# Patient Record
Sex: Male | Born: 1945 | Race: White | Hispanic: No | Marital: Married | Smoking: Former smoker
Health system: Southern US, Community
[De-identification: ages and names within clinical notes are randomized; demographics above are authoritative.]

## PROBLEM LIST (undated history)

## (undated) DIAGNOSIS — M1990 Unspecified osteoarthritis, unspecified site: Secondary | ICD-10-CM

## (undated) DIAGNOSIS — C189 Malignant neoplasm of colon, unspecified: Secondary | ICD-10-CM

## (undated) DIAGNOSIS — K5730 Diverticulosis of large intestine without perforation or abscess without bleeding: Secondary | ICD-10-CM

## (undated) DIAGNOSIS — D649 Anemia, unspecified: Secondary | ICD-10-CM

## (undated) DIAGNOSIS — R252 Cramp and spasm: Secondary | ICD-10-CM

## (undated) DIAGNOSIS — Z87442 Personal history of urinary calculi: Secondary | ICD-10-CM

## (undated) DIAGNOSIS — I10 Essential (primary) hypertension: Secondary | ICD-10-CM

## (undated) DIAGNOSIS — N400 Enlarged prostate without lower urinary tract symptoms: Secondary | ICD-10-CM

## (undated) DIAGNOSIS — I700 Atherosclerosis of aorta: Secondary | ICD-10-CM

## (undated) DIAGNOSIS — C801 Malignant (primary) neoplasm, unspecified: Secondary | ICD-10-CM

## (undated) DIAGNOSIS — N189 Chronic kidney disease, unspecified: Secondary | ICD-10-CM

## (undated) HISTORY — PX: APPENDECTOMY: SHX54

## (undated) HISTORY — PX: JOINT REPLACEMENT: SHX530

## (undated) HISTORY — DX: Malignant neoplasm of colon, unspecified: C18.9

## (undated) HISTORY — PX: TOTAL KNEE ARTHROPLASTY: SHX125

## (undated) HISTORY — PX: EYE MUSCLE SURGERY: SHX370

---

## 2004-03-03 ENCOUNTER — Ambulatory Visit: Payer: Self-pay | Admitting: Gastroenterology

## 2006-02-15 ENCOUNTER — Ambulatory Visit: Payer: Self-pay | Admitting: Urology

## 2007-01-06 ENCOUNTER — Ambulatory Visit: Payer: Self-pay | Admitting: Orthopaedic Surgery

## 2009-04-22 ENCOUNTER — Ambulatory Visit: Payer: Self-pay | Admitting: Gastroenterology

## 2010-02-18 ENCOUNTER — Ambulatory Visit: Payer: Self-pay

## 2010-03-24 ENCOUNTER — Ambulatory Visit: Payer: Self-pay | Admitting: Unknown Physician Specialty

## 2010-09-15 ENCOUNTER — Ambulatory Visit: Payer: Self-pay

## 2011-01-19 ENCOUNTER — Ambulatory Visit: Payer: Self-pay | Admitting: Internal Medicine

## 2011-11-25 ENCOUNTER — Ambulatory Visit: Payer: Self-pay | Admitting: General Practice

## 2011-11-25 LAB — URINALYSIS, COMPLETE
Blood: NEGATIVE
Glucose,UR: NEGATIVE mg/dL (ref 0–75)
Ketone: NEGATIVE
Leukocyte Esterase: NEGATIVE
Nitrite: NEGATIVE
Ph: 5 (ref 4.5–8.0)
Protein: NEGATIVE
RBC,UR: 1 /HPF (ref 0–5)
Specific Gravity: 1.021 (ref 1.003–1.030)
Squamous Epithelial: NONE SEEN

## 2011-11-25 LAB — BASIC METABOLIC PANEL
Anion Gap: 7 (ref 7–16)
BUN: 22 mg/dL — ABNORMAL HIGH (ref 7–18)
Calcium, Total: 9.1 mg/dL (ref 8.5–10.1)
Creatinine: 0.8 mg/dL (ref 0.60–1.30)
EGFR (African American): >60
EGFR (Non-African Amer.): >60
Glucose: 90 mg/dL (ref 65–99)
Osmolality: 288 (ref 275–301)
Potassium: 4.4 mmol/L (ref 3.5–5.1)
Sodium: 143 mmol/L (ref 136–145)

## 2011-11-25 LAB — MRSA PCR SCREENING

## 2011-11-25 LAB — PROTIME-INR
INR: 1
Prothrombin Time: 13.4 secs (ref 11.5–14.7)

## 2011-11-25 LAB — CBC
HCT: 42 % (ref 40.0–52.0)
MCH: 31.9 pg (ref 26.0–34.0)
MCHC: 34.1 g/dL (ref 32.0–36.0)
MCV: 94 fL (ref 80–100)
Platelet: 264 10*3/uL (ref 150–440)
RBC: 4.49 10*6/uL (ref 4.40–5.90)
RDW: 13.1 % (ref 11.5–14.5)
WBC: 6.3 10*3/uL (ref 3.8–10.6)

## 2011-11-25 LAB — SEDIMENTATION RATE: Erythrocyte Sed Rate: 1 mm/hr (ref 0–20)

## 2011-11-25 LAB — APTT: Activated PTT: 30.9 secs (ref 23.6–35.9)

## 2011-11-26 LAB — URINE CULTURE

## 2011-12-08 ENCOUNTER — Inpatient Hospital Stay: Payer: Self-pay | Admitting: General Practice

## 2011-12-09 LAB — BASIC METABOLIC PANEL WITH GFR
Anion Gap: 10
BUN: 18 mg/dL
Calcium, Total: 8.2 mg/dL — ABNORMAL LOW
Chloride: 107 mmol/L
Co2: 21 mmol/L
Creatinine: 1.05 mg/dL
EGFR (African American): >60
EGFR (Non-African Amer.): >60
Glucose: 231 mg/dL — ABNORMAL HIGH
Osmolality: 285
Potassium: 3.9 mmol/L
Sodium: 138 mmol/L

## 2011-12-09 LAB — PLATELET COUNT: Platelet: 212 x10 3/mm 3

## 2011-12-09 LAB — HEMOGLOBIN: HGB: 11.7 g/dL — ABNORMAL LOW

## 2011-12-10 LAB — BASIC METABOLIC PANEL
Anion Gap: 9 (ref 7–16)
BUN: 13 mg/dL (ref 7–18)
Calcium, Total: 7.8 mg/dL — ABNORMAL LOW (ref 8.5–10.1)
Chloride: 108 mmol/L — ABNORMAL HIGH (ref 98–107)
Co2: 24 mmol/L (ref 21–32)
Creatinine: 0.78 mg/dL (ref 0.60–1.30)
EGFR (African American): >60
EGFR (Non-African Amer.): >60
Glucose: 95 mg/dL (ref 65–99)
Osmolality: 281 (ref 275–301)
Potassium: 4.1 mmol/L (ref 3.5–5.1)
Sodium: 141 mmol/L (ref 136–145)

## 2011-12-10 LAB — PLATELET COUNT: Platelet: 173 10*3/uL (ref 150–440)

## 2011-12-10 LAB — HEMOGLOBIN: HGB: 10.5 g/dL — ABNORMAL LOW (ref 13.0–18.0)

## 2013-08-10 ENCOUNTER — Ambulatory Visit: Payer: Self-pay | Admitting: Family Medicine

## 2014-04-18 ENCOUNTER — Ambulatory Visit: Payer: Self-pay | Admitting: Gastroenterology

## 2014-05-21 NOTE — Op Note (Signed)
PATIENT NAME:  Leonard Stokes, Leonard Stokes MR#:  [id number] DATE OF BIRTH:  August 13, 1945  DATE OF PROCEDURE:  12/08/2011  PREOPERATIVE DIAGNOSIS: Degenerative arthrosis of the right knee.   POSTOPERATIVE DIAGNOSIS: Degenerative arthrosis of the right knee.   PROCEDURE PERFORMED: Right total knee arthroplasty using computer-assisted navigation.   SURGEON: Laurice Record. Holley Bouche., MD  ASSISTANT: Vance Peper, PA-C (required to maintain retraction throughout the procedure)   ANESTHESIA: Femoral nerve block and spinal.   ESTIMATED BLOOD LOSS: 150 mL.   FLUIDS REPLACED: 1300 mL of crystalloid.   TOURNIQUET TIME: 93 minutes.   DRAINS: Two medium drains to reinfusion system.   SOFT TISSUE RELEASES: Anterior cruciate ligament, posterior cruciate ligament, deep medial collateral ligament, and patellofemoral ligament.   IMPLANTS UTILIZED: DePuy PFC Sigma size 3 posterior stabilized femoral component (cemented), size 4 MBT tibial component (cemented), 35 mm three peg oval dome patella (cemented), and a 10 mm stabilized rotating platform polyethylene insert.   INDICATIONS FOR SURGERY: The patient is a 69 year old male who has been seen for complaints of progressive right knee pain. X-rays demonstrated severe degenerative changes in tricompartmental fashion with varus deformity. Did not see any significant improvement despite conservative nonsurgical intervention. After discussion of the risks and benefits of surgical intervention, the patient expressed his understanding of the risks and benefits and agreed with plans for surgical intervention.   PROCEDURE IN DETAIL: Patient was brought into the Operating Room and, after adequate femoral nerve block and spinal anesthesia was achieved, a tourniquet was placed on patient's upper right thigh. Patient's right knee and leg were cleaned and prepped with alcohol and DuraPrep, draped in the usual sterile fashion. A "timeout" was performed as per usual protocol. The right lower  extremity was exsanguinated using Esmarch, and tourniquet was inflated to 300 mmHg. An anterior longitudinal incision was made followed by a standard mid vastus approach. A large effusion was evacuated. The deep fibers of the medial collateral ligament were elevated in subperiosteal fashion off of the medial flare of the tibia so as to maintain a continuous soft tissue sleeve. The patella was subluxed laterally and the patellofemoral ligament was incised. Inspection of the knee demonstrated severe degenerative changes in tricompartmental fashion. There was full thickness loss of articular cartilage to the medial compartment. Prominent osteophytes were debrided using rongeur. Anterior and posterior cruciate ligaments were excised. Two 4.0 mm Schanz pins were inserted into the femur and into the tibia for attachment of the ray of trackers used for computer-assisted navigation. Hip center was identified using circumduction technique. Distal landmarks were mapped using computer. Distal femur and proximal tibia were mapped using computer. Distal femoral cutting guide was positioned using computer-assisted navigation so as to achieve a 5 degree distal valgus cut. Cut was performed and verified using the computer. The distal femur was sized and it was felt that a size 3 femoral component was appropriate. Size 3 cutting guide was positioned and the anterior cut was performed and verified using the computer. This was followed by completion of the posterior and chamfer cuts. Femoral cutting guide for the central box was then positioned and the central box cut was performed.   Attention was then directed to the proximal tibia. Medial and lateral menisci were excised. The extramedullary tibial cutting guide was positioned using computer-assisted navigation so as to achieve 0 degree varus valgus alignment and 0 degree posterior slope. Cut was performed and verified using the computer. Proximal tibia was sized and it was felt  that a size 4  tibial tray was appropriate. Tibial and femoral trials were inserted followed by insertion of a 10 mm polyethylene insert. Excellent mediolateral soft tissue balancing was appreciated both in extension and in flexion. Finally, the patella was cut and prepared so as to accommodate a 35 mm three peg oval dome patella. Patellar trial was placed and the knee was placed through a range of motion with excellent patellar tracking appreciated.   Osteophytes were debrided from the posterior condyles of the femur. The femoral component was removed. Central post hole for the tibial component was reamed followed by insertion of a keel punch. Tibial tray was then removed. Cut surfaces of bone were irrigated with copious amounts of normal aline with antibiotic solution using pulsatile lavage and then suctioned dry. Polymethyl methacrylate cement was prepared in the usual fashion using a vacuum mixer. Cement was applied to the cut surface of the proximal tibia as well as along the undersurface of a size 4 MBT tibial component. Tibial component was positioned and impacted into place. Excess cement was removed using freer elevators. Cement was then applied to the cut surfaces of the femur as well as along the posterior flanges of a size 3 posterior stabilized femoral component. Femoral component was positioned and impacted into place. Excess cement was removed using freer elevators. A 10 mm polyethylene trial was inserted and the knee was brought into full extension with steady axial compression applied. Finally, cement was applied to the backside of a 35 mm three peg oval dome patella and the patellar component was positioned and patellar clamp applied. Excess cement was removed using freer elevators.   After adequate curing of cement, tourniquet was deflated after total tourniquet time of 93 minutes. Hemostasis was achieved using electrocautery. The knee was irrigated with copious amounts of normal saline with  antibiotic solution using pulsatile lavage and then suctioned dry. The knee was inspected for any residual cement debris. 30 mL of 0.25% Marcaine with epinephrine was injected along the posterior capsule. A 10 mm stabilized rotating platform polyethylene insert was inserted and the knee was placed through a range of motion. Excellent patellar tracking was appreciated and excellent mediolateral soft tissue balancing was noted.   Two medium drains were placed in the wound bed and brought out through a separate stab incision to be attached to reinfusion system. The medial parapatellar portion of the incision was reapproximated using interrupted sutures of #1 Vicryl. Subcutaneous tissue was approximated in layers using first #0 Vicryl followed by 2-0 Vicryl. Skin was closed with skin staples. Sterile dressing was applied.   Patient tolerated procedure well. He was transported to the recovery room in stable condition.   ____________________________ Laurice Record. Holley Bouche., MD jph:cms D: 12/08/2011 20:50:42 ET T: 12/09/2011 11:01:25 ET JOB#: [id number]  cc: Laurice Record. Holley Bouche., MD, <Dictator> Laurice Record Holley Bouche MD ELECTRONICALLY SIGNED 12/15/2011 4:41

## 2014-05-21 NOTE — Discharge Summary (Signed)
PATIENT NAME:  Leonard Stokes, Leonard Stokes MR#:  [id number] DATE OF BIRTH:  [birth year]  DATE OF ADMISSION:  12/08/2011 DATE OF DISCHARGE:  12/11/2011  ADMITTING DIAGNOSIS: Degenerative arthrosis of the right knee.   DISCHARGE DIAGNOSIS: Degenerative arthrosis of the right knee.   HISTORY: The patient is a 69 year old who has been followed at [facility] for progression of right knee pain. The patient states that his symptoms began greater than 12 months ago after he did a significant amount of walking during a trip to [place]. He subsequently underwent a right knee arthroscopy for partial medial and lateral meniscectomy performed by Dr. Albesa Seen. The patient continued to have intermittent discomfort to the right knee. He states that the pain was noted to be aggravated with weight-bearing activities. He states that he had some limitation in his distance walking secondary to increased pain. On occasion the patient had reported some near giving way of the knee as well as swelling of the knee. He also had reported some "catching" of the right knee at night. The patient occasionally has been using oxycodone for pain control. The patient had not seen any significant improvement in his condition despite intraarticular cortisone injections. X-rays taken in [place] showed narrowing of the medial cartilage space with associated varus alignment. He was also noted to have osteophyte as well as subchondral sclerosis. After discussion of the risks and benefits of surgical intervention, the patient expressed his understanding of the risks and benefits and agreed with plans for surgical intervention.   PROCEDURE: Right total knee arthroplasty using computer-assisted navigation.   ANESTHESIA: Femoral nerve block with spinal.   SOFT TISSUE RELEASE: Anterior cruciate ligament, posterior cruciate ligament, deep and medial collateral ligament, as well as patellofemoral ligament.   IMPLANTS UTILIZED: DePuy PFC  Sigma size three posterior stabilized femoral component (cemented), size four MBT tibial component (cemented), 35-mm three pegged oval dome patella (cemented), and a 10-mm stabilized rotating platform polyethylene insert.   HOSPITAL COURSE: The patient tolerated the procedure very well. He had no complications following surgery. He was taken to the PAC-U where he was stabilized and then transferred to the orthopedic floor. The patient began receiving anticoagulation therapy of Lovenox 30 mg subcutaneous every 12 hours per anesthesia and pharmacy protocol. He was fitted with TED stockings bilaterally. These were allowed to be removed one hour per eight-hour shift. The right one was applied on day two following removal of the Hemovac and dressing change. The patient was also fitted with the AV-I compression foot pumps bilaterally set at 80 mmHg. There has been no evidence of any deep venous thromboses of the lower extremities. Calves have been nontender. Negative Homans sign. Heels were elevated off the bed using rolled towels.   The patient is denying any chest pain or shortness of breath. Vital signs have been stable. He has been afebrile. Hemodynamically he was stable and no transfusions were given other than the Autovac transfusion given the first six hours postoperatively. Laboratory studies were for the most part within acceptable limits.   Physical therapy was initiated on day one for gait training and transfers. He has done very well. Upon being discharged he was ambulating greater than 200 feet. He was able go up and down four sets of steps. He was independent with bed to chair transfers. Occupational therapy was also initiated on day one for activities of daily living and assistive devices.   The patient's IV, Foley, and Hemovac were discontinued on day two along with dressing  change. The wound was free of any drainage or signs of infection. The Polar Care was reapplied to the surgical leg  maintaining a temperature of 40 to 50 degrees Fahrenheit.   DISPOSITION: The patient is discharged to home in improved stable condition.   DISCHARGE INSTRUCTIONS:  1. He may weight bear as tolerated. Continue using a walker until cleared by physical therapy to go to a quad cane.  2. He will receive Home Health physical therapy for two weeks after which time he will be referred to outpatient physical therapy.  3. He has a follow-up appointment at [facility] in two weeks.  4. He is to continue with TED stockings. These are to be worn during the day but may be removed at night.  5. Continue Polar Care maintaining a temperature of 40 to 50 degrees Fahrenheit. Recommend that he try to use this as much as he can around-the-clock until in the office.  6. He is placed on a regular diet.  7. He will resume his regular medications that he was on prior to admission.  8. A prescription for Lovenox 40 mg subcutaneously daily for 14 days, then discontinue and begin taking one 81-mg enteric-coated aspirin, was given as well as Roxicodone 5 to 10 mg every 4 to 6 hours p.r.n. for pain and Ultram 50 to 100 mg every 4 to 6 hours p.r.n. for pain.   PAST MEDICAL HISTORY:  1. Acid reflux.  2. Seasonal allergies.  3. Chickenpox.  4. Hospitalized for bleeding after colonoscopy.   ____________________________ Vance Peper, PA jrw:bjt D: 12/14/2011 12:31:49 ET T: 12/15/2011 09:59:16 ET JOB#: [id number]  cc: Vance Peper, PA, <Dictator> Rheannon Cerney PA ELECTRONICALLY SIGNED 12/15/2011 20:49

## 2014-09-24 ENCOUNTER — Telehealth: Payer: Self-pay | Admitting: *Deleted

## 2014-09-24 NOTE — Telephone Encounter (Signed)
  Oncology Nurse Navigator Documentation    Navigator Encounter Type: Telephone;Screening (09/24/14 1500)               Notified patient that annual lung cancer screening low dose CT scan is due. Confirmed that patient is within the age range of 55-77, and asymptomatic, (no signs or symptoms of lung cancer). The patient is a former smoker, with a 60 pack year history. Quit date 15 years ago. Shared decision making visit was done 08/10/13. Patient is agreeable to scheduling of CT scan.

## 2014-09-26 ENCOUNTER — Other Ambulatory Visit: Payer: Self-pay | Admitting: Family Medicine

## 2014-09-26 DIAGNOSIS — Z87891 Personal history of nicotine dependence: Secondary | ICD-10-CM | POA: Insufficient documentation

## 2014-10-02 ENCOUNTER — Ambulatory Visit
Admission: RE | Admit: 2014-10-02 | Discharge: 2014-10-02 | Disposition: A | Discharge status: Home / Self Care | Payer: PPO | Source: Ambulatory Visit | Attending: Family Medicine | Admitting: Family Medicine

## 2014-10-02 DIAGNOSIS — Z87891 Personal history of nicotine dependence: Secondary | ICD-10-CM | POA: Insufficient documentation

## 2014-10-02 DIAGNOSIS — J439 Emphysema, unspecified: Secondary | ICD-10-CM | POA: Insufficient documentation

## 2014-10-02 DIAGNOSIS — I2510 Atherosclerotic heart disease of native coronary artery without angina pectoris: Secondary | ICD-10-CM | POA: Insufficient documentation

## 2014-10-02 DIAGNOSIS — N200 Calculus of kidney: Secondary | ICD-10-CM | POA: Insufficient documentation

## 2014-10-04 ENCOUNTER — Telehealth: Payer: Self-pay | Admitting: *Deleted

## 2014-10-04 NOTE — Telephone Encounter (Signed)
  Oncology Nurse Navigator Documentation    Navigator Encounter Type: Telephone (10/04/14 1000)                      Time Spent with Patient: 15 (10/04/14 1000)   Oncology Nurse Navigator Documentation  Notified patient of LDCT lung cancer screening results of Lung Rads 2  finding with recommendation for 12 month follow up imaging to be further evaluated related to 15 years since smoking cessation. Also notified of incidental finding noted below. Patient verbalizes understanding.  1. Lung-RADS Category 2, benign appearance or behavior. Continue annual screening with low-dose chest CT without contrast in 12 months. Centrilobular emphysema within unchanged subpleural 2 mm right lower lobe nodule. 2. Atherosclerosis, including within the coronary arteries. 3. Left nephrolithiasis.

## 2015-01-07 ENCOUNTER — Other Ambulatory Visit: Payer: Self-pay | Admitting: Orthopedic Surgery

## 2015-01-07 DIAGNOSIS — M2392 Unspecified internal derangement of left knee: Secondary | ICD-10-CM

## 2015-01-29 ENCOUNTER — Ambulatory Visit
Admission: RE | Admit: 2015-01-29 | Discharge: 2015-01-29 | Disposition: A | Discharge status: Home / Self Care | Payer: PPO | Source: Ambulatory Visit | Attending: Orthopedic Surgery | Admitting: Orthopedic Surgery

## 2015-01-29 DIAGNOSIS — X58XXXA Exposure to other specified factors, initial encounter: Secondary | ICD-10-CM | POA: Diagnosis not present

## 2015-01-29 DIAGNOSIS — M2392 Unspecified internal derangement of left knee: Secondary | ICD-10-CM | POA: Insufficient documentation

## 2015-01-29 DIAGNOSIS — S83242A Other tear of medial meniscus, current injury, left knee, initial encounter: Secondary | ICD-10-CM | POA: Insufficient documentation

## 2015-02-07 DIAGNOSIS — S83232D Complex tear of medial meniscus, current injury, left knee, subsequent encounter: Secondary | ICD-10-CM | POA: Diagnosis not present

## 2015-02-07 DIAGNOSIS — M1712 Unilateral primary osteoarthritis, left knee: Secondary | ICD-10-CM | POA: Diagnosis not present

## 2015-03-13 DIAGNOSIS — S83232D Complex tear of medial meniscus, current injury, left knee, subsequent encounter: Secondary | ICD-10-CM | POA: Diagnosis not present

## 2015-03-13 DIAGNOSIS — M1712 Unilateral primary osteoarthritis, left knee: Secondary | ICD-10-CM | POA: Diagnosis not present

## 2015-05-05 ENCOUNTER — Other Ambulatory Visit: Payer: Self-pay | Admitting: Family Medicine

## 2015-05-05 DIAGNOSIS — R221 Localized swelling, mass and lump, neck: Secondary | ICD-10-CM | POA: Diagnosis not present

## 2015-05-08 ENCOUNTER — Ambulatory Visit: Payer: PPO

## 2015-05-12 ENCOUNTER — Ambulatory Visit
Admission: RE | Admit: 2015-05-12 | Discharge: 2015-05-12 | Disposition: A | Discharge status: Home / Self Care | Payer: PPO | Source: Ambulatory Visit | Attending: Family Medicine | Admitting: Family Medicine

## 2015-05-12 DIAGNOSIS — R590 Localized enlarged lymph nodes: Secondary | ICD-10-CM | POA: Insufficient documentation

## 2015-05-12 DIAGNOSIS — R221 Localized swelling, mass and lump, neck: Secondary | ICD-10-CM | POA: Insufficient documentation

## 2015-07-07 DIAGNOSIS — L570 Actinic keratosis: Secondary | ICD-10-CM | POA: Diagnosis not present

## 2015-07-07 DIAGNOSIS — R591 Generalized enlarged lymph nodes: Secondary | ICD-10-CM | POA: Diagnosis not present

## 2015-07-23 DIAGNOSIS — M1712 Unilateral primary osteoarthritis, left knee: Secondary | ICD-10-CM | POA: Diagnosis not present

## 2015-07-23 DIAGNOSIS — S83232D Complex tear of medial meniscus, current injury, left knee, subsequent encounter: Secondary | ICD-10-CM | POA: Diagnosis not present

## 2015-09-26 DIAGNOSIS — L570 Actinic keratosis: Secondary | ICD-10-CM | POA: Diagnosis not present

## 2015-09-26 DIAGNOSIS — D408 Neoplasm of uncertain behavior of other specified male genital organs: Secondary | ICD-10-CM | POA: Diagnosis not present

## 2015-09-26 DIAGNOSIS — D485 Neoplasm of uncertain behavior of skin: Secondary | ICD-10-CM | POA: Diagnosis not present

## 2015-09-26 DIAGNOSIS — L988 Other specified disorders of the skin and subcutaneous tissue: Secondary | ICD-10-CM | POA: Diagnosis not present

## 2015-09-26 DIAGNOSIS — L728 Other follicular cysts of the skin and subcutaneous tissue: Secondary | ICD-10-CM | POA: Diagnosis not present

## 2015-09-26 DIAGNOSIS — X32XXXA Exposure to sunlight, initial encounter: Secondary | ICD-10-CM | POA: Diagnosis not present

## 2015-09-26 DIAGNOSIS — D044 Carcinoma in situ of skin of scalp and neck: Secondary | ICD-10-CM | POA: Diagnosis not present

## 2015-10-01 DIAGNOSIS — D044 Carcinoma in situ of skin of scalp and neck: Secondary | ICD-10-CM | POA: Diagnosis not present

## 2015-12-02 DIAGNOSIS — M1712 Unilateral primary osteoarthritis, left knee: Secondary | ICD-10-CM | POA: Diagnosis not present

## 2015-12-17 DIAGNOSIS — M1712 Unilateral primary osteoarthritis, left knee: Secondary | ICD-10-CM | POA: Diagnosis not present

## 2015-12-17 DIAGNOSIS — M25562 Pain in left knee: Secondary | ICD-10-CM | POA: Diagnosis not present

## 2015-12-30 DIAGNOSIS — Z13220 Encounter for screening for lipoid disorders: Secondary | ICD-10-CM | POA: Diagnosis not present

## 2015-12-30 DIAGNOSIS — Z131 Encounter for screening for diabetes mellitus: Secondary | ICD-10-CM | POA: Diagnosis not present

## 2015-12-30 DIAGNOSIS — Z125 Encounter for screening for malignant neoplasm of prostate: Secondary | ICD-10-CM | POA: Diagnosis not present

## 2016-01-08 DIAGNOSIS — Z0000 Encounter for general adult medical examination without abnormal findings: Secondary | ICD-10-CM | POA: Diagnosis not present

## 2016-01-27 DIAGNOSIS — M1712 Unilateral primary osteoarthritis, left knee: Secondary | ICD-10-CM | POA: Diagnosis not present

## 2016-02-02 DIAGNOSIS — I700 Atherosclerosis of aorta: Secondary | ICD-10-CM

## 2016-02-02 HISTORY — DX: Atherosclerosis of aorta: I70.0

## 2016-02-03 DIAGNOSIS — M1712 Unilateral primary osteoarthritis, left knee: Secondary | ICD-10-CM | POA: Diagnosis not present

## 2016-02-04 DIAGNOSIS — Z85828 Personal history of other malignant neoplasm of skin: Secondary | ICD-10-CM | POA: Diagnosis not present

## 2016-02-04 DIAGNOSIS — D2271 Melanocytic nevi of right lower limb, including hip: Secondary | ICD-10-CM | POA: Diagnosis not present

## 2016-02-04 DIAGNOSIS — D2261 Melanocytic nevi of right upper limb, including shoulder: Secondary | ICD-10-CM | POA: Diagnosis not present

## 2016-02-04 DIAGNOSIS — D225 Melanocytic nevi of trunk: Secondary | ICD-10-CM | POA: Diagnosis not present

## 2016-02-04 DIAGNOSIS — L570 Actinic keratosis: Secondary | ICD-10-CM | POA: Diagnosis not present

## 2016-02-04 DIAGNOSIS — X32XXXA Exposure to sunlight, initial encounter: Secondary | ICD-10-CM | POA: Diagnosis not present

## 2016-02-10 DIAGNOSIS — M1712 Unilateral primary osteoarthritis, left knee: Secondary | ICD-10-CM | POA: Diagnosis not present

## 2016-05-31 ENCOUNTER — Encounter: Payer: Self-pay | Admitting: Urology

## 2016-05-31 ENCOUNTER — Ambulatory Visit (INDEPENDENT_AMBULATORY_CARE_PROVIDER_SITE_OTHER): Payer: PPO | Admitting: Urology

## 2016-05-31 VITALS — BP 143/81 | HR 74 | Ht 72.0 in | Wt 196.0 lb

## 2016-05-31 DIAGNOSIS — R39198 Other difficulties with micturition: Secondary | ICD-10-CM | POA: Diagnosis not present

## 2016-05-31 DIAGNOSIS — N401 Enlarged prostate with lower urinary tract symptoms: Secondary | ICD-10-CM | POA: Diagnosis not present

## 2016-05-31 DIAGNOSIS — N138 Other obstructive and reflux uropathy: Secondary | ICD-10-CM

## 2016-05-31 DIAGNOSIS — N5201 Erectile dysfunction due to arterial insufficiency: Secondary | ICD-10-CM | POA: Diagnosis not present

## 2016-05-31 LAB — URINALYSIS, COMPLETE
Bilirubin, UA: NEGATIVE
GLUCOSE, UA: NEGATIVE
Ketones, UA: NEGATIVE
LEUKOCYTES UA: NEGATIVE
Nitrite, UA: NEGATIVE
PH UA: 5.5 (ref 5.0–7.5)
PROTEIN UA: NEGATIVE
RBC UA: NEGATIVE
SPEC GRAV UA: 1.02 (ref 1.005–1.030)
Urobilinogen, Ur: 0.2 mg/dL (ref 0.2–1.0)

## 2016-05-31 LAB — BLADDER SCAN AMB NON-IMAGING: Scan Result: 109

## 2016-05-31 NOTE — Progress Notes (Addendum)
05/31/2016 9:15 AM   Leonard Stokes 1945-08-09 [id number]  Referring provider: Derinda Late, MD [phone] [facility] [place], [place] [address]  Chief Complaint  Patient presents with  . Erectile Dysfunction    new patient     HPI: Consultation for ED. Pt with trouble getting and maintaining an erection. SHIM 17. He's tried Viagra, Cialis and sildenafil. He also tried daily Cialis. Symptoms worse. He has a good libido. He has two kids.   He has a h/o BPH on tamsulosin. AUASS = 21, QOL mixed. Symptoms of frequency, inc emptying, intermittent flow, weak stream, straining. Noc x 1. Saw Dr. Jacqlyn Larsen and [facility] in past. His Nov 2017 PSA was 3.35. Symptoms stable. His Dad had PCa but died at 9 yo from other causes.   UA clear. PVR 108 mL.    PMH: No past medical history on file.  Surgical History: No past surgical history on file.  Home Medications:  Allergies as of 05/31/2016      Reactions   Codeine Rash      Medication List       Accurate as of 05/31/16  9:15 AM. Always use your most recent med list.          diclofenac sodium 1 % Gel Commonly known as:  VOLTAREN Apply topically 4 (four) times daily.   ferrous sulfate 325 (65 FE) MG tablet Take 325 mg by mouth daily with breakfast.   losartan-hydrochlorothiazide 50-12.5 MG tablet Commonly known as:  HYZAAR   magnesium 30 MG tablet Take 30 mg by mouth 2 (two) times daily.   meloxicam 7.5 MG tablet Commonly known as:  MOBIC Take by mouth.   sildenafil 20 MG tablet Commonly known as:  REVATIO Take 20 mg by mouth 3 (three) times daily.   tamsulosin 0.4 MG Caps capsule Commonly known as:  FLOMAX   TURMERIC CURCUMIN PO Take by mouth.   Vitamin D (Ergocalciferol) 50000 units Caps capsule Commonly known as:  DRISDOL Take 50,000 Units by mouth every 7 (seven) days.   zolpidem 10 MG tablet Commonly known as:  AMBIEN TAKE 1/2 TO 1 TABLET BY MOUTH AT BEDTIMEAS  NEEDED       Allergies:  Allergies  Allergen Reactions  . Codeine Rash    Family History: No family history on file.  Social History:  reports that he quit smoking about 20 years ago. He has never used smokeless tobacco. He reports that he drinks alcohol. He reports that he does not use drugs.  ROS: UROLOGY Frequent Urination?: No Hard to postpone urination?: No Burning/pain with urination?: No Get up at night to urinate?: Yes Leakage of urine?: No Urine stream starts and stops?: Yes Trouble starting stream?: Yes Do you have to strain to urinate?: Yes Blood in urine?: No Urinary tract infection?: No Sexually transmitted disease?: No Injury to kidneys or bladder?: No Painful intercourse?: No Weak stream?: Yes Erection problems?: Yes Penile pain?: No  Gastrointestinal Nausea?: No Vomiting?: No Indigestion/heartburn?: No Diarrhea?: No Constipation?: No  Constitutional Fever: No Night sweats?: No Weight loss?: No Fatigue?: No  Skin Skin rash/lesions?: No Itching?: No  Eyes Blurred vision?: No Double vision?: No  Ears/Nose/Throat Sore throat?: No Sinus problems?: No  Hematologic/Lymphatic Swollen glands?: No Easy bruising?: No  Cardiovascular Leg swelling?: No Chest pain?: No  Respiratory Cough?: No Shortness of breath?: No  Endocrine Excessive thirst?: No  Musculoskeletal Back pain?: No Joint pain?: Yes  Neurological Headaches?:  No Dizziness?: No  Psychologic Depression?: No Anxiety?: No  Physical Exam: BP (!) 143/81   Pulse 74   Ht 6' (1.829 m)   Wt 88.9 kg (196 lb)   BMI 26.58 kg/m   Constitutional:  Alert and oriented, No acute distress. HEENT: [place] AT, moist mucus membranes.  Trachea midline, no masses. Cardiovascular: No clubbing, cyanosis, or edema. Respiratory: Normal respiratory effort, no increased work of breathing. GI: Abdomen is soft, nontender, nondistended, no abdominal masses GU: No CVA tenderness.  Penis -  circumcised, normal Scrotum - normal, a small sebaceous cyst (5 mm) on the left and right posterior. Testicles - descended bilaterally, palpably normal.  DRE- prostate about 50 grams, smooth, no hard area or nodules.  Skin: No rashes, bruises or suspicious lesions. Lymph: No cervical or inguinal adenopathy. Neurologic: Grossly intact, no focal deficits, moving all 4 extremities. Psychiatric: Normal mood and affect.  Laboratory Data: Lab Results  Component Value Date   WBC 6.3 11/25/2011   HGB 10.5 (L) 12/10/2011   HCT 42.0 11/25/2011   MCV 94 11/25/2011   PLT 173 12/10/2011    Lab Results  Component Value Date   CREATININE 0.78 12/10/2011    No results found for: PSA  No results found for: TESTOSTERONE  No results found for: HGBA1C  Urinalysis    Component Value Date/Time   COLORURINE Yellow 11/25/2011 1019   APPEARANCEUR Hazy 11/25/2011 1019   LABSPEC 1.021 11/25/2011 1019   PHURINE 5.0 11/25/2011 1019   GLUCOSEU Negative 11/25/2011 1019   HGBUR Negative 11/25/2011 1019   BILIRUBINUR Negative 11/25/2011 1019   KETONESUR Negative 11/25/2011 1019   PROTEINUR Negative 11/25/2011 1019   NITRITE Negative 11/25/2011 1019   LEUKOCYTESUR Negative 11/25/2011 1019     Assessment & Plan:    1. ED - discussed nature r/b of PDE5i, injections, Muse and VED. He'll try sildenafil 100 mg.   2. BPH with LUTS - discussed nature r/b of alpha blocker, increasing tams to 0.8, adding 5ARI (pt gives platelets and cant take one) and procedures. He'll continue tamsulosin 0.4. Another daily Cialis trial is a good option.  3. PCa screening - PSA up some. Will recheck. DRE normal today.   - Urinalysis, Complete - Bladder Scan (Post Void Residual) in office   No Follow-up on file.  Festus Aloe, [facility] [address], [place], [place] [address] [phone]

## 2016-06-11 DIAGNOSIS — M25562 Pain in left knee: Secondary | ICD-10-CM | POA: Diagnosis not present

## 2016-06-11 DIAGNOSIS — M1712 Unilateral primary osteoarthritis, left knee: Secondary | ICD-10-CM | POA: Diagnosis not present

## 2016-08-01 DIAGNOSIS — N189 Chronic kidney disease, unspecified: Secondary | ICD-10-CM

## 2016-08-01 HISTORY — DX: Chronic kidney disease, unspecified: N18.9

## 2016-08-03 DIAGNOSIS — M1712 Unilateral primary osteoarthritis, left knee: Secondary | ICD-10-CM | POA: Diagnosis not present

## 2016-08-10 ENCOUNTER — Emergency Department
Admission: EM | Admit: 2016-08-10 | Discharge: 2016-08-11 | Disposition: A | Discharge status: Home / Self Care | Payer: PPO | Attending: Emergency Medicine | Admitting: Emergency Medicine

## 2016-08-10 ENCOUNTER — Encounter: Payer: Self-pay | Admitting: Emergency Medicine

## 2016-08-10 DIAGNOSIS — Z87891 Personal history of nicotine dependence: Secondary | ICD-10-CM | POA: Insufficient documentation

## 2016-08-10 DIAGNOSIS — R197 Diarrhea, unspecified: Secondary | ICD-10-CM | POA: Diagnosis not present

## 2016-08-10 DIAGNOSIS — R109 Unspecified abdominal pain: Secondary | ICD-10-CM | POA: Diagnosis not present

## 2016-08-10 DIAGNOSIS — Z79899 Other long term (current) drug therapy: Secondary | ICD-10-CM | POA: Insufficient documentation

## 2016-08-10 DIAGNOSIS — R1031 Right lower quadrant pain: Secondary | ICD-10-CM | POA: Diagnosis present

## 2016-08-10 DIAGNOSIS — N200 Calculus of kidney: Secondary | ICD-10-CM | POA: Diagnosis not present

## 2016-08-10 DIAGNOSIS — M1712 Unilateral primary osteoarthritis, left knee: Secondary | ICD-10-CM | POA: Diagnosis not present

## 2016-08-10 LAB — CBC
HEMATOCRIT: 38.9 % — AB (ref 40.0–52.0)
Hemoglobin: 13.6 g/dL (ref 13.0–18.0)
MCH: 33.1 pg (ref 26.0–34.0)
MCHC: 35.1 g/dL (ref 32.0–36.0)
MCV: 94.6 fL (ref 80.0–100.0)
PLATELETS: 211 10*3/uL (ref 150–440)
RBC: 4.11 MIL/uL — AB (ref 4.40–5.90)
RDW: 13.6 % (ref 11.5–14.5)
WBC: 10.8 10*3/uL — ABNORMAL HIGH (ref 3.8–10.6)

## 2016-08-10 LAB — URINALYSIS, COMPLETE (UACMP) WITH MICROSCOPIC
BACTERIA UA: NONE SEEN
Bilirubin Urine: NEGATIVE
Glucose, UA: NEGATIVE mg/dL
Hgb urine dipstick: NEGATIVE
KETONES UR: 5 mg/dL — AB
Leukocytes, UA: NEGATIVE
Nitrite: NEGATIVE
PH: 6 (ref 5.0–8.0)
PROTEIN: NEGATIVE mg/dL
SQUAMOUS EPITHELIAL / LPF: NONE SEEN
Specific Gravity, Urine: 1.017 (ref 1.005–1.030)

## 2016-08-10 LAB — COMPREHENSIVE METABOLIC PANEL
ALT: 29 U/L (ref 17–63)
AST: 23 U/L (ref 15–41)
Albumin: 4.2 g/dL (ref 3.5–5.0)
Alkaline Phosphatase: 67 U/L (ref 38–126)
Anion gap: 9 (ref 5–15)
BUN: 24 mg/dL — AB (ref 6–20)
CHLORIDE: 101 mmol/L (ref 101–111)
CO2: 28 mmol/L (ref 22–32)
CREATININE: 1.48 mg/dL — AB (ref 0.61–1.24)
Calcium: 9.4 mg/dL (ref 8.9–10.3)
GFR calc non Af Amer: 46 mL/min — ABNORMAL LOW (ref >60)
GFR, EST AFRICAN AMERICAN: 53 mL/min — AB (ref >60)
Glucose, Bld: 131 mg/dL — ABNORMAL HIGH (ref 65–99)
Potassium: 4.7 mmol/L (ref 3.5–5.1)
SODIUM: 138 mmol/L (ref 135–145)
Total Bilirubin: 1 mg/dL (ref 0.3–1.2)
Total Protein: 7.5 g/dL (ref 6.5–8.1)

## 2016-08-10 LAB — LIPASE, BLOOD: LIPASE: 27 U/L (ref 11–51)

## 2016-08-10 MED ORDER — IOPAMIDOL (ISOVUE-300) INJECTION 61%
30.0000 mL | Freq: Once | INTRAVENOUS | Status: AC
Start: 2016-08-11 — End: 2016-08-10
  Administered 2016-08-10: 30 mL via ORAL

## 2016-08-10 MED ORDER — OXYCODONE-ACETAMINOPHEN 5-325 MG PO TABS
1.0000 | ORAL_TABLET | ORAL | Status: AC | PRN
  Administered 2016-08-10 – 2016-08-11 (×2): 1 via ORAL
  Filled 2016-08-10 (×2): qty 1

## 2016-08-10 NOTE — ED Triage Notes (Signed)
Patient to ER for c/o right flank pain with intermittent right lower abd pain. Denies nausea, but reports loss of appetite. Thought it may be constipation, has had two episodes of Magnesium Citrate and feels like he was successful with doses. Patient denies h/o kidney stones.

## 2016-08-10 NOTE — ED Provider Notes (Signed)
[facility] Emergency Department Provider Note  ____________________________________________   I have reviewed the triage vital signs and the nursing notes.   HISTORY  Chief Complaint Abdominal Pain and Flank Pain   History limited by: Not Limited   HPI Leonard Stokes is a 71 y.o. male who presents to the emergency department today because of concerns for abdominal pain. It is located in the right flank. It is been going on for the past few days. It is intermittent. It does radiate towards the front. The first occurred he did have some vomiting however has not had any since. He didn't think he might have been constipated so tried enemas and laxatives at home with good stool output however no resolution of the pain. He denies any fevers. Denies similar symptoms in the past. Denies any travel or unusual ingestion.   History reviewed. No pertinent past medical history.  Patient Active Problem List   Diagnosis Date Noted  . Personal history of tobacco use, presenting hazards to health 09/26/2014    Past Surgical History:  Procedure Laterality Date  . APPENDECTOMY    . TOTAL KNEE ARTHROPLASTY Right     Prior to Admission medications   Medication Sig Start Date End Date Taking? Authorizing Provider  diclofenac sodium (VOLTAREN) 1 % GEL Apply topically 4 (four) times daily.    [provider]  ferrous sulfate 325 (65 FE) MG tablet Take 325 mg by mouth daily with breakfast.    [provider]  losartan-hydrochlorothiazide (HYZAAR) 50-12.5 MG tablet  04/27/16   [provider]  magnesium 30 MG tablet Take 30 mg by mouth 2 (two) times daily.    [provider]  sildenafil (REVATIO) 20 MG tablet Take 20 mg by mouth 3 (three) times daily.    [provider]  tamsulosin (FLOMAX) 0.4 MG CAPS capsule  03/09/16   [provider]  TURMERIC CURCUMIN PO Take by mouth.    [provider]  Vitamin D,  Ergocalciferol, (DRISDOL) 50000 units CAPS capsule Take 50,000 Units by mouth every 7 (seven) days.    [provider]  zolpidem (AMBIEN) 10 MG tablet TAKE 1/2 TO 1 TABLET BY MOUTH AT [facility] NEEDED 03/22/16   [provider]    Allergies Codeine  No family history on file.  Social History Social History  Substance Use Topics  . Smoking status: Former Smoker    Quit date: 05/31/1996  . Smokeless tobacco: Never Used  . Alcohol use Yes    Review of Systems Constitutional: No fever/chills Eyes: No visual changes. ENT: No sore throat. Cardiovascular: Denies chest pain. Respiratory: Denies shortness of breath. Gastrointestinal: Positive for right flank pain. Genitourinary: Negative for dysuria. Musculoskeletal: Negative for back pain. Skin: Negative for rash. Neurological: Negative for headaches, focal weakness or numbness.  ____________________________________________   PHYSICAL EXAM:  VITAL SIGNS: ED Triage Vitals [08/10/16 1958]  Enc Vitals Group     BP (!) 152/83     Pulse Rate 81     Resp 16     Temp 98.5 F (36.9 C)     Temp src      SpO2 100 %     Weight 190 lb (86.2 kg)     Height 6' (1.829 m)     Head Circumference      Peak Flow      Pain Score 8   Constitutional: Alert and oriented. Well appearing and in no distress. Eyes: Conjunctivae are normal.  ENT  Head: Normocephalic and atraumatic.   Nose: No congestion/rhinnorhea.   Mouth/Throat: Mucous membranes are moist.   Neck: No stridor. Hematological/Lymphatic/Immunilogical: No cervical lymphadenopathy. Cardiovascular: Normal rate, regular rhythm.  No murmurs, rubs, or gallops.  Respiratory: Normal respiratory effort without tachypnea nor retractions. Breath sounds are clear and equal bilaterally. No wheezes/rales/rhonchi. Gastrointestinal: Soft and non tender. No rebound. No guarding.  Genitourinary: Deferred Musculoskeletal: Normal range of motion in all extremities.  No lower extremity edema. Neurologic:  Normal speech and language. No gross focal neurologic deficits are appreciated.  Skin:  Skin is warm, dry and intact. No rash noted. Psychiatric: Mood and affect are normal. Speech and behavior are normal. Patient exhibits appropriate insight and judgment.  ____________________________________________    LABS (pertinent positives/negatives)  Labs Reviewed  COMPREHENSIVE METABOLIC PANEL - Abnormal; Notable for the following:       Result Value   Glucose, Bld 131 (*)    BUN 24 (*)    Creatinine, Ser 1.48 (*)    GFR calc non Af Amer 46 (*)    GFR calc Af Amer 53 (*)    All other components within normal limits  CBC - Abnormal; Notable for the following:    WBC 10.8 (*)    RBC 4.11 (*)    HCT 38.9 (*)    All other components within normal limits  URINALYSIS, COMPLETE (UACMP) WITH MICROSCOPIC - Abnormal; Notable for the following:    Color, Urine YELLOW (*)    APPearance CLEAR (*)    Ketones, ur 5 (*)    All other components within normal limits  LIPASE, BLOOD     ____________________________________________   EKG  None  ____________________________________________    RADIOLOGY  CT abd/pel IMPRESSION:  1. A 1 cm right UPJ stone with mild right hydronephrosis.  Correlation with urinalysis recommended to exclude superimposed UTI.  2. A 6 mm nonobstructing left renal upper pole calculus. No  hydronephrosis.  3. Sigmoid diverticulosis.  4. Diarrheal state. Correlation with clinical exam and stool  cultures recommended. No bowel obstruction.  5. Aortic Atherosclerosis (ICD10-I70.0).    ____________________________________________   PROCEDURES  Procedures  ____________________________________________   INITIAL IMPRESSION / ASSESSMENT AND PLAN / ED COURSE  Pertinent labs & imaging results that were available during my care of the patient were reviewed by me and considered in my medical decision making (see chart for  details).  Patient presented to the emergency department today because of concerns for intermittent right flank pain. The patient's CT scan does show a 1 cm kidney stone. The patient's pain is controlled with oral pain medication. Will have patient follow up with urology. Discussed return precautions.  ____________________________________________   FINAL CLINICAL IMPRESSION(S) / ED DIAGNOSES  Final diagnoses:  Kidney stone  Flank pain     Note: This dictation was prepared with Dragon dictation. Any transcriptional errors that result from this process are unintentional     Nance Pear, MD 08/11/16 [phone]

## 2016-08-11 ENCOUNTER — Emergency Department: Payer: PPO

## 2016-08-11 DIAGNOSIS — R197 Diarrhea, unspecified: Secondary | ICD-10-CM | POA: Diagnosis not present

## 2016-08-11 MED ORDER — OXYCODONE-ACETAMINOPHEN 5-325 MG PO TABS: 1.0000 | ORAL_TABLET | ORAL | 0 refills | Status: DC | PRN

## 2016-08-11 MED ORDER — OXYCODONE-ACETAMINOPHEN 5-325 MG PO TABS: 1.0000 | ORAL_TABLET | ORAL | Status: DC | PRN

## 2016-08-11 MED ORDER — IOPAMIDOL (ISOVUE-300) INJECTION 61%
75.0000 mL | Freq: Once | INTRAVENOUS | Status: AC | PRN
  Administered 2016-08-11: 75 mL via INTRAVENOUS

## 2016-08-11 NOTE — Discharge Instructions (Signed)
Please seek medical attention for any high fevers, chest pain, shortness of breath, change in behavior, persistent vomiting, bloody stool or any other new or concerning symptoms.  

## 2016-08-11 NOTE — ED Notes (Signed)
Patient transported to CT 

## 2016-08-17 DIAGNOSIS — M1712 Unilateral primary osteoarthritis, left knee: Secondary | ICD-10-CM | POA: Diagnosis not present

## 2016-08-18 ENCOUNTER — Telehealth: Payer: Self-pay | Admitting: Radiology

## 2016-08-18 ENCOUNTER — Ambulatory Visit
Admission: RE | Admit: 2016-08-18 | Discharge: 2016-08-18 | Disposition: A | Discharge status: Home / Self Care | Payer: PPO | Source: Ambulatory Visit | Attending: Urology | Admitting: Urology

## 2016-08-18 ENCOUNTER — Other Ambulatory Visit: Payer: Self-pay | Admitting: Radiology

## 2016-08-18 ENCOUNTER — Encounter: Payer: Self-pay | Admitting: Urology

## 2016-08-18 ENCOUNTER — Encounter: Payer: Self-pay | Admitting: *Deleted

## 2016-08-18 ENCOUNTER — Ambulatory Visit: Payer: PPO | Admitting: Urology

## 2016-08-18 VITALS — BP 143/70 | HR 94 | Ht 72.0 in | Wt 190.0 lb

## 2016-08-18 DIAGNOSIS — N201 Calculus of ureter: Secondary | ICD-10-CM | POA: Diagnosis not present

## 2016-08-18 DIAGNOSIS — N179 Acute kidney failure, unspecified: Secondary | ICD-10-CM

## 2016-08-18 DIAGNOSIS — N202 Calculus of kidney with calculus of ureter: Secondary | ICD-10-CM | POA: Insufficient documentation

## 2016-08-18 DIAGNOSIS — N1330 Unspecified hydronephrosis: Secondary | ICD-10-CM | POA: Diagnosis not present

## 2016-08-18 DIAGNOSIS — N200 Calculus of kidney: Secondary | ICD-10-CM

## 2016-08-18 LAB — URINALYSIS, COMPLETE
BILIRUBIN UA: NEGATIVE
Glucose, UA: NEGATIVE
Ketones, UA: NEGATIVE
Leukocytes, UA: NEGATIVE
NITRITE UA: NEGATIVE
PH UA: 5.5 (ref 5.0–7.5)
Protein, UA: NEGATIVE
RBC, UA: NEGATIVE
SPEC GRAV UA: 1.02 (ref 1.005–1.030)
UUROB: 0.2 mg/dL (ref 0.2–1.0)

## 2016-08-18 MED ORDER — OXYCODONE-ACETAMINOPHEN 5-325 MG PO TABS: 1.0000 | ORAL_TABLET | ORAL | 0 refills | Status: AC | PRN

## 2016-08-18 NOTE — Progress Notes (Signed)
08/18/2016 8:32 PM   Leonard Stokes [birth year] [id number]  Referring provider: Derinda Late, MD [id number] [facility] [place], [place] [address]  Chief Complaint  Patient presents with  . Nephrolithiasis    HPI: 71 year old male previously followed by practice for BPH and erectile dysfunction who presents today for an ER follow up. He was seen in the emergency room on 08/11/2016 with an obstructing 1 cm right proximal ureteral calculus.   He had a significant leukocytosis and a negative urine in the emergency room. He developed mildly elevated creatinine to 1.48 from his previously normal baseline (0.9).  He denies any overt flank pain today although he has some mild aching in his right flank. This is not severe.  No associated nausea or vomiting. No fevers or chills. No dysuria, gross hematuria, or any other urinary symptoms.  No previous history of kidney stones.  On CT scan, he does have a 6 mm left upper pole stone in addition to the above.  Stone to skin distance 15 cm, [id number].    PMH: Past Medical History:  Diagnosis Date  . Atherosclerosis of aorta ([place]) 2018  . BPH (benign prostatic hyperplasia)   . Chronic kidney disease 08/2016   kidney stone  . Diverticulosis of sigmoid colon     Surgical History: Past Surgical History:  Procedure Laterality Date  . APPENDECTOMY    . JOINT REPLACEMENT Right    TKR  . TOTAL KNEE ARTHROPLASTY Right     Home Medications:  Allergies as of 08/18/2016      Reactions   Codeine Rash      Medication List       Accurate as of 08/18/16  8:32 PM. Always use your most recent med list.          diclofenac sodium 1 % Gel Commonly known as:  VOLTAREN Apply topically 4 (four) times daily.   ferrous sulfate 325 (65 FE) MG tablet Take 325 mg by mouth daily with breakfast.   losartan-hydrochlorothiazide 50-12.5 MG tablet Commonly known as:  HYZAAR   magnesium 30 MG  tablet Take 30 mg by mouth 2 (two) times daily.   oxyCODONE-acetaminophen 5-325 MG tablet Commonly known as:  ROXICET Take 1 tablet by mouth every 4 (four) hours as needed for severe pain.   sildenafil 20 MG tablet Commonly known as:  REVATIO Take 20 mg by mouth 3 (three) times daily.   TURMERIC CURCUMIN PO Take by mouth.   Vitamin D (Ergocalciferol) 50000 units Caps capsule Commonly known as:  DRISDOL Take 50,000 Units by mouth every 7 (seven) days.   zolpidem 10 MG tablet Commonly known as:  AMBIEN TAKE 1/2 TO 1 TABLET BY MOUTH AT BEDTIMEAS NEEDED       Allergies:  Allergies  Allergen Reactions  . Codeine Rash    Family History: History reviewed. No pertinent family history.  Social History:  reports that he quit smoking about 20 years ago. He has never used smokeless tobacco. He reports that he drinks alcohol. He reports that he does not use drugs.  ROS: UROLOGY Frequent Urination?: No Hard to postpone urination?: No Burning/pain with urination?: No Get up at night to urinate?: No Leakage of urine?: No Urine stream starts and stops?: Yes Trouble starting stream?: Yes Do you have to strain to urinate?: No Blood in urine?: No Urinary tract infection?: No Sexually transmitted disease?: No Injury to kidneys or bladder?: No Painful intercourse?: No Weak stream?:  No Erection problems?: Yes Penile pain?: No  Gastrointestinal Nausea?: No Vomiting?: No Indigestion/heartburn?: No Diarrhea?: No Constipation?: No  Constitutional Fever: No Night sweats?: No Weight loss?: No Fatigue?: No  Skin Skin rash/lesions?: No Itching?: No  Eyes Blurred vision?: No Double vision?: No  Ears/Nose/Throat Sore throat?: No Sinus problems?: No  Hematologic/Lymphatic Swollen glands?: No Easy bruising?: No  Cardiovascular Leg swelling?: No Chest pain?: No  Respiratory Cough?: No Shortness of breath?: No  Endocrine Excessive thirst?:  No  Musculoskeletal Back pain?: No Joint pain?: Yes  Neurological Headaches?: No Dizziness?: No  Psychologic Depression?: No Anxiety?: No  Physical Exam: BP (!) 143/70 (BP Location: Left Arm, Patient Position: Sitting, Cuff Size: Normal)   Pulse 94   Ht 6' (1.829 m)   Wt 190 lb (86.2 kg)   BMI 25.77 kg/m   Constitutional:  Alert and oriented, No acute distress. HEENT: [place] AT, moist mucus membranes.  Trachea midline, no masses. Cardiovascular: No clubbing, cyanosis, or edema. Respiratory: Normal respiratory effort, no increased work of breathing. GI: Abdomen is soft, nontender, nondistended, no abdominal masses GU: No CVA tenderness.  Skin: No rashes, bruises or suspicious lesions. Neurologic: Grossly intact, no focal deficits, moving all 4 extremities. Psychiatric: Normal mood and affect.  Laboratory Data: Lab Results  Component Value Date   WBC 10.8 (H) 08/10/2016   HGB 13.6 08/10/2016   HCT 38.9 (L) 08/10/2016   MCV 94.6 08/10/2016   PLT 211 08/10/2016    Lab Results  Component Value Date   CREATININE 1.48 (H) 08/10/2016    Urinalysis Results for orders placed or performed in visit on 08/18/16  Urinalysis, Complete  Result Value Ref Range   Specific Gravity, UA 1.020 1.005 - 1.030   pH, UA 5.5 5.0 - 7.5   Color, UA Yellow Yellow   Appearance Ur Clear Clear   Leukocytes, UA Negative Negative   Protein, UA Negative Negative/Trace   Glucose, UA Negative Negative   Ketones, UA Negative Negative   RBC, UA Negative Negative   Bilirubin, UA Negative Negative   Urobilinogen, Ur 0.2 0.2 - 1.0 mg/dL   Nitrite, UA Negative Negative     Pertinent Imaging: CLINICAL DATA:  71 year old male with right flank pain.  EXAM: CT ABDOMEN AND PELVIS WITH CONTRAST  TECHNIQUE: Multidetector CT imaging of the abdomen and pelvis was performed using the standard protocol following bolus administration of intravenous contrast.  CONTRAST:  32mL ISOVUE-300 IOPAMIDOL  (ISOVUE-300) INJECTION 61%  COMPARISON:  None.  FINDINGS: Lower chest: The visualized lung bases are clear.  No intra-abdominal free air or free fluid.  Hepatobiliary: There is apparent fatty infiltration of the liver. Small scattered hepatic hypodense lesions are not characterized but likely represent cysts or hemangioma. No intrahepatic biliary ductal dilatation. The gallbladder is unremarkable.  Pancreas: Unremarkable. No pancreatic ductal dilatation or surrounding inflammatory changes.  Spleen: Normal in size without focal abnormality.  Adrenals/Urinary Tract: The adrenal glands are unremarkable. There is a 10 mm obstructing right ureteropelvic junction stone with mild right hydronephrosis. There is mild right perinephric stranding. Correlation with urinalysis recommended to exclude superimposed UTI. There is a 6 mm nonobstructing left renal upper pole stone. No hydronephrosis. There is herniation of a portion of the bladder into the right inguinal canal. The bladder is otherwise unremarkable.  Stomach/Bowel: There is sigmoid diverticulosis without active inflammatory changes. Areas of narrowing in the sigmoid colon likely related to chronic inflammation and scarring or related to peristalsis. Liquid stool throughout the colon compatible with diarrheal  state. Correlation with clinical exam and stool cultures recommended. There is no evidence of bowel obstruction or active inflammation. The appendix is not visualized with certainty. No inflammatory changes identified in the right lower quadrant.  Vascular/Lymphatic: There is mild aortoiliac atherosclerotic disease no portal venous gas identified. There is no adenopathy. 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  Reproductive: Mild enlargement of the prostate gland measuring 5 cm in transverse diameter.  Other: None  Musculoskeletal: Bilateral L5 pars defects with grade 1 L5-S1 anterolisthesis. No acute  fracture.  IMPRESSION: 1. A 1 cm right UPJ stone with mild right hydronephrosis. Correlation with urinalysis recommended to exclude superimposed UTI. 2. A 6 mm nonobstructing left renal upper pole calculus. No hydronephrosis. 3. Sigmoid diverticulosis. 4. Diarrheal state. Correlation with clinical exam and stool cultures recommended. No bowel obstruction. 5.  Aortic Atherosclerosis (ICD10-I70.0).   Electronically Signed   By: Anner Crete M.D.   On: 08/11/2016 01:15  CT scan personally reviewed today and with the patient.  This was compared to KUB today which shows some progression to the mid ureter.  Assessment & Plan:    1. Ureteral stone 10 mm right UPJ stone KUB today shows progression of the stone to the mid ureter Given the size and location, spontaneous expulsion with medical expulsive therapy is unlikely but not impossible We discussed various treatment options including ESWL vs. ureteroscopy, laser lithotripsy, and stent. We discussed the risks and benefits of both including bleeding, infection, damage to surrounding structures, efficacy with need for possible further intervention, and need for temporary ureteral stent.  At this point time, he is most interested in shockwave lithotripsy. He understands that this procedure may fail and he may ultimately need ureteroscopy. All questions are answered. He'll be booked for tomorrow.  Additional narcotics in the form of Percocet No. 15 were given today - Urinalysis, Complete - DG Abd 1 View; Future  2. Acute kidney injury ([place]) Likely secondary acute dehydration and obstruction  3. Hydronephrosis, left Secondary to #1  4. Kidney stone on left side Incidental nonobstructing left upper pole 6 mm stone Recommend observation at this time  [name], MD  [facility] [address] [place], [place] [address] [phone]  I spent 25 min with this patient of which greater  than 50% was spent in counseling and coordination of care with the patient.

## 2016-08-18 NOTE — Telephone Encounter (Signed)
Pt scheduled for ESWL with Dr Erlene Quan on 08/19/16. Pt aware. Instructions given. Questions answered. Pt voices understanding.

## 2016-08-19 ENCOUNTER — Ambulatory Visit
Admission: RE | Admit: 2016-08-19 | Discharge: 2016-08-19 | Disposition: A | Discharge status: Home / Self Care | Payer: PPO | Source: Ambulatory Visit | Attending: Urology | Admitting: Urology

## 2016-08-19 ENCOUNTER — Encounter: Payer: Self-pay | Admitting: *Deleted

## 2016-08-19 ENCOUNTER — Encounter
Admission: RE | Disposition: A | Discharge status: Home / Self Care | Payer: Self-pay | Source: Ambulatory Visit | Attending: Urology

## 2016-08-19 DIAGNOSIS — N529 Male erectile dysfunction, unspecified: Secondary | ICD-10-CM | POA: Insufficient documentation

## 2016-08-19 DIAGNOSIS — Z79899 Other long term (current) drug therapy: Secondary | ICD-10-CM | POA: Diagnosis not present

## 2016-08-19 DIAGNOSIS — I700 Atherosclerosis of aorta: Secondary | ICD-10-CM | POA: Insufficient documentation

## 2016-08-19 DIAGNOSIS — N179 Acute kidney failure, unspecified: Secondary | ICD-10-CM | POA: Insufficient documentation

## 2016-08-19 DIAGNOSIS — Z87891 Personal history of nicotine dependence: Secondary | ICD-10-CM | POA: Diagnosis not present

## 2016-08-19 DIAGNOSIS — I10 Essential (primary) hypertension: Secondary | ICD-10-CM | POA: Insufficient documentation

## 2016-08-19 DIAGNOSIS — N201 Calculus of ureter: Secondary | ICD-10-CM

## 2016-08-19 DIAGNOSIS — Z96651 Presence of right artificial knee joint: Secondary | ICD-10-CM | POA: Insufficient documentation

## 2016-08-19 DIAGNOSIS — N132 Hydronephrosis with renal and ureteral calculous obstruction: Secondary | ICD-10-CM | POA: Insufficient documentation

## 2016-08-19 HISTORY — DX: Chronic kidney disease, unspecified: N18.9

## 2016-08-19 HISTORY — PX: EXTRACORPOREAL SHOCK WAVE LITHOTRIPSY: SHX1557

## 2016-08-19 HISTORY — DX: Diverticulosis of large intestine without perforation or abscess without bleeding: K57.30

## 2016-08-19 HISTORY — DX: Atherosclerosis of aorta: I70.0

## 2016-08-19 HISTORY — DX: Benign prostatic hyperplasia without lower urinary tract symptoms: N40.0

## 2016-08-19 SURGERY — LITHOTRIPSY, ESWL
Anesthesia: Moderate Sedation | Laterality: Right

## 2016-08-19 MED ORDER — DIAZEPAM 5 MG PO TABS
ORAL_TABLET | ORAL | Status: AC
  Administered 2016-08-19: 10 mg via ORAL
  Filled 2016-08-19: qty 2

## 2016-08-19 MED ORDER — CIPROFLOXACIN HCL 500 MG PO TABS
ORAL_TABLET | ORAL | Status: AC
  Administered 2016-08-19: 500 mg via ORAL
  Filled 2016-08-19: qty 1

## 2016-08-19 MED ORDER — DIPHENHYDRAMINE HCL 25 MG PO CAPS
25.0000 mg | ORAL_CAPSULE | ORAL | Status: AC
  Administered 2016-08-19: 25 mg via ORAL

## 2016-08-19 MED ORDER — CIPROFLOXACIN HCL 500 MG PO TABS
500.0000 mg | ORAL_TABLET | ORAL | Status: AC
  Administered 2016-08-19: 500 mg via ORAL

## 2016-08-19 MED ORDER — DIPHENHYDRAMINE HCL 25 MG PO CAPS
ORAL_CAPSULE | ORAL | Status: AC
  Administered 2016-08-19: 25 mg via ORAL
  Filled 2016-08-19: qty 1

## 2016-08-19 MED ORDER — ONDANSETRON HCL 4 MG/2ML IJ SOLN
INTRAMUSCULAR | Status: AC
  Administered 2016-08-19: 4 mg via INTRAVENOUS
  Filled 2016-08-19: qty 2

## 2016-08-19 MED ORDER — DIAZEPAM 5 MG PO TABS
10.0000 mg | ORAL_TABLET | ORAL | Status: AC
  Administered 2016-08-19: 10 mg via ORAL

## 2016-08-19 MED ORDER — SODIUM CHLORIDE 0.9 % IV SOLN
INTRAVENOUS | Status: DC
  Administered 2016-08-19: 07:00:00 via INTRAVENOUS

## 2016-08-19 MED ORDER — ONDANSETRON HCL 4 MG/2ML IJ SOLN
4.0000 mg | Freq: Once | INTRAMUSCULAR | Status: AC
  Administered 2016-08-19: 4 mg via INTRAVENOUS

## 2016-08-19 NOTE — H&P (View-Only) (Signed)
08/18/2016 8:32 PM   Leonard Stokes 09/02/1945 [id number]  Referring provider: Derinda Late, MD [phone] [address] [facility] [place], [place] [address]  Chief Complaint  Patient presents with  . Nephrolithiasis    HPI: 71 year old male previously followed by practice for BPH and erectile dysfunction who presents today for an ER follow up. He was seen in the emergency room on 08/11/2016 with an obstructing 1 cm right proximal ureteral calculus.   He had a significant leukocytosis and a negative urine in the emergency room. He developed mildly elevated creatinine to 1.48 from his previously normal baseline (0.9).  He denies any overt flank pain today although he has some mild aching in his right flank. This is not severe.  No associated nausea or vomiting. No fevers or chills. No dysuria, gross hematuria, or any other urinary symptoms.  No previous history of kidney stones.  On CT scan, he does have a 6 mm left upper pole stone in addition to the above.  Stone to skin distance 15 cm, [id number].    PMH: Past Medical History:  Diagnosis Date  . Atherosclerosis of aorta ([place]) 2018  . BPH (benign prostatic hyperplasia)   . Chronic kidney disease 08/2016   kidney stone  . Diverticulosis of sigmoid colon     Surgical History: Past Surgical History:  Procedure Laterality Date  . APPENDECTOMY    . JOINT REPLACEMENT Right    TKR  . TOTAL KNEE ARTHROPLASTY Right     Home Medications:  Allergies as of 08/18/2016      Reactions   Codeine Rash      Medication List       Accurate as of 08/18/16  8:32 PM. Always use your most recent med list.          diclofenac sodium 1 % Gel Commonly known as:  VOLTAREN Apply topically 4 (four) times daily.   ferrous sulfate 325 (65 FE) MG tablet Take 325 mg by mouth daily with breakfast.   losartan-hydrochlorothiazide 50-12.5 MG tablet Commonly known as:  HYZAAR   magnesium 30 MG  tablet Take 30 mg by mouth 2 (two) times daily.   oxyCODONE-acetaminophen 5-325 MG tablet Commonly known as:  ROXICET Take 1 tablet by mouth every 4 (four) hours as needed for severe pain.   sildenafil 20 MG tablet Commonly known as:  REVATIO Take 20 mg by mouth 3 (three) times daily.   TURMERIC CURCUMIN PO Take by mouth.   Vitamin D (Ergocalciferol) 50000 units Caps capsule Commonly known as:  DRISDOL Take 50,000 Units by mouth every 7 (seven) days.   zolpidem 10 MG tablet Commonly known as:  AMBIEN TAKE 1/2 TO 1 TABLET BY MOUTH AT BEDTIMEAS NEEDED       Allergies:  Allergies  Allergen Reactions  . Codeine Rash    Family History: History reviewed. No pertinent family history.  Social History:  reports that he quit smoking about 20 years ago. He has never used smokeless tobacco. He reports that he drinks alcohol. He reports that he does not use drugs.  ROS: UROLOGY Frequent Urination?: No Hard to postpone urination?: No Burning/pain with urination?: No Get up at night to urinate?: No Leakage of urine?: No Urine stream starts and stops?: Yes Trouble starting stream?: Yes Do you have to strain to urinate?: No Blood in urine?: No Urinary tract infection?: No Sexually transmitted disease?: No Injury to kidneys or bladder?: No Painful intercourse?: No Weak stream?:  No Erection problems?: Yes Penile pain?: No  Gastrointestinal Nausea?: No Vomiting?: No Indigestion/heartburn?: No Diarrhea?: No Constipation?: No  Constitutional Fever: No Night sweats?: No Weight loss?: No Fatigue?: No  Skin Skin rash/lesions?: No Itching?: No  Eyes Blurred vision?: No Double vision?: No  Ears/Nose/Throat Sore throat?: No Sinus problems?: No  Hematologic/Lymphatic Swollen glands?: No Easy bruising?: No  Cardiovascular Leg swelling?: No Chest pain?: No  Respiratory Cough?: No Shortness of breath?: No  Endocrine Excessive thirst?:  No  Musculoskeletal Back pain?: No Joint pain?: Yes  Neurological Headaches?: No Dizziness?: No  Psychologic Depression?: No Anxiety?: No  Physical Exam: BP (!) 143/70 (BP Location: Left Arm, Patient Position: Sitting, Cuff Size: Normal)   Pulse 94   Ht 6' (1.829 m)   Wt 190 lb (86.2 kg)   BMI 25.77 kg/m   Constitutional:  Alert and oriented, No acute distress. HEENT: [place] AT, moist mucus membranes.  Trachea midline, no masses. Cardiovascular: No clubbing, cyanosis, or edema. Respiratory: Normal respiratory effort, no increased work of breathing. GI: Abdomen is soft, nontender, nondistended, no abdominal masses GU: No CVA tenderness.  Skin: No rashes, bruises or suspicious lesions. Neurologic: Grossly intact, no focal deficits, moving all 4 extremities. Psychiatric: Normal mood and affect.  Laboratory Data: Lab Results  Component Value Date   WBC 10.8 (H) 08/10/2016   HGB 13.6 08/10/2016   HCT 38.9 (L) 08/10/2016   MCV 94.6 08/10/2016   PLT 211 08/10/2016    Lab Results  Component Value Date   CREATININE 1.48 (H) 08/10/2016    Urinalysis Results for orders placed or performed in visit on 08/18/16  Urinalysis, Complete  Result Value Ref Range   Specific Gravity, UA 1.020 1.005 - 1.030   pH, UA 5.5 5.0 - 7.5   Color, UA Yellow Yellow   Appearance Ur Clear Clear   Leukocytes, UA Negative Negative   Protein, UA Negative Negative/Trace   Glucose, UA Negative Negative   Ketones, UA Negative Negative   RBC, UA Negative Negative   Bilirubin, UA Negative Negative   Urobilinogen, Ur 0.2 0.2 - 1.0 mg/dL   Nitrite, UA Negative Negative     Pertinent Imaging: CLINICAL DATA:  71 year old male with right flank pain.  EXAM: CT ABDOMEN AND PELVIS WITH CONTRAST  TECHNIQUE: Multidetector CT imaging of the abdomen and pelvis was performed using the standard protocol following bolus administration of intravenous contrast.  CONTRAST:  15mL ISOVUE-300 IOPAMIDOL  (ISOVUE-300) INJECTION 61%  COMPARISON:  None.  FINDINGS: Lower chest: The visualized lung bases are clear.  No intra-abdominal free air or free fluid.  Hepatobiliary: There is apparent fatty infiltration of the liver. Small scattered hepatic hypodense lesions are not characterized but likely represent cysts or hemangioma. No intrahepatic biliary ductal dilatation. The gallbladder is unremarkable.  Pancreas: Unremarkable. No pancreatic ductal dilatation or surrounding inflammatory changes.  Spleen: Normal in size without focal abnormality.  Adrenals/Urinary Tract: The adrenal glands are unremarkable. There is a 10 mm obstructing right ureteropelvic junction stone with mild right hydronephrosis. There is mild right perinephric stranding. Correlation with urinalysis recommended to exclude superimposed UTI. There is a 6 mm nonobstructing left renal upper pole stone. No hydronephrosis. There is herniation of a portion of the bladder into the right inguinal canal. The bladder is otherwise unremarkable.  Stomach/Bowel: There is sigmoid diverticulosis without active inflammatory changes. Areas of narrowing in the sigmoid colon likely related to chronic inflammation and scarring or related to peristalsis. Liquid stool throughout the colon compatible with diarrheal  state. Correlation with clinical exam and stool cultures recommended. There is no evidence of bowel obstruction or active inflammation. The appendix is not visualized with certainty. No inflammatory changes identified in the right lower quadrant.  Vascular/Lymphatic: There is mild aortoiliac atherosclerotic disease no portal venous gas identified. There is no adenopathy. 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  Reproductive: Mild enlargement of the prostate gland measuring 5 cm in transverse diameter.  Other: None  Musculoskeletal: Bilateral L5 pars defects with grade 1 L5-S1 anterolisthesis. No acute  fracture.  IMPRESSION: 1. A 1 cm right UPJ stone with mild right hydronephrosis. Correlation with urinalysis recommended to exclude superimposed UTI. 2. A 6 mm nonobstructing left renal upper pole calculus. No hydronephrosis. 3. Sigmoid diverticulosis. 4. Diarrheal state. Correlation with clinical exam and stool cultures recommended. No bowel obstruction. 5.  Aortic Atherosclerosis (ICD10-I70.0).   Electronically Signed   By: Anner Crete M.D.   On: 08/11/2016 01:15  CT scan personally reviewed today and with the patient.  This was compared to KUB today which shows some progression to the mid ureter.  Assessment & Plan:    1. Ureteral stone 10 mm right UPJ stone KUB today shows progression of the stone to the mid ureter Given the size and location, spontaneous expulsion with medical expulsive therapy is unlikely but not impossible We discussed various treatment options including ESWL vs. ureteroscopy, laser lithotripsy, and stent. We discussed the risks and benefits of both including bleeding, infection, damage to surrounding structures, efficacy with need for possible further intervention, and need for temporary ureteral stent.  At this point time, he is most interested in shockwave lithotripsy. He understands that this procedure may fail and he may ultimately need ureteroscopy. All questions are answered. He'll be booked for tomorrow.  Additional narcotics in the form of Percocet No. 15 were given today - Urinalysis, Complete - DG Abd 1 View; Future  2. Acute kidney injury ([place]) Likely secondary acute dehydration and obstruction  3. Hydronephrosis, left Secondary to #1  4. Kidney stone on left side Incidental nonobstructing left upper pole 6 mm stone Recommend observation at this time  [name], MD  [facility] [address] [place], [place] [address] [phone]  I spent 25 min with this patient of which greater  than 50% was spent in counseling and coordination of care with the patient.

## 2016-08-19 NOTE — Discharge Instructions (Signed)
AMBULATORY SURGERY  DISCHARGE INSTRUCTIONS   1) The drugs that you were given will stay in your system until tomorrow so for the next 24 hours you should not:  A) Drive an automobile B) Make any legal decisions C) Drink any alcoholic beverage   2) You may resume regular meals tomorrow.  Today it is better to start with liquids and gradually work up to solid foods.  You may eat anything you prefer, but it is better to start with liquids, then soup and crackers, and gradually work up to solid foods.   3) Please notify your doctor immediately if you have any unusual bleeding, trouble breathing, redness and pain at the surgery site, drainage, fever, or pain not relieved by medication.    4) Additional Instructions:DRIINK   DRINK   DRINK   DRINK   . Strain urine and keep any stone pieces to take to Dr. Erlene Quan.  Tylenol for back discomfort or a heating pad if needed to flank region.     Please contact your physician with any problems or Same Day Surgery at [phone], Monday through Friday 6 am to 4 pm, or [place] at [facility] number at [phone].

## 2016-08-19 NOTE — Interval H&P Note (Signed)
History and Physical Interval Note:  08/19/2016 9:25 AM  Leonard Stokes  has presented today for surgery, with the diagnosis of Kidney Stone  The various methods of treatment have been discussed with the patient and family. After consideration of risks, benefits and other options for treatment, the patient has consented to  Procedure(s): EXTRACORPOREAL SHOCK WAVE LITHOTRIPSY (ESWL) (Right) as a surgical intervention .  The patient's history has been reviewed, patient examined, no change in status, stable for surgery.  I have reviewed the patient's chart and labs.  Questions were answered to the patient's satisfaction.    RRR ctab   [name]

## 2016-08-20 ENCOUNTER — Encounter: Payer: Self-pay | Admitting: Urology

## 2016-08-30 DIAGNOSIS — M1712 Unilateral primary osteoarthritis, left knee: Secondary | ICD-10-CM | POA: Diagnosis not present

## 2016-09-03 ENCOUNTER — Ambulatory Visit
Admission: RE | Admit: 2016-09-03 | Discharge: 2016-09-03 | Disposition: A | Discharge status: Home / Self Care | Payer: PPO | Source: Ambulatory Visit | Attending: Urology | Admitting: Urology

## 2016-09-03 ENCOUNTER — Ambulatory Visit (INDEPENDENT_AMBULATORY_CARE_PROVIDER_SITE_OTHER): Payer: PPO | Admitting: Urology

## 2016-09-03 ENCOUNTER — Encounter: Payer: Self-pay | Admitting: Urology

## 2016-09-03 VITALS — BP 152/87 | HR 79 | Resp 12 | Ht 72.0 in | Wt 187.0 lb

## 2016-09-03 DIAGNOSIS — N200 Calculus of kidney: Secondary | ICD-10-CM | POA: Insufficient documentation

## 2016-09-03 DIAGNOSIS — N201 Calculus of ureter: Secondary | ICD-10-CM

## 2016-09-03 NOTE — Progress Notes (Signed)
09/03/2016 10:16 AM   Leonard Stokes 72-Oct-1947 [id number]  Referring provider: Derinda Late, MD [phone] [address] [facility] [place], [place] [address]  Chief Complaint  Patient presents with  . Follow-up    ESWL with KUB    HPI: 71 year old male who presents today 2 weeks following shockwave lithotripsy for treatment of a 1 cm right proximal ureteral calculus. At the time of the procedure, the stone and migrated to the mid ureter.  Following the procedure, he is done extremely well. He said no flank pain. He did pass numerous small pieces the first day or 2 and stops during his urine thereafter. He brings this pieces with him today.  KUB shows at least 5-6 fragments in similar location dimensioning excellent fragmentation but residual stone burden in the ureter.  He also has a 6 L left upper pole stone which is nonobstructing and asymptomatic.  He denies any flank pain, fevers, chills, urinary symptoms, gross hematuria, dysuria.   PMH: Past Medical History:  Diagnosis Date  . Atherosclerosis of aorta ([place]) 2018  . BPH (benign prostatic hyperplasia)   . Chronic kidney disease 08/2016   kidney stone  . Diverticulosis of sigmoid colon     Surgical History: Past Surgical History:  Procedure Laterality Date  . APPENDECTOMY    . EXTRACORPOREAL SHOCK WAVE LITHOTRIPSY Right 08/19/2016   Procedure: EXTRACORPOREAL SHOCK WAVE LITHOTRIPSY (ESWL);  Surgeon: [name], MD;  Location: [facility] ORS;  Service: Urology;  Laterality: Right;  . JOINT REPLACEMENT Right    TKR  . TOTAL KNEE ARTHROPLASTY Right     Home Medications:  Allergies as of 09/03/2016      Reactions   Codeine Rash      Medication List       Accurate as of 09/03/16 10:16 AM. Always use your most recent med list.          diclofenac sodium 1 % Gel Commonly known as:  VOLTAREN Apply topically 4 (four) times daily.   ferrous sulfate 325 (65 FE) MG tablet Take 325  mg by mouth daily with breakfast.   losartan-hydrochlorothiazide 50-12.5 MG tablet Commonly known as:  HYZAAR   magnesium 30 MG tablet Take 30 mg by mouth 2 (two) times daily.   meloxicam 7.5 MG tablet Commonly known as:  MOBIC Take 7.5 mg by mouth daily.   sildenafil 20 MG tablet Commonly known as:  REVATIO Take 20 mg by mouth 3 (three) times daily.   TURMERIC CURCUMIN PO Take by mouth.   Vitamin D (Ergocalciferol) 50000 units Caps capsule Commonly known as:  DRISDOL Take 50,000 Units by mouth every 7 (seven) days.   zolpidem 10 MG tablet Commonly known as:  AMBIEN TAKE 1/2 TO 1 TABLET BY MOUTH AT BEDTIMEAS NEEDED       Allergies:  Allergies  Allergen Reactions  . Codeine Rash    Family History: No family history on file.  Social History:  reports that he quit smoking about 20 years ago. He has never used smokeless tobacco. He reports that he drinks alcohol. He reports that he does not use drugs.  ROS: UROLOGY Frequent Urination?: No Hard to postpone urination?: No Burning/pain with urination?: No Get up at night to urinate?: Yes Leakage of urine?: No Urine stream starts and stops?: No Trouble starting stream?: No Do you have to strain to urinate?: No Blood in urine?: No Urinary tract infection?: No Sexually transmitted disease?: No Injury to kidneys or  bladder?: No Painful intercourse?: No Weak stream?: No Erection problems?: Yes Penile pain?: No  Gastrointestinal Nausea?: No Vomiting?: No Indigestion/heartburn?: No Diarrhea?: No Constipation?: No  Constitutional Fever: No Night sweats?: No Weight loss?: No Fatigue?: No  Skin Skin rash/lesions?: No Itching?: No  Eyes Blurred vision?: No Double vision?: No  Ears/Nose/Throat Sore throat?: No Sinus problems?: No  Hematologic/Lymphatic Swollen glands?: No Easy bruising?: No  Cardiovascular Leg swelling?: No Chest pain?: No  Respiratory Cough?: No Shortness of breath?:  No  Endocrine Excessive thirst?: No  Musculoskeletal Back pain?: No Joint pain?: Yes  Neurological Headaches?: No Dizziness?: No  Psychologic Depression?: No Anxiety?: No  Physical Exam: BP (!) 152/87   Pulse 79   Resp 12   Ht 6' (1.829 m)   Wt 187 lb (84.8 kg)   BMI 25.36 kg/m   Constitutional:  Alert and oriented, No acute distress. HEENT: [place] AT, moist mucus membranes.  Trachea midline, no masses. Cardiovascular: No clubbing, cyanosis, or edema. Respiratory: Normal respiratory effort, no increased work of breathing. GI: Abdomen is soft, nontender, nondistended, no abdominal masses GU: No CVA tenderness. Skin: No rashes, bruises or suspicious lesions. Neurologic: Grossly intact, no focal deficits, moving all 4 extremities. Psychiatric: Normal mood and affect.  Laboratory Data: Lab Results  Component Value Date   WBC 10.8 (H) 08/10/2016   HGB 13.6 08/10/2016   HCT 38.9 (L) 08/10/2016   MCV 94.6 08/10/2016   PLT 211 08/10/2016    Lab Results  Component Value Date   CREATININE 1.48 (H) 08/10/2016    Urinalysis N/a  Pertinent Imaging: CLINICAL DATA:  Prior lithotripsy on right.  EXAM: ABDOMEN - 1 VIEW  COMPARISON:  08/18/2016  FINDINGS: There are a line of small stones now present in the mid right ureter to the right of the L5 transverse process. Small stone again projects over the upper pole of the left kidney. Nonobstructive bowel gas pattern. No organomegaly.  IMPRESSION: Line of multiple small stones projecting over the mid right ureter to the right of the L5 transverse process.  Left nephrolithiasis.   Electronically Signed   By: Rolm Baptise M.D.   On: 09/03/2016 09:25   KUB personally reviewed and compared to previous  Assessment & Plan:    1. Ureteral stone S/p ESWL 2 weeks ago with excellent fragmentation but residual stone burden consistent with [place]strasse in the right mid ureter Currently asymptomatic Given that  he has passed some pieces, like to give him 2 additional weeks with medical expulsive therapy Continue Flomax, encouraged copious hydration Warning symptoms reviewed If he still has residual stone burden at next visit, we will arrange for repeat shockwave versus ureteroscopy to clear stone burden - DG Abd 1 View; Future  2. Kidney stone on left side Incidental nonobstructing left upper pole 6 mm stone Recommend observation at this time   Return in about 4 weeks (around 10/01/2016).  [name], MD  [facility] [address], [place], [place] [address] [phone]

## 2016-09-17 ENCOUNTER — Ambulatory Visit
Admission: RE | Admit: 2016-09-17 | Discharge: 2016-09-17 | Disposition: A | Discharge status: Home / Self Care | Payer: PPO | Source: Ambulatory Visit | Attending: Urology | Admitting: Urology

## 2016-09-17 ENCOUNTER — Encounter: Payer: Self-pay | Admitting: Urology

## 2016-09-17 ENCOUNTER — Ambulatory Visit (INDEPENDENT_AMBULATORY_CARE_PROVIDER_SITE_OTHER): Payer: PPO | Admitting: Urology

## 2016-09-17 VITALS — BP 134/77 | HR 80 | Ht 72.0 in | Wt 185.0 lb

## 2016-09-17 DIAGNOSIS — N201 Calculus of ureter: Secondary | ICD-10-CM

## 2016-09-17 DIAGNOSIS — N200 Calculus of kidney: Secondary | ICD-10-CM | POA: Diagnosis not present

## 2016-09-17 NOTE — Progress Notes (Signed)
09/17/2016 1:46 PM   Leonard Stokes Aug 21, 1945 [id number]  Referring provider: Derinda Late, MD [phone] [address] [facility] [place], [place] [address]  No chief complaint on file.   HPI: 71 year old male who presents today 4 weeks following shockwave lithotripsy for treatment of a 1 cm right proximal ureteral calculus. At the time of the procedure, the stone and migrated to the mid ureter.  At 2 weeks postop, he had fairly significant stone burden debris in the mid ureter. Today, KUB demonstrates that this debris has since passed. He is passed numerous additional fragments since his last visit.  He denies any significant flank pain, gross hematuria, or any other urinary symptoms. Overall, he is doing very well and very pleased with the process.  He also has a 6 mm  left upper pole stone which is nonobstructing and asymptomatic.  He denies any flank pain, fevers, chills, urinary symptoms, gross hematuria, dysuria.  He does report that he'll be traveling [place] for a [place] and is anxious about having a left-sided stone.  PMH: Past Medical History:  Diagnosis Date  . Atherosclerosis of aorta ([place]) 2018  . BPH (benign prostatic hyperplasia)   . Chronic kidney disease 08/2016   kidney stone  . Diverticulosis of sigmoid colon     Surgical History: Past Surgical History:  Procedure Laterality Date  . APPENDECTOMY    . EXTRACORPOREAL SHOCK WAVE LITHOTRIPSY Right 08/19/2016   Procedure: EXTRACORPOREAL SHOCK WAVE LITHOTRIPSY (ESWL);  Surgeon: [name], MD;  Location: [facility] ORS;  Service: Urology;  Laterality: Right;  . JOINT REPLACEMENT Right    TKR  . TOTAL KNEE ARTHROPLASTY Right     Home Medications:  Allergies as of 09/17/2016      Reactions   Codeine Rash      Medication List       Accurate as of 09/17/16 11:59 PM. Always use your most recent med list.          diclofenac sodium 1 % Gel Commonly known as:   VOLTAREN Apply topically 4 (four) times daily.   ferrous sulfate 325 (65 FE) MG tablet Take 325 mg by mouth daily with breakfast.   losartan-hydrochlorothiazide 50-12.5 MG tablet Commonly known as:  HYZAAR   magnesium 30 MG tablet Take 30 mg by mouth 2 (two) times daily.   meloxicam 7.5 MG tablet Commonly known as:  MOBIC Take 7.5 mg by mouth daily.   sildenafil 20 MG tablet Commonly known as:  REVATIO Take 20 mg by mouth 3 (three) times daily.   TURMERIC CURCUMIN PO Take by mouth.   Vitamin D (Ergocalciferol) 50000 units Caps capsule Commonly known as:  DRISDOL Take 50,000 Units by mouth every 7 (seven) days.   zolpidem 10 MG tablet Commonly known as:  AMBIEN TAKE 1/2 TO 1 TABLET BY MOUTH AT BEDTIMEAS NEEDED       Allergies:  Allergies  Allergen Reactions  . Codeine Rash    Family History: No family history on file.  Social History:  reports that he quit smoking about 20 years ago. He has never used smokeless tobacco. He reports that he drinks alcohol. He reports that he does not use drugs.  ROS: UROLOGY Frequent Urination?: No Hard to postpone urination?: No Burning/pain with urination?: No Get up at night to urinate?: No Leakage of urine?: No Urine stream starts and stops?: No Trouble starting stream?: No Do you have to strain to urinate?: No Blood in urine?: No  Urinary tract infection?: No Sexually transmitted disease?: No Injury to kidneys or bladder?: No Painful intercourse?: No Weak stream?: No Erection problems?: No Penile pain?: No  Gastrointestinal Nausea?: No Vomiting?: No Indigestion/heartburn?: No Diarrhea?: No Constipation?: No  Constitutional Fever: No Night sweats?: No Weight loss?: No Fatigue?: No  Skin Skin rash/lesions?: No Itching?: No  Eyes Blurred vision?: No Double vision?: No  Ears/Nose/Throat Sore throat?: No Sinus problems?: No  Hematologic/Lymphatic Swollen glands?: No Easy bruising?:  No  Cardiovascular Leg swelling?: No Chest pain?: No  Respiratory Cough?: No Shortness of breath?: No  Endocrine Excessive thirst?: No  Musculoskeletal Back pain?: No Joint pain?: Yes  Neurological Headaches?: No Dizziness?: No  Psychologic Depression?: No Anxiety?: No  Physical Exam: BP 134/77   Pulse 80   Ht 6' (1.829 m)   Wt 185 lb (83.9 kg)   BMI 25.09 kg/m   Constitutional:  Alert and oriented, No acute distress. HEENT: [place] AT, moist mucus membranes.  Trachea midline, no masses. Cardiovascular: No clubbing, cyanosis, or edema. Respiratory: Normal respiratory effort, no increased work of breathing. GI: Abdomen is soft, nontender, nondistended, no abdominal masses GU: No CVA tenderness. Skin: No rashes, bruises or suspicious lesions. Neurologic: Grossly intact, no focal deficits, moving all 4 extremities. Psychiatric: Normal mood and affect.  Laboratory Data: Lab Results  Component Value Date   WBC 10.8 (H) 08/10/2016   HGB 13.6 08/10/2016   HCT 38.9 (L) 08/10/2016   MCV 94.6 08/10/2016   PLT 211 08/10/2016    Lab Results  Component Value Date   CREATININE 1.48 (H) 08/10/2016    Urinalysis N/a  Pertinent Imaging: CLINICAL DATA:  Nephrolithiasis.  EXAM: ABDOMEN - 1 VIEW  COMPARISON:  Radiograph of September 03, 2016.  FINDINGS: The bowel gas pattern is normal. Stable left renal calculus is noted. Right ureteral calculi noted on prior exam are no longer visualized. Stable phleboliths are noted in the pelvis.  IMPRESSION: Stable left renal calculus. Right ureteral calculi noted on prior exam no longer present.   Electronically Signed   By: Marijo Conception, M.D.   On: 09/17/2016 13:18    KUB personally reviewed and compared to previous  Assessment & Plan:    1. Ureteral stone S/p ESWL 4 weeks ago with excellent fragmentation and interval passage of stone burden on right  option to send stone fragments reviewed for  analysis-declined We discussed general stone prevention techniques including drinking plenty water with goal of producing 2.5 L urine daily, increased citric acid intake, avoidance of high oxalate containing foods, and decreased salt intake.  Information about dietary recommendations given today.   2. Kidney stone on left side Incidental nonobstructing left upper pole 6 mm stone Option for shockwave lithotripsy on the side was discussed given his concern for upcoming travel, ultimately elected to observe   Return in about 6 months (around 03/20/2017) for KUB.  [name], MD  [facility] [address] [place], [place] [address] [phone]

## 2016-09-20 ENCOUNTER — Other Ambulatory Visit: Payer: Self-pay | Admitting: Urology

## 2016-10-01 DIAGNOSIS — M1712 Unilateral primary osteoarthritis, left knee: Secondary | ICD-10-CM | POA: Diagnosis not present

## 2016-10-01 DIAGNOSIS — S83232D Complex tear of medial meniscus, current injury, left knee, subsequent encounter: Secondary | ICD-10-CM | POA: Diagnosis not present

## 2016-10-19 DIAGNOSIS — Z85828 Personal history of other malignant neoplasm of skin: Secondary | ICD-10-CM | POA: Diagnosis not present

## 2016-10-19 DIAGNOSIS — L570 Actinic keratosis: Secondary | ICD-10-CM | POA: Diagnosis not present

## 2016-10-19 DIAGNOSIS — D1801 Hemangioma of skin and subcutaneous tissue: Secondary | ICD-10-CM | POA: Diagnosis not present

## 2016-10-19 DIAGNOSIS — L728 Other follicular cysts of the skin and subcutaneous tissue: Secondary | ICD-10-CM | POA: Diagnosis not present

## 2016-10-19 DIAGNOSIS — L821 Other seborrheic keratosis: Secondary | ICD-10-CM | POA: Diagnosis not present

## 2016-10-19 DIAGNOSIS — X32XXXA Exposure to sunlight, initial encounter: Secondary | ICD-10-CM | POA: Diagnosis not present

## 2016-11-08 ENCOUNTER — Encounter
Admission: RE | Admit: 2016-11-08 | Discharge: 2016-11-08 | Disposition: A | Discharge status: Home / Self Care | Payer: PPO | Source: Ambulatory Visit | Attending: Orthopedic Surgery | Admitting: Orthopedic Surgery

## 2016-11-08 DIAGNOSIS — N189 Chronic kidney disease, unspecified: Secondary | ICD-10-CM | POA: Diagnosis not present

## 2016-11-08 DIAGNOSIS — Z01812 Encounter for preprocedural laboratory examination: Secondary | ICD-10-CM | POA: Diagnosis not present

## 2016-11-08 DIAGNOSIS — Z01810 Encounter for preprocedural cardiovascular examination: Secondary | ICD-10-CM | POA: Insufficient documentation

## 2016-11-08 DIAGNOSIS — R001 Bradycardia, unspecified: Secondary | ICD-10-CM | POA: Diagnosis not present

## 2016-11-08 DIAGNOSIS — M79604 Pain in right leg: Secondary | ICD-10-CM | POA: Diagnosis not present

## 2016-11-08 DIAGNOSIS — N400 Enlarged prostate without lower urinary tract symptoms: Secondary | ICD-10-CM | POA: Diagnosis not present

## 2016-11-08 DIAGNOSIS — I700 Atherosclerosis of aorta: Secondary | ICD-10-CM | POA: Insufficient documentation

## 2016-11-08 DIAGNOSIS — M1990 Unspecified osteoarthritis, unspecified site: Secondary | ICD-10-CM | POA: Insufficient documentation

## 2016-11-08 DIAGNOSIS — I120 Hypertensive chronic kidney disease with stage 5 chronic kidney disease or end stage renal disease: Secondary | ICD-10-CM | POA: Insufficient documentation

## 2016-11-08 DIAGNOSIS — M79605 Pain in left leg: Secondary | ICD-10-CM | POA: Insufficient documentation

## 2016-11-08 HISTORY — DX: Cramp and spasm: R25.2

## 2016-11-08 HISTORY — DX: Unspecified osteoarthritis, unspecified site: M19.90

## 2016-11-08 LAB — COMPREHENSIVE METABOLIC PANEL
ALK PHOS: 58 U/L (ref 38–126)
ALT: 18 U/L (ref 17–63)
ANION GAP: 8 (ref 5–15)
AST: 18 U/L (ref 15–41)
Albumin: 4.4 g/dL (ref 3.5–5.0)
BILIRUBIN TOTAL: 1.1 mg/dL (ref 0.3–1.2)
BUN: 28 mg/dL — ABNORMAL HIGH (ref 6–20)
CALCIUM: 9.6 mg/dL (ref 8.9–10.3)
CO2: 26 mmol/L (ref 22–32)
Chloride: 105 mmol/L (ref 101–111)
Creatinine, Ser: 0.88 mg/dL (ref 0.61–1.24)
GFR calc Af Amer: >60 mL/min (ref >60)
GFR calc non Af Amer: >60 mL/min (ref >60)
Glucose, Bld: 100 mg/dL — ABNORMAL HIGH (ref 65–99)
Potassium: 4.1 mmol/L (ref 3.5–5.1)
Sodium: 139 mmol/L (ref 135–145)
Total Protein: 7.2 g/dL (ref 6.5–8.1)

## 2016-11-08 LAB — TYPE AND SCREEN
ABO/RH(D): A POS
Antibody Screen: NEGATIVE

## 2016-11-08 LAB — URINALYSIS, ROUTINE W REFLEX MICROSCOPIC
BILIRUBIN URINE: NEGATIVE
Glucose, UA: NEGATIVE mg/dL
Hgb urine dipstick: NEGATIVE
Ketones, ur: NEGATIVE mg/dL
Leukocytes, UA: NEGATIVE
Nitrite: NEGATIVE
PH: 5 (ref 5.0–8.0)
Protein, ur: NEGATIVE mg/dL
Specific Gravity, Urine: 1.023 (ref 1.005–1.030)

## 2016-11-08 LAB — CBC
HCT: 40.3 % (ref 40.0–52.0)
Hemoglobin: 13.9 g/dL (ref 13.0–18.0)
MCH: 33.1 pg (ref 26.0–34.0)
MCHC: 34.6 g/dL (ref 32.0–36.0)
MCV: 95.8 fL (ref 80.0–100.0)
Platelets: 295 10*3/uL (ref 150–440)
RBC: 4.2 MIL/uL — ABNORMAL LOW (ref 4.40–5.90)
RDW: 13.6 % (ref 11.5–14.5)
WBC: 6.3 10*3/uL (ref 3.8–10.6)

## 2016-11-08 LAB — PROTIME-INR
INR: 0.96
Prothrombin Time: 12.7 seconds (ref 11.4–15.2)

## 2016-11-08 LAB — APTT: aPTT: 31 s (ref 24–36)

## 2016-11-08 LAB — SURGICAL PCR SCREEN
MRSA, PCR: NEGATIVE
Staphylococcus aureus: NEGATIVE

## 2016-11-08 LAB — SEDIMENTATION RATE: Sed Rate: 8 mm/hr (ref 0–20)

## 2016-11-08 LAB — C-REACTIVE PROTEIN

## 2016-11-08 NOTE — Patient Instructions (Signed)
Your procedure is scheduled on: 11/15/16 Mon Report to Same Day Surgery 2nd floor medical mall [facility] Entrance-take elevator on left to 2nd floor.  Check in with surgery information desk.) To find out your arrival time please call [phone] between 1PM - 3PM on 11/12/16 Fri  Remember: Instructions that are not followed completely may result in serious medical risk, up to and including death, or upon the discretion of your surgeon and anesthesiologist your surgery may need to be rescheduled.    _x___ 1. Do not eat food after midnight the night before your procedure. You may drink clear liquids up to 2 hours before you are scheduled to arrive at the hospital for your procedure.  Do not drink clear liquids within 2 hours of your scheduled arrival to the hospital.  Clear liquids include  --Water or Apple juice without pulp  --Clear carbohydrate beverage such as ClearFast or Gatorade  --Black Coffee or Clear Tea (No milk, no creamers, do not add anything to                  the coffee or Tea Type 1 and type 2 diabetics should only drink water.  No gum chewing or hard candies.     __x__ 2. No Alcohol for 24 hours before or after surgery.   __x__3. No Smoking for 24 prior to surgery.   ____  4. Bring all medications with you on the day of surgery if instructed.    __x__ 5. Notify your doctor if there is any change in your medical condition     (cold, fever, infections).     Do not wear jewelry, make-up, hairpins, clips or nail polish.  Do not wear lotions, powders, or perfumes. You may wear deodorant.  Do not shave 48 hours prior to surgery. Men may shave face and neck.  Do not bring valuables to the hospital.    [facility] is not responsible for any belongings or valuables.               Contacts, dentures or bridgework may not be worn into surgery.  Leave your suitcase in the car. After surgery it may be brought to your room.  For patients admitted to the hospital, discharge  time is determined by your                       treatment team.   Patients discharged the day of surgery will not be allowed to drive home.  You will need someone to drive you home and stay with you the night of your procedure.    Please read over the following fact sheets that you were given:   [facility] Preparing for Surgery and or MRSA Information   _x___ Take anti-hypertensive listed below, cardiac, seizure, asthma,     anti-reflux and psychiatric medicines. These include:  1. None  2.  3.  4.  5.  6.  ____Fleets enema or Magnesium Citrate as directed.   _x___ Use CHG Soap or sage wipes as directed on instruction sheet   ____ Use inhalers on the day of surgery and bring to hospital day of surgery  ____ Stop Metformin and Janumet 2 days prior to surgery.    ____ Take 1/2 of usual insulin dose the night before surgery and none on the morning     surgery.   _x___ Follow recommendations from Cardiologist, Pulmonologist or PCP regarding  stopping Aspirin, Coumadin, Plavix ,Eliquis, Effient, or Pradaxa, and Pletal.  X____Stop Anti-inflammatories such as Advil, Aleve, Ibuprofen, Motrin, Naproxen, Naprosyn, Goodies powders or aspirin products. OK to take Tylenol and                          Celebrex.   _x___ Stop supplements until after surgery.  But may continue Vitamin D, Vitamin B,       and multivitamin.   Stop Turmeric and fish oil today  ____ Bring C-Pap to the hospital.

## 2016-11-09 DIAGNOSIS — M25562 Pain in left knee: Secondary | ICD-10-CM | POA: Diagnosis not present

## 2016-11-09 LAB — URINE CULTURE: CULTURE: NO GROWTH

## 2016-11-15 ENCOUNTER — Encounter: Payer: Self-pay | Admitting: Orthopedic Surgery

## 2016-11-15 ENCOUNTER — Ambulatory Visit: Payer: PPO | Admitting: Anesthesiology

## 2016-11-15 ENCOUNTER — Inpatient Hospital Stay: Payer: PPO

## 2016-11-15 ENCOUNTER — Encounter
Admission: RE | Disposition: A | Discharge status: Home Health | Payer: Self-pay | Source: Ambulatory Visit | Attending: Orthopedic Surgery

## 2016-11-15 ENCOUNTER — Inpatient Hospital Stay
Admission: RE | Admit: 2016-11-15 | Discharge: 2016-11-17 | DRG: 470 | Disposition: A | Discharge status: Home Health | Payer: PPO | Source: Ambulatory Visit | Attending: Orthopedic Surgery | Admitting: Orthopedic Surgery

## 2016-11-15 DIAGNOSIS — Z885 Allergy status to narcotic agent status: Secondary | ICD-10-CM

## 2016-11-15 DIAGNOSIS — Z791 Long term (current) use of non-steroidal anti-inflammatories (NSAID): Secondary | ICD-10-CM

## 2016-11-15 DIAGNOSIS — Z803 Family history of malignant neoplasm of breast: Secondary | ICD-10-CM | POA: Diagnosis not present

## 2016-11-15 DIAGNOSIS — Z800 Family history of malignant neoplasm of digestive organs: Secondary | ICD-10-CM

## 2016-11-15 DIAGNOSIS — Z87891 Personal history of nicotine dependence: Secondary | ICD-10-CM

## 2016-11-15 DIAGNOSIS — M1712 Unilateral primary osteoarthritis, left knee: Principal | ICD-10-CM | POA: Diagnosis present

## 2016-11-15 DIAGNOSIS — N400 Enlarged prostate without lower urinary tract symptoms: Secondary | ICD-10-CM | POA: Diagnosis not present

## 2016-11-15 DIAGNOSIS — I700 Atherosclerosis of aorta: Secondary | ICD-10-CM | POA: Diagnosis present

## 2016-11-15 DIAGNOSIS — Z792 Long term (current) use of antibiotics: Secondary | ICD-10-CM

## 2016-11-15 DIAGNOSIS — Z96659 Presence of unspecified artificial knee joint: Secondary | ICD-10-CM

## 2016-11-15 DIAGNOSIS — Z471 Aftercare following joint replacement surgery: Secondary | ICD-10-CM | POA: Diagnosis not present

## 2016-11-15 DIAGNOSIS — Z96651 Presence of right artificial knee joint: Secondary | ICD-10-CM | POA: Diagnosis not present

## 2016-11-15 DIAGNOSIS — Z87442 Personal history of urinary calculi: Secondary | ICD-10-CM

## 2016-11-15 DIAGNOSIS — I739 Peripheral vascular disease, unspecified: Secondary | ICD-10-CM | POA: Diagnosis not present

## 2016-11-15 DIAGNOSIS — Z96652 Presence of left artificial knee joint: Secondary | ICD-10-CM | POA: Diagnosis not present

## 2016-11-15 HISTORY — PX: KNEE ARTHROPLASTY: SHX992

## 2016-11-15 LAB — ABO/RH: ABO/RH(D): A POS

## 2016-11-15 SURGERY — ARTHROPLASTY, KNEE, TOTAL, USING IMAGELESS COMPUTER-ASSISTED NAVIGATION
Anesthesia: Spinal | Site: Knee | Laterality: Left | Wound class: Clean

## 2016-11-15 MED ORDER — ZOLPIDEM TARTRATE 5 MG PO TABS: 10.0000 mg | ORAL_TABLET | Freq: Every evening | ORAL | Status: DC | PRN

## 2016-11-15 MED ORDER — ONDANSETRON HCL 4 MG/2ML IJ SOLN: 4.0000 mg | Freq: Four times a day (QID) | INTRAMUSCULAR | Status: DC | PRN

## 2016-11-15 MED ORDER — SODIUM CHLORIDE 0.9 % IV SOLN
INTRAVENOUS | Status: DC
  Administered 2016-11-15: 12:00:00 via INTRAVENOUS

## 2016-11-15 MED ORDER — TRAMADOL HCL 50 MG PO TABS
50.0000 mg | ORAL_TABLET | ORAL | Status: DC | PRN
  Administered 2016-11-16 (×2): 100 mg via ORAL
  Filled 2016-11-15: qty 2
  Filled 2016-11-15: qty 1
  Filled 2016-11-15: qty 2

## 2016-11-15 MED ORDER — SODIUM CHLORIDE 0.9 % IV SOLN
INTRAVENOUS | Status: DC | PRN
  Administered 2016-11-15: 60 mL

## 2016-11-15 MED ORDER — NEOMYCIN-POLYMYXIN B GU 40-200000 IR SOLN
Status: AC
  Filled 2016-11-15: qty 20

## 2016-11-15 MED ORDER — CEFAZOLIN SODIUM 1 G IJ SOLR
2.0000 g | Freq: Four times a day (QID) | INTRAMUSCULAR | Status: AC
  Administered 2016-11-15 – 2016-11-16 (×4): 2 g via INTRAVENOUS
  Filled 2016-11-15 (×4): qty 20

## 2016-11-15 MED ORDER — TRANEXAMIC ACID 1000 MG/10ML IV SOLN
1000.0000 mg | INTRAVENOUS | Status: AC
  Administered 2016-11-15: 1000 mg via INTRAVENOUS
  Filled 2016-11-15: qty 10

## 2016-11-15 MED ORDER — ZOLPIDEM TARTRATE 5 MG PO TABS
5.0000 mg | ORAL_TABLET | Freq: Every evening | ORAL | Status: DC | PRN
  Administered 2016-11-15: 5 mg via ORAL
  Filled 2016-11-15: qty 1

## 2016-11-15 MED ORDER — NEOMYCIN-POLYMYXIN B GU 40-200000 IR SOLN
Status: DC | PRN
  Administered 2016-11-15: 14 mL

## 2016-11-15 MED ORDER — PROPOFOL 500 MG/50ML IV EMUL
INTRAVENOUS | Status: DC | PRN
  Administered 2016-11-15: 70 ug/kg/min via INTRAVENOUS
  Administered 2016-11-15: 65 ug/kg/min via INTRAVENOUS

## 2016-11-15 MED ORDER — PROPOFOL 500 MG/50ML IV EMUL
INTRAVENOUS | Status: AC
  Filled 2016-11-15: qty 50

## 2016-11-15 MED ORDER — BISACODYL 10 MG RE SUPP: 10.0000 mg | Freq: Every day | RECTAL | Status: DC | PRN

## 2016-11-15 MED ORDER — PANTOPRAZOLE SODIUM 40 MG PO TBEC
40.0000 mg | DELAYED_RELEASE_TABLET | Freq: Two times a day (BID) | ORAL | Status: DC
  Administered 2016-11-15 – 2016-11-17 (×4): 40 mg via ORAL
  Filled 2016-11-15 (×4): qty 1

## 2016-11-15 MED ORDER — PROPOFOL 10 MG/ML IV BOLUS
INTRAVENOUS | Status: DC | PRN
  Administered 2016-11-15 (×2): 8.5 mg via INTRAVENOUS

## 2016-11-15 MED ORDER — ACETAMINOPHEN 325 MG PO TABS: 650.0000 mg | ORAL_TABLET | Freq: Four times a day (QID) | ORAL | Status: DC | PRN

## 2016-11-15 MED ORDER — DIPHENHYDRAMINE HCL 12.5 MG/5ML PO ELIX: 12.5000 mg | ORAL_SOLUTION | ORAL | Status: DC | PRN

## 2016-11-15 MED ORDER — FERROUS SULFATE 325 (65 FE) MG PO TABS
325.0000 mg | ORAL_TABLET | Freq: Two times a day (BID) | ORAL | Status: DC
  Administered 2016-11-15 – 2016-11-17 (×4): 325 mg via ORAL
  Filled 2016-11-15 (×4): qty 1

## 2016-11-15 MED ORDER — PHENYLEPHRINE HCL 10 MG/ML IJ SOLN
INTRAMUSCULAR | Status: AC
  Filled 2016-11-15: qty 1

## 2016-11-15 MED ORDER — OXYCODONE HCL 5 MG/5ML PO SOLN: 5.0000 mg | Freq: Once | ORAL | Status: DC | PRN

## 2016-11-15 MED ORDER — SODIUM CHLORIDE 0.9 % IJ SOLN
INTRAMUSCULAR | Status: AC
  Filled 2016-11-15: qty 50

## 2016-11-15 MED ORDER — TETRACAINE HCL 1 % IJ SOLN
INTRAMUSCULAR | Status: DC | PRN
  Administered 2016-11-15: 5 mg via INTRASPINAL

## 2016-11-15 MED ORDER — MIDAZOLAM HCL 5 MG/5ML IJ SOLN
INTRAMUSCULAR | Status: DC | PRN
  Administered 2016-11-15 (×2): 1 mg via INTRAVENOUS

## 2016-11-15 MED ORDER — BUPIVACAINE HCL (PF) 0.25 % IJ SOLN
INTRAMUSCULAR | Status: DC | PRN
  Administered 2016-11-15: 60 mL

## 2016-11-15 MED ORDER — CEFAZOLIN SODIUM-DEXTROSE 2-4 GM/100ML-% IV SOLN
INTRAVENOUS | Status: AC
  Filled 2016-11-15: qty 100

## 2016-11-15 MED ORDER — FENTANYL CITRATE (PF) 100 MCG/2ML IJ SOLN: 25.0000 ug | INTRAMUSCULAR | Status: DC | PRN

## 2016-11-15 MED ORDER — ONDANSETRON HCL 4 MG PO TABS: 4.0000 mg | ORAL_TABLET | Freq: Four times a day (QID) | ORAL | Status: DC | PRN

## 2016-11-15 MED ORDER — SENNOSIDES-DOCUSATE SODIUM 8.6-50 MG PO TABS
1.0000 | ORAL_TABLET | Freq: Two times a day (BID) | ORAL | Status: DC
  Administered 2016-11-15 – 2016-11-17 (×5): 1 via ORAL
  Filled 2016-11-15 (×5): qty 1

## 2016-11-15 MED ORDER — ENOXAPARIN SODIUM 30 MG/0.3ML ~~LOC~~ SOLN
30.0000 mg | Freq: Two times a day (BID) | SUBCUTANEOUS | Status: DC
  Administered 2016-11-16 – 2016-11-17 (×3): 30 mg via SUBCUTANEOUS
  Filled 2016-11-15 (×3): qty 0.3

## 2016-11-15 MED ORDER — COD LIVER OIL 1000 MG PO CAPS: 2.0000 | ORAL_CAPSULE | Freq: Every day | ORAL | Status: DC

## 2016-11-15 MED ORDER — GLYCOPYRROLATE 0.2 MG/ML IJ SOLN
INTRAMUSCULAR | Status: AC
  Filled 2016-11-15: qty 1

## 2016-11-15 MED ORDER — ADULT MULTIVITAMIN W/MINERALS CH
1.0000 | ORAL_TABLET | Freq: Every day | ORAL | Status: DC
  Administered 2016-11-16 – 2016-11-17 (×2): 1 via ORAL
  Filled 2016-11-15 (×2): qty 1

## 2016-11-15 MED ORDER — FAMOTIDINE 20 MG PO TABS
ORAL_TABLET | ORAL | Status: AC
  Administered 2016-11-15: 20 mg via ORAL
  Filled 2016-11-15: qty 1

## 2016-11-15 MED ORDER — PROPOFOL 10 MG/ML IV BOLUS
INTRAVENOUS | Status: AC
  Filled 2016-11-15: qty 20

## 2016-11-15 MED ORDER — MAGNESIUM HYDROXIDE 400 MG/5ML PO SUSP
30.0000 mL | Freq: Every day | ORAL | Status: DC | PRN
  Administered 2016-11-15 – 2016-11-17 (×3): 30 mL via ORAL
  Filled 2016-11-15 (×3): qty 30

## 2016-11-15 MED ORDER — SODIUM CHLORIDE 0.9 % IV SOLN
INTRAVENOUS | Status: DC | PRN
  Administered 2016-11-15: 5 ug/min via INTRAVENOUS

## 2016-11-15 MED ORDER — KETAMINE HCL 50 MG/ML IJ SOLN
INTRAMUSCULAR | Status: AC
  Filled 2016-11-15: qty 10

## 2016-11-15 MED ORDER — FAMOTIDINE 20 MG PO TABS
20.0000 mg | ORAL_TABLET | Freq: Once | ORAL | Status: AC
  Administered 2016-11-15: 20 mg via ORAL

## 2016-11-15 MED ORDER — FLEET ENEMA 7-19 GM/118ML RE ENEM: 1.0000 | ENEMA | Freq: Once | RECTAL | Status: DC | PRN

## 2016-11-15 MED ORDER — TRANEXAMIC ACID 1000 MG/10ML IV SOLN
1000.0000 mg | Freq: Once | INTRAVENOUS | Status: AC
  Administered 2016-11-15: 1000 mg via INTRAVENOUS
  Filled 2016-11-15: qty 10

## 2016-11-15 MED ORDER — PHENOL 1.4 % MT LIQD
1.0000 | OROMUCOSAL | Status: DC | PRN
  Filled 2016-11-15: qty 177

## 2016-11-15 MED ORDER — VITAMIN D3 25 MCG (1000 UNIT) PO TABS
5000.0000 [IU] | ORAL_TABLET | Freq: Every day | ORAL | Status: DC
  Administered 2016-11-16 – 2016-11-17 (×2): 5000 [IU] via ORAL
  Filled 2016-11-15 (×5): qty 5

## 2016-11-15 MED ORDER — VITAMIN C 500 MG PO TABS
1000.0000 mg | ORAL_TABLET | Freq: Every day | ORAL | Status: DC
  Administered 2016-11-16 – 2016-11-17 (×2): 1000 mg via ORAL
  Filled 2016-11-15 (×3): qty 2

## 2016-11-15 MED ORDER — OXYCODONE HCL 5 MG PO TABS
5.0000 mg | ORAL_TABLET | ORAL | Status: DC | PRN
  Administered 2016-11-15 (×2): 5 mg via ORAL
  Administered 2016-11-16 – 2016-11-17 (×4): 10 mg via ORAL
  Filled 2016-11-15 (×2): qty 1
  Filled 2016-11-15 (×4): qty 2

## 2016-11-15 MED ORDER — BUPIVACAINE LIPOSOME 1.3 % IJ SUSP
INTRAMUSCULAR | Status: AC
  Filled 2016-11-15: qty 20

## 2016-11-15 MED ORDER — FENTANYL CITRATE (PF) 100 MCG/2ML IJ SOLN
INTRAMUSCULAR | Status: DC | PRN
Start: 2016-11-15 — End: 2016-11-15
  Administered 2016-11-15: 50 ug via INTRAVENOUS

## 2016-11-15 MED ORDER — MIDAZOLAM HCL 2 MG/2ML IJ SOLN
INTRAMUSCULAR | Status: AC
  Filled 2016-11-15: qty 2

## 2016-11-15 MED ORDER — CELECOXIB 200 MG PO CAPS
200.0000 mg | ORAL_CAPSULE | Freq: Two times a day (BID) | ORAL | Status: DC
  Administered 2016-11-15 – 2016-11-17 (×4): 200 mg via ORAL
  Filled 2016-11-15 (×4): qty 1

## 2016-11-15 MED ORDER — METOCLOPRAMIDE HCL 10 MG PO TABS
10.0000 mg | ORAL_TABLET | Freq: Three times a day (TID) | ORAL | Status: AC
  Administered 2016-11-15 – 2016-11-16 (×7): 10 mg via ORAL
  Filled 2016-11-15 (×8): qty 1

## 2016-11-15 MED ORDER — CEFAZOLIN SODIUM-DEXTROSE 2-4 GM/100ML-% IV SOLN
2.0000 g | Freq: Four times a day (QID) | INTRAVENOUS | Status: DC
  Filled 2016-11-15 (×3): qty 100

## 2016-11-15 MED ORDER — LACTATED RINGERS IV SOLN
INTRAVENOUS | Status: DC
  Administered 2016-11-15 (×2): via INTRAVENOUS

## 2016-11-15 MED ORDER — SODIUM CHLORIDE 0.9 % IV SOLN
INTRAVENOUS | Status: DC | PRN
  Administered 2016-11-15: 7 ug/kg/min via INTRAVENOUS

## 2016-11-15 MED ORDER — CHLORHEXIDINE GLUCONATE 4 % EX LIQD: 60.0000 mL | Freq: Once | CUTANEOUS | Status: DC

## 2016-11-15 MED ORDER — HYDROCHLOROTHIAZIDE 12.5 MG PO CAPS
12.5000 mg | ORAL_CAPSULE | Freq: Every day | ORAL | Status: DC
  Administered 2016-11-15 – 2016-11-17 (×3): 12.5 mg via ORAL
  Filled 2016-11-15 (×3): qty 1

## 2016-11-15 MED ORDER — ACETAMINOPHEN 650 MG RE SUPP: 650.0000 mg | Freq: Four times a day (QID) | RECTAL | Status: DC | PRN

## 2016-11-15 MED ORDER — ALUM & MAG HYDROXIDE-SIMETH 200-200-20 MG/5ML PO SUSP: 30.0000 mL | ORAL | Status: DC | PRN

## 2016-11-15 MED ORDER — MORPHINE SULFATE (PF) 2 MG/ML IV SOLN
2.0000 mg | INTRAVENOUS | Status: DC | PRN
  Administered 2016-11-15: 2 mg via INTRAVENOUS
  Filled 2016-11-15: qty 1

## 2016-11-15 MED ORDER — LOSARTAN POTASSIUM-HCTZ 50-12.5 MG PO TABS
1.0000 | ORAL_TABLET | Freq: Every day | ORAL | Status: DC
Start: 2016-11-15 — End: 2016-11-15

## 2016-11-15 MED ORDER — FENTANYL CITRATE (PF) 100 MCG/2ML IJ SOLN
INTRAMUSCULAR | Status: AC
Start: 2016-11-15 — End: 2016-11-15
  Filled 2016-11-15: qty 2

## 2016-11-15 MED ORDER — TAMSULOSIN HCL 0.4 MG PO CAPS
0.4000 mg | ORAL_CAPSULE | Freq: Every day | ORAL | Status: DC
  Administered 2016-11-16 – 2016-11-17 (×2): 0.4 mg via ORAL
  Filled 2016-11-15 (×2): qty 1

## 2016-11-15 MED ORDER — BUPIVACAINE HCL (PF) 0.5 % IJ SOLN
INTRAMUSCULAR | Status: DC | PRN
  Administered 2016-11-15: 2.5 mL

## 2016-11-15 MED ORDER — CEFAZOLIN SODIUM-DEXTROSE 2-4 GM/100ML-% IV SOLN
2.0000 g | INTRAVENOUS | Status: AC
Start: 2016-11-15 — End: 2016-11-15
  Administered 2016-11-15: 2 g via INTRAVENOUS

## 2016-11-15 MED ORDER — DEXAMETHASONE SODIUM PHOSPHATE 10 MG/ML IJ SOLN
INTRAMUSCULAR | Status: AC
  Filled 2016-11-15: qty 1

## 2016-11-15 MED ORDER — LIDOCAINE HCL (PF) 2 % IJ SOLN
INTRAMUSCULAR | Status: AC
  Filled 2016-11-15: qty 10

## 2016-11-15 MED ORDER — DEXAMETHASONE SODIUM PHOSPHATE 4 MG/ML IJ SOLN
INTRAMUSCULAR | Status: DC | PRN
  Administered 2016-11-15: 5 mg via INTRAVENOUS

## 2016-11-15 MED ORDER — ACETAMINOPHEN 10 MG/ML IV SOLN
INTRAVENOUS | Status: AC
  Filled 2016-11-15: qty 100

## 2016-11-15 MED ORDER — ACETAMINOPHEN 10 MG/ML IV SOLN
1000.0000 mg | Freq: Four times a day (QID) | INTRAVENOUS | Status: AC
  Administered 2016-11-15 – 2016-11-16 (×3): 1000 mg via INTRAVENOUS
  Filled 2016-11-15 (×4): qty 100

## 2016-11-15 MED ORDER — MENTHOL 3 MG MT LOZG
1.0000 | LOZENGE | OROMUCOSAL | Status: DC | PRN
  Filled 2016-11-15: qty 9

## 2016-11-15 MED ORDER — OXYCODONE HCL 5 MG PO TABS: 5.0000 mg | ORAL_TABLET | Freq: Once | ORAL | Status: DC | PRN

## 2016-11-15 MED ORDER — ACETAMINOPHEN 10 MG/ML IV SOLN
INTRAVENOUS | Status: DC | PRN
  Administered 2016-11-15: 1000 mg via INTRAVENOUS

## 2016-11-15 MED ORDER — BUPIVACAINE HCL (PF) 0.25 % IJ SOLN
INTRAMUSCULAR | Status: AC
  Filled 2016-11-15: qty 40

## 2016-11-15 MED ORDER — BUPIVACAINE HCL (PF) 0.25 % IJ SOLN
INTRAMUSCULAR | Status: AC
  Filled 2016-11-15: qty 20

## 2016-11-15 MED ORDER — LOSARTAN POTASSIUM 50 MG PO TABS
50.0000 mg | ORAL_TABLET | Freq: Every day | ORAL | Status: DC
  Administered 2016-11-15 – 2016-11-17 (×3): 50 mg via ORAL
  Filled 2016-11-15 (×3): qty 1

## 2016-11-15 MED ORDER — MAGNESIUM OXIDE 400 (241.3 MG) MG PO TABS
400.0000 mg | ORAL_TABLET | Freq: Every day | ORAL | Status: DC
  Administered 2016-11-16 – 2016-11-17 (×2): 400 mg via ORAL
  Filled 2016-11-15 (×2): qty 1

## 2016-11-15 MED ORDER — KETAMINE HCL 50 MG/ML IJ SOLN
INTRAMUSCULAR | Status: DC | PRN
  Administered 2016-11-15 (×2): .85 mg via INTRAMUSCULAR

## 2016-11-15 SURGICAL SUPPLY — 70 items
BATTERY INSTRU NAVIGATION (MISCELLANEOUS) ×12 IMPLANT
BLADE SAW 1 (BLADE) ×3 IMPLANT
BLADE SAW 1/2 (BLADE) ×3 IMPLANT
BLADE SAW 70X12.5 (BLADE) IMPLANT
BTRY SRG DRVR LF (MISCELLANEOUS) ×4
CANISTER SUCT 1200ML W/VALVE (MISCELLANEOUS) ×3 IMPLANT
CANISTER SUCT 3000ML PPV (MISCELLANEOUS) ×6 IMPLANT
CAP KNEE TOTAL 3 SIGMA ×2 IMPLANT
CATH TRAY METER 16FR LF (MISCELLANEOUS) ×3 IMPLANT
CEMENT HV SMART SET (Cement) ×6 IMPLANT
COOLER POLAR GLACIER W/PUMP (MISCELLANEOUS) ×1 IMPLANT
CUFF TOURN 24 STER (MISCELLANEOUS) IMPLANT
CUFF TOURN 30 STER DUAL PORT (MISCELLANEOUS) ×2 IMPLANT
DRAPE SHEET LG 3/4 BI-LAMINATE (DRAPES) ×3 IMPLANT
DRESSING ALLEVYN LIFE SACRUM (GAUZE/BANDAGES/DRESSINGS) ×2 IMPLANT
DRSG DERMACEA 8X12 NADH (GAUZE/BANDAGES/DRESSINGS) ×3 IMPLANT
DRSG OPSITE POSTOP 4X14 (GAUZE/BANDAGES/DRESSINGS) ×3 IMPLANT
DRSG TEGADERM 4X4.75 (GAUZE/BANDAGES/DRESSINGS) ×3 IMPLANT
DURAPREP 26ML APPLICATOR (WOUND CARE) ×6 IMPLANT
ELECT CAUTERY BLADE 6.4 (BLADE) ×3 IMPLANT
ELECT REM PT RETURN 9FT ADLT (ELECTROSURGICAL) ×3
ELECTRODE REM PT RTRN 9FT ADLT (ELECTROSURGICAL) ×1 IMPLANT
EVACUATOR 1/8 PVC DRAIN (DRAIN) ×3 IMPLANT
EX-PIN ORTHOLOCK NAV 4X150 (PIN) ×6 IMPLANT
GLOVE BIO SURGEON STRL SZ7 (GLOVE) ×4 IMPLANT
GLOVE BIOGEL M STRL SZ7.5 (GLOVE) ×6 IMPLANT
GLOVE BIOGEL PI IND STRL 7.0 (GLOVE) IMPLANT
GLOVE BIOGEL PI IND STRL 9 (GLOVE) ×1 IMPLANT
GLOVE BIOGEL PI INDICATOR 7.0 (GLOVE) ×4
GLOVE BIOGEL PI INDICATOR 9 (GLOVE) ×2
GLOVE INDICATOR 8.0 STRL GRN (GLOVE) ×3 IMPLANT
GLOVE SURG SYN 9.0  PF PI (GLOVE) ×4
GLOVE SURG SYN 9.0 PF PI (GLOVE) ×1 IMPLANT
GOWN STRL REUS W/ TWL LRG LVL3 (GOWN DISPOSABLE) ×2 IMPLANT
GOWN STRL REUS W/TWL 2XL LVL3 (GOWN DISPOSABLE) ×3 IMPLANT
GOWN STRL REUS W/TWL LRG LVL3 (GOWN DISPOSABLE) ×9
HOLDER FOLEY CATH W/STRAP (MISCELLANEOUS) ×3 IMPLANT
HOOD PEEL AWAY FLYTE STAYCOOL (MISCELLANEOUS) ×6 IMPLANT
KIT RM TURNOVER STRD PROC AR (KITS) ×3 IMPLANT
KNIFE SCULPS 14X20 (INSTRUMENTS) ×3 IMPLANT
LABEL OR SOLS (LABEL) ×3 IMPLANT
NDL SAFETY 18GX1.5 (NEEDLE) ×3 IMPLANT
NDL SPNL 20GX3.5 QUINCKE YW (NEEDLE) ×2 IMPLANT
NEEDLE SPNL 20GX3.5 QUINCKE YW (NEEDLE) ×6 IMPLANT
NS IRRIG 500ML POUR BTL (IV SOLUTION) ×3 IMPLANT
PACK TOTAL KNEE (MISCELLANEOUS) ×3 IMPLANT
PAD WRAPON POLAR KNEE (MISCELLANEOUS) ×1 IMPLANT
PIN DRILL QUICK PACK ×3 IMPLANT
PIN FIXATION 1/8DIA X 3INL (PIN) ×3 IMPLANT
PULSAVAC PLUS IRRIG FAN TIP (DISPOSABLE) ×3
SOL .9 NS 3000ML IRR  AL (IV SOLUTION) ×2
SOL .9 NS 3000ML IRR AL (IV SOLUTION) ×1
SOL .9 NS 3000ML IRR UROMATIC (IV SOLUTION) ×1 IMPLANT
SOL PREP PVP 2OZ (MISCELLANEOUS) ×3
SOLUTION PREP PVP 2OZ (MISCELLANEOUS) ×1 IMPLANT
SPONGE DRAIN TRACH 4X4 STRL 2S (GAUZE/BANDAGES/DRESSINGS) ×3 IMPLANT
SPONGE LAP 18X18 5 PK (GAUZE/BANDAGES/DRESSINGS) ×2 IMPLANT
STAPLER SKIN PROX 35W (STAPLE) ×3 IMPLANT
STRAP TIBIA SHORT (MISCELLANEOUS) ×3 IMPLANT
SUCTION FRAZIER HANDLE 10FR (MISCELLANEOUS) ×2
SUCTION TUBE FRAZIER 10FR DISP (MISCELLANEOUS) ×1 IMPLANT
SUT VIC AB 0 CT1 36 (SUTURE) ×3 IMPLANT
SUT VIC AB 1 CT1 36 (SUTURE) ×6 IMPLANT
SUT VIC AB 2-0 CT2 27 (SUTURE) ×3 IMPLANT
SYR 20CC LL (SYRINGE) ×3 IMPLANT
SYR 30ML LL (SYRINGE) ×6 IMPLANT
TIP FAN IRRIG PULSAVAC PLUS (DISPOSABLE) ×1 IMPLANT
TOWEL OR 17X26 4PK STRL BLUE (TOWEL DISPOSABLE) ×3 IMPLANT
TOWER CARTRIDGE SMART MIX (DISPOSABLE) ×3 IMPLANT
WRAPON POLAR PAD KNEE (MISCELLANEOUS) ×3

## 2016-11-15 NOTE — NC FL2 (Signed)
[place] LEVEL OF CARE SCREENING TOOL     IDENTIFICATION  Patient Name: Leonard Stokes Birthdate: 06-15-1945 Sex: male Admission Date (Current Location): 11/15/2016  [place] and [place] Number:  Engineering geologist and Address:  [facility], [address] [place], [place] [address]      Provider Number: [id number]  Attending Physician Name and Address:  Dereck Leep, MD  Relative Name and Phone Number:       Current Level of Care: Hospital Recommended Level of Care: [place] Prior Approval Number:    Date Approved/Denied:   PASRR Number:  [id number])  Discharge Plan: SNF    Current Diagnoses: Patient Active Problem List   Diagnosis Date Noted  . 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 Personal history of tobacco use, presenting hazards to health 09/26/2014    Orientation RESPIRATION BLADDER Height & Weight     Self, Time, Situation, Place  Normal Continent Weight: 185 lb (83.9 kg) Height:  6' (182.9 cm)  BEHAVIORAL SYMPTOMS/MOOD NEUROLOGICAL BOWEL NUTRITION STATUS      Continent Diet (DietL Heart Healthy )  AMBULATORY STATUS COMMUNICATION OF NEEDS Skin   Extensive Assist Verbally Surgical wounds (Incision: Left Knee )                       Personal Care Assistance Level of Assistance  Bathing, Feeding, Dressing Bathing Assistance: Limited assistance Feeding assistance: Independent Dressing Assistance: Limited assistance     Functional Limitations Info  Sight, Hearing, Speech Sight Info: Adequate Hearing Info: Adequate Speech Info: Adequate    SPECIAL CARE FACTORS FREQUENCY  PT (By licensed PT), OT (By licensed OT)     PT Frequency:  (5) OT Frequency:  (5)            Contractures      Additional Factors Info  Code Status, Allergies Code Status Info:  (Full Code. ) Allergies Info:  (Codeine. )           Current Medications (11/15/2016):  This is the current hospital  active medication list Current Facility-Administered Medications  Medication Dose Route Frequency Provider Last Rate Last Dose  . 0.9 %  sodium chloride infusion   Intravenous Continuous Hooten, Laurice Record, MD 100 mL/hr at 11/15/16 1200    . acetaminophen (OFIRMEV) IV 1,000 mg  1,000 mg Intravenous Q6H Hooten, Laurice Record, MD   Stopped at 11/15/16 1310  . [START ON 11/16/2016] acetaminophen (TYLENOL) tablet 650 mg  650 mg Oral Q6H PRN Hooten, Laurice Record, MD       Or  . Derrill Memo ON 11/16/2016] acetaminophen (TYLENOL) suppository 650 mg  650 mg Rectal Q6H PRN Hooten, Laurice Record, MD      . alum & mag hydroxide-simeth (MAALOX/MYLANTA) 200-200-20 MG/5ML suspension 30 mL  30 mL Oral Q4H PRN Hooten, Laurice Record, MD      . bisacodyl (DULCOLAX) suppository 10 mg  10 mg Rectal Daily PRN Hooten, Laurice Record, MD      . ceFAZolin (ANCEF) 2 g in dextrose 5 % 100 mL IVPB  2 g Intravenous Q6H Dereck Leep, MD 240 mL/hr at 11/15/16 1509 2 g at 11/15/16 1509  . celecoxib (CELEBREX) capsule 200 mg  200 mg Oral Q12H Hooten, Laurice Record, MD      . cholecalciferol (VITAMIN D) tablet 5,000 Units  5,000 Units Oral Daily Hooten, Laurice Record, MD      . Cod Liver Oil CAPS 2,000 mg  2 capsule Oral Daily Hooten, Laurice Record, MD      . diphenhydrAMINE (BENADRYL) 12.5 MG/5ML elixir 12.5-25 mg  12.5-25 mg Oral Q4H PRN Hooten, Laurice Record, MD      . Derrill Memo ON 11/16/2016] enoxaparin (LOVENOX) injection 30 mg  30 mg Subcutaneous Q12H Hooten, Laurice Record, MD      . ferrous sulfate tablet 325 mg  325 mg Oral BID WC Hooten, Laurice Record, MD      . losartan (COZAAR) tablet 50 mg  50 mg Oral Daily Hooten, Laurice Record, MD   50 mg at 11/15/16 1510   And  . hydrochlorothiazide (MICROZIDE) capsule 12.5 mg  12.5 mg Oral Daily Dereck Leep, MD   12.5 mg at 11/15/16 1510  . magnesium hydroxide (MILK OF MAGNESIA) suspension 30 mL  30 mL Oral Daily PRN Hooten, Laurice Record, MD      . magnesium oxide (MAG-OX) tablet 400 mg  400 mg Oral Daily Hooten, Laurice Record, MD      . menthol-cetylpyridinium  (CEPACOL) lozenge 3 mg  1 lozenge Oral PRN Hooten, Laurice Record, MD       Or  . phenol (CHLORASEPTIC) mouth spray 1 spray  1 spray Mouth/Throat PRN Hooten, Laurice Record, MD      . metoCLOPramide (REGLAN) tablet 10 mg  10 mg Oral TID AC & HS Hooten, Laurice Record, MD   10 mg at 11/15/16 1354  . morphine 2 MG/ML injection 2 mg  2 mg Intravenous Q2H PRN Hooten, Laurice Record, MD      . multivitamin with minerals tablet 1 tablet  1 tablet Oral Daily Hooten, Laurice Record, MD      . ondansetron (ZOFRAN) tablet 4 mg  4 mg Oral Q6H PRN Hooten, Laurice Record, MD       Or  . ondansetron (ZOFRAN) injection 4 mg  4 mg Intravenous Q6H PRN Hooten, Laurice Record, MD      . oxyCODONE (Oxy IR/ROXICODONE) immediate release tablet 5-10 mg  5-10 mg Oral Q4H PRN Hooten, Laurice Record, MD   5 mg at 11/15/16 1355  . pantoprazole (PROTONIX) EC tablet 40 mg  40 mg Oral BID Hooten, Laurice Record, MD      . senna-docusate (Senokot-S) tablet 1 tablet  1 tablet Oral BID Hooten, Laurice Record, MD      . sodium phosphate (FLEET) 7-19 GM/118ML enema 1 enema  1 enema Rectal Once PRN Hooten, Laurice Record, MD      . tamsulosin (FLOMAX) capsule 0.4 mg  0.4 mg Oral Daily Hooten, Laurice Record, MD      . traMADol (ULTRAM) tablet 50-100 mg  50-100 mg Oral Q4H PRN Hooten, Laurice Record, MD      . vitamin C (ASCORBIC ACID) tablet 1,000 mg  1,000 mg Oral Daily Hooten, Laurice Record, MD      . zolpidem (AMBIEN) tablet 5 mg  5 mg Oral QHS PRN Hooten, Laurice Record, MD         Discharge Medications: Please see discharge summary for a list of discharge medications.  Relevant Imaging Results:  Relevant Lab Results:   Additional Information  (SSN: [id number])  Kuron Docken, Veronia Beets, LCSW

## 2016-11-15 NOTE — Care Management (Signed)
RNCM met briefly with patient to explain my role in his care with home health, equipment needs, and Lovenox. PT was working with patient during my visit. I have provided patient with a list of home health agencies for his review. He wants the same agency but cannot remember their name. He uses [facility] [phone] for medications.  I have called Lovenox 47m injection daily for 14 days with no refills to this pharmacy per Dr. JEdwyna Perfectrequest.  RNCM will continue to follow.

## 2016-11-15 NOTE — Anesthesia Procedure Notes (Signed)
Spinal  Patient location during procedure: OR Start time: 11/15/2016 7:22 AM End time: 11/15/2016 7:26 AM Staffing Performed: resident/CRNA  Preanesthetic Checklist Completed: patient identified, site marked, surgical consent, pre-op evaluation, timeout performed, IV checked, risks and benefits discussed and monitors and equipment checked Spinal Block Patient position: sitting Prep: Betadine Patient monitoring: heart rate, continuous pulse ox, blood pressure and cardiac monitor Approach: midline Location: L4-5 Injection technique: single-shot Needle Needle type: Introducer and Pencan  Needle gauge: 24 G Needle length: 9 cm Additional Notes Negative paresthesia. Negative blood return. Positive free-flowing CSF. Expiration date of kit checked and confirmed. Patient tolerated procedure well, without complications.

## 2016-11-15 NOTE — H&P (Signed)
The patient has been re-examined, and the chart reviewed, and there have been no interval changes to the documented history and physical.    The risks, benefits, and alternatives have been discussed at length. The patient expressed understanding of the risks benefits and agreed with plans for surgical intervention.  Leonard Stokes P. Leonard Stokes, Jr. M.D.    

## 2016-11-15 NOTE — Evaluation (Signed)
Physical Therapy Evaluation Patient Details Name: Leonard Stokes MRN: [id number] DOB: [birth year] Today's Date: 11/15/2016   History of Present Illness  admitted for acute hospitalization status post L TKR (11/15/16), WBAT.  Clinical Impression  Upon evaluation, patient alert and oriented; follows all commands and demonstrates excellent effort with all therapeutic tasks.  L LE strength at least 3-/5 with ROM 2-80 degrees (Act assist); generally guarded due to post-op soreness.  Demonstrates good L quad activation and control, completing SLR without difficulty, minimal/no lag noted.  Able to complete bed mobility with mod indep; sit/stand, basic transfers and gait (75') with RW, cga.  Good tolerance for WBing L LE; no evidence of buckling with mobility/WBing efforts.  Anticipate consistent progress towards all goals. Would benefit from skilled PT to address above deficits and promote optimal return to PLOF; recommend transition to home with outpatient PT follow up as appropriate.    Follow Up Recommendations Outpatient PT    Equipment Recommendations  Rolling walker with 5" wheels    Recommendations for Other Services       Precautions / Restrictions Precautions Precautions: Fall Restrictions Weight Bearing Restrictions: Yes LLE Weight Bearing: Weight bearing as tolerated      Mobility  Bed Mobility Overal bed mobility: Modified Independent                Transfers Overall transfer level: Needs assistance Equipment used: Rolling walker (2 wheeled) Transfers: Sit to/from Stand Sit to Stand: Min guard         General transfer comment: cuing for hand placement; maintains L LE anterior to BOS  Ambulation/Gait Ambulation/Gait assistance: Min guard Ambulation Distance (Feet): 75 Feet Assistive device: Rolling walker (2 wheeled)       General Gait Details: step to progressing to step through gait pattern; good L LE knee control, but decreased stance time (with  decreased R LE step height/length as result).  Slow, guarded gait speed  Stairs            Wheelchair Mobility    Modified Rankin (Stroke Patients Only)       Balance Overall balance assessment: Needs assistance Sitting-balance support: No upper extremity supported;Feet supported Sitting balance-Leahy Scale: Good     Standing balance support: Bilateral upper extremity supported Standing balance-Leahy Scale: Fair                               Pertinent Vitals/Pain Pain Assessment: 0-10 Pain Score: 5  Pain Descriptors / Indicators: Aching;Operative site guarding Pain Intervention(s): Limited activity within patient's tolerance;Monitored during session;Premedicated before session;Repositioned    Home Living Family/patient expects to be discharged to:: Private residence Living Arrangements: Spouse/significant other Available Help at Discharge: Family Type of Home: House Home Access: Stairs to enter Entrance Stairs-Rails: Right Entrance Stairs-Number of Steps: 2 Home Layout: Two level;Able to live on main level with bedroom/bathroom Home Equipment: Walker - standard      Prior Function Level of Independence: Independent with assistive device(s)         Comments: Indep without assist device for ADLs, household and community mobility; intermittent use of SPC when knee bothering him.  Denies fall history.  Stays active, enjoys volunteering at [employer].     Hand Dominance   Dominant Hand: Right    Extremity/Trunk Assessment   Upper Extremity Assessment Upper Extremity Assessment: Overall WFL for tasks assessed    Lower Extremity Assessment Lower Extremity Assessment:  (L knee grossly  3-/5, limited by post-op pain, otherwise bilat LEs grossly 4+/5 throughout.  Sensation fully intact)       Communication   Communication: No difficulties  Cognition Arousal/Alertness: Awake/alert Behavior During Therapy: WFL for tasks  assessed/performed Overall Cognitive Status: Within Functional Limits for tasks assessed                                        General Comments      Exercises Total Joint Exercises Goniometric ROM: L knee: 2-80 degrees, act assist  Other Exercises Other Exercises: Supine L LE therex, 1x10, AROM: ankle pumps, quad sets, SAQs, hip abduct/adduct and SLR.  Excellent L quad activation and control; good L LE SLR With little/no lag noted.   Assessment/Plan    PT Assessment Patient needs continued PT services  PT Problem List Decreased strength;Decreased range of motion;Decreased activity tolerance;Decreased balance;Decreased mobility;Decreased safety awareness;Decreased knowledge of use of DME       PT Treatment Interventions DME instruction;Gait training;Stair training;Functional mobility training;Therapeutic activities;Therapeutic exercise;Balance training;Patient/family education    PT Goals (Current goals can be found in the Care Plan section)  Acute Rehab PT Goals Patient Stated Goal: to keep going and stay active PT Goal Formulation: With patient/family Time For Goal Achievement: 11/29/16 Potential to Achieve Goals: Good    Frequency BID   Barriers to discharge        Co-evaluation               AM-PAC PT "6 Clicks" Daily Activity  Outcome Measure Difficulty turning over in bed (including adjusting bedclothes, sheets and blankets)?: A Little Difficulty moving from lying on back to sitting on the side of the bed? : A Little Difficulty sitting down on and standing up from a chair with arms (e.g., wheelchair, bedside commode, etc,.)?: Unable Help needed moving to and from a bed to chair (including a wheelchair)?: A Little Help needed walking in hospital room?: A Little Help needed climbing 3-5 steps with a railing? : A Lot 6 Click Score: 15    End of Session   Activity Tolerance: Patient tolerated treatment well Patient left: in chair;with call  bell/phone within reach;with chair alarm set;with family/visitor present Nurse Communication: Mobility status PT Visit Diagnosis: Muscle weakness (generalized) (M62.81);Difficulty in walking, not elsewhere classified (R26.2);Pain Pain - Right/Left: Left Pain - part of body: Knee    Time: 3295-1884 PT Time Calculation (min) (ACUTE ONLY): 33 min   Charges:   PT Evaluation $PT Eval Low Complexity: 1 Low PT Treatments $Therapeutic Exercise: 8-22 mins   PT G Codes:   PT G-Codes **NOT FOR INPATIENT CLASS** Functional Assessment Tool Used: AM-PAC 6 Clicks Basic Mobility Functional Limitation: Mobility: Walking and moving around Mobility: Walking and Moving Around Current Status (Z6606): At least 20 percent but less than 40 percent impaired, limited or restricted Mobility: Walking and Moving Around Goal Status [id number]): At least 1 percent but less than 20 percent impaired, limited or restricted      Alegandra Sommers H. Owens Shark, PT, DPT, NCS 11/15/16, 10:12 PM [phone]

## 2016-11-15 NOTE — Op Note (Signed)
OPERATIVE NOTE  DATE OF SURGERY:  11/15/2016  PATIENT NAME:  Leonard Stokes   DOB: October 25, 1945  MRN: [id number]  PRE-OPERATIVE DIAGNOSIS: Degenerative arthrosis of the left knee, primary  POST-OPERATIVE DIAGNOSIS:  Same  PROCEDURE:  Left total knee arthroplasty using computer-assisted navigation  SURGEON:  Jena Gauss. M.D.  Stokes:  Van Clines, PA (present and scrubbed throughout the case, critical for assistance with exposure, retraction, instrumentation, and closure)  ANESTHESIA: spinal  ESTIMATED BLOOD LOSS: 50 mL  FLUIDS REPLACED: 1000 mL of crystalloid  TOURNIQUET TIME: 86 minutes  DRAINS: 2 medium Hemovac drains  SOFT TISSUE RELEASES: Anterior cruciate ligament, posterior cruciate ligament, deep medial collateral ligament, patellofemoral ligament  IMPLANTS UTILIZED: DePuy PFC Sigma size 4 posterior stabilized femoral component (cemented), size 4 MBT tibial component (cemented), 38 mm 3 peg oval dome patella (cemented), and a 12.5 mm stabilized rotating platform polyethylene insert.  INDICATIONS FOR SURGERY: Leonard Stokes is a 71 y.o. year old male with a long history of progressive knee pain. X-rays demonstrated severe degenerative changes in tricompartmental fashion. The patient had not seen any significant improvement despite conservative nonsurgical intervention. After discussion of the risks and benefits of surgical intervention, the patient expressed understanding of the risks benefits and agree with plans for total knee arthroplasty.   The risks, benefits, and alternatives were discussed at length including but not limited to the risks of infection, bleeding, nerve injury, stiffness, blood clots, the need for revision surgery, cardiopulmonary complications, among others, and they were willing to proceed.  PROCEDURE IN DETAIL: The patient was brought into the operating room and, after adequate spinal anesthesia was achieved, a tourniquet was placed on the  patient's upper thigh. The patient's knee and leg were cleaned and prepped with alcohol and DuraPrep and draped in the usual sterile fashion. A "timeout" was performed as per usual protocol. The lower extremity was exsanguinated using an Esmarch, and the tourniquet was inflated to 300 mmHg. An anterior longitudinal incision was made followed by a standard mid vastus approach. The deep fibers of the medial collateral ligament were elevated in a subperiosteal fashion off of the medial flare of the tibia so as to maintain a continuous soft tissue sleeve. The patella was subluxed laterally and the patellofemoral ligament was incised. Inspection of the knee demonstrated severe degenerative changes with full-thickness loss of articular cartilage. Osteophytes were debrided using a rongeur. Anterior and posterior cruciate ligaments were excised. Two 4.0 mm Schanz pins were inserted in the femur and into the tibia for attachment of the array of trackers used for computer-assisted navigation. Hip center was identified using a circumduction technique. Distal landmarks were mapped using the computer. The distal femur and proximal tibia were mapped using the computer. The distal femoral cutting guide was positioned using computer-assisted navigation so as to achieve a 5 distal valgus cut. The femur was sized and it was felt that a size 4 femoral component was appropriate. A size 4 femoral cutting guide was positioned and the anterior cut was performed and verified using the computer. This was followed by completion of the posterior and chamfer cuts. Femoral cutting guide for the central box was then positioned in the center box cut was performed.  Attention was then directed to the proximal tibia. Medial and lateral menisci were excised. The extramedullary tibial cutting guide was positioned using computer-assisted navigation so as to achieve a 0 varus-valgus alignment and 0 posterior slope. The cut was performed and  verified using the computer. The  proximal tibia was sized and it was felt that a size 4 tibial tray was appropriate. Tibial and femoral trials were inserted followed by insertion of a 12.5 mm polyethylene insert. This allowed for excellent mediolateral soft tissue balancing both in flexion and in full extension. Finally, the patella was cut and prepared so as to accommodate a 38 mm 3 peg oval dome patella. A patella trial was placed and the knee was placed through a range of motion with excellent patellar tracking appreciated. The femoral trial was removed after debridement of posterior osteophytes. The central post-hole for the tibial component was reamed followed by insertion of a keel punch. Tibial trials were then removed. Cut surfaces of bone were irrigated with copious amounts of normal saline with antibiotic solution using pulsatile lavage and then suctioned dry. Polymethylmethacrylate cement was prepared in the usual fashion using a vacuum mixer. Cement was applied to the cut surface of the proximal tibia as well as along the undersurface of a size 4 MBT tibial component. Tibial component was positioned and impacted into place. Excess cement was removed using Leonard Stokes. Cement was then applied to the cut surfaces of the femur as well as along the posterior flanges of the size 4 femoral component. The femoral component was positioned and impacted into place. Excess cement was removed using Leonard Stokes. A 12.5 mm polyethylene trial was inserted and the knee was brought into full extension with steady axial compression applied. Finally, cement was applied to the backside of a 38 mm 3 peg oval dome patella and the patellar component was positioned and patellar clamp applied. Excess cement was removed using Leonard Stokes. After adequate curing of the cement, the tourniquet was deflated after a total tourniquet time of 86 minutes. Hemostasis was achieved using electrocautery. The knee was irrigated  with copious amounts of normal saline with antibiotic solution using pulsatile lavage and then suctioned dry. 20 mL of 1.3% Exparel and 60 mL of 0.25% Marcaine in 40 mL of normal saline was injected along the posterior capsule, medial and lateral gutters, and along the arthrotomy site. A 12.5 mm stabilized rotating platform polyethylene insert was inserted and the knee was placed through a range of motion with excellent mediolateral soft tissue balancing appreciated and excellent patellar tracking noted. 2 medium drains were placed in the wound bed and brought out through separate stab incisions. The medial parapatellar portion of the incision was reapproximated using interrupted sutures of #1 Vicryl. Subcutaneous tissue was approximated in layers using first #0 Vicryl followed #2-0 Vicryl. The skin was approximated with skin staples. A sterile dressing was applied.  The patient tolerated the procedure well and was transported to the recovery room in stable condition.    Leonard Baskette P. Angie Fava., M.D.

## 2016-11-15 NOTE — Transfer of Care (Signed)
Immediate Anesthesia Transfer of Care Note  Patient: Leonard Stokes  Procedure(s) Performed: COMPUTER ASSISTED TOTAL KNEE ARTHROPLASTY (Left Knee)  Patient Location: PACU  Anesthesia Type:Spinal  Level of Consciousness: awake, oriented and patient cooperative  Airway & Oxygen Therapy: Patient Spontanous Breathing and Patient connected to nasal cannula oxygen  Post-op Assessment: Report given to RN and Post -op Vital signs reviewed and stable  Post vital signs: Reviewed and stable  Last Vitals:  Vitals:   11/15/16 0609  BP: (!) 160/87  Pulse: 72  Resp: 18  Temp: 37.1 C  SpO2: 99%    Last Pain:  Vitals:   11/15/16 0609  TempSrc: Tympanic         Complications: No apparent anesthesia complications

## 2016-11-15 NOTE — Anesthesia Post-op Follow-up Note (Signed)
Anesthesia QCDR form completed.        

## 2016-11-15 NOTE — Anesthesia Preprocedure Evaluation (Signed)
Anesthesia Evaluation  Patient identified by MRN, date of birth, ID band Patient awake    Reviewed: Allergy & Precautions, H&P , NPO status , Patient's Chart, lab work & pertinent test results  History of Anesthesia Complications (+) PONV and history of anesthetic complications  Airway Mallampati: III  TM Distance: <3 FB Neck ROM: limited    Dental  (+) Poor Dentition, Chipped, Caps   Pulmonary neg shortness of breath, former smoker,           Cardiovascular Exercise Tolerance: Good (-) angina+ Peripheral Vascular Disease  (-) Past MI      Neuro/Psych negative neurological ROS  negative psych ROS   GI/Hepatic negative GI ROS, Neg liver ROS, neg GERD  ,  Endo/Other  negative endocrine ROS  Renal/GU Renal disease     Musculoskeletal  (+) Arthritis ,   Abdominal   Peds  Hematology negative hematology ROS (+)   Anesthesia Other Findings Past Medical History: No date: Arthritis 2018: Atherosclerosis of aorta (HCC) No date: BPH (benign prostatic hyperplasia) 08/2016: Chronic kidney disease     Comment:  kidney stone No date: Diverticulosis of sigmoid colon No date: Leg cramps  Past Surgical History: No date: APPENDECTOMY 08/19/2016: EXTRACORPOREAL SHOCK WAVE LITHOTRIPSY; Right     Comment:  Procedure: EXTRACORPOREAL SHOCK WAVE LITHOTRIPSY (ESWL);              Surgeon: [name], MD;  Location: [facility] ORS;                Service: Urology;  Laterality: Right; No date: EYE MUSCLE SURGERY No date: TOTAL KNEE ARTHROPLASTY; Right  BMI    Body Mass Index:  25.09 kg/m      Reproductive/Obstetrics negative OB ROS                             Anesthesia Physical Anesthesia Plan  ASA: III  Anesthesia Plan: Spinal   Post-op Pain Management:    Induction:   PONV Risk Score and Plan: Propofol infusion  Airway Management Planned: Natural Airway and Nasal Cannula  Additional  Equipment:   Intra-op Plan:   Post-operative Plan:   Informed Consent: I have reviewed the patients History and Physical, chart, labs and discussed the procedure including the risks, benefits and alternatives for the proposed anesthesia with the patient or authorized representative who has indicated his/her understanding and acceptance.   Dental Advisory Given  Plan Discussed with: Anesthesiologist, CRNA and Surgeon  Anesthesia Plan Comments: (Patient reports no bleeding problems and no anticoagulant use.  Plan for spinal with backup GA  Patient consented for risks of anesthesia including but not limited to:  - adverse reactions to medications - risk of bleeding, infection, nerve damage and headache - risk of failed spinal - damage to teeth, lips or other oral mucosa - sore throat or hoarseness - Damage to heart, brain, lungs or loss of life  Patient voiced understanding.)        Anesthesia Quick Evaluation

## 2016-11-16 LAB — BASIC METABOLIC PANEL
Anion gap: 5 (ref 5–15)
BUN: 14 mg/dL (ref 6–20)
CHLORIDE: 107 mmol/L (ref 101–111)
CO2: 25 mmol/L (ref 22–32)
CREATININE: 0.84 mg/dL (ref 0.61–1.24)
Calcium: 8.5 mg/dL — ABNORMAL LOW (ref 8.9–10.3)
GFR calc non Af Amer: >60 mL/min (ref >60)
Glucose, Bld: 118 mg/dL — ABNORMAL HIGH (ref 65–99)
Potassium: 3.5 mmol/L (ref 3.5–5.1)
Sodium: 137 mmol/L (ref 135–145)

## 2016-11-16 LAB — CBC
HCT: 34.3 % — ABNORMAL LOW (ref 40.0–52.0)
HEMOGLOBIN: 11.9 g/dL — AB (ref 13.0–18.0)
MCH: 33.1 pg (ref 26.0–34.0)
MCHC: 34.6 g/dL (ref 32.0–36.0)
MCV: 95.7 fL (ref 80.0–100.0)
Platelets: 260 10*3/uL (ref 150–440)
RBC: 3.59 MIL/uL — ABNORMAL LOW (ref 4.40–5.90)
RDW: 13.6 % (ref 11.5–14.5)
WBC: 8.9 10*3/uL (ref 3.8–10.6)

## 2016-11-16 MED ORDER — TRAMADOL HCL 50 MG PO TABS: 50.0000 mg | ORAL_TABLET | ORAL | 0 refills | Status: DC | PRN

## 2016-11-16 MED ORDER — OXYCODONE HCL 5 MG PO TABS: 5.0000 mg | ORAL_TABLET | ORAL | 0 refills | Status: DC | PRN

## 2016-11-16 MED ORDER — ENOXAPARIN SODIUM 30 MG/0.3ML ~~LOC~~ SOLN: 40.0000 mg | SUBCUTANEOUS | 0 refills | Status: DC

## 2016-11-16 NOTE — Care Management (Signed)
Lovenox cost $85.

## 2016-11-16 NOTE — Progress Notes (Signed)
Physical Therapy Treatment Patient Details Name: Leonard Stokes MRN: [id number] DOB: Oct 28, 1945 Today's Date: 11/16/2016    History of Present Illness Admitted for acute hospitalization status post L TKR (11/15/16), WBAT.    PT Comments    Patient with increased soreness to surgical extremity this date, but continues with good efforts and participation with all therex/theract.  Completing transfers with less physical assist (though with limited active use of L LE) and increasing gait distance as tolerated.  Limited heel strike, toe off and L TKE throughout gait cycle; will continue to address in subsequent sessions. Plan to complete gait around nursing station this PM.    Follow Up Recommendations  Outpatient PT     Equipment Recommendations  Rolling walker with 5" wheels    Recommendations for Other Services       Precautions / Restrictions Precautions Precautions: Fall Restrictions Weight Bearing Restrictions: Yes LLE Weight Bearing: Weight bearing as tolerated    Mobility  Bed Mobility Overal bed mobility: Modified Independent             General bed mobility comments: seated in recliner beginning/end of treatment session  Transfers Overall transfer level: Needs assistance Equipment used: Rolling walker (2 wheeled) Transfers: Sit to/from Stand Sit to Stand: Min guard         General transfer comment: cuing for hand placement; maintains L LE anterior to BOS (limited active use with movement transition)  Ambulation/Gait Ambulation/Gait assistance: Min guard Ambulation Distance (Feet): 150 Feet Assistive device: Rolling walker (2 wheeled)       General Gait Details: step to gait with decreased step height/length, decreased stance time, decreased TKE L LE; encouraged for continuous stepping pattern (progressing to step through as tolerated) with increase in cadence as able   Stairs            Wheelchair Mobility    Modified Rankin (Stroke  Patients Only)       Balance Overall balance assessment: Needs assistance Sitting-balance support: No upper extremity supported;Feet supported Sitting balance-Leahy Scale: Good     Standing balance support: Bilateral upper extremity supported Standing balance-Leahy Scale: Fair                              Cognition Arousal/Alertness: Awake/alert Behavior During Therapy: WFL for tasks assessed/performed Overall Cognitive Status: Within Functional Limits for tasks assessed                                        Exercises Total Joint Exercises Goniometric ROM: L knee: 5-95 degrees, act assist (encouraged for L knee straight, no ER when elevated in recliner) Other Exercises Other Exercises: Seated LE therex, 1x12, AROM: ankle pumps, LAQs, knee flexion activities, marching    General Comments        Pertinent Vitals/Pain Pain Assessment: 0-10 Pain Score: 5  Pain Location: L knee Pain Descriptors / Indicators: Aching;Operative site guarding Pain Intervention(s): Limited activity within patient's tolerance;Monitored during session;Repositioned    Home Living Family/patient expects to be discharged to:: Private residence Living Arrangements: Spouse/significant other Available Help at Discharge: Family;Available 24 hours/day Type of Home: House Home Access: Stairs to enter Entrance Stairs-Rails: Right Home Layout: Two level;Able to live on main level with bedroom/bathroom Home Equipment: Walker - standard;Cane - single point;Adaptive equipment      Prior Function Level of Independence: Independent with  assistive device(s)      Comments: Indep without assist device for ADLs, household and community mobility; intermittent use of SPC when knee bothering him.  Denies fall history.  Stays active, enjoys volunteering at [employer].   PT Goals (current goals can now be found in the care plan section) Acute Rehab PT Goals Patient Stated Goal:  to keep going and stay active PT Goal Formulation: With patient/family Time For Goal Achievement: 11/29/16 Potential to Achieve Goals: Good Progress towards PT goals: Progressing toward goals    Frequency    BID      PT Plan Current plan remains appropriate    Co-evaluation              AM-PAC PT "6 Clicks" Daily Activity  Outcome Measure  Difficulty turning over in bed (including adjusting bedclothes, sheets and blankets)?: A Little Difficulty moving from lying on back to sitting on the side of the bed? : A Little Difficulty sitting down on and standing up from a chair with arms (e.g., wheelchair, bedside commode, etc,.)?: Unable Help needed moving to and from a bed to chair (including a wheelchair)?: A Little Help needed walking in hospital room?: A Little Help needed climbing 3-5 steps with a railing? : A Lot 6 Click Score: 15    End of Session Equipment Utilized During Treatment: Gait belt Activity Tolerance: Patient tolerated treatment well Patient left: in chair;with call bell/phone within reach;with family/visitor present;with chair alarm set Nurse Communication: Mobility status PT Visit Diagnosis: Muscle weakness (generalized) (M62.81);Difficulty in walking, not elsewhere classified (R26.2);Pain Pain - Right/Left: Left Pain - part of body: Knee     Time: 1122-1202 PT Time Calculation (min) (ACUTE ONLY): 40 min  Charges:  $Gait Training: 8-22 mins $Therapeutic Exercise: 23-37 mins                    G Codes:       Lareen Mullings H. Owens Shark, PT, DPT, NCS 11/16/16, 2:07 PM [phone]

## 2016-11-16 NOTE — Evaluation (Signed)
Occupational Therapy Evaluation Patient Details Name: Leonard Stokes MRN: [id number] DOB: Jan 16, 1946 Today's Date: 11/16/2016    History of Present Illness Admitted for acute hospitalization status post L TKR (11/15/16), WBAT.   Clinical Impression   Pt is 71 year old male s/p L TKR and previous R TKR.  Pt was independent in all ADLs prior to surgery and is eager to return to PLOF, occasionally using a SPC when knee pain was bad.  Pt currently requires minimal assist for LB dressing while in seated position due to pain and limited AROM of L knee. Pt/spouse educated in compression stocking mgt strategies, polar care mgt, and LB ADL tasks with AE and falls prevention with home/routine modifications. Pt/spouse verbalized understanding of all education/training provided via verbal instruction and visual demonstration. Spouse able to provide needed level of assist at home and to participate in PT as arranged by the surgeon. All OT education/training completed. Pt/spouse feel confident in return home with spouse able to provide needed level of assist. Will sign off. If additional needs arise, please re-consult.      Follow Up Recommendations  No OT follow up    Equipment Recommendations  3 in 1 bedside commode;Other (comment) (HH shower head)    Recommendations for Other Services       Precautions / Restrictions        Mobility Bed Mobility Overal bed mobility: Modified Independent                Transfers Overall transfer level: Needs assistance Equipment used: Rolling walker (2 wheeled) Transfers: Sit to/from Stand Sit to Stand: Min guard         General transfer comment: cuing for hand placement; maintains L LE anterior to BOS    Balance Overall balance assessment: Needs assistance Sitting-balance support: No upper extremity supported;Feet supported Sitting balance-Leahy Scale: Good     Standing balance support: Bilateral upper extremity supported Standing  balance-Leahy Scale: Fair                             ADL either performed or assessed with clinical judgement   ADL Overall ADL's : Needs assistance/impaired Eating/Feeding: Sitting;Set up   Grooming: Sitting;Set up   Upper Body Bathing: Sitting;Set up   Lower Body Bathing: Sitting/lateral leans;Sit to/from stand;Set up;Min guard   Upper Body Dressing : Sitting;Set up   Lower Body Dressing: Sitting/lateral leans;Sit to/from stand;Minimal assistance Lower Body Dressing Details (indicate cue type and reason): educated pt/spouse in use of reacher for LB dressing with verbal instruction and visual demo, pt/spouse verbalized understanding. Toilet Transfer: RW;Comfort height toilet;Ambulation;Min guard [employer] forces technical officer Details (indicate cue type and reason): educated on use of 3:1 at home for over the commode to maximize comfort, safety with toilet transfers         Functional mobility during ADLs: Rolling walker;Min guard General ADL Comments: generally min assist for LB ADL, pt/spouse educated in compression stocking mgt strategies, polar care mgt, and LB ADL tasks with AE and falls prevention with home/routine modifications. Pt/spouse verbalized understanding. Spouse able to provide needed level of assist at home.     Vision Baseline Vision/History: Wears glasses Wears Glasses: At all times Patient Visual Report: No change from baseline Vision Assessment?: No apparent visual deficits     Perception     Praxis      Pertinent Vitals/Pain Pain Assessment: 0-10 Pain Score: 6  Pain Location: L knee Pain Descriptors / Indicators:  Aching;Operative site guarding Pain Intervention(s): Limited activity within patient's tolerance;Monitored during session;Ice applied;Repositioned     Hand Dominance Right   Extremity/Trunk Assessment Upper Extremity Assessment Upper Extremity Assessment: Overall WFL for tasks assessed   Lower Extremity Assessment Lower Extremity  Assessment: Defer to PT evaluation (BLE grossly 4+/5, expected L knee ROM/strength impairments)   Cervical / Trunk Assessment Cervical / Trunk Assessment: Normal   Communication Communication Communication: No difficulties   Cognition Arousal/Alertness: Awake/alert Behavior During Therapy: WFL for tasks assessed/performed Overall Cognitive Status: Within Functional Limits for tasks assessed                                     General Comments       Exercises     Shoulder Instructions      Home Living Family/patient expects to be discharged to:: Private residence Living Arrangements: Spouse/significant other Available Help at Discharge: Family;Available 24 hours/day Type of Home: House Home Access: Stairs to enter [employer] of Steps: 2 Entrance Stairs-Rails: Right Home Layout: Two level;Able to live on main level with bedroom/bathroom     Bathroom Shower/Tub: Walk-in shower   Bathroom Toilet: Handicapped height     Home Equipment: Walker - standard;Cane - single point;Adaptive equipment Adaptive Equipment: Reacher        Prior Functioning/Environment Level of Independence: Independent with assistive device(s)        Comments: Indep without assist device for ADLs, household and community mobility; intermittent use of SPC when knee bothering him.  Denies fall history.  Stays active, enjoys volunteering at [employer].        OT Problem List:        OT Treatment/Interventions:      OT Goals(Current goals can be found in the care plan section) Acute Rehab OT Goals Patient Stated Goal: to keep going and stay active OT Goal Formulation: All assessment and education complete, DC therapy  OT Frequency:     Barriers to D/C:            Co-evaluation              AM-PAC PT "6 Clicks" Daily Activity     Outcome Measure Help from another person eating meals?: None Help from another person taking care of personal  grooming?: None Help from another person toileting, which includes using toliet, bedpan, or urinal?: A Little Help from another person bathing (including washing, rinsing, drying)?: A Little Help from another person to put on and taking off regular upper body clothing?: None Help from another person to put on and taking off regular lower body clothing?: A Little 6 Click Score: 21   End of Session Equipment Utilized During Treatment: Gait belt;Rolling walker  Activity Tolerance: Patient tolerated treatment well Patient left: in chair;with call bell/phone within reach;with chair alarm set;with family/visitor present;with SCD's reapplied;Other (comment) (polar care in place)  OT Visit Diagnosis: Other abnormalities of gait and mobility (R26.89)                Time: 7062-3762 OT Time Calculation (min): 38 min Charges:  OT General Charges $OT Visit: 1 Visit OT Evaluation $OT Eval Low Complexity: 1 Low OT Treatments $Self Care/Home Management : 23-37 mins G-Codes: OT G-codes **NOT FOR INPATIENT CLASS** Functional Assessment Tool Used: AM-PAC 6 Clicks Daily Activity;Clinical judgement Functional Limitation: Self care Self Care Current Status (G3151): At least 1 percent but less than 20  percent impaired, limited or restricted Self Care Goal Status (W1093): At least 1 percent but less than 20 percent impaired, limited or restricted Self Care Discharge Status [phone]): At least 1 percent but less than 20 percent impaired, limited or restricted   Jeni Salles, MPH, MS, OTR/L ascom [phone] 11/16/16, 12:04 PM

## 2016-11-16 NOTE — Progress Notes (Signed)
Physical Therapy Treatment Patient Details Name: Leonard Stokes MRN: [id number] DOB: Mar 25, 1945 Today's Date: 11/16/2016    History of Present Illness Admitted for acute hospitalization status post L TKR (11/15/16), WBAT.    PT Comments    Able to complete gait around nursing station with SW, cga/close sup; min cuing for walker position and management.  Good integration of cuing/education from therapist; will continue to emphasize L knee flex/ext, and active use of L LE with all functional activities. Will need to issue handout with HEP and complete stairs prior to discharge.    Follow Up Recommendations  Outpatient PT     Equipment Recommendations  Rolling walker with 5" wheels    Recommendations for Other Services       Precautions / Restrictions Precautions Precautions: Fall Restrictions Weight Bearing Restrictions: Yes LLE Weight Bearing: Weight bearing as tolerated    Mobility  Bed Mobility               General bed mobility comments: seated in recliner beginning/end of treatment session  Transfers Overall transfer level: Needs assistance Equipment used: Rolling walker (2 wheeled) Transfers: Sit to/from Stand Sit to Stand: Supervision         General transfer comment: instructed in progression towards more symmetrical LE foot placement with movement transitions  Ambulation/Gait Ambulation/Gait assistance: Supervision Ambulation Distance (Feet): 220 Feet Assistive device: Rolling walker (2 wheeled)       General Gait Details: patient requesting to trial SW brought from home; min cuing for walker management/positioning, encouraged for flat placement vs rocking.  Slow, guarded gait performance with persistent forward trunk flexion, L knee flexion throughout gait cycle.   Stairs            Wheelchair Mobility    Modified Rankin (Stroke Patients Only)       Balance Overall balance assessment: Needs assistance Sitting-balance support: No  upper extremity supported;Feet supported Sitting balance-Leahy Scale: Good     Standing balance support: Bilateral upper extremity supported Standing balance-Leahy Scale: Fair                              Cognition Arousal/Alertness: Awake/alert Behavior During Therapy: WFL for tasks assessed/performed Overall Cognitive Status: Within Functional Limits for tasks assessed                                        Exercises Total Joint Exercises Goniometric ROM: L knee: 5-95 degrees, act assist (encouraged for L knee straight, no ER when elevated in recliner) Other Exercises Other Exercises: Sit/stand x5 with RW, cga/close sup-emphasis on progressive L knee flexion with repetitions and increased active use of L LE Other Exercises: Toilet transfer, ambulatory wiht SW, cga/close sup; sit/stand from [facility] with SW, cga/close sup; standing balance at sink for hand hygiene, close sup.  miin cuing for walker position/management to avoid stepping away from with activity.    General Comments        Pertinent Vitals/Pain Pain Assessment: No/denies pain Pain Score: 5  Pain Location: L knee Pain Descriptors / Indicators: Aching;Operative site guarding Pain Intervention(s): Limited activity within patient's tolerance;Monitored during session;Repositioned    Home Living Family/patient expects to be discharged to:: Private residence                    Prior Function  PT Goals (current goals can now be found in the care plan section) Acute Rehab PT Goals Patient Stated Goal: to keep going and stay active PT Goal Formulation: With patient/family Time For Goal Achievement: 11/29/16 Potential to Achieve Goals: Good Progress towards PT goals: Progressing toward goals    Frequency    BID      PT Plan Current plan remains appropriate    Co-evaluation              AM-PAC PT "6 Clicks" Daily Activity  Outcome Measure  Difficulty  turning over in bed (including adjusting bedclothes, sheets and blankets)?: None Difficulty moving from lying on back to sitting on the side of the bed? : None Difficulty sitting down on and standing up from a chair with arms (e.g., wheelchair, bedside commode, etc,.)?: A Little Help needed moving to and from a bed to chair (including a wheelchair)?: A Little Help needed walking in hospital room?: A Little Help needed climbing 3-5 steps with a railing? : A Lot 6 Click Score: 19    End of Session Equipment Utilized During Treatment: Gait belt Activity Tolerance: Patient tolerated treatment well Patient left: in chair;with call bell/phone within reach;with chair alarm set;with family/visitor present Nurse Communication: Mobility status PT Visit Diagnosis: Muscle weakness (generalized) (M62.81);Difficulty in walking, not elsewhere classified (R26.2);Pain Pain - Right/Left: Left Pain - part of body: Knee     Time: 1530-1601 PT Time Calculation (min) (ACUTE ONLY): 31 min  Charges:  $Gait Training: 8-22 mins $Therapeutic Exercise: 23-37 mins $Therapeutic Activity: 8-22 mins                    G Codes:       Jobanny Mavis H. Owens Shark, PT, DPT, NCS 11/16/16, 4:47 PM [phone]

## 2016-11-16 NOTE — Anesthesia Postprocedure Evaluation (Signed)
Anesthesia Post Note  Patient: Leonard Stokes  Procedure(s) Performed: COMPUTER ASSISTED TOTAL KNEE ARTHROPLASTY (Left Knee)  Patient location during evaluation: Other Anesthesia Type: Spinal Level of consciousness: oriented and awake and alert Pain management: pain level controlled Vital Signs Assessment: post-procedure vital signs reviewed and stable Respiratory status: spontaneous breathing, respiratory function stable and patient connected to nasal cannula oxygen Cardiovascular status: blood pressure returned to baseline and stable Postop Assessment: no headache, no backache and no apparent nausea or vomiting Anesthetic complications: no     Last Vitals:  Vitals:   11/15/16 1206 11/15/16 2011  BP: 137/84 (!) 150/94  Pulse: 63 85  Resp: 18 18  Temp: 36.4 C 37.3 C  SpO2: 99% 100%    Last Pain:  Vitals:   11/16/16 0550  TempSrc:   PainSc: 6                  Alison Stalling

## 2016-11-16 NOTE — Discharge Summary (Signed)
Physician Discharge Summary  Patient ID: Leonard Stokes MRN: [id number] DOB/AGE: [birth year] 72 y.o.  Admit date: 11/15/2016 Discharge date: 11/17/2016  Admission Diagnoses:  primary osteoarthritis left knee   Discharge Diagnoses: Patient Active Problem List   Diagnosis Date Noted  . 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 Personal history of tobacco use, presenting hazards to health 09/26/2014    Past Medical History:  Diagnosis Date  . Arthritis   . Atherosclerosis of aorta ([place]) 2018  . BPH (benign prostatic hyperplasia)   . Chronic kidney disease 08/2016   kidney stone  . Diverticulosis of sigmoid colon   . Leg cramps      Transfusion: none   Consultants (if any):   Discharged Condition: Improved  Hospital Course: KAHLE MCQUEEN is an 71 y.o. male who was admitted 11/15/2016 with a diagnosis of degenerative arthrosis left knee and went to the operating room on 11/15/2016 and underwent the above named procedures.    Surgeries:Procedure(s): COMPUTER ASSISTED TOTAL KNEE ARTHROPLASTY on 11/15/2016  PRE-OPERATIVE DIAGNOSIS: Degenerative arthrosis of the left knee, primary  POST-OPERATIVE DIAGNOSIS:  Same  PROCEDURE:  Left total knee arthroplasty using computer-assisted navigation  SURGEON:  Marciano Sequin. M.D.  ASSISTANT:  Vance Peper, PA (present and scrubbed throughout the case, critical for assistance with exposure, retraction, instrumentation, and closure)  ANESTHESIA: spinal  ESTIMATED BLOOD LOSS: 50 mL  FLUIDS REPLACED: 1000 mL of crystalloid  TOURNIQUET TIME: 86 minutes  DRAINS: 2 medium Hemovac drains  SOFT TISSUE RELEASES: Anterior cruciate ligament, posterior cruciate ligament, deep medial collateral ligament, patellofemoral ligament  IMPLANTS UTILIZED: DePuy PFC Sigma size 4 posterior stabilized femoral component (cemented), size 4 MBT tibial component (cemented), 38 mm 3 peg oval dome patella (cemented), and a 12.5 mm stabilized  rotating platform polyethylene insert.  INDICATIONS FOR SURGERY: JERAME HEDDING is a 71 y.o. year old male with a long history of progressive knee pain. X-rays demonstrated severe degenerative changes in tricompartmental fashion. The patient had not seen any significant improvement despite conservative nonsurgical intervention. After discussion of the risks and benefits of surgical intervention, the patient expressed understanding of the risks benefits and agree with plans for total knee arthroplasty.   The risks, benefits, and alternatives were discussed at length including but not limited to the risks of infection, bleeding, nerve injury, stiffness, blood clots, the need for revision surgery, cardiopulmonary complications, among others, and they were willing to proceed.  Patient tolerated the surgery well. No complications .Patient was taken to PACU where she was stabilized and then transferred to the orthopedic floor.  Patient started on Lovenox 30 q 12 hrs. Foot pumps applied bilaterally at 80 mm hgb. Heels elevated off bed with rolled towels. No evidence of DVT. Calves non tender. Negative Homan. Physical therapy started on day #1 for gait training and transfer with OT starting on  day #1 for ADL and assisted devices. Patient has done well with therapy. Ambulated greater than 200 feet upon being discharged. Able to ascend and descend 4 steps safely and independently   Patient's IV And Foley were discontinued on day #1 with Hemovac being discontinued on day #2. Dressing was changed on day 2 prior to patient being discharged   He was given perioperative antibiotics:  Anti-infectives    Start     Dose/Rate Route Frequency Ordered Stop   11/15/16 1300  ceFAZolin (ANCEF) IVPB 2g/100 mL premix  Status:  Discontinued     2 g 200 mL/hr over 30 Minutes Intravenous  Every 6 hours 11/15/16 1156 11/15/16 1235   11/15/16 1300  ceFAZolin (ANCEF) 2 g in dextrose 5 % 100 mL IVPB     2 g 240 mL/hr over 30  Minutes Intravenous Every 6 hours 11/15/16 1235 11/16/16 0619   11/15/16 0605  ceFAZolin (ANCEF) 2-4 GM/100ML-% IVPB    Comments:  Lyman Bishop   : cabinet override      11/15/16 0605 11/15/16 0736   11/15/16 0258  ceFAZolin (ANCEF) IVPB 2g/100 mL premix     2 g 200 mL/hr over 30 Minutes Intravenous On call to O.R. 11/15/16 5397 11/15/16 0748    .  He was fitted with AV 1 compression foot pump devices, instructed on heel pumps, early ambulation, and fitted with TED stockings bilaterally for DVT prophylaxis.  He benefited maximally from the hospital stay and there were no complications.    Recent vital signs:  Vitals:   11/15/16 1206 11/15/16 2011  BP: 137/84 (!) 150/94  Pulse: 63 85  Resp: 18 18  Temp: 97.6 F (36.4 C) 99.1 F (37.3 C)  SpO2: 99% 100%    Recent laboratory studies:  Lab Results  Component Value Date   HGB 11.9 (L) 11/16/2016   HGB 13.9 11/08/2016   HGB 13.6 08/10/2016   Lab Results  Component Value Date   WBC 8.9 11/16/2016   PLT 260 11/16/2016   Lab Results  Component Value Date   INR 0.96 11/08/2016   Lab Results  Component Value Date   NA 137 11/16/2016   K 3.5 11/16/2016   CL 107 11/16/2016   CO2 25 11/16/2016   BUN 14 11/16/2016   CREATININE 0.84 11/16/2016   GLUCOSE 118 (H) 11/16/2016    Discharge Medications:   Allergies as of 11/16/2016      Reactions   Codeine Rash      Medication List    TAKE these medications   acetaminophen 500 MG tablet Commonly known as:  TYLENOL Take 1,500 mg by mouth 3 (three) times daily.   amoxicillin 500 MG capsule Commonly known as:  AMOXIL Take 2,000 mg by mouth See admin instructions. Take 4 capsules (2000 mg) by mouth 1 hour prior to dental procedures   B COMPLEX-B12 PO Take 1 tablet by mouth daily.   CAYENNE-GARLIC PO Take 1 capsule by mouth daily.   OSTEO BI-FLEX TRIPLE STRENGTH PO Take 2 tablets by mouth daily.   COD LIVER OIL PO Take 2 capsules by mouth daily.   enoxaparin  30 MG/0.3ML injection Commonly known as:  LOVENOX Inject 0.4 mLs (40 mg total) into the skin daily.   losartan-hydrochlorothiazide 50-12.5 MG tablet Commonly known as:  HYZAAR Take 1 tablet by mouth daily.   MAGNESIUM PO Take 140 mg by mouth daily.   meloxicam 15 MG tablet Commonly known as:  MOBIC Take 15 mg by mouth daily.   OVER THE COUNTER MEDICATION Take 2 tablets by mouth daily. OTC Cramp Defense   oxyCODONE 5 MG immediate release tablet Commonly known as:  Oxy IR/ROXICODONE Take 1-2 tablets (5-10 mg total) by mouth every 4 (four) hours as needed for severe pain or breakthrough pain.   SENIOR MULTIVITAMIN PLUS PO Take 1 tablet by mouth daily.   sildenafil 20 MG tablet Commonly known as:  REVATIO Take 20 mg by mouth daily as needed (for ED).   tamsulosin 0.4 MG Caps capsule Commonly known as:  FLOMAX Take 0.4 mg by mouth daily.   traMADol 50 MG tablet Commonly known as:  ULTRAM Take 1-2 tablets (50-100 mg total) by mouth every 4 (four) hours as needed for moderate pain.   TURMERIC CURCUMIN PO Take 2,000 mg by mouth daily.   vitamin C 1000 MG tablet Take 1,000 mg by mouth daily.   Vitamin D3 5000 units Caps Take 5,000 Units by mouth daily.   zolpidem 10 MG tablet Commonly known as:  AMBIEN Take 5 mg by mouth at bedtime as needed for sleep            Durable Medical Equipment        Start     Ordered   11/15/16 1156  DME Walker rolling  Once    Question:  Patient needs a walker to treat with the following condition  Answer:  Total knee replacement status   11/15/16 1156   11/15/16 1156  DME Bedside commode  Once    Question:  Patient needs a bedside commode to treat with the following condition  Answer:  Total knee replacement status   11/15/16 1156      Diagnostic Studies: Dg Knee Left Port  Result Date: 11/15/2016 CLINICAL DATA:  Postoperative total left knee joint prosthesis placement. EXAM: PORTABLE LEFT KNEE - 1-2 VIEW COMPARISON:  No  recent studies in [facility] FINDINGS: The patient has undergone left total knee joint prosthesis placement. Radiographic positioning of the prosthetic components is good. The interface with the native bone appears normal. Surgical drain lines and skin staples are present. IMPRESSION: There is no postprocedure complication following left total knee joint prosthesis placement. Electronically Signed   By: Laymon  Martinique M.D.   On: 11/15/2016 11:05    Disposition: 01-Home or Self Care  Discharge Instructions    Diet - low sodium heart healthy    Complete by:  As directed    Increase activity slowly    Complete by:  As directed       Follow-up Information    Watt Climes, PA On 11/30/2016.   Specialty:  Physician Assistant Why:  at 1:45pm Contact information: [place] [address] [phone]        Dereck Leep, MD On 12/28/2016.   Specialty:  Orthopedic Surgery Why:  at 10:00am Contact information: [place] [address] [phone]            Signed: Watt Climes 11/16/2016, 7:37 AM

## 2016-11-16 NOTE — Progress Notes (Signed)
Clinical Social Worker (CSW) received SNF consult. PT is recommending outpatient PT. RN case manager aware of above. Please reconsult if future social work needs arise. CSW signing off.   Phinley Schall, LCSW [phone]  

## 2016-11-16 NOTE — Discharge Instructions (Signed)
Instructions after Total Knee Replacement   James P. Holley Bouche., M.D.     Dept. of [facility]  [address], [place]  [address]  Phone: [phone]   Fax: [phone]    DIET:  Drink plenty of non-alcoholic fluids.  Resume your normal diet. Include foods high in fiber.  ACTIVITY:   You may use crutches or a walker with weight-bearing as tolerated, unless instructed otherwise.  You may be weaned off of the walker or crutches by your Physical Therapist.   Do NOT place pillows under the knee. Anything placed under the knee could limit your ability to straighten the knee.    Continue doing gentle exercises. Exercising will reduce the pain and swelling, increase motion, and prevent muscle weakness.    Please continue to use the TED compression stockings for 6 weeks. You may remove the stockings at night, but should reapply them in the morning.  Do not drive or operate any equipment until instructed.  WOUND CARE:   Continue to use the PolarCare or ice packs periodically to reduce pain and swelling.  You may bathe or shower after the staples are removed at the first office visit following surgery.  MEDICATIONS:  You may resume your regular medications.  Please take the pain medication as prescribed on the medication.  Do not take pain medication on an empty stomach.  You have been given a prescription for a blood thinner (Lovenox or Coumadin). Please take the medication as instructed. (NOTE: After completing a 2 week course of Lovenox, take one Enteric-coated aspirin once a day. This along with elevation will help reduce the possibility of phlebitis in your operated leg.)  Do not drive or drink alcoholic beverages when taking pain medications.  CALL THE OFFICE FOR:  Temperature above 101 degrees  Excessive bleeding or drainage on the dressing.  Excessive swelling, coldness, or paleness of the toes.  Persistent  nausea and vomiting.  FOLLOW-UP:   You should have an appointment to return to the office in 10-14 days after surgery.  Arrangements have been made for continuation of Physical Therapy (either home therapy or outpatient therapy).     TOTAL KNEE REPLACEMENT POSTOPERATIVE DIRECTIONS  Knee Rehabilitation, Guidelines Following Surgery  Results after knee surgery are often greatly improved when you follow the exercise, range of motion and muscle strengthening exercises prescribed by your doctor. Safety measures are also important to protect the knee from further injury. Any time any of these exercises cause you to have increased pain or swelling in your knee joint, decrease the amount until you are comfortable again and slowly increase them. If you have problems or questions, call your caregiver or physical therapist for advice.   HOME CARE INSTRUCTIONS  Remove items at home which could result in a fall. This includes throw rugs or furniture in walking pathways.   Continue to use polar care unit on the knee for pain and swelling from surgery. You may notice swelling that will progress down to the foot and ankle.  This is normal after surgery.  Elevate the leg when you are not up walking on it.    Continue to use the breathing machine which will help keep your temperature down.  It is common for your temperature to cycle up and down following surgery, especially at night when you are not up moving around and exerting yourself.  The breathing machine keeps your lungs expanded and your temperature down.  Do not  place pillow under knee, focus on keeping the knee STRAIGHT while resting  DIET You may resume your previous home diet once your are discharged from the hospital.  DRESSING / WOUND CARE / SHOWERING You may start showering once staples have been removed. Change the surgical dressing as needed.  STAPLE REMOVAL: Staples are to be removed at two weeks post op and apply benzoin and 1/2 inch  steri strips  ACTIVITY Walk with your walker as instructed. Use walker as long as suggested by your caregivers. Avoid periods of inactivity such as sitting longer than an hour when not asleep. This helps prevent blood clots.  You may resume a sexual relationship in one month or when given the OK by your doctor.  You may return to work once you are cleared by your doctor.  Do not drive a car for 6 weeks or until released by you surgeon.  Do not drive while taking narcotics.  WEIGHT BEARING Weight bearing as tolerated with assist device (walker, cane, etc) as directed, use it as long as suggested by your surgeon or therapist, typically at least 4-6 weeks.  POSTOPERATIVE CONSTIPATION PROTOCOL Constipation - defined medically as fewer than three stools per week and severe constipation as less than one stool per week.  One of the most common issues patients have following surgery is constipation.  Even if you have a regular bowel pattern at home, your normal regimen is likely to be disrupted due to multiple reasons following surgery.  Combination of anesthesia, postoperative narcotics, change in appetite and fluid intake all can affect your bowels.  In order to avoid complications following surgery, here are some recommendations in order to help you during your recovery period.  Colace (docusate) - Pick up an over-the-counter form of Colace or another stool softener and take twice a day as long as you are requiring postoperative pain medications.  Take with a full glass of water daily.  If you experience loose stools or diarrhea, hold the colace until you stool forms back up.  If your symptoms do not get better within 1 week or if they get worse, check with your doctor.  Dulcolax (bisacodyl) - Pick up over-the-counter and take as directed by the product packaging as needed to assist with the movement of your bowels.  Take with a full glass of water.  Use this product as needed if not relieved by  Colace only.   MiraLax (polyethylene glycol) - Pick up over-the-counter to have on hand.  MiraLax is a solution that will increase the amount of water in your bowels to assist with bowel movements.  Take as directed and can mix with a glass of water, juice, soda, coffee, or tea.  Take if you go more than two days without a movement. Do not use MiraLax more than once per day. Call your doctor if you are still constipated or irregular after using this medication for 7 days in a row.  If you continue to have problems with postoperative constipation, please contact the office for further assistance and recommendations.  If you experience "the worst abdominal pain ever" or develop nausea or vomiting, please contact the office immediatly for further recommendations for treatment.  ITCHING  If you experience itching with your medications, try taking only a single pain pill, or even half a pain pill at a time.  You can also use Benadryl over the counter for itching or also to help with sleep.   TED HOSE STOCKINGS Wear the elastic stockings  on both legs for  6 weeks following surgery during the day but you may remove then at night for sleeping.  MEDICATIONS See your medication summary on the After Visit Summary that the nursing staff will review with you prior to discharge.  You may have some home medications which will be placed on hold until you complete the course of blood thinner medication.  It is important for you to complete the blood thinner medication as prescribed by your surgeon.  Continue your approved medications as instructed at time of discharge.  PRECAUTIONS If you experience chest pain or shortness of breath - call 911 immediately for transfer to the hospital emergency department.  If you develop a fever greater that 101 F, purulent drainage from wound, increased redness or drainage from wound, foul odor from the wound/dressing, or calf pain - CONTACT YOUR SURGEON.                                                    FOLLOW-UP APPOINTMENTS Make sure you keep all of your appointments after your operation with your surgeon and caregivers. You should call the office at the above phone number and make an appointment for approximately two weeks after the date of your surgery or on the date instructed by your surgeon outlined in the "After Visit Summary".   RANGE OF MOTION AND STRENGTHENING EXERCISES  Rehabilitation of the knee is important following a knee injury or an operation. After just a few days of immobilization, the muscles of the thigh which control the knee become weakened and shrink (atrophy). Knee exercises are designed to build up the tone and strength of the thigh muscles and to improve knee motion. Often times heat used for twenty to thirty minutes before working out will loosen up your tissues and help with improving the range of motion but do not use heat for the first two weeks following surgery. These exercises can be done on a training (exercise) mat, on the floor, on a table or on a bed. Use what ever works the best and is most comfortable for you Knee exercises include:  Leg Lifts - While your knee is still immobilized in a splint or cast, you can do straight leg raises. Lift the leg to 60 degrees, hold for 3 sec, and slowly lower the leg. Repeat 10-20 times 2-3 times daily. Perform this exercise against resistance later as your knee gets better.  Quad and Hamstring Sets - Tighten up the muscle on the front of the thigh (Quad) and hold for 5-10 sec. Repeat this 10-20 times hourly. Hamstring sets are done by pushing the foot backward against an object and holding for 5-10 sec. Repeat as with quad sets.   Leg Slides: Lying on your back, slowly slide your foot toward your buttocks, bending your knee up off the floor (only go as far as is comfortable). Then slowly slide your foot back down until your leg is flat on the floor again.  Angel Wings: Lying on your back spread your  legs to the side as far apart as you can without causing discomfort.  A rehabilitation program following serious knee injuries can speed recovery and prevent re-injury in the future due to weakened muscles. Contact your doctor or a physical therapist for more information on knee rehabilitation.   IF YOU ARE TRANSFERRED TO  A SKILLED REHAB FACILITY If the patient is transferred to a skilled rehab facility following release from the hospital, a list of the current medications will be sent to the facility for the patient to continue.  When discharged from the skilled rehab facility, please have the facility set up the patient's [place] prior to being released. Also, the skilled facility will be responsible for providing the patient with their medications at time of release from the facility to include their pain medication, the muscle relaxants, and their blood thinner medication. If the patient is still at the rehab facility at time of the two week follow up appointment, the skilled rehab facility will also need to assist the patient in arranging follow up appointment in our office and any transportation needs.  MAKE SURE YOU:  Understand these instructions.  Get help right away if you are not doing well or get worse.

## 2016-11-16 NOTE — Progress Notes (Signed)
Patient is A+O with no signs of distress.  Pain is controlled with current medications.  Up to chair this evening for 1 hour.

## 2016-11-16 NOTE — Progress Notes (Signed)
   Subjective: 1 Day Post-Op Procedure(s) (LRB): COMPUTER ASSISTED TOTAL KNEE ARTHROPLASTY (Left) Patient reports pain as moderate.   Patient is well, and has had no acute complaints or problems Pt did very well with therapy yesterday Plan is to go Home after hospital stay. no nausea and no vomiting Patient denies any chest pains or shortness of breath. Objective: Vital signs in last 24 hours: Temp:  [97.5 F (36.4 C)-99.1 F (37.3 C)] 99.1 F (37.3 C) (10/15 2011) Pulse Rate:  [63-85] 85 (10/15 2011) Resp:  [13-18] 18 (10/15 2011) BP: (101-150)/(77-94) 150/94 (10/15 2011) SpO2:  [96 %-100 %] 100 % (10/15 2011) Heels are non tender and elevated off the bed using rolled towels and bone foam Intake/Output from previous day: 10/15 0701 - 10/16 0700 In: 9735 [P.O.:1040; I.V.:2600; IV Piggyback:540] Out: 3299 [Urine:2975; Drains:310; Blood:50] Intake/Output this shift: No intake/output data recorded.   Recent Labs  11/16/16 0422  HGB 11.9*    Recent Labs  11/16/16 0422  WBC 8.9  RBC 3.59*  HCT 34.3*  PLT 260    Recent Labs  11/16/16 0422  NA 137  K 3.5  CL 107  CO2 25  BUN 14  CREATININE 0.84  GLUCOSE 118*  CALCIUM 8.5*   No results for input(s): LABPT, INR in the last 72 hours.  EXAM General - Patient is Alert, Appropriate and Oriented Extremity - Neurologically intact Neurovascular intact Sensation intact distally Intact pulses distally Dorsiflexion/Plantar flexion intact Compartment soft Dressing - dressing C/D/I Motor Function - intact, moving foot and toes well on exam.    Past Medical History:  Diagnosis Date  . Arthritis   . Atherosclerosis of aorta ([place]) 2018  . BPH (benign prostatic hyperplasia)   . Chronic kidney disease 08/2016   kidney stone  . Diverticulosis of sigmoid colon   . Leg cramps     Assessment/Plan: 1 Day Post-Op Procedure(s) (LRB): COMPUTER ASSISTED TOTAL KNEE ARTHROPLASTY (Left) Active Problems:   S/P total knee  arthroplasty  Estimated body mass index is 25.09 kg/m as calculated from the following:   Height as of this encounter: 6' (1.829 m).   Weight as of this encounter: 83.9 kg (185 lb). Advance diet Up with therapy D/C IV fluids Plan for discharge tomorrow Discharge home with home health  Labs: reviewed DVT Prophylaxis - Lovenox, Foot Pumps and TED hose Weight-Bearing as tolerated to leg leg D/C O2 and Pulse OX and try on Room Air Begin working on a bowel movement Labs in am   [employer]. [place] 11/16/2016, 7:30 AM

## 2016-11-16 NOTE — Care Management Note (Signed)
Case Management Note  Patient Details  Name: Leonard Stokes MRN: [id number] Date of Birth: 03/27/1945  Subjective/Objective:                   RNCM met with patient to follow up on home health list provided to patient. He plans to return to home with home health. He would like to use [facility].  He has been informed of Lovenox cost of $85 and agrees. He is now requesting a front-wheeled walker as he has a standard walker available at home.  Action/Plan: Rolling walker requested from [facility]. Home health referral to Tim with [facility].   Expected Discharge Date:                  Expected Discharge Plan:     In-House Referral:     Discharge planning Services  CM Consult  Post Acute Care Choice:  Home Health, Durable Medical Equipment Choice offered to:  Patient, Spouse  DME Arranged:  Walker rolling DME Agency:  [facility]  HH Arranged:  PT HH Agency:  [facility] (now [facility])  Status of Service:  In process, will continue to follow  If discussed at Long Length of Stay Meetings, dates discussed:    Additional Comments:  Angela Johnson, RN 11/16/2016, 1:25 PM  

## 2016-11-17 LAB — BASIC METABOLIC PANEL
Anion gap: 6 (ref 5–15)
BUN: 14 mg/dL (ref 6–20)
CHLORIDE: 98 mmol/L — AB (ref 101–111)
CO2: 27 mmol/L (ref 22–32)
Calcium: 8.6 mg/dL — ABNORMAL LOW (ref 8.9–10.3)
Creatinine, Ser: 0.79 mg/dL (ref 0.61–1.24)
GFR calc Af Amer: >60 mL/min (ref >60)
GFR calc non Af Amer: >60 mL/min (ref >60)
GLUCOSE: 129 mg/dL — AB (ref 65–99)
POTASSIUM: 3.7 mmol/L (ref 3.5–5.1)
SODIUM: 131 mmol/L — AB (ref 135–145)

## 2016-11-17 LAB — CBC
HCT: 33.7 % — ABNORMAL LOW (ref 40.0–52.0)
Hemoglobin: 11.9 g/dL — ABNORMAL LOW (ref 13.0–18.0)
MCH: 33.9 pg (ref 26.0–34.0)
MCHC: 35.2 g/dL (ref 32.0–36.0)
MCV: 96.4 fL (ref 80.0–100.0)
PLATELETS: 255 10*3/uL (ref 150–440)
RBC: 3.5 MIL/uL — AB (ref 4.40–5.90)
RDW: 13.7 % (ref 11.5–14.5)
WBC: 9.1 10*3/uL (ref 3.8–10.6)

## 2016-11-17 MED ORDER — LACTULOSE 10 GM/15ML PO SOLN
10.0000 g | Freq: Two times a day (BID) | ORAL | Status: DC | PRN
  Administered 2016-11-17: 10 g via ORAL
  Filled 2016-11-17: qty 30

## 2016-11-17 NOTE — Care Management (Signed)
Patient's wife and patient eager to discharge. They will pick bedside commode up at [facility] store. Case closed.

## 2016-11-17 NOTE — Progress Notes (Signed)
   Subjective: 2 Days Post-Op Procedure(s) (LRB): COMPUTER ASSISTED TOTAL KNEE ARTHROPLASTY (Left) Patient reports pain as mild.   Patient is well, and has had no acute complaints or problems Pt did great with therapy yesterday Dressed and sitting at side of bed this am  Plan is to go Home after hospital stay. no nausea and no vomiting Patient denies any chest pains or shortness of breath. Objective: Vital signs in last 24 hours: Temp:  [98 F (36.7 C)-99.6 F (37.6 C)] 98.5 F (36.9 C) (10/16 1935) Pulse Rate:  [78-94] 85 (10/16 1935) Resp:  [16-20] 18 (10/16 1935) BP: (114-153)/(71-94) 125/71 (10/16 1935) SpO2:  [94 %-100 %] 98 % (10/16 1935) well approximated incision Heels are non tender and elevated off the bed using rolled towels Intake/Output from previous day: 10/16 0701 - 10/17 0700 In: 960 [P.O.:960] Out: 180 [Drains:180] Intake/Output this shift: Total I/O In: 480 [P.O.:480] Out: 80 [Drains:80]   Recent Labs  11/16/16 0422 11/17/16 0426  HGB 11.9* 11.9*    Recent Labs  11/16/16 0422 11/17/16 0426  WBC 8.9 9.1  RBC 3.59* 3.50*  HCT 34.3* 33.7*  PLT 260 255    Recent Labs  11/16/16 0422 11/17/16 0426  NA 137 131*  K 3.5 3.7  CL 107 98*  CO2 25 27  BUN 14 14  CREATININE 0.84 0.79  GLUCOSE 118* 129*  CALCIUM 8.5* 8.6*   No results for input(s): LABPT, INR in the last 72 hours.  EXAM General - Patient is Alert, Appropriate and Oriented Extremity - Neurologically intact Neurovascular intact Sensation intact distally Intact pulses distally Dorsiflexion/Plantar flexion intact No cellulitis present Compartment soft Dressing - dressing C/D/I Motor Function - intact, moving foot and toes well on exam.    Past Medical History:  Diagnosis Date  . Arthritis   . Atherosclerosis of aorta ([place]) 2018  . BPH (benign prostatic hyperplasia)   . Chronic kidney disease 08/2016   kidney stone  . Diverticulosis of sigmoid colon   . Leg cramps      Assessment/Plan: 2 Days Post-Op Procedure(s) (LRB): COMPUTER ASSISTED TOTAL KNEE ARTHROPLASTY (Left) Active Problems:   S/P total knee arthroplasty  Estimated body mass index is 25.09 kg/m as calculated from the following:   Height as of this encounter: 6' (1.829 m).   Weight as of this encounter: 83.9 kg (185 lb). Up with therapy Discharge home with home health  Labs: reviewed DVT Prophylaxis - Lovenox, Foot Pumps and TED hose Weight-Bearing as tolerated to left leg hemovac discontinued Please wash operative leg and apply TED stocking to both legs Please change dressing prior to d/c and give the pt 2 extra honeycomb dressing to take home Pt needs to have a bowel movement and do steps prior to d/c  Zalea Pete R. Quesada [place] 11/17/2016, 6:58 AM

## 2016-11-17 NOTE — Care Management (Signed)
RNCM spoke with patient this AM and he states that he wants to use his standard walker but would like to to have a bedside commode. I have requested this from [place] with [facility]. I have notified Tim with [facility] of patient discharge to home too. No other RNCM needs.

## 2016-11-17 NOTE — Progress Notes (Signed)
Physical Therapy Treatment Patient Details Name: NOAH LEMBKE MRN: [id number] DOB: 03-13-1945 Today's Date: 11/17/2016    History of Present Illness Admitted for acute hospitalization status post L TKR (11/15/16), WBAT.    PT Comments    Pt able to stand and ambulate around unit with walker and supervision.  Stair training completed with one rail right.  Pt demonstrated and voiced confidence in mobility skills.  Pt given and returned demo of HEP packet.  Pt continues with step to gait but does well with reciprocal gait when cued.  Pt has a standard walker at home and plans to use it upon discharge.  Encouraged pt to consider a rolling walker to facilitate step through gait pattern which is limited with standard walker.  Pt stated he will consider and purchase one upon returning home.   Follow Up Recommendations  Outpatient PT     Equipment Recommendations  Rolling walker with 5" wheels    Recommendations for Other Services       Precautions / Restrictions Precautions Precautions: Fall Restrictions Weight Bearing Restrictions: Yes LLE Weight Bearing: Weight bearing as tolerated    Mobility  Bed Mobility               General bed mobility comments: seated in recliner beginning/end of treatment session  Transfers Overall transfer level: Needs assistance Equipment used: Rolling walker (2 wheeled) Transfers: Sit to/from Stand Sit to Stand: Supervision         General transfer comment: instructed in progression towards more symmetrical LE foot placement with movement transitions  Ambulation/Gait Ambulation/Gait assistance: Supervision Ambulation Distance (Feet): 220 Feet Assistive device: Rolling walker (2 wheeled) Gait Pattern/deviations: Step-to pattern;Step-through pattern         Stairs Stairs: Yes   Stair Management: One rail Right;Step to pattern      Wheelchair Mobility    Modified Rankin (Stroke Patients Only)       Balance Overall  balance assessment: Needs assistance Sitting-balance support: No upper extremity supported;Feet supported Sitting balance-Leahy Scale: Good     Standing balance support: Bilateral upper extremity supported Standing balance-Leahy Scale: Fair                              Cognition Arousal/Alertness: Awake/alert Behavior During Therapy: WFL for tasks assessed/performed Overall Cognitive Status: Within Functional Limits for tasks assessed                                        Exercises Total Joint Exercises Ankle Circles/Pumps: Both;AROM;10 reps Quad Sets: AROM;Both;10 reps Gluteal Sets: AROM;Both;10 reps Towel Squeeze: AROM;Both;10 reps Short Arc Quad: AROM;Left;10 reps;Supine Heel Slides: Supine;Left;10 reps;AAROM Hip ABduction/ADduction: AAROM;Supine;Left;10 reps Straight Leg Raises: AROM;Supine;Left;10 reps Long Arc Quad: AROM;Seated;Left;10 reps Knee Flexion: AAROM;Left;Seated;5 reps Goniometric ROM: 5-99    General Comments        Pertinent Vitals/Pain Pain Assessment: 0-10 Pain Score: 3  Pain Location: L knee Pain Descriptors / Indicators: Aching;Operative site guarding Pain Intervention(s): Limited activity within patient's tolerance;Premedicated before session    Home Living                      Prior Function            PT Goals (current goals can now be found in the care plan section) Progress towards PT goals: Progressing toward goals  Frequency    BID      PT Plan Current plan remains appropriate    Co-evaluation              AM-PAC PT "6 Clicks" Daily Activity  Outcome Measure  Difficulty turning over in bed (including adjusting bedclothes, sheets and blankets)?: None Difficulty moving from lying on back to sitting on the side of the bed? : None Difficulty sitting down on and standing up from a chair with arms (e.g., wheelchair, bedside commode, etc,.)?: A Little Help needed moving to and from  a bed to chair (including a wheelchair)?: A Little Help needed walking in hospital room?: A Little Help needed climbing 3-5 steps with a railing? : A Lot 6 Click Score: 19    End of Session Equipment Utilized During Treatment: Gait belt Activity Tolerance: Patient tolerated treatment well Patient left: in chair;with call bell/phone within reach;with chair alarm set;with family/visitor present   Pain - Right/Left: Left Pain - part of body: Knee     Time: 1829-9371 PT Time Calculation (min) (ACUTE ONLY): 39 min  Charges:  $Gait Training: 23-37 mins $Therapeutic Exercise: 8-22 mins                    G Codes:       Chesley Noon, PTA 11/17/16, 10:14 AM

## 2016-11-18 DIAGNOSIS — Z9181 History of falling: Secondary | ICD-10-CM | POA: Diagnosis not present

## 2016-11-18 DIAGNOSIS — Z87891 Personal history of nicotine dependence: Secondary | ICD-10-CM | POA: Diagnosis not present

## 2016-11-18 DIAGNOSIS — N189 Chronic kidney disease, unspecified: Secondary | ICD-10-CM | POA: Diagnosis not present

## 2016-11-18 DIAGNOSIS — I700 Atherosclerosis of aorta: Secondary | ICD-10-CM | POA: Diagnosis not present

## 2016-11-18 DIAGNOSIS — Z96652 Presence of left artificial knee joint: Secondary | ICD-10-CM | POA: Diagnosis not present

## 2016-11-18 DIAGNOSIS — Z471 Aftercare following joint replacement surgery: Secondary | ICD-10-CM | POA: Diagnosis not present

## 2016-11-18 DIAGNOSIS — N400 Enlarged prostate without lower urinary tract symptoms: Secondary | ICD-10-CM | POA: Diagnosis not present

## 2016-11-18 DIAGNOSIS — Z79891 Long term (current) use of opiate analgesic: Secondary | ICD-10-CM | POA: Diagnosis not present

## 2016-11-18 DIAGNOSIS — Z792 Long term (current) use of antibiotics: Secondary | ICD-10-CM | POA: Diagnosis not present

## 2016-11-22 DIAGNOSIS — Z9181 History of falling: Secondary | ICD-10-CM | POA: Diagnosis not present

## 2016-11-22 DIAGNOSIS — Z471 Aftercare following joint replacement surgery: Secondary | ICD-10-CM | POA: Diagnosis not present

## 2016-11-22 DIAGNOSIS — Z87891 Personal history of nicotine dependence: Secondary | ICD-10-CM | POA: Diagnosis not present

## 2016-11-22 DIAGNOSIS — Z792 Long term (current) use of antibiotics: Secondary | ICD-10-CM | POA: Diagnosis not present

## 2016-11-22 DIAGNOSIS — Z79891 Long term (current) use of opiate analgesic: Secondary | ICD-10-CM | POA: Diagnosis not present

## 2016-11-22 DIAGNOSIS — N400 Enlarged prostate without lower urinary tract symptoms: Secondary | ICD-10-CM | POA: Diagnosis not present

## 2016-11-22 DIAGNOSIS — Z96652 Presence of left artificial knee joint: Secondary | ICD-10-CM | POA: Diagnosis not present

## 2016-11-22 DIAGNOSIS — I700 Atherosclerosis of aorta: Secondary | ICD-10-CM | POA: Diagnosis not present

## 2016-11-22 DIAGNOSIS — N189 Chronic kidney disease, unspecified: Secondary | ICD-10-CM | POA: Diagnosis not present

## 2016-11-29 DIAGNOSIS — I700 Atherosclerosis of aorta: Secondary | ICD-10-CM | POA: Diagnosis not present

## 2016-11-29 DIAGNOSIS — Z792 Long term (current) use of antibiotics: Secondary | ICD-10-CM | POA: Diagnosis not present

## 2016-11-29 DIAGNOSIS — N189 Chronic kidney disease, unspecified: Secondary | ICD-10-CM | POA: Diagnosis not present

## 2016-11-29 DIAGNOSIS — Z96652 Presence of left artificial knee joint: Secondary | ICD-10-CM | POA: Diagnosis not present

## 2016-11-29 DIAGNOSIS — Z87891 Personal history of nicotine dependence: Secondary | ICD-10-CM | POA: Diagnosis not present

## 2016-11-29 DIAGNOSIS — Z9181 History of falling: Secondary | ICD-10-CM | POA: Diagnosis not present

## 2016-11-29 DIAGNOSIS — Z79891 Long term (current) use of opiate analgesic: Secondary | ICD-10-CM | POA: Diagnosis not present

## 2016-11-29 DIAGNOSIS — N400 Enlarged prostate without lower urinary tract symptoms: Secondary | ICD-10-CM | POA: Diagnosis not present

## 2016-11-29 DIAGNOSIS — Z471 Aftercare following joint replacement surgery: Secondary | ICD-10-CM | POA: Diagnosis not present

## 2016-11-30 DIAGNOSIS — N401 Enlarged prostate with lower urinary tract symptoms: Secondary | ICD-10-CM | POA: Diagnosis not present

## 2016-11-30 DIAGNOSIS — Z96652 Presence of left artificial knee joint: Secondary | ICD-10-CM | POA: Diagnosis not present

## 2016-11-30 DIAGNOSIS — M25562 Pain in left knee: Secondary | ICD-10-CM | POA: Diagnosis not present

## 2016-11-30 DIAGNOSIS — M6281 Muscle weakness (generalized): Secondary | ICD-10-CM | POA: Diagnosis not present

## 2016-11-30 DIAGNOSIS — F5101 Primary insomnia: Secondary | ICD-10-CM | POA: Diagnosis not present

## 2016-11-30 DIAGNOSIS — R351 Nocturia: Secondary | ICD-10-CM | POA: Diagnosis not present

## 2016-11-30 DIAGNOSIS — I10 Essential (primary) hypertension: Secondary | ICD-10-CM | POA: Diagnosis not present

## 2016-11-30 DIAGNOSIS — M25662 Stiffness of left knee, not elsewhere classified: Secondary | ICD-10-CM | POA: Diagnosis not present

## 2016-11-30 DIAGNOSIS — Z125 Encounter for screening for malignant neoplasm of prostate: Secondary | ICD-10-CM | POA: Diagnosis not present

## 2016-11-30 DIAGNOSIS — Z79899 Other long term (current) drug therapy: Secondary | ICD-10-CM | POA: Diagnosis not present

## 2016-12-03 DIAGNOSIS — Z96652 Presence of left artificial knee joint: Secondary | ICD-10-CM | POA: Diagnosis not present

## 2016-12-07 DIAGNOSIS — M25562 Pain in left knee: Secondary | ICD-10-CM | POA: Diagnosis not present

## 2016-12-07 DIAGNOSIS — Z96652 Presence of left artificial knee joint: Secondary | ICD-10-CM | POA: Diagnosis not present

## 2016-12-09 DIAGNOSIS — M25562 Pain in left knee: Secondary | ICD-10-CM | POA: Diagnosis not present

## 2016-12-09 DIAGNOSIS — Z96652 Presence of left artificial knee joint: Secondary | ICD-10-CM | POA: Diagnosis not present

## 2016-12-13 DIAGNOSIS — Z96652 Presence of left artificial knee joint: Secondary | ICD-10-CM | POA: Diagnosis not present

## 2016-12-13 DIAGNOSIS — M25562 Pain in left knee: Secondary | ICD-10-CM | POA: Diagnosis not present

## 2016-12-15 DIAGNOSIS — H2513 Age-related nuclear cataract, bilateral: Secondary | ICD-10-CM | POA: Diagnosis not present

## 2016-12-15 DIAGNOSIS — Z96652 Presence of left artificial knee joint: Secondary | ICD-10-CM | POA: Diagnosis not present

## 2016-12-17 DIAGNOSIS — Z96652 Presence of left artificial knee joint: Secondary | ICD-10-CM | POA: Diagnosis not present

## 2016-12-21 DIAGNOSIS — Z96652 Presence of left artificial knee joint: Secondary | ICD-10-CM | POA: Diagnosis not present

## 2016-12-21 DIAGNOSIS — M25562 Pain in left knee: Secondary | ICD-10-CM | POA: Diagnosis not present

## 2016-12-27 DIAGNOSIS — Z96652 Presence of left artificial knee joint: Secondary | ICD-10-CM | POA: Diagnosis not present

## 2016-12-28 DIAGNOSIS — Z96652 Presence of left artificial knee joint: Secondary | ICD-10-CM | POA: Diagnosis not present

## 2016-12-28 DIAGNOSIS — Z471 Aftercare following joint replacement surgery: Secondary | ICD-10-CM | POA: Diagnosis not present

## 2016-12-29 DIAGNOSIS — Z96652 Presence of left artificial knee joint: Secondary | ICD-10-CM | POA: Diagnosis not present

## 2016-12-31 DIAGNOSIS — Z96652 Presence of left artificial knee joint: Secondary | ICD-10-CM | POA: Diagnosis not present

## 2017-02-01 DIAGNOSIS — C801 Malignant (primary) neoplasm, unspecified: Secondary | ICD-10-CM

## 2017-02-01 HISTORY — DX: Malignant (primary) neoplasm, unspecified: C80.1

## 2017-03-10 DIAGNOSIS — I10 Essential (primary) hypertension: Secondary | ICD-10-CM | POA: Diagnosis not present

## 2017-03-10 DIAGNOSIS — Z79899 Other long term (current) drug therapy: Secondary | ICD-10-CM | POA: Diagnosis not present

## 2017-03-10 DIAGNOSIS — Z125 Encounter for screening for malignant neoplasm of prostate: Secondary | ICD-10-CM | POA: Diagnosis not present

## 2017-03-23 ENCOUNTER — Ambulatory Visit (INDEPENDENT_AMBULATORY_CARE_PROVIDER_SITE_OTHER): Payer: PPO | Admitting: Urology

## 2017-03-23 ENCOUNTER — Other Ambulatory Visit: Payer: Self-pay | Admitting: Radiology

## 2017-03-23 ENCOUNTER — Encounter: Payer: Self-pay | Admitting: Urology

## 2017-03-23 ENCOUNTER — Ambulatory Visit
Admission: RE | Admit: 2017-03-23 | Discharge: 2017-03-23 | Disposition: A | Discharge status: Home / Self Care | Payer: PPO | Source: Ambulatory Visit | Attending: Urology | Admitting: Urology

## 2017-03-23 ENCOUNTER — Encounter: Payer: Self-pay | Admitting: *Deleted

## 2017-03-23 ENCOUNTER — Other Ambulatory Visit: Payer: Self-pay | Admitting: Urology

## 2017-03-23 VITALS — BP 130/88 | HR 74 | Ht 72.0 in | Wt 180.0 lb

## 2017-03-23 DIAGNOSIS — N200 Calculus of kidney: Secondary | ICD-10-CM

## 2017-03-23 DIAGNOSIS — K4090 Unilateral inguinal hernia, without obstruction or gangrene, not specified as recurrent: Secondary | ICD-10-CM | POA: Diagnosis not present

## 2017-03-23 MED ORDER — CIPROFLOXACIN HCL 500 MG PO TABS
500.0000 mg | ORAL_TABLET | ORAL | Status: AC
  Administered 2017-03-24: 500 mg via ORAL

## 2017-03-23 NOTE — H&P (View-Only) (Signed)
03/23/2017 6:52 PM   Leonard Stokes Apr 28, 1945 [id number]  Referring provider: Derinda Late, MD [phone] [facility] [place], [place] [address]  Chief Complaint  Patient presents with  . Nephrolithiasis    36month w/KUB    HPI: 72 year old male with a personal history of nephrolithiasis who presents today for six-month follow-up with KUB  KUB today shows slight enlargement of a previously left upper pole stone.  This remains asymptomatic.  No flank pain or gross hematuria.  He has been trying to drink plenty water.  He previously underwent successful shockwave lithotripsy for treatment of a 1 cm right proximal ureteral calculus ~ 6 months ago.    He denies any flank pain, fevers, chills, urinary symptoms, gross hematuria, dysuria.  He does also complain today of a symptomatic right inguinal hernia.  It is worse with movement and lifting.  Over the past several months, has become larger and may be interested in surgical correction for this.  Denies any abdominal pain, nausea or vomiting.   PMH: Past Medical History:  Diagnosis Date  . Arthritis   . Atherosclerosis of aorta ([place]) 2018  . BPH (benign prostatic hyperplasia)   . Chronic kidney disease 08/2016   kidney stone  . Diverticulosis of sigmoid colon   . Leg cramps     Surgical History: Past Surgical History:  Procedure Laterality Date  . APPENDECTOMY    . EXTRACORPOREAL SHOCK WAVE LITHOTRIPSY Right 08/19/2016   Procedure: EXTRACORPOREAL SHOCK WAVE LITHOTRIPSY (ESWL);  Surgeon: [name], MD;  Location: [facility] ORS;  Service: Urology;  Laterality: Right;  . EYE MUSCLE SURGERY    . KNEE ARTHROPLASTY Left 11/15/2016   Procedure: COMPUTER ASSISTED TOTAL KNEE ARTHROPLASTY;  Surgeon: Dereck Leep, MD;  Location: [facility] ORS;  Service: Orthopedics;  Laterality: Left;  . TOTAL KNEE ARTHROPLASTY Right     Home Medications:  Allergies as of 03/23/2017      Reactions   Codeine Rash      Medication List        Accurate as of 03/23/17  6:52 PM. Always use your most recent med list.          acetaminophen 500 MG tablet Commonly known as:  TYLENOL Take 1,500 mg by mouth 3 (three) times daily.   B COMPLEX-B12 PO Take 1 tablet by mouth daily.   CAYENNE-GARLIC PO Take 1 capsule by mouth daily.   OSTEO BI-FLEX TRIPLE STRENGTH PO Take 2 tablets by mouth daily.   COD LIVER OIL PO Take 2 capsules by mouth daily.   losartan-hydrochlorothiazide 50-12.5 MG tablet Commonly known as:  HYZAAR Take 1 tablet by mouth daily.   MAGNESIUM PO Take 140 mg by mouth daily.   meloxicam 15 MG tablet Commonly known as:  MOBIC Take 15 mg by mouth daily.   OVER THE COUNTER MEDICATION Take 2 tablets by mouth daily. OTC Cramp Defense   SENIOR MULTIVITAMIN PLUS PO Take 1 tablet by mouth daily.   sildenafil 20 MG tablet Commonly known as:  REVATIO Take 20 mg by mouth daily as needed (for ED).   tamsulosin 0.4 MG Caps capsule Commonly known as:  FLOMAX Take 0.4 mg by mouth daily.   TURMERIC CURCUMIN PO Take 2,000 mg by mouth daily.   vitamin C 1000 MG tablet Take 1,000 mg by mouth daily.   Vitamin D3 5000 units Caps Take 5,000 Units by mouth daily.   zolpidem 10 MG tablet Commonly known as:  AMBIEN Take 5 mg by mouth at bedtime as needed for sleep       Allergies:  Allergies  Allergen Reactions  . Codeine Rash    Family History: No family history on file.  Social History:  reports that he quit smoking about 20 years ago. he has never used smokeless tobacco. He reports that he drinks alcohol. He reports that he does not use drugs.  ROS: UROLOGY Frequent Urination?: No Hard to postpone urination?: No Burning/pain with urination?: No Get up at night to urinate?: No Leakage of urine?: No Urine stream starts and stops?: No Trouble starting stream?: No Do you have to strain to urinate?: No Blood in urine?: No Urinary tract infection?:  No Sexually transmitted disease?: No Injury to kidneys or bladder?: No Painful intercourse?: No Weak stream?: No Erection problems?: No Penile pain?: No  Gastrointestinal Nausea?: No Vomiting?: No Indigestion/heartburn?: No Diarrhea?: No Constipation?: No  Constitutional Fever: No Night sweats?: No Weight loss?: No Fatigue?: No  Skin Skin rash/lesions?: No Itching?: No  Eyes Blurred vision?: No Double vision?: No  Ears/Nose/Throat Sore throat?: No Sinus problems?: No  Hematologic/Lymphatic Swollen glands?: No Easy bruising?: No  Cardiovascular Leg swelling?: No Chest pain?: No  Respiratory Cough?: No Shortness of breath?: No  Endocrine Excessive thirst?: No  Musculoskeletal Back pain?: No Joint pain?: No  Neurological Headaches?: No Dizziness?: No  Psychologic Depression?: No Anxiety?: No  Physical Exam: BP 130/88   Pulse 74   Ht 6' (1.829 m)   Wt 180 lb (81.6 kg)   BMI 24.41 kg/m   Constitutional:  Alert and oriented, No acute distress. HEENT: [place] AT, moist mucus membranes.  Trachea midline, no masses. Cardiovascular: No clubbing, cyanosis, or edema. Respiratory: Normal respiratory effort, no increased work of breathing. GI: Abdomen is soft, nontender.  In absence of Valsalva, a moderate sized right inguinal hernias palpable when examined in the supine position.  It is easily reducible. GU: No CVA tenderness. Skin: No rashes, bruises or suspicious lesions. Neurologic: Grossly intact, no focal deficits, moving all 4 extremities. Psychiatric: Normal mood and affect.  Laboratory Data: Lab Results  Component Value Date   WBC 9.1 11/17/2016   HGB 11.9 (L) 11/17/2016   HCT 33.7 (L) 11/17/2016   MCV 96.4 11/17/2016   PLT 255 11/17/2016    Lab Results  Component Value Date   CREATININE 0.79 11/17/2016    Urinalysis N/a  Pertinent Imaging: CLINICAL DATA:  Nephrolithiasis.  EXAM: ABDOMEN - 1 VIEW  COMPARISON:  Radiograph of  September 03, 2016.  FINDINGS: The bowel gas pattern is normal. Stable left renal calculus is noted. Right ureteral calculi noted on prior exam are no longer visualized. Stable phleboliths are noted in the pelvis.  IMPRESSION: Stable left renal calculus. Right ureteral calculi noted on prior exam no longer present.   Electronically Signed   By: Marijo Conception, M.D.   On: 09/17/2016 13:18   KUB personally reviewed and compared to previous X ray.  The stone appears to be ever so slightly larger, now with a commet type tail rather than spherical as previous.  This was also reviewed with the patient personally.  Assessment & Plan:    1. Kidney stone on left side 6+ mm LUP asymptomatic renal calculus, appears to be possibly enlarging ever so slightly Options including continued observation, ureteroscopy, shockwave lithotripsy reviewed again in detail. He is previously done well with shockwave lithotripsy and is interested in treating the stone prophylactically.  Risk and benefits  of shockwave lithotripsy were reviewed again today in detail including risk of bleeding, infection, hematoma, damage to surrounding structures, failure of the procedure, Steinstrasse, need for further intervention, splenic injury all were discussed.  He understands the risk.  He would like to proceed with this tomorrow.  Consent and paperwork signed.  2. Right inguinal hernia Enlarging symptomatic right inguinal hernia - Ambulatory referral to [place], MD  [place] [address], [place], [place] [address] [phone]

## 2017-03-23 NOTE — Progress Notes (Signed)
03/23/2017 6:52 PM   Leonard Stokes 12-17-1945 [id number]  Referring provider: Derinda Late, MD [phone] [address] [place], [place] [address]  Chief Complaint  Patient presents with  . Nephrolithiasis    81month w/KUB    HPI: 72 year old male with a personal history of nephrolithiasis who presents today for six-month follow-up with KUB  KUB today shows slight enlargement of a previously left upper pole stone.  This remains asymptomatic.  No flank pain or gross hematuria.  He has been trying to drink plenty water.  He previously underwent successful shockwave lithotripsy for treatment of a 1 cm right proximal ureteral calculus ~ 6 months ago.    He denies any flank pain, fevers, chills, urinary symptoms, gross hematuria, dysuria.  He does also complain today of a symptomatic right inguinal hernia.  It is worse with movement and lifting.  Over the past several months, has become larger and may be interested in surgical correction for this.  Denies any abdominal pain, nausea or vomiting.   PMH: Past Medical History:  Diagnosis Date  . Arthritis   . Atherosclerosis of aorta ([place]) 2018  . BPH (benign prostatic hyperplasia)   . Chronic kidney disease 08/2016   kidney stone  . Diverticulosis of sigmoid colon   . Leg cramps     Surgical History: Past Surgical History:  Procedure Laterality Date  . APPENDECTOMY    . EXTRACORPOREAL SHOCK WAVE LITHOTRIPSY Right 08/19/2016   Procedure: EXTRACORPOREAL SHOCK WAVE LITHOTRIPSY (ESWL);  Surgeon: [name], MD;  Location: [facility]S;  Service: Urology;  Laterality: Right;  . EYE MUSCLE SURGERY    . KNEE ARTHROPLASTY Left 11/15/2016   Procedure: COMPUTER ASSISTED TOTAL KNEE ARTHROPLASTY;  Surgeon: Dereck Leep, MD;  Location: [facility] ORS;  Service: Orthopedics;  Laterality: Left;  . TOTAL KNEE ARTHROPLASTY Right     Home Medications:  Allergies as of 03/23/2017      Reactions   Codeine Rash      Medication List        Accurate as of 03/23/17  6:52 PM. Always use your most recent med list.          acetaminophen 500 MG tablet Commonly known as:  TYLENOL Take 1,500 mg by mouth 3 (three) times daily.   B COMPLEX-B12 PO Take 1 tablet by mouth daily.   CAYENNE-GARLIC PO Take 1 capsule by mouth daily.   OSTEO BI-FLEX TRIPLE STRENGTH PO Take 2 tablets by mouth daily.   COD LIVER OIL PO Take 2 capsules by mouth daily.   losartan-hydrochlorothiazide 50-12.5 MG tablet Commonly known as:  HYZAAR Take 1 tablet by mouth daily.   MAGNESIUM PO Take 140 mg by mouth daily.   meloxicam 15 MG tablet Commonly known as:  MOBIC Take 15 mg by mouth daily.   OVER THE COUNTER MEDICATION Take 2 tablets by mouth daily. OTC Cramp Defense   SENIOR MULTIVITAMIN PLUS PO Take 1 tablet by mouth daily.   sildenafil 20 MG tablet Commonly known as:  REVATIO Take 20 mg by mouth daily as needed (for ED).   tamsulosin 0.4 MG Caps capsule Commonly known as:  FLOMAX Take 0.4 mg by mouth daily.   TURMERIC CURCUMIN PO Take 2,000 mg by mouth daily.   vitamin C 1000 MG tablet Take 1,000 mg by mouth daily.   Vitamin D3 5000 units Caps Take 5,000 Units by mouth daily.   zolpidem 10 MG tablet Commonly known as:  AMBIEN Take 5 mg by mouth at bedtime as needed for sleep       Allergies:  Allergies  Allergen Reactions  . Codeine Rash    Family History: No family history on file.  Social History:  reports that he quit smoking about 20 years ago. he has never used smokeless tobacco. He reports that he drinks alcohol. He reports that he does not use drugs.  ROS: UROLOGY Frequent Urination?: No Hard to postpone urination?: No Burning/pain with urination?: No Get up at night to urinate?: No Leakage of urine?: No Urine stream starts and stops?: No Trouble starting stream?: No Do you have to strain to urinate?: No Blood in urine?: No Urinary tract infection?:  No Sexually transmitted disease?: No Injury to kidneys or bladder?: No Painful intercourse?: No Weak stream?: No Erection problems?: No Penile pain?: No  Gastrointestinal Nausea?: No Vomiting?: No Indigestion/heartburn?: No Diarrhea?: No Constipation?: No  Constitutional Fever: No Night sweats?: No Weight loss?: No Fatigue?: No  Skin Skin rash/lesions?: No Itching?: No  Eyes Blurred vision?: No Double vision?: No  Ears/Nose/Throat Sore throat?: No Sinus problems?: No  Hematologic/Lymphatic Swollen glands?: No Easy bruising?: No  Cardiovascular Leg swelling?: No Chest pain?: No  Respiratory Cough?: No Shortness of breath?: No  Endocrine Excessive thirst?: No  Musculoskeletal Back pain?: No Joint pain?: No  Neurological Headaches?: No Dizziness?: No  Psychologic Depression?: No Anxiety?: No  Physical Exam: BP 130/88   Pulse 74   Ht 6' (1.829 m)   Wt 180 lb (81.6 kg)   BMI 24.41 kg/m   Constitutional:  Alert and oriented, No acute distress. HEENT: [place] AT, moist mucus membranes.  Trachea midline, no masses. Cardiovascular: No clubbing, cyanosis, or edema. Respiratory: Normal respiratory effort, no increased work of breathing. GI: Abdomen is soft, nontender.  In absence of Valsalva, a moderate sized right inguinal hernias palpable when examined in the supine position.  It is easily reducible. GU: No CVA tenderness. Skin: No rashes, bruises or suspicious lesions. Neurologic: Grossly intact, no focal deficits, moving all 4 extremities. Psychiatric: Normal mood and affect.  Laboratory Data: Lab Results  Component Value Date   WBC 9.1 11/17/2016   HGB 11.9 (L) 11/17/2016   HCT 33.7 (L) 11/17/2016   MCV 96.4 11/17/2016   PLT 255 11/17/2016    Lab Results  Component Value Date   CREATININE 0.79 11/17/2016    Urinalysis N/a  Pertinent Imaging: CLINICAL DATA:  Nephrolithiasis.  EXAM: ABDOMEN - 1 VIEW  COMPARISON:  Radiograph of  September 03, 2016.  FINDINGS: The bowel gas pattern is normal. Stable left renal calculus is noted. Right ureteral calculi noted on prior exam are no longer visualized. Stable phleboliths are noted in the pelvis.  IMPRESSION: Stable left renal calculus. Right ureteral calculi noted on prior exam no longer present.   Electronically Signed   By: Marijo Conception, M.D.   On: 09/17/2016 13:18   KUB personally reviewed and compared to previous X ray.  The stone appears to be ever so slightly larger, now with a commet type tail rather than spherical as previous.  This was also reviewed with the patient personally.  Assessment & Plan:    1. Kidney stone on left side 6+ mm LUP asymptomatic renal calculus, appears to be possibly enlarging ever so slightly Options including continued observation, ureteroscopy, shockwave lithotripsy reviewed again in detail. He is previously done well with shockwave lithotripsy and is interested in treating the stone prophylactically.  Risk and benefits  of shockwave lithotripsy were reviewed again today in detail including risk of bleeding, infection, hematoma, damage to surrounding structures, failure of the procedure, Steinstrasse, need for further intervention, splenic injury all were discussed.  He understands the risk.  He would like to proceed with this tomorrow.  Consent and paperwork signed.  2. Right inguinal hernia Enlarging symptomatic right inguinal hernia - Ambulatory referral to [place], MD  [place] [address] [place], [place] [address] [phone]

## 2017-03-24 ENCOUNTER — Other Ambulatory Visit: Payer: Self-pay

## 2017-03-24 ENCOUNTER — Ambulatory Visit: Payer: PPO

## 2017-03-24 ENCOUNTER — Ambulatory Visit
Admission: RE | Admit: 2017-03-24 | Discharge: 2017-03-24 | Disposition: A | Discharge status: Home / Self Care | Payer: PPO | Source: Ambulatory Visit | Attending: Urology | Admitting: Urology

## 2017-03-24 ENCOUNTER — Encounter
Admission: RE | Disposition: A | Discharge status: Home / Self Care | Payer: Self-pay | Source: Ambulatory Visit | Attending: Urology

## 2017-03-24 DIAGNOSIS — Z79899 Other long term (current) drug therapy: Secondary | ICD-10-CM | POA: Insufficient documentation

## 2017-03-24 DIAGNOSIS — K4090 Unilateral inguinal hernia, without obstruction or gangrene, not specified as recurrent: Secondary | ICD-10-CM | POA: Diagnosis not present

## 2017-03-24 DIAGNOSIS — Z8719 Personal history of other diseases of the digestive system: Secondary | ICD-10-CM | POA: Diagnosis not present

## 2017-03-24 DIAGNOSIS — I700 Atherosclerosis of aorta: Secondary | ICD-10-CM | POA: Insufficient documentation

## 2017-03-24 DIAGNOSIS — Z87891 Personal history of nicotine dependence: Secondary | ICD-10-CM | POA: Insufficient documentation

## 2017-03-24 DIAGNOSIS — Z885 Allergy status to narcotic agent status: Secondary | ICD-10-CM | POA: Diagnosis not present

## 2017-03-24 DIAGNOSIS — Z87442 Personal history of urinary calculi: Secondary | ICD-10-CM | POA: Diagnosis not present

## 2017-03-24 DIAGNOSIS — Z0000 Encounter for general adult medical examination without abnormal findings: Secondary | ICD-10-CM | POA: Diagnosis not present

## 2017-03-24 DIAGNOSIS — M1990 Unspecified osteoarthritis, unspecified site: Secondary | ICD-10-CM | POA: Insufficient documentation

## 2017-03-24 DIAGNOSIS — Z96651 Presence of right artificial knee joint: Secondary | ICD-10-CM | POA: Diagnosis not present

## 2017-03-24 DIAGNOSIS — Z791 Long term (current) use of non-steroidal anti-inflammatories (NSAID): Secondary | ICD-10-CM | POA: Insufficient documentation

## 2017-03-24 DIAGNOSIS — N400 Enlarged prostate without lower urinary tract symptoms: Secondary | ICD-10-CM | POA: Insufficient documentation

## 2017-03-24 DIAGNOSIS — N200 Calculus of kidney: Secondary | ICD-10-CM | POA: Diagnosis not present

## 2017-03-24 HISTORY — PX: EXTRACORPOREAL SHOCK WAVE LITHOTRIPSY: SHX1557

## 2017-03-24 SURGERY — LITHOTRIPSY, ESWL
Anesthesia: Moderate Sedation | Laterality: Left

## 2017-03-24 MED ORDER — CIPROFLOXACIN HCL 500 MG PO TABS
ORAL_TABLET | ORAL | Status: AC
  Administered 2017-03-24: 500 mg via ORAL
  Filled 2017-03-24: qty 1

## 2017-03-24 MED ORDER — DIAZEPAM 5 MG PO TABS
ORAL_TABLET | ORAL | Status: AC
  Administered 2017-03-24: 10 mg via ORAL
  Filled 2017-03-24: qty 2

## 2017-03-24 MED ORDER — ONDANSETRON HCL 4 MG/2ML IJ SOLN
INTRAMUSCULAR | Status: AC
  Administered 2017-03-24: 4 mg via INTRAVENOUS
  Filled 2017-03-24: qty 2

## 2017-03-24 MED ORDER — SODIUM CHLORIDE 0.9 % IV SOLN
INTRAVENOUS | Status: DC
  Administered 2017-03-24: 08:00:00 via INTRAVENOUS

## 2017-03-24 MED ORDER — ONDANSETRON HCL 4 MG/2ML IJ SOLN
4.0000 mg | Freq: Once | INTRAMUSCULAR | Status: AC | PRN
  Administered 2017-03-24: 4 mg via INTRAVENOUS

## 2017-03-24 MED ORDER — HYDROCODONE-ACETAMINOPHEN 5-325 MG PO TABS: 1.0000 | ORAL_TABLET | Freq: Four times a day (QID) | ORAL | 0 refills | Status: DC | PRN

## 2017-03-24 MED ORDER — DIPHENHYDRAMINE HCL 25 MG PO CAPS
25.0000 mg | ORAL_CAPSULE | ORAL | Status: AC
  Administered 2017-03-24: 25 mg via ORAL

## 2017-03-24 MED ORDER — TAMSULOSIN HCL 0.4 MG PO CAPS: 0.4000 mg | ORAL_CAPSULE | Freq: Every day | ORAL | 0 refills | Status: DC

## 2017-03-24 MED ORDER — DIPHENHYDRAMINE HCL 25 MG PO CAPS
ORAL_CAPSULE | ORAL | Status: AC
  Administered 2017-03-24: 25 mg via ORAL
  Filled 2017-03-24: qty 1

## 2017-03-24 MED ORDER — DIAZEPAM 5 MG PO TABS
10.0000 mg | ORAL_TABLET | ORAL | Status: AC
  Administered 2017-03-24: 10 mg via ORAL

## 2017-03-24 NOTE — Interval H&P Note (Signed)
History and Physical Interval Note:  03/24/2017 10:08 AM  Leonard Stokes  has presented today for surgery, with the diagnosis of Kidney stone  The various methods of treatment have been discussed with the patient and family. After consideration of risks, benefits and other options for treatment, the patient has consented to  Procedure(s): EXTRACORPOREAL SHOCK WAVE LITHOTRIPSY (ESWL) (Left) as a surgical intervention .  The patient's history has been reviewed, patient examined, no change in status, stable for surgery.  I have reviewed the patient's chart and labs.  Questions were answered to the patient's satisfaction.     [name]

## 2017-03-24 NOTE — Discharge Instructions (Signed)
See [facility] discharge instructions in chart.  AMBULATORY SURGERY  DISCHARGE INSTRUCTIONS   1) The drugs that you were given will stay in your system until tomorrow so for the next 24 hours you should not:  A) Drive an automobile B) Make any legal decisions C) Drink any alcoholic beverage   2) You may resume regular meals tomorrow.  Today it is better to start with liquids and gradually work up to solid foods.  You may eat anything you prefer, but it is better to start with liquids, then soup and crackers, and gradually work up to solid foods.   3) Please notify your doctor immediately if you have any unusual bleeding, trouble breathing, redness and pain at the surgery site, drainage, fever, or pain not relieved by medication.    4) Additional Instructions:        Please contact your physician with any problems or Same Day Surgery at [phone], Monday through Friday 6 am to 4 pm, or [place] [facility] number at [phone].  

## 2017-03-25 ENCOUNTER — Encounter: Payer: Self-pay | Admitting: Urology

## 2017-04-11 ENCOUNTER — Ambulatory Visit
Admission: RE | Admit: 2017-04-11 | Discharge: 2017-04-11 | Disposition: A | Discharge status: Home / Self Care | Payer: PPO | Source: Ambulatory Visit | Attending: Urology | Admitting: Urology

## 2017-04-11 DIAGNOSIS — N200 Calculus of kidney: Secondary | ICD-10-CM | POA: Diagnosis not present

## 2017-04-12 ENCOUNTER — Encounter: Payer: Self-pay | Admitting: Urology

## 2017-04-12 ENCOUNTER — Ambulatory Visit: Payer: PPO | Admitting: Urology

## 2017-04-13 ENCOUNTER — Ambulatory Visit (INDEPENDENT_AMBULATORY_CARE_PROVIDER_SITE_OTHER): Payer: PPO | Admitting: Urology

## 2017-04-13 ENCOUNTER — Encounter: Payer: Self-pay | Admitting: *Deleted

## 2017-04-13 ENCOUNTER — Encounter: Payer: Self-pay | Admitting: Urology

## 2017-04-13 VITALS — BP 127/85 | HR 86 | Ht 72.0 in | Wt 175.0 lb

## 2017-04-13 DIAGNOSIS — N402 Nodular prostate without lower urinary tract symptoms: Secondary | ICD-10-CM

## 2017-04-13 DIAGNOSIS — N200 Calculus of kidney: Secondary | ICD-10-CM

## 2017-04-13 DIAGNOSIS — R972 Elevated prostate specific antigen [PSA]: Secondary | ICD-10-CM | POA: Diagnosis not present

## 2017-04-13 NOTE — Progress Notes (Signed)
04/13/2017 8:49 AM   Leonard Stokes November 16, 1945 [id number]  Referring provider: Derinda Late, MD [phone] [address] [facility] [place], [place] [address]  Chief Complaint  Patient presents with  . Nephrolithiasis    2wk w/KUB    HPI: 72 year old male who returns to the office today following left ESWL for a 6 mm nonobstructing stone.  He reports today that the procedure was uncomplicated.  He had no flank pain following the procedure.  He has no associated urinary symptoms.  He is overall feeling well.  He did see some small fragments pass but was unable to collect these.  He also mention today that his PCP is worried about his rising PSA.  PSA trend as below.  Most recent PSA up to 4.84 as of 03/10/2017.  He denies a family history of prostate cancer.  He reports that his dad died in his 68s of non-GU related issues.  He does think his father may have had a elevated PSA without a cancer diagnosis.  He denies any significant baseline urinary symptoms.  He has an excellent urinary stream and feels like he is able to empty his bladder.  No urgency or frequency.  He gets up once at night to void but this is not bothersome to him.  PSA trend: 4.84 03/10/2017 3.35 12/22/2015 2.23 12/30/2014 2.42 01/10/2014  PMH: Past Medical History:  Diagnosis Date  . Arthritis   . Atherosclerosis of aorta ([place]) 2018  . BPH (benign prostatic hyperplasia)   . Chronic kidney disease 08/2016   kidney stone  . Diverticulosis of sigmoid colon   . Leg cramps     Surgical History: Past Surgical History:  Procedure Laterality Date  . APPENDECTOMY    . EXTRACORPOREAL SHOCK WAVE LITHOTRIPSY Right 08/19/2016   Procedure: EXTRACORPOREAL SHOCK WAVE LITHOTRIPSY (ESWL);  Surgeon: [name], MD;  Location: [facility] ORS;  Service: Urology;  Laterality: Right;  . EXTRACORPOREAL SHOCK WAVE LITHOTRIPSY Left 03/24/2017   Procedure: EXTRACORPOREAL SHOCK WAVE LITHOTRIPSY  (ESWL);  Surgeon: [name], MD;  Location: [facility] ORS;  Service: Urology;  Laterality: Left;  . EYE MUSCLE SURGERY    . KNEE ARTHROPLASTY Left 11/15/2016   Procedure: COMPUTER ASSISTED TOTAL KNEE ARTHROPLASTY;  Surgeon: Dereck Leep, MD;  Location: [facility] ORS;  Service: Orthopedics;  Laterality: Left;  . TOTAL KNEE ARTHROPLASTY Right     Home Medications:  Allergies as of 04/13/2017      Reactions   Codeine Rash      Medication List        Accurate as of 04/13/17  8:49 AM. Always use your most recent med list.          acetaminophen 500 MG tablet Commonly known as:  TYLENOL Take 1,500 mg by mouth 3 (three) times daily.   B COMPLEX-B12 PO Take 1 tablet by mouth daily.   CAYENNE-GARLIC PO Take 1 capsule by mouth daily.   OSTEO BI-FLEX TRIPLE STRENGTH PO Take 2 tablets by mouth daily.   COD LIVER OIL PO Take 2 capsules by mouth daily.   losartan-hydrochlorothiazide 50-12.5 MG tablet Commonly known as:  HYZAAR Take 1 tablet by mouth daily.   MAGNESIUM PO Take 140 mg by mouth daily.   meloxicam 15 MG tablet Commonly known as:  MOBIC Take 15 mg by mouth daily.   OVER THE COUNTER MEDICATION Take 2 tablets by mouth daily. OTC Cramp Defense   SENIOR MULTIVITAMIN PLUS PO Take 1 tablet by  mouth daily.   sildenafil 20 MG tablet Commonly known as:  REVATIO Take 20 mg by mouth daily as needed (for ED).   tamsulosin 0.4 MG Caps capsule Commonly known as:  FLOMAX Take 1 capsule (0.4 mg total) by mouth daily.   TURMERIC CURCUMIN PO Take 2,000 mg by mouth daily.   vitamin C 1000 MG tablet Take 1,000 mg by mouth daily.   Vitamin D3 5000 units Caps Take 5,000 Units by mouth daily.   zolpidem 10 MG tablet Commonly known as:  AMBIEN Take 5 mg by mouth at bedtime as needed for sleep       Allergies:  Allergies  Allergen Reactions  . Codeine Rash    Family History: No family history on file.  Social History:  reports that he quit smoking about 20  years ago. he has never used smokeless tobacco. He reports that he drinks alcohol. He reports that he does not use drugs.  ROS: UROLOGY Frequent Urination?: No Hard to postpone urination?: No Burning/pain with urination?: No Get up at night to urinate?: No Leakage of urine?: No Urine stream starts and stops?: No Trouble starting stream?: No Do you have to strain to urinate?: No Blood in urine?: No Urinary tract infection?: No Sexually transmitted disease?: No Injury to kidneys or bladder?: No Painful intercourse?: No Weak stream?: No Erection problems?: No Penile pain?: No  Gastrointestinal Nausea?: No Vomiting?: No Indigestion/heartburn?: No Diarrhea?: No Constipation?: No  Constitutional Fever: No Night sweats?: No Weight loss?: No Fatigue?: No  Skin Skin rash/lesions?: No Itching?: No  Eyes Blurred vision?: No Double vision?: No  Ears/Nose/Throat Sore throat?: No Sinus problems?: No  Hematologic/Lymphatic Swollen glands?: No Easy bruising?: No  Cardiovascular Leg swelling?: No Chest pain?: No  Respiratory Cough?: No Shortness of breath?: No  Endocrine Excessive thirst?: No  Musculoskeletal Back pain?: No Joint pain?: No  Neurological Headaches?: No Dizziness?: No  Psychologic Depression?: No Anxiety?: No  Physical Exam: BP 127/85   Pulse 86   Ht 6' (1.829 m)   Wt 175 lb (79.4 kg)   BMI 23.73 kg/m   Constitutional:  Alert and oriented, No acute distress. HEENT: [place] AT, moist mucus membranes.  Trachea midline, no masses. Cardiovascular: No clubbing, cyanosis, or edema. Respiratory: Normal respiratory effort, no increased work of breathing. GI: Abdomen is soft, nontender, nondistended, no abdominal masses GU: No CVA tenderness Rectal: Normal sphincter tone, + external hemorrhoids, 50 g prostate with small 0.5 cm nodule at right base.  Subtle induration of the right lateral lobe.   Skin: No rashes, bruises or suspicious  lesions. Neurologic: Grossly intact, no focal deficits, moving all 4 extremities. Psychiatric: Normal mood and affect.  Laboratory Data: Lab Results  Component Value Date   WBC 9.1 11/17/2016   HGB 11.9 (L) 11/17/2016   HCT 33.7 (L) 11/17/2016   MCV 96.4 11/17/2016   PLT 255 11/17/2016    Lab Results  Component Value Date   CREATININE 0.79 11/17/2016    Urinalysis N/a  Pertinent Imaging: CLINICAL DATA:  Status post lithotripsy.  EXAM: ABDOMEN - 1 VIEW  COMPARISON:  Radiographs of March 24, 2016.  FINDINGS: The bowel gas pattern is normal. Phleboliths are noted in the pelvis. Left renal calculus noted on prior exam is not well visualized currently.  IMPRESSION: Left renal calculus seen on prior exam is not well visualized currently. No evidence of bowel obstruction or ileus.   Electronically Signed   By: Marijo Conception, M.D.   On:  04/12/2017 08:37  KUB reviewed personally.  It appears that there is no evidence of residual stone.   Assessment & Plan:    1. Kidney stone on left side No evidence of residual stone matter on KUB.   Status post successful ESWL Reviewed stone diet We will follow with serial imaging  2. Rising PSA level We discussed his overall PSA trend today which is somewhat concerning as well as abnormal rectal exam.  Given his consolation of findings, I strongly recommended prostate biopsy.  We discussed prostate biopsy in detail including the procedure itself, the risks of blood in the urine, stool, and ejaculate, serious infection, and discomfort. He is willing to proceed with this as discussed.  3. Prostate nodule As above   Return in about 3 months (around 07/14/2017).  [name], MD  [facility] [address], [place], [place] [address] [phone]  I spent 25 min with this patient of which greater than 50% was spent in counseling and coordination of care with the patient.

## 2017-04-13 NOTE — Patient Instructions (Signed)
Transrectal Ultrasound-Guided Biopsy A transrectal ultrasound-guided biopsy is a procedure to take samples of tissue from your prostate. Ultrasound images are used to guide the procedure. It is usually done to check the prostate gland for cancer. What happens before the procedure?  Do not eat or drink after midnight on the night before your procedure.  Take medicines as your doctor tells you.  Your doctor may have you stop taking some medicines 5-7 days before the procedure.  You will be given an enema before your procedure. During an enema, a liquid is put into your butt (rectum) to clear out waste.  You may have lab tests the day of your procedure.  Make plans to have someone drive you home. What happens during the procedure?  You will be given medicine to help you relax before the procedure. An IV tube will be put into one of your veins. It will be used to give fluids and medicine.  You will be given medicine to reduce the risk of infection (antibiotic).  You will be placed on your side.  A probe with gel will be put in your butt. This is used to take pictures of your prostate and the area around it.  A medicine to numb the area is put into your prostate.  A biopsy needle is then inserted and guided to your prostate.  Samples of prostate tissue are taken. The needle is removed.  The samples are sent to a lab to be checked. Results are usually back in 2-3 days. What happens after the procedure?  You will be taken to a room where you will be watched until you are doing okay.  You may have some pain in the area around your butt. You will be given medicines for this.  You may be able to go home the same day. Sometimes, an overnight stay in the hospital is needed. This information is not intended to replace advice given to you by your health care provider. Make sure you discuss any questions you have with your health care provider. Document Released: 01/06/2009 Document Revised:  06/26/2015 Document Reviewed: 09/06/2012 Elsevier Interactive Patient Education  2018 Elsevier Inc.  

## 2017-04-14 ENCOUNTER — Encounter: Payer: Self-pay | Admitting: General Surgery

## 2017-04-14 ENCOUNTER — Ambulatory Visit (INDEPENDENT_AMBULATORY_CARE_PROVIDER_SITE_OTHER): Payer: PPO | Admitting: General Surgery

## 2017-04-14 VITALS — BP 120/70 | HR 68 | Resp 12 | Ht 72.0 in | Wt 179.0 lb

## 2017-04-14 DIAGNOSIS — K4090 Unilateral inguinal hernia, without obstruction or gangrene, not specified as recurrent: Secondary | ICD-10-CM

## 2017-04-14 NOTE — Progress Notes (Signed)
Patient ID: Leonard Stokes, male   DOB: 1946/01/07, 72 y.o.   MRN: [id number]  Chief Complaint  Patient presents with  . Other    HPI Leonard Stokes is a 72 y.o. male.  Here today for a evaluation of a right inguinal hernia. Patient noticed this area about two years ago. He states he was lifting a heavy box and noticed this burning area. The area has change in size and he still gets burning pain .  HPI  Past Medical History:  Diagnosis Date  . Arthritis   . Atherosclerosis of aorta ([place]) 2018  . BPH (benign prostatic hyperplasia)   . Chronic kidney disease 08/2016   kidney stone  . Diverticulosis of sigmoid colon   . Leg cramps     Past Surgical History:  Procedure Laterality Date  . APPENDECTOMY    . EXTRACORPOREAL SHOCK WAVE LITHOTRIPSY Right 08/19/2016   Procedure: EXTRACORPOREAL SHOCK WAVE LITHOTRIPSY (ESWL);  Surgeon: [name], MD;  Location: [facility] ORS;  Service: Urology;  Laterality: Right;  . EXTRACORPOREAL SHOCK WAVE LITHOTRIPSY Left 03/24/2017   Procedure: EXTRACORPOREAL SHOCK WAVE LITHOTRIPSY (ESWL);  Surgeon: [name], MD;  Location: [facility] ORS;  Service: Urology;  Laterality: Left;  . EYE MUSCLE SURGERY    . KNEE ARTHROPLASTY Left 11/15/2016   Procedure: COMPUTER ASSISTED TOTAL KNEE ARTHROPLASTY;  Surgeon: Dereck Leep, MD;  Location: [facility] ORS;  Service: Orthopedics;  Laterality: Left;  . TOTAL KNEE ARTHROPLASTY Right     No family history on file.  Social History Social History   Tobacco Use  . Smoking status: Former Smoker    Last attempt to quit: 05/31/1996    Years since quitting: 20.8  . Smokeless tobacco: Never Used  Substance Use Topics  . Alcohol use: Yes    Comment: 4-5 week  . Drug use: No    Allergies  Allergen Reactions  . Codeine Rash    Current Outpatient Medications  Medication Sig Dispense Refill  . acetaminophen (TYLENOL) 500 MG tablet Take 1,500 mg by mouth 3 (three) times daily.    . Ascorbic Acid (VITAMIN C) 1000 MG  tablet Take 1,000 mg by mouth daily.    . B Complex Vitamins (B COMPLEX-B12 PO) Take 1 tablet by mouth daily.    . Cholecalciferol (VITAMIN D3) 5000 units CAPS Take 5,000 Units by mouth daily.    . COD LIVER OIL PO Take 2 capsules by mouth daily.    Marland Kitchen losartan-hydrochlorothiazide (HYZAAR) 50-12.5 MG tablet Take 1 tablet by mouth daily.    Marland Kitchen MAGNESIUM PO Take 140 mg by mouth daily.    . meloxicam (MOBIC) 15 MG tablet Take 15 mg by mouth daily.     . Misc Natural Products (CAYENNE-GARLIC PO) Take 1 capsule by mouth daily.    . Misc Natural Products (OSTEO BI-FLEX TRIPLE STRENGTH PO) Take 2 tablets by mouth daily.    . Multiple Vitamins-Minerals (SENIOR MULTIVITAMIN PLUS PO) Take 1 tablet by mouth daily.    Marland Kitchen OVER THE COUNTER MEDICATION Take 2 tablets by mouth daily. OTC Cramp Defense    . sildenafil (REVATIO) 20 MG tablet Take 20 mg by mouth daily as needed (for ED).    Marland Kitchen tamsulosin (FLOMAX) 0.4 MG CAPS capsule Take 1 capsule (0.4 mg total) by mouth daily. 30 capsule 0  . TURMERIC CURCUMIN PO Take 2,000 mg by mouth daily.     Marland Kitchen zolpidem (AMBIEN) 10 MG tablet Take 5 mg by mouth at bedtime as needed for  sleep     No current facility-administered medications for this visit.     Review of Systems Review of Systems  Constitutional: Negative.   Respiratory: Negative.   Cardiovascular: Negative.     Blood pressure 120/70, pulse 68, resp. rate 12, height 6' (1.829 m), weight 179 lb (81.2 kg).  Physical Exam Physical Exam  Constitutional: He is oriented to person, place, and time. He appears well-developed and well-nourished.  Cardiovascular: Normal rate, regular rhythm and normal heart sounds.  Pulmonary/Chest: Effort normal and breath sounds normal.  Abdominal: Soft. Normal appearance and bowel sounds are normal. A hernia is present. Hernia confirmed positive in the right inguinal area.  Genitourinary:     Neurological: He is alert and oriented to person, place, and time.  Skin: Skin is  warm and dry.    Data Reviewed Laboratory studies dated November 17, 2016 showed a hemoglobin of 11.9 with an MCV of 96, white blood cell count of 9100 with normal differential.  Hemoglobin is down 2 points from November 08, 2016.  This value was prior to his total knee replacement.   Basic metabolic panel of the same day showed a mild hyponatremia with a sodium of 131.  Normal renal function.  Assessment    Symptomatic right inguinal hernia.  Easily reducible.    Plan  The patient was encouraged to contact the urology office to determine if preprocedure antibiotics would be appropriate in light of his bilateral total knee replacement.  I have suggested deferring elective repair of his hernia which will entail mesh implantation until after his prostate biopsy due to the possibility of bacteremia during the procedure.   Hernia precautions and incarceration were discussed with the patient. If they develop symptoms of an incarcerated hernia, they were encouraged to seek prompt medical attention.  I have recommended repair of the hernia using mesh on an outpatient basis in the near future. The risk of infection was reviewed. The role of prosthetic mesh to minimize the risk of recurrence was reviewed.    HPI, Physical Exam, Assessment and Plan have been scribed under the direction and in the presence of Hervey Ard, MD.  Gaspar Cola, CMA  I have completed the exam and reviewed the above documentation for accuracy and completeness.  I agree with the above.  Haematologist has been used and any errors in dictation or transcription are unintentional.  Hervey Ard, M.D., F.A.C.S.  Leonard Stokes 04/15/2017, 7:29 PM

## 2017-04-14 NOTE — Patient Instructions (Signed)

## 2017-04-15 DIAGNOSIS — K4090 Unilateral inguinal hernia, without obstruction or gangrene, not specified as recurrent: Secondary | ICD-10-CM | POA: Insufficient documentation

## 2017-05-24 ENCOUNTER — Other Ambulatory Visit: Payer: Self-pay | Admitting: Urology

## 2017-05-24 ENCOUNTER — Ambulatory Visit: Payer: PPO | Admitting: Urology

## 2017-05-24 ENCOUNTER — Encounter: Payer: Self-pay | Admitting: Urology

## 2017-05-24 VITALS — BP 132/80 | HR 72 | Ht 72.0 in | Wt 180.0 lb

## 2017-05-24 DIAGNOSIS — R972 Elevated prostate specific antigen [PSA]: Secondary | ICD-10-CM

## 2017-05-24 DIAGNOSIS — C61 Malignant neoplasm of prostate: Secondary | ICD-10-CM | POA: Diagnosis not present

## 2017-05-24 DIAGNOSIS — N402 Nodular prostate without lower urinary tract symptoms: Secondary | ICD-10-CM

## 2017-05-24 MED ORDER — LEVOFLOXACIN 500 MG PO TABS
500.0000 mg | ORAL_TABLET | Freq: Once | ORAL | Status: AC
  Administered 2017-05-24: 500 mg via ORAL

## 2017-05-24 MED ORDER — GENTAMICIN SULFATE 40 MG/ML IJ SOLN
80.0000 mg | Freq: Once | INTRAMUSCULAR | Status: AC
  Administered 2017-05-24: 80 mg via INTRAMUSCULAR

## 2017-05-24 NOTE — Progress Notes (Signed)
   05/24/17  CC:  Chief Complaint  Patient presents with  . Prostate Biopsy    HPI: 72 year old male with a rising PSA up to 4.84 as well as a prostate nodule who presents today for prostate biopsy.  He had a palpable nodule at the right base.  Blood pressure 132/80, pulse 72, height 6' (1.829 m), weight 180 lb (81.6 kg). NED. A&Ox3.   No respiratory distress   Abd soft, NT, ND Normal sphincter tone  Prostate Biopsy Procedure   Informed consent was obtained after discussing risks/benefits of the procedure.  A time out was performed to ensure correct patient identity.  Pre-Procedure: - Gentamicin given prophylactically - Levaquin 500 mg administered PO -Transrectal Ultrasound performed revealing a 81.85 gm prostate -No significant hypoechoic or median lobe noted  - An additional biopsy of the right mid gland was obtained as there appeared to be a nodule on ultrasound which was sampled and added to the specimen  Procedure: - Prostate block performed using 10 cc 1% lidocaine and biopsies taken from sextant areas, a total of 12 under ultrasound guidance.  Post-Procedure: - Patient tolerated the procedure well - He was counseled to seek immediate medical attention if experiences any severe pain, significant bleeding, or fevers - Return in two week to discuss biopsy results  [name], MD

## 2017-05-26 ENCOUNTER — Other Ambulatory Visit: Payer: PPO

## 2017-05-28 LAB — PATHOLOGY REPORT

## 2017-05-30 ENCOUNTER — Other Ambulatory Visit: Payer: Self-pay | Admitting: Urology

## 2017-05-30 ENCOUNTER — Telehealth: Payer: Self-pay | Admitting: Urology

## 2017-05-30 DIAGNOSIS — C61 Malignant neoplasm of prostate: Secondary | ICD-10-CM

## 2017-05-30 NOTE — Telephone Encounter (Signed)
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  Patient will need staging with CT scan and bone scan.  Discussed ideally he would have the studies prior to his follow-up on May 9.  He is agreeable this plan.  All questions answered for the time being.  [name], MD

## 2017-05-30 NOTE — Telephone Encounter (Signed)
Prior Auth done and scheduling should be calling him today .  Sharyn Lull

## 2017-06-02 ENCOUNTER — Ambulatory Visit: Payer: PPO

## 2017-06-03 ENCOUNTER — Encounter
Admission: RE | Admit: 2017-06-03 | Discharge: 2017-06-03 | Disposition: A | Discharge status: Home / Self Care | Payer: PPO | Source: Ambulatory Visit | Attending: Urology | Admitting: Urology

## 2017-06-03 ENCOUNTER — Ambulatory Visit
Admission: RE | Admit: 2017-06-03 | Discharge: 2017-06-03 | Disposition: A | Discharge status: Home / Self Care | Payer: PPO | Source: Ambulatory Visit | Attending: Urology | Admitting: Urology

## 2017-06-03 DIAGNOSIS — C61 Malignant neoplasm of prostate: Secondary | ICD-10-CM | POA: Diagnosis not present

## 2017-06-03 DIAGNOSIS — Q268 Other congenital malformations of great veins: Secondary | ICD-10-CM | POA: Insufficient documentation

## 2017-06-03 DIAGNOSIS — R590 Localized enlarged lymph nodes: Secondary | ICD-10-CM | POA: Diagnosis not present

## 2017-06-03 DIAGNOSIS — K4090 Unilateral inguinal hernia, without obstruction or gangrene, not specified as recurrent: Secondary | ICD-10-CM | POA: Insufficient documentation

## 2017-06-03 DIAGNOSIS — I700 Atherosclerosis of aorta: Secondary | ICD-10-CM | POA: Diagnosis not present

## 2017-06-03 HISTORY — DX: Malignant (primary) neoplasm, unspecified: C80.1

## 2017-06-03 LAB — POCT I-STAT CREATININE: CREATININE: 1 mg/dL (ref 0.61–1.24)

## 2017-06-03 MED ORDER — IOPAMIDOL (ISOVUE-370) INJECTION 76%
75.0000 mL | Freq: Once | INTRAVENOUS | Status: AC | PRN
  Administered 2017-06-03: 75 mL via INTRAVENOUS

## 2017-06-03 MED ORDER — TECHNETIUM TC 99M MEDRONATE IV KIT
20.0000 | PACK | Freq: Once | INTRAVENOUS | Status: AC | PRN
  Administered 2017-06-03: 21.37 via INTRAVENOUS

## 2017-06-09 ENCOUNTER — Encounter: Payer: Self-pay | Admitting: Urology

## 2017-06-09 ENCOUNTER — Ambulatory Visit: Payer: PPO | Admitting: Urology

## 2017-06-09 ENCOUNTER — Telehealth: Payer: Self-pay | Admitting: Urology

## 2017-06-09 VITALS — BP 154/83 | HR 72 | Ht 72.0 in | Wt 175.0 lb

## 2017-06-09 DIAGNOSIS — C61 Malignant neoplasm of prostate: Secondary | ICD-10-CM | POA: Diagnosis not present

## 2017-06-09 DIAGNOSIS — K4090 Unilateral inguinal hernia, without obstruction or gangrene, not specified as recurrent: Secondary | ICD-10-CM | POA: Diagnosis not present

## 2017-06-09 MED ORDER — DEGARELIX ACETATE 120 MG ~~LOC~~ SOLR
240.0000 mg | Freq: Once | SUBCUTANEOUS | Status: AC
  Administered 2017-06-09: 240 mg via SUBCUTANEOUS

## 2017-06-09 NOTE — Progress Notes (Signed)
Firmagon Sub Q Injection  Due to Prostate Cancer patient is present today for a Firmagon Injection.   Medication: Mills Koller (Degarelix)  Dose: 240mg  Location: right & left upper abdomen Lot: [id number] Exp: 09/2017  Patient tolerated well, no complications were noted  Performed by: Fonnie Jarvis, CMA and Darrick Grinder, CMA  Follow up: 1 month

## 2017-06-09 NOTE — Telephone Encounter (Signed)
Injection was started today.

## 2017-06-09 NOTE — Progress Notes (Signed)
06/09/2017 7:26 PM   Leonard Stokes [birth year] [id number]  Referring provider: Derinda Late, MD [phone] [facility] [place], [place] [address]  Chief Complaint  Patient presents with  . 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  He was seen for evaluation of rising PSA up to 4.84.  He was also found to have a palpable nodule at the right base, T2b  He underwent prostate biopsy on 05/24/2017.  TRUS volume 82 g.  Unfortunately, prostate biopsy was consistent with high risk prostate cancer, Gleason 4+5 and 4+4 involving all cores on the right up to 96% of the tissue.  No left-sided involvement.  He was called with the pathology results and underwent further staging with CT abdomen pelvis which revealed significant external lymphadenopathy bilaterally measuring up to 3 cm on the left and 1.8 cm in largest diameter on the right.  This is not appreciated on the scan in 08/2016.  Bone scan was negative for any evidence of osseous mets.  He does have a palpable and significant right inguinal hernia.  He denies any pain from the hernia but does describe it as a nuisance.  He would be interested in surgical intervention for this hernia.  IPSS as below.  He has no significant urinary symptoms.  He has minimal erectile dysfunction Shim below.    IPSS    Row Name 06/09/17 1400         International Prostate Symptom Score   How often have you had the sensation of not emptying your bladder?  Less than 1 in 5     How often have you had to urinate less than every two hours?  Less than 1 in 5 times     How often have you found you stopped and started again several times when you urinated?  Less than half the time     How often have you found it difficult to postpone urination?  Not at All     How often have you had a weak urinary  stream?  Less than half the time     How often have you had to strain to start urination?  Not at All     How many times did you typically get up at night to urinate?  1 Time     Total IPSS Score  7       Quality of Life due to urinary symptoms   If you were to spend the rest of your life with your urinary condition just the way it is now how would you feel about that?  Delighted        Score:  1-7 Mild 8-19 Moderate 20-35 Severe  SHIM    Row Name 06/09/17 1442         SHIM: Over the last 6 months:   How do you rate your confidence that you could get and keep an erection?  Moderate     When you had erections with sexual stimulation, how often were your erections hard enough for penetration (entering your partner)?  Most Times (much more than half the time)     During sexual intercourse, how often were you able to maintain your erection after you had penetrated (entered) your partner?  Most Times (much more than half the time)     During sexual intercourse, how  difficult was it to maintain your erection to completion of intercourse?  Slightly Difficult     When you attempted sexual intercourse, how often was it satisfactory for you?  Almost Always or Always       SHIM Total Score   SHIM  20          PMH: Past Medical History:  Diagnosis Date  . Arthritis   . Atherosclerosis of aorta ([place]) 2018  . BPH (benign prostatic hyperplasia)   . Cancer ([place])   . Chronic kidney disease 08/2016   kidney stone  . Diverticulosis of sigmoid colon   . Leg cramps     Surgical History: Past Surgical History:  Procedure Laterality Date  . APPENDECTOMY    . EXTRACORPOREAL SHOCK WAVE LITHOTRIPSY Right 08/19/2016   Procedure: EXTRACORPOREAL SHOCK WAVE LITHOTRIPSY (ESWL);  Surgeon: [name], MD;  Location: [facility];  Service: Urology;  Laterality: Right;  . EXTRACORPOREAL SHOCK WAVE LITHOTRIPSY Left 03/24/2017   Procedure: EXTRACORPOREAL SHOCK WAVE LITHOTRIPSY (ESWL);  Surgeon:  [name], MD;  Location: [facility] OR[facility];  Service: Urology;  Laterality: Left;  . EYE MUSCLE SURGERY    . KNEE ARTHROPLASTY Left 11/15/2016   Procedure: COMPUTER ASSISTED TOTAL KNEE ARTHROPLASTY;  Surgeon: Dereck Leep, MD;  Location: [facility] ORS;  Service: Orthopedics;  Laterality: Left;  . TOTAL KNEE ARTHROPLASTY Right     Home Medications:  Allergies as of 06/09/2017      Reactions   Codeine Rash      Medication List        Accurate as of 06/09/17  7:26 PM. Always use your most recent med list.          acetaminophen 500 MG tablet Commonly known as:  TYLENOL Take 1,500 mg by mouth 3 (three) times daily.   B COMPLEX-B12 PO Take 1 tablet by mouth daily.   CAYENNE-GARLIC PO Take 1 capsule by mouth daily.   OSTEO BI-FLEX TRIPLE STRENGTH PO Take 2 tablets by mouth daily.   COD LIVER OIL PO Take 2 capsules by mouth daily.   losartan-hydrochlorothiazide 50-12.5 MG tablet Commonly known as:  HYZAAR Take 1 tablet by mouth daily.   MAGNESIUM PO Take 140 mg by mouth daily.   meloxicam 15 MG tablet Commonly known as:  MOBIC Take 15 mg by mouth daily.   OVER THE COUNTER MEDICATION Take 2 tablets by mouth daily. OTC Cramp Defense   SENIOR MULTIVITAMIN PLUS PO Take 1 tablet by mouth daily.   sildenafil 20 MG tablet Commonly known as:  REVATIO Take 20 mg by mouth daily as needed (for ED).   tamsulosin 0.4 MG Caps capsule Commonly known as:  FLOMAX Take 1 capsule (0.4 mg total) by mouth daily.   TURMERIC CURCUMIN PO Take 2,000 mg by mouth daily.   vitamin C 1000 MG tablet Take 1,000 mg by mouth daily.   Vitamin D3 5000 units Caps Take 5,000 Units by mouth daily.   zolpidem 10 MG tablet Commonly known as:  AMBIEN Take 5 mg by mouth at bedtime as needed for sleep       Allergies:  Allergies  Allergen Reactions  . Codeine Rash    Family History: No family history on file.  Social History:  reports that he quit smoking about 21 years ago. He has never  used smokeless tobacco. He reports that he drinks alcohol. He reports that he does not use drugs.  ROS: UROLOGY Frequent Urination?: No Hard to postpone urination?: No Burning/pain with  urination?: No Get up at night to urinate?: No Leakage of urine?: No Urine stream starts and stops?: No Trouble starting stream?: No Do you have to strain to urinate?: No Blood in urine?: No Urinary tract infection?: No Sexually transmitted disease?: No Injury to kidneys or bladder?: No Painful intercourse?: No Weak stream?: No Erection problems?: No Penile pain?: No  Gastrointestinal Nausea?: No Vomiting?: No Indigestion/heartburn?: No Diarrhea?: No Constipation?: No  Constitutional Fever: No Night sweats?: No Weight loss?: No Fatigue?: No  Skin Skin rash/lesions?: No Itching?: No  Eyes Blurred vision?: No Double vision?: No  Ears/Nose/Throat Sore throat?: No Sinus problems?: No  Hematologic/Lymphatic Swollen glands?: No Easy bruising?: No  Cardiovascular Leg swelling?: No Chest pain?: No  Respiratory Cough?: No Shortness of breath?: No  Endocrine Excessive thirst?: No  Musculoskeletal Back pain?: No Joint pain?: No  Neurological Headaches?: No Dizziness?: No  Psychologic Depression?: No Anxiety?: No  Physical Exam: BP (!) 154/83   Pulse 72   Ht 6' (1.829 m)   Wt 175 lb (79.4 kg)   BMI 23.73 kg/m   Constitutional:  Alert and oriented, No acute distress.  Accompanied by wife today. HEENT: [place] AT, moist mucus membranes.  Trachea midline, no masses. Cardiovascular: No clubbing, cyanosis, or edema. Respiratory: Normal respiratory effort, no increased work of breathing. Skin: No rashes, bruises or suspicious lesions. Neurologic: Grossly intact, no focal deficits, moving all 4 extremities. Psychiatric: Normal mood and affect.  Laboratory Data: Lab Results  Component Value Date   WBC 9.1 11/17/2016   HGB 11.9 (L) 11/17/2016   HCT 33.7 (L) 11/17/2016    MCV 96.4 11/17/2016   PLT 255 11/17/2016    Lab Results  Component Value Date   CREATININE 1.00 06/03/2017    PSA as above  Urinalysis .n/a  Pertinent Imaging: CLINICAL DATA:  New diagnosis of prostate malignancy. Patient reports generalized aches and pains.  EXAM: NUCLEAR MEDICINE WHOLE BODY BONE SCAN  TECHNIQUE: Whole body anterior and posterior images were obtained approximately 3 hours after intravenous injection of radiopharmaceutical.  RADIOPHARMACEUTICALS:  21.37 mCi Technetium-58m MDP IV  COMPARISON:  None.  FINDINGS: There is adequate uptake of the radiopharmaceutical by the skeleton. There is adequate soft tissue clearance and renal activity.  Uptake over the calvarium and ribs is normal. Minimal uptake in the mid cervical spine on the left and in the mid lumbar spine on the right posteriorly is most compatible with degenerative change. Uptake within the upper extremities is consistent with degenerative change in the wrist and phalanges. Mildly increased uptake about both knees is compatible with degenerative change. There is a prosthetic left knee joint. There is increased uptake within the feet consistent with degenerative change.  IMPRESSION: No findings suspicious for metastatic disease to the skeleton. Areas of increased uptake as described most compatible with osteoarthritis.   Electronically Signed   By: Maria  Martinique M.D.   On: 06/06/2017 07:28  CLINICAL DATA:  Initial diagnosis of prostate cancer. Back pain. Kidney stones with lithotripsy 1 year ago. Known hernia. Appendectomy. Gleason grade greater than 8.  EXAM: CT ABDOMEN AND PELVIS WITH CONTRAST  TECHNIQUE: Multidetector CT imaging of the abdomen and pelvis was performed using the standard protocol following bolus administration of intravenous contrast.  CONTRAST:  24mL ISOVUE-370 IOPAMIDOL (ISOVUE-370) INJECTION 76%  COMPARISON:  Plain films 04/11/2017.  Most  recent CT 08/11/2016.  FINDINGS: Lower chest: Minimal motion degradation at the lung bases. Normal heart size without pericardial or pleural effusion.  Hepatobiliary: Tiny low-density  liver lesions are similar and likely cysts. No suspicious liver lesion. Normal gallbladder, without biliary ductal dilatation.  Pancreas: Normal, without mass or ductal dilatation.  Spleen: Normal in size, without focal abnormality.  Adrenals/Urinary Tract: Normal adrenal glands. Left renal too small to characterize lesions. Normal right kidney, without hydronephrosis pride or ureter. Anterior right-sided urinary bladder enters a right inguinal hernia, including on image 75/2, similar.  Stomach/Bowel: Proximal gastric underdistention. Extensive colonic diverticulosis. Normal terminal ileum. A proximal duodenal diverticulum is identified laterally on image 23/2. Otherwise normal small bowel.  Vascular/Lymphatic: Aortic and branch vessel atherosclerosis. Duplicated IVC. No retroperitoneal or retrocrural adenopathy. A right external iliac node is new, including at 1.8 x 1.4 cm on image 63/2. A new enlarged left external iliac node measures 1.3 x 3.0 cm on image 65/2.  Reproductive: Mild prostatomegaly.  Other: No significant free fluid.  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 No worrisome osseous lesion.  IMPRESSION: 1. Development of bilateral pelvic sidewall adenopathy, consistent with nodal metastasis. 2.  Aortic Atherosclerosis (ICD10-I70.0). 3. Right-sided urinary bladder entering a right inguinal hernia, similar. 4. Duplicated IVC.   Electronically Signed   By: Abigail Miyamoto M.D.   On: 06/03/2017 15:39   CT scan of the  abdomen pelvis as well as bone scan reviewed personally today.  Agree with above assessment.  Assessment & Plan:    1. Prostate cancer [facility]) Newly diagnosed high-risk prostate cancer will disease with  significant local regional metastatic lymphadenopathy.  NCCN guidelines as well as AUA guidelines were reviewed personally today with the patient, wife, and his son-in-law.  We discussed various options today including radical prostatectomy with extended pelvic lymph node dissection and ADT versus whole pelvic radiation with ADT versus ADT alone.  We discussed the risk and benefits of each.  Given his age, I feel that he will be best served with radiation plus ADT as primary treatment option in hopes of complete remission of his disease  We do lengthy discussion today about risk and benefits of androgen deprivation therapy including risk of weight gain obesity, also muscle mass, loss of bone density, hot flashes, cardiac side effects from extended exposure to ADT.  He was given information and was given time to make this decision over [place].  He ultimately elected to initiate Dr. Cristie Hem today and will transition to Lupron next month.  He was offered a second surgical opinion but declined.  He is most interested in radiation therapy at this point in time which is most reasonable.  Case was also discussed with Dr. Baruch Gouty today who believes this is likely the best option as well.  Plan for radiation oncology consult.  Patient was given educational material today including a book "100 questions" regarding prostate cancer and pointed to additional resources.  - degarelix (FIRMAGON) injection 240 mg - Ambulatory referral to Radiation Oncology  2. Right inguinal hernia Moderately symptomatic right inguinal hernia including bladder Unclear if from radiation oncology standpoint, will be referred to have this repaired prior to or following radiation, will defer to Dr. Luvenia Heller, [place] [address], [place], [place] [address] [phone]  I spent 40 min with this patient of which greater than 50% was spent in counseling and coordination  of care with the patient.

## 2017-06-09 NOTE — Telephone Encounter (Signed)
Pt was seen this morning by Dr. Erlene Quan and they discussed starting injections..Pt called and would like to start injections today.  Please give him a call.

## 2017-06-09 NOTE — Patient Instructions (Signed)
Discussed importance of bone health on ADT, recommend 1000-1200 mg daily calcium suppliment and [phone] IU vit D daily.  Also encouraged weight being exercises and cardiovascular health.   Leuprolide injection What is this medicine? LEUPROLIDE (loo PROE lide) is a man-made hormone. It is used to treat the symptoms of prostate cancer. This medicine may also be used to treat children with early onset of puberty. It may be used for other hormonal conditions. This medicine may be used for other purposes; ask your health care provider or pharmacist if you have questions. COMMON BRAND NAME(S): Lupron What should I tell my health care provider before I take this medicine? They need to know if you have any of these conditions: -diabetes -heart disease or previous heart attack -high blood pressure -high cholesterol -pain or difficulty passing urine -spinal cord metastasis -stroke -tobacco smoker -an unusual or allergic reaction to leuprolide, benzyl alcohol, other medicines, foods, dyes, or preservatives -pregnant or trying to get pregnant -breast-feeding How should I use this medicine? This medicine is for injection under the skin or into a muscle. You will be taught how to prepare and give this medicine. Use exactly as directed. Take your medicine at regular intervals. Do not take your medicine more often than directed. It is important that you put your used needles and syringes in a special sharps container. Do not put them in a trash can. If you do not have a sharps container, call your pharmacist or healthcare provider to get one. A special MedGuide will be given to you by the pharmacist with each prescription and refill. Be sure to read this information carefully each time. Talk to your pediatrician regarding the use of this medicine in children. While this medicine may be prescribed for children as young as 8 years for selected conditions, precautions do apply. Overdosage: If you think you  have taken too much of this medicine contact a poison control center or emergency room at once. NOTE: This medicine is only for you. Do not share this medicine with others. What if I miss a dose? If you miss a dose, take it as soon as you can. If it is almost time for your next dose, take only that dose. Do not take double or extra doses. What may interact with this medicine? Do not take this medicine with any of the following medications: -chasteberry This medicine may also interact with the following medications: -herbal or dietary supplements, like black cohosh or DHEA -male hormones, like estrogens or progestins and birth control pills, patches, rings, or injections -male hormones, like testosterone This list may not describe all possible interactions. Give your health care provider a list of all the medicines, herbs, non-prescription drugs, or dietary supplements you use. Also tell them if you smoke, drink alcohol, or use illegal drugs. Some items may interact with your medicine. What should I watch for while using this medicine? Visit your doctor or health care professional for regular checks on your progress. During the first week, your symptoms may get worse, but then will improve as you continue your treatment. You may get hot flashes, increased bone pain, increased difficulty passing urine, or an aggravation of nerve symptoms. Discuss these effects with your doctor or health care professional, some of them may improve with continued use of this medicine. Male patients may experience a menstrual cycle or spotting during the first 2 months of therapy with this medicine. If this continues, contact your doctor or health care professional. What side effects may  I notice from receiving this medicine? Side effects that you should report to your doctor or health care professional as soon as possible: -allergic reactions like skin rash, itching or hives, swelling of the face, lips, or  tongue -breathing problems -chest pain -depression or memory disorders -pain in your legs or groin -pain at site where injected -severe headache -swelling of the feet and legs -visual changes -vomiting Side effects that usually do not require medical attention (report to your doctor or health care professional if they continue or are bothersome): -breast swelling or tenderness -decrease in sex drive or performance -diarrhea -hot flashes -loss of appetite -muscle, joint, or bone pains -nausea -redness or irritation at site where injected -skin problems or acne This list may not describe all possible side effects. Call your doctor for medical advice about side effects. You may report side effects to FDA at 1-800-FDA-1088. Where should I keep my medicine? Keep out of the reach of children. Store below 25 degrees C (77 degrees F). Do not freeze. Protect from light. Do not use if it is not clear or if there are particles present. Throw away any unused medicine after the expiration date. NOTE: This sheet is a summary. It may not cover all possible information. If you have questions about this medicine, talk to your doctor, pharmacist, or health care provider.  2018 Elsevier/Gold Standard (2015-09-08 10:54:35) being exercises and cardiovascular health.

## 2017-06-10 ENCOUNTER — Telehealth: Payer: Self-pay

## 2017-06-10 NOTE — Telephone Encounter (Signed)
Spoke with patient and explained that he did receive a Firmagon loading dose yesterday and would be switched to Lupron later on in treatment for ADT. Patient verbalized underdstanding

## 2017-06-13 ENCOUNTER — Telehealth: Payer: Self-pay | Admitting: Radiology

## 2017-06-13 NOTE — Telephone Encounter (Signed)
-----   Message from [name], MD sent at 06/12/2017  8:27 PM EDT ----- Regarding: lupron Patient will be due for 6 mo lupron in 4 weeks.  Please schedule f/u with me   [name], MD

## 2017-06-13 NOTE — Telephone Encounter (Signed)
Appt made.  Pt aware.

## 2017-06-16 ENCOUNTER — Encounter: Payer: Self-pay | Admitting: Radiation Oncology

## 2017-06-16 ENCOUNTER — Other Ambulatory Visit: Payer: Self-pay | Admitting: *Deleted

## 2017-06-16 ENCOUNTER — Other Ambulatory Visit: Payer: Self-pay

## 2017-06-16 ENCOUNTER — Encounter: Payer: Self-pay | Admitting: Urology

## 2017-06-16 ENCOUNTER — Ambulatory Visit
Admission: RE | Admit: 2017-06-16 | Discharge: 2017-06-16 | Disposition: A | Discharge status: Home / Self Care | Payer: PPO | Source: Ambulatory Visit | Attending: Radiation Oncology | Admitting: Radiation Oncology

## 2017-06-16 VITALS — BP 140/81 | HR 67 | Temp 95.9°F | Resp 18 | Ht 72.0 in | Wt 181.5 lb

## 2017-06-16 DIAGNOSIS — C61 Malignant neoplasm of prostate: Secondary | ICD-10-CM

## 2017-06-16 NOTE — Consult Note (Signed)
NEW PATIENT EVALUATION  Name: Leonard Stokes  MRN: [id number]  Date:   06/16/2017     DOB: 03-25-1945   This 72 y.o. 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 Prostate Cancer    Pt is here for initial consultation of prostate cancer.      DIAGNOSIS: The encounter diagnosis was Malignant neoplasm of prostate ([place]).   PREVIOUS INVESTIGATIONS:  CT scan and bone scan reviewed axumin PET scan ordered Clinical notes reviewed Pathology report reviewed  HPI: patient is a 72 year old male excellent general condition followed for history of recurrent nephrolithiasis. Recently had a PSA up to 4.8 and on rectal examination had a palpable nodule at the right base. He underwent transrectal ultrasound-guided biopsy April 23 showing 6 out of 12 biopsies positive for adenocarcinomamostly Gleason 8 (4+4) although there was Gleason 9 (4+5). He had a prostate volume of 80 g.patient had a CT scan showing significant external lymphadenopathy bilaterally measuring up to 3 cm on the left and 1.8 cm on the right. This was not appreciated on prior scan back in July 2018. Bone scan was negative for any evidence of metastatic disease to bone. Patient does have a palpable and significant right inguinal hernia. Patient has been started on ADT therapy. I been asked to evaluate him for possible radiation therapy. Patient does have minor lower urinary tract symptoms such as frequency and urgency nocturia 1-2.  PLANNED TREATMENT REGIMEN: I MRT radiation therapy to prostate and pelvic nodes after PET CT scan for complete staging and treatment planning purposes  PAST MEDICAL HISTORY:  has a past medical history of Arthritis, Atherosclerosis of aorta ([place]) (2018), BPH (benign prostatic hyperplasia),  Cancer ([place]), Chronic kidney disease (08/2016), Diverticulosis of sigmoid colon, and Leg cramps.    PAST SURGICAL HISTORY:  Past Surgical History:  Procedure Laterality Date  . APPENDECTOMY    . EXTRACORPOREAL SHOCK WAVE LITHOTRIPSY Right 08/19/2016   Procedure: EXTRACORPOREAL SHOCK WAVE LITHOTRIPSY (ESWL);  Surgeon: [name], MD;  Location: [facility] ORS;  Service: Urology;  Laterality: Right;  . EXTRACORPOREAL SHOCK WAVE LITHOTRIPSY Left 03/24/2017   Procedure: EXTRACORPOREAL SHOCK WAVE LITHOTRIPSY (ESWL);  Surgeon: [name], MD;  Location: [facility] ORS;  Service: Urology;  Laterality: Left;  . EYE MUSCLE SURGERY    . KNEE ARTHROPLASTY Left 11/15/2016   Procedure: COMPUTER ASSISTED TOTAL KNEE ARTHROPLASTY;  Surgeon: Dereck Leep, MD;  Location: [facility] ORS;  Service: Orthopedics;  Laterality: Left;  . TOTAL KNEE ARTHROPLASTY Right     FAMILY HISTORY: family history is not on file.  SOCIAL HISTORY:  reports that he quit smoking about 21 years ago. He has never used smokeless tobacco. He reports that he drinks alcohol. He reports that he does not use drugs.  ALLERGIES: Codeine  MEDICATIONS:  Current Outpatient Medications  Medication Sig Dispense Refill  . acetaminophen (TYLENOL) 500 MG tablet Take 1,000 mg by mouth every 6 (six) hours as needed.     . Ascorbic Acid (VITAMIN C) 1000 MG tablet Take 1,000 mg by mouth daily.    . B Complex Vitamins (B COMPLEX-B12 PO) Take 1 tablet by mouth daily.    . Cholecalciferol (VITAMIN D3) 5000 units CAPS Take 5,000 Units by mouth daily.    . COD LIVER OIL PO Take 2 capsules by mouth daily.    Marland Kitchen  losartan-hydrochlorothiazide (HYZAAR) 50-12.5 MG tablet Take 1 tablet by mouth daily.    Marland Kitchen MAGNESIUM PO Take 140 mg by mouth daily.    . meloxicam (MOBIC) 15 MG tablet Take 15 mg by mouth daily.     . Misc Natural Products (OSTEO BI-FLEX TRIPLE STRENGTH PO) Take 2 tablets by mouth daily.    . Multiple Vitamins-Minerals (SENIOR MULTIVITAMIN PLUS PO)  Take 1 tablet by mouth daily.    . sildenafil (REVATIO) 20 MG tablet Take 20 mg by mouth daily as needed (for ED).    Marland Kitchen tamsulosin (FLOMAX) 0.4 MG CAPS capsule Take 1 capsule (0.4 mg total) by mouth daily. 30 capsule 0  . TURMERIC CURCUMIN PO Take 2,000 mg by mouth daily.     Marland Kitchen zolpidem (AMBIEN) 10 MG tablet Take 5 mg by mouth at bedtime as needed for sleep    . Misc Natural Products (CAYENNE-GARLIC PO) Take 1 capsule by mouth daily.    Marland Kitchen OVER THE COUNTER MEDICATION Take 2 tablets by mouth daily. OTC Cramp Defense     No current facility-administered medications for this encounter.     ECOG PERFORMANCE STATUS:  0 - Asymptomatic  REVIEW OF SYSTEMS:  Patient denies any weight loss, fatigue, weakness, fever, chills or night sweats. Patient denies any loss of vision, blurred vision. Patient denies any ringing  of the ears or hearing loss. No irregular heartbeat. Patient denies heart murmur or history of fainting. Patient denies any chest pain or pain radiating to her upper extremities. Patient denies any shortness of breath, difficulty breathing at night, cough or hemoptysis. Patient denies any swelling in the lower legs. Patient denies any nausea vomiting, vomiting of blood, or coffee ground material in the vomitus. Patient denies any stomach pain. Patient states has had normal bowel movements no significant constipation or diarrhea. Patient denies any dysuria, hematuria or significant nocturia. Patient denies any problems walking, swelling in the joints or loss of balance. Patient denies any skin changes, loss of hair or loss of weight. Patient denies any excessive worrying or anxiety or significant depression. Patient denies any problems with insomnia. Patient denies excessive thirst, polyuria, polydipsia. Patient denies any swollen glands, patient denies easy bruising or easy bleeding. Patient denies any recent infections, allergies or URI. Patient "s visual fields have not changed significantly in  recent time.    PHYSICAL EXAM: BP 140/81   Pulse 67   Temp (!) 95.9 F (35.5 C)   Resp 18   Ht 6' (1.829 m)   Wt 181 lb 8.8 oz (82.3 kg)   BMI 24.62 kg/m  On rectal exam rectal sphincter tone is good he does have a palpable nodule in the right base. Sulcus is preserved bilaterally no other nodularity is identified no other rectal abnormalities noted. Well-developed well-nourished patient in NAD. HEENT reveals PERLA, EOMI, discs not visualized.  Oral cavity is clear. No oral mucosal lesions are identified. Neck is clear without evidence of cervical or supraclavicular adenopathy. Lungs are clear to A&P. Cardiac examination is essentially unremarkable with regular rate and rhythm without murmur rub or thrill. Abdomen is benign with no organomegaly or masses noted. Motor sensory and DTR levels are equal and symmetric in the upper and lower extremities. Cranial nerves II through XII are grossly intact. Proprioception is intact. No peripheral adenopathy or edema is identified. No motor or sensory levels are noted. Crude visual fields are within normal range.  LABORATORY DATA: pathology reports reviewed    RADIOLOGY RESULTS:CT scan bone scan reviewed  PET CT scan ordered   IMPRESSION: locally advanced adenocarcinoma the prostate Gleason 9 with pelvic adenopathy in 72 year old male  PLAN: at this time of ordered andaxumin PET/CT scan for complete staging and to help with my treatment planning. I still think patient would benefit from radiation therapy using IM RT dose painting technique. With take the nodes which are enlarged up to 6000 cGy treating the rest the pelvic nodes to5400 cGy and treat the prostate to 8000 cGyusing I MRT dose painting technique. Risks and benefits of treatment including increased lower urinary tract symptoms diarrhea fatigue alteration of blood counts skin reaction and possible increased elect erectile dysfunction all were discussed with the patient and his family. All  questions were answered. I have scheduled a PET CT scan and will schedule simulation shortly thereafter. Patient and his family, per had my treatment plan well.  I would like to take this opportunity to thank you for allowing me to participate in the care of your patient.Noreene Filbert, MD

## 2017-06-23 ENCOUNTER — Encounter
Admission: RE | Admit: 2017-06-23 | Discharge: 2017-06-23 | Disposition: A | Discharge status: Home / Self Care | Payer: PPO | Source: Ambulatory Visit | Attending: Radiation Oncology | Admitting: Radiation Oncology

## 2017-06-23 DIAGNOSIS — C61 Malignant neoplasm of prostate: Secondary | ICD-10-CM | POA: Insufficient documentation

## 2017-06-23 MED ORDER — AXUMIN (FLUCICLOVINE F 18) INJECTION
10.0000 | Freq: Once | INTRAVENOUS | Status: AC
  Administered 2017-06-23: 10.5 via INTRAVENOUS

## 2017-06-28 ENCOUNTER — Ambulatory Visit
Admission: RE | Admit: 2017-06-28 | Discharge: 2017-06-28 | Disposition: A | Discharge status: Home / Self Care | Payer: PPO | Source: Ambulatory Visit | Attending: Radiation Oncology | Admitting: Radiation Oncology

## 2017-06-28 DIAGNOSIS — C61 Malignant neoplasm of prostate: Secondary | ICD-10-CM | POA: Diagnosis not present

## 2017-07-01 ENCOUNTER — Other Ambulatory Visit: Payer: Self-pay | Admitting: *Deleted

## 2017-07-01 DIAGNOSIS — C61 Malignant neoplasm of prostate: Secondary | ICD-10-CM

## 2017-07-04 DIAGNOSIS — Z87442 Personal history of urinary calculi: Secondary | ICD-10-CM | POA: Diagnosis not present

## 2017-07-04 DIAGNOSIS — Z510 Encounter for antineoplastic radiation therapy: Secondary | ICD-10-CM | POA: Diagnosis not present

## 2017-07-04 DIAGNOSIS — I700 Atherosclerosis of aorta: Secondary | ICD-10-CM | POA: Diagnosis not present

## 2017-07-04 DIAGNOSIS — C61 Malignant neoplasm of prostate: Secondary | ICD-10-CM | POA: Diagnosis not present

## 2017-07-07 ENCOUNTER — Ambulatory Visit
Admission: RE | Admit: 2017-07-07 | Discharge: 2017-07-07 | Disposition: A | Discharge status: Home / Self Care | Payer: PPO | Source: Ambulatory Visit | Attending: Radiation Oncology | Admitting: Radiation Oncology

## 2017-07-11 ENCOUNTER — Ambulatory Visit
Admission: RE | Admit: 2017-07-11 | Discharge: 2017-07-11 | Disposition: A | Discharge status: Home / Self Care | Payer: PPO | Source: Ambulatory Visit | Attending: Radiation Oncology | Admitting: Radiation Oncology

## 2017-07-11 DIAGNOSIS — C61 Malignant neoplasm of prostate: Secondary | ICD-10-CM | POA: Diagnosis not present

## 2017-07-11 DIAGNOSIS — Z510 Encounter for antineoplastic radiation therapy: Secondary | ICD-10-CM | POA: Diagnosis not present

## 2017-07-12 ENCOUNTER — Ambulatory Visit
Admission: RE | Admit: 2017-07-12 | Discharge: 2017-07-12 | Disposition: A | Discharge status: Home / Self Care | Payer: PPO | Source: Ambulatory Visit | Attending: Radiation Oncology | Admitting: Radiation Oncology

## 2017-07-12 DIAGNOSIS — Z510 Encounter for antineoplastic radiation therapy: Secondary | ICD-10-CM | POA: Diagnosis not present

## 2017-07-12 DIAGNOSIS — C61 Malignant neoplasm of prostate: Secondary | ICD-10-CM | POA: Diagnosis not present

## 2017-07-13 ENCOUNTER — Ambulatory Visit
Admission: RE | Admit: 2017-07-13 | Discharge: 2017-07-13 | Disposition: A | Discharge status: Home / Self Care | Payer: PPO | Source: Ambulatory Visit | Attending: Radiation Oncology | Admitting: Radiation Oncology

## 2017-07-13 DIAGNOSIS — C61 Malignant neoplasm of prostate: Secondary | ICD-10-CM | POA: Diagnosis not present

## 2017-07-13 DIAGNOSIS — Z510 Encounter for antineoplastic radiation therapy: Secondary | ICD-10-CM | POA: Diagnosis not present

## 2017-07-14 ENCOUNTER — Ambulatory Visit
Admission: RE | Admit: 2017-07-14 | Discharge: 2017-07-14 | Disposition: A | Discharge status: Home / Self Care | Payer: PPO | Source: Ambulatory Visit | Attending: Radiation Oncology | Admitting: Radiation Oncology

## 2017-07-14 ENCOUNTER — Encounter: Payer: Self-pay | Admitting: Urology

## 2017-07-14 ENCOUNTER — Ambulatory Visit (INDEPENDENT_AMBULATORY_CARE_PROVIDER_SITE_OTHER): Payer: PPO | Admitting: Urology

## 2017-07-14 VITALS — BP 138/71 | HR 84 | Ht 72.0 in | Wt 175.0 lb

## 2017-07-14 DIAGNOSIS — K4090 Unilateral inguinal hernia, without obstruction or gangrene, not specified as recurrent: Secondary | ICD-10-CM

## 2017-07-14 DIAGNOSIS — Z510 Encounter for antineoplastic radiation therapy: Secondary | ICD-10-CM | POA: Diagnosis not present

## 2017-07-14 DIAGNOSIS — C61 Malignant neoplasm of prostate: Secondary | ICD-10-CM

## 2017-07-14 NOTE — Progress Notes (Signed)
07/14/2017 9:49 AM   Leonard Stokes 1945/07/02 [id number]  Referring provider: Derinda Late, MD [phone] [address] [facility] [place], [place] [address]  Chief Complaint  Patient presents with  . Prostate Cancer    4wk    HPI: 72 year old male with history of recurrent nephrolithiasis and high risk prostate cancer now on Lupron and starting EBRT.  He was seen for evaluation of rising PSA up to 4.84.  He was also found to have a palpable nodule at the right base, T2b  He underwent prostate biopsy on 05/24/2017.  TRUS volume 82g. Prostate biopsy was consistent with high risk prostate cancer, Gleason 4+5 and 4+4 involving all cores on the right up to 96% of the tissue.  No left-sided involvement.  CT abdomen pelvis which revealed significant external lymphadenopathy bilaterally measuring up to 3 cm on the left and 1.8 cm in largest diameter on the right.  This is not appreciated on the scan in 08/2016.  Bone scan was negative for any evidence of osseous mets.  He was started on Degeralix with plans to start EBRT.  Prior to initiating EBRT, he did have a fluciclovine 4 PET scan on 06/23/2017 which showed no evidence of nodal or distant metastasis.    He had several radiation treatments so far and is doing well with these.  He denies any significant urinary symptoms.  Minimal bowel symptoms.  He denies any issues with ADT.  No hot flashes.  Energy level is fine.  He has started calcium and vitamin D supplementation.  He does have a palpable and significant right inguinal hernia.    He wonders if he can have this repaired later on this fall.   PMH: Past Medical History:  Diagnosis Date  . Arthritis   . Atherosclerosis of aorta ([place]) 2018  . BPH (benign prostatic hyperplasia)   . Cancer ([place])   . Chronic kidney disease 08/2016   kidney stone  . Diverticulosis of sigmoid colon   . Leg cramps     Surgical History: Past Surgical History:    Procedure Laterality Date  . APPENDECTOMY    . EXTRACORPOREAL SHOCK WAVE LITHOTRIPSY Right 08/19/2016   Procedure: EXTRACORPOREAL SHOCK WAVE LITHOTRIPSY (ESWL);  Surgeon: [name], MD;  Location: [facility] ORS;  Service: Urology;  Laterality: Right;  . EXTRACORPOREAL SHOCK WAVE LITHOTRIPSY Left 03/24/2017   Procedure: EXTRACORPOREAL SHOCK WAVE LITHOTRIPSY (ESWL);  Surgeon: [name], MD;  Location: [facility] ORS;  Service: Urology;  Laterality: Left;  . EYE MUSCLE SURGERY    . KNEE ARTHROPLASTY Left 11/15/2016   Procedure: COMPUTER ASSISTED TOTAL KNEE ARTHROPLASTY;  Surgeon: Dereck Leep, MD;  Location: [facility] ORS;  Service: Orthopedics;  Laterality: Left;  . TOTAL KNEE ARTHROPLASTY Right     Home Medications:  Allergies as of 07/14/2017      Reactions   Codeine Rash      Medication List        Accurate as of 07/14/17 11:59 PM. Always use your most recent med list.          acetaminophen 500 MG tablet Commonly known as:  TYLENOL Take 1,000 mg by mouth every 6 (six) hours as needed.   B COMPLEX-B12 PO Take 1 tablet by mouth daily.   CAYENNE-GARLIC PO Take 1 capsule by mouth daily.   OSTEO BI-FLEX TRIPLE STRENGTH PO Take 2 tablets by mouth daily.   COD LIVER OIL PO Take 2 capsules by mouth daily.  losartan-hydrochlorothiazide 50-12.5 MG tablet Commonly known as:  HYZAAR Take 1 tablet by mouth daily.   MAGNESIUM PO Take 140 mg by mouth daily.   meloxicam 15 MG tablet Commonly known as:  MOBIC Take 15 mg by mouth daily.   OVER THE COUNTER MEDICATION Take 2 tablets by mouth daily. OTC Cramp Defense   SENIOR MULTIVITAMIN PLUS PO Take 1 tablet by mouth daily.   sildenafil 20 MG tablet Commonly known as:  REVATIO Take 20 mg by mouth daily as needed (for ED).   tamsulosin 0.4 MG Caps capsule Commonly known as:  FLOMAX Take 1 capsule (0.4 mg total) by mouth daily.   TURMERIC CURCUMIN PO Take 2,000 mg by mouth daily.   vitamin C 1000 MG tablet Take 1,000  mg by mouth daily.   Vitamin D3 5000 units Caps Take 5,000 Units by mouth daily.   zolpidem 10 MG tablet Commonly known as:  AMBIEN Take 5 mg by mouth at bedtime as needed for sleep       Allergies:  Allergies  Allergen Reactions  . Codeine Rash    Family History: No family history on file.  Social History:  reports that he quit smoking about 21 years ago. He has never used smokeless tobacco. He reports that he drinks alcohol. He reports that he does not use drugs.  ROS: UROLOGY Frequent Urination?: Yes Hard to postpone urination?: No Burning/pain with urination?: No Get up at night to urinate?: Yes Leakage of urine?: No Urine stream starts and stops?: No Trouble starting stream?: No Do you have to strain to urinate?: No Blood in urine?: No Urinary tract infection?: No Sexually transmitted disease?: No Injury to kidneys or bladder?: No Painful intercourse?: No Weak stream?: No Erection problems?: Yes Penile pain?: No  Gastrointestinal Nausea?: No Vomiting?: No Indigestion/heartburn?: No Diarrhea?: No Constipation?: No  Constitutional Fever: No Night sweats?: No Weight loss?: No Fatigue?: No  Skin Skin rash/lesions?: No Itching?: No  Eyes Blurred vision?: No Double vision?: No  Ears/Nose/Throat Sore throat?: No Sinus problems?: No  Hematologic/Lymphatic Swollen glands?: No Easy bruising?: No  Cardiovascular Leg swelling?: No Chest pain?: No  Respiratory Cough?: No Shortness of breath?: No  Endocrine Excessive thirst?: No  Musculoskeletal Back pain?: No Joint pain?: No  Neurological Headaches?: No Dizziness?: No  Psychologic Depression?: No Anxiety?: No  Physical Exam: BP 138/71   Pulse 84   Ht 6' (1.829 m)   Wt 175 lb (79.4 kg)   BMI 23.73 kg/m   Constitutional:  Alert and oriented, No acute distress.  Accompanied by wife today. HEENT: [place] AT, moist mucus membranes.  Trachea midline, no masses. Cardiovascular: No  clubbing, cyanosis, or edema. Respiratory: Normal respiratory effort, no increased work of breathing. Neurologic: Grossly intact, no focal deficits, moving all 4 extremities. Psychiatric: Normal mood and affect.  Laboratory Data: Lab Results  Component Value Date   WBC 9.1 11/17/2016   HGB 11.9 (L) 11/17/2016   HCT 33.7 (L) 11/17/2016   MCV 96.4 11/17/2016   PLT 255 11/17/2016    Lab Results  Component Value Date   CREATININE 1.00 06/03/2017   Urinalysis N/a  Pertinent Imaging: Axumin PET compared to Ct abd/ pelvis with contrast on   The lymphadenopathy on the previous CT scan was not appreciated on PET scan.  He has been started on ADT for about a month prior to the study.  Assessment & Plan:    1. Prostate cancer ([place]) High risk prostate cancer, currently undergoing EBRT  Lupron due today, will initiate 6 month depo today Continue calcium vitamin D supplementation Plan for ADT x2-3 years for high risk Ca  2. Right inguinal hernia Symptomatic, okay for repair after radiation complete   Return in about 6 months (around 01/13/2018) for Lupron, PSA.  [name], MD  [facility] [address], [place], [place] [address] [phone]

## 2017-07-15 ENCOUNTER — Ambulatory Visit
Admission: RE | Admit: 2017-07-15 | Discharge: 2017-07-15 | Disposition: A | Discharge status: Home / Self Care | Payer: PPO | Source: Ambulatory Visit | Attending: Radiation Oncology | Admitting: Radiation Oncology

## 2017-07-15 DIAGNOSIS — C61 Malignant neoplasm of prostate: Secondary | ICD-10-CM | POA: Diagnosis not present

## 2017-07-15 DIAGNOSIS — Z510 Encounter for antineoplastic radiation therapy: Secondary | ICD-10-CM | POA: Diagnosis not present

## 2017-07-18 ENCOUNTER — Ambulatory Visit
Admission: RE | Admit: 2017-07-18 | Discharge: 2017-07-18 | Disposition: A | Discharge status: Home / Self Care | Payer: PPO | Source: Ambulatory Visit | Attending: Radiation Oncology | Admitting: Radiation Oncology

## 2017-07-18 ENCOUNTER — Ambulatory Visit (INDEPENDENT_AMBULATORY_CARE_PROVIDER_SITE_OTHER): Payer: PPO

## 2017-07-18 DIAGNOSIS — Z510 Encounter for antineoplastic radiation therapy: Secondary | ICD-10-CM | POA: Diagnosis not present

## 2017-07-18 DIAGNOSIS — C61 Malignant neoplasm of prostate: Secondary | ICD-10-CM

## 2017-07-18 MED ORDER — LEUPROLIDE ACETATE (6 MONTH) 45 MG IM KIT
45.0000 mg | PACK | Freq: Once | INTRAMUSCULAR | Status: AC
  Administered 2017-07-18: 45 mg via INTRAMUSCULAR

## 2017-07-18 NOTE — Progress Notes (Signed)
Lupron IM Injection   Due to Prostate Cancer patient is present today for a Lupron Injection.  Medication: Lupron 6 month Dose: 45 mg  Location: left upper outer buttocks Lot: [id number] Exp: 10/27/2019  Patient tolerated well, no complications were noted  Performed by: Fonnie Jarvis, CMA  Follow up: 6 month

## 2017-07-19 ENCOUNTER — Ambulatory Visit
Admission: RE | Admit: 2017-07-19 | Discharge: 2017-07-19 | Disposition: A | Discharge status: Home / Self Care | Payer: PPO | Source: Ambulatory Visit | Attending: Radiation Oncology | Admitting: Radiation Oncology

## 2017-07-19 DIAGNOSIS — Z510 Encounter for antineoplastic radiation therapy: Secondary | ICD-10-CM | POA: Diagnosis not present

## 2017-07-19 DIAGNOSIS — C61 Malignant neoplasm of prostate: Secondary | ICD-10-CM | POA: Diagnosis not present

## 2017-07-20 ENCOUNTER — Ambulatory Visit
Admission: RE | Admit: 2017-07-20 | Discharge: 2017-07-20 | Disposition: A | Discharge status: Home / Self Care | Payer: PPO | Source: Ambulatory Visit | Attending: Radiation Oncology | Admitting: Radiation Oncology

## 2017-07-20 DIAGNOSIS — Z510 Encounter for antineoplastic radiation therapy: Secondary | ICD-10-CM | POA: Diagnosis not present

## 2017-07-20 DIAGNOSIS — C61 Malignant neoplasm of prostate: Secondary | ICD-10-CM | POA: Diagnosis not present

## 2017-07-21 ENCOUNTER — Ambulatory Visit
Admission: RE | Admit: 2017-07-21 | Discharge: 2017-07-21 | Disposition: A | Discharge status: Home / Self Care | Payer: PPO | Source: Ambulatory Visit | Attending: Radiation Oncology | Admitting: Radiation Oncology

## 2017-07-21 DIAGNOSIS — C61 Malignant neoplasm of prostate: Secondary | ICD-10-CM | POA: Diagnosis not present

## 2017-07-21 DIAGNOSIS — Z510 Encounter for antineoplastic radiation therapy: Secondary | ICD-10-CM | POA: Diagnosis not present

## 2017-07-22 ENCOUNTER — Ambulatory Visit
Admission: RE | Admit: 2017-07-22 | Discharge: 2017-07-22 | Disposition: A | Discharge status: Home / Self Care | Payer: PPO | Source: Ambulatory Visit | Attending: Radiation Oncology | Admitting: Radiation Oncology

## 2017-07-22 DIAGNOSIS — C61 Malignant neoplasm of prostate: Secondary | ICD-10-CM | POA: Diagnosis not present

## 2017-07-22 DIAGNOSIS — Z510 Encounter for antineoplastic radiation therapy: Secondary | ICD-10-CM | POA: Diagnosis not present

## 2017-07-25 ENCOUNTER — Other Ambulatory Visit: Payer: Self-pay

## 2017-07-25 ENCOUNTER — Ambulatory Visit
Admission: RE | Admit: 2017-07-25 | Discharge: 2017-07-25 | Disposition: A | Discharge status: Home / Self Care | Payer: PPO | Source: Ambulatory Visit | Attending: Radiation Oncology | Admitting: Radiation Oncology

## 2017-07-25 ENCOUNTER — Inpatient Hospital Stay: Payer: PPO | Attending: Radiation Oncology

## 2017-07-25 DIAGNOSIS — C61 Malignant neoplasm of prostate: Secondary | ICD-10-CM

## 2017-07-25 DIAGNOSIS — Z510 Encounter for antineoplastic radiation therapy: Secondary | ICD-10-CM | POA: Diagnosis not present

## 2017-07-25 LAB — CBC
HCT: 36.1 % — ABNORMAL LOW (ref 40.0–52.0)
Hemoglobin: 12.7 g/dL — ABNORMAL LOW (ref 13.0–18.0)
MCH: 33 pg (ref 26.0–34.0)
MCHC: 35.1 g/dL (ref 32.0–36.0)
MCV: 94.2 fL (ref 80.0–100.0)
PLATELETS: 174 10*3/uL (ref 150–440)
RBC: 3.84 MIL/uL — ABNORMAL LOW (ref 4.40–5.90)
RDW: 13.3 % (ref 11.5–14.5)
WBC: 3.6 10*3/uL — AB (ref 3.8–10.6)

## 2017-07-26 ENCOUNTER — Ambulatory Visit
Admission: RE | Admit: 2017-07-26 | Discharge: 2017-07-26 | Disposition: A | Discharge status: Home / Self Care | Payer: PPO | Source: Ambulatory Visit | Attending: Radiation Oncology | Admitting: Radiation Oncology

## 2017-07-26 DIAGNOSIS — C61 Malignant neoplasm of prostate: Secondary | ICD-10-CM | POA: Diagnosis not present

## 2017-07-26 DIAGNOSIS — Z510 Encounter for antineoplastic radiation therapy: Secondary | ICD-10-CM | POA: Diagnosis not present

## 2017-07-27 ENCOUNTER — Ambulatory Visit
Admission: RE | Admit: 2017-07-27 | Discharge: 2017-07-27 | Disposition: A | Discharge status: Home / Self Care | Payer: PPO | Source: Ambulatory Visit | Attending: Radiation Oncology | Admitting: Radiation Oncology

## 2017-07-27 DIAGNOSIS — Z510 Encounter for antineoplastic radiation therapy: Secondary | ICD-10-CM | POA: Diagnosis not present

## 2017-07-27 DIAGNOSIS — C61 Malignant neoplasm of prostate: Secondary | ICD-10-CM | POA: Diagnosis not present

## 2017-07-28 ENCOUNTER — Ambulatory Visit
Admission: RE | Admit: 2017-07-28 | Discharge: 2017-07-28 | Disposition: A | Discharge status: Home / Self Care | Payer: PPO | Source: Ambulatory Visit | Attending: Radiation Oncology | Admitting: Radiation Oncology

## 2017-07-28 DIAGNOSIS — C61 Malignant neoplasm of prostate: Secondary | ICD-10-CM | POA: Diagnosis not present

## 2017-07-28 DIAGNOSIS — Z510 Encounter for antineoplastic radiation therapy: Secondary | ICD-10-CM | POA: Diagnosis not present

## 2017-07-29 ENCOUNTER — Ambulatory Visit
Admission: RE | Admit: 2017-07-29 | Discharge: 2017-07-29 | Disposition: A | Discharge status: Home / Self Care | Payer: PPO | Source: Ambulatory Visit | Attending: Radiation Oncology | Admitting: Radiation Oncology

## 2017-07-29 DIAGNOSIS — C61 Malignant neoplasm of prostate: Secondary | ICD-10-CM | POA: Diagnosis not present

## 2017-07-29 DIAGNOSIS — Z510 Encounter for antineoplastic radiation therapy: Secondary | ICD-10-CM | POA: Diagnosis not present

## 2017-08-01 ENCOUNTER — Ambulatory Visit
Admission: RE | Admit: 2017-08-01 | Discharge: 2017-08-01 | Disposition: A | Discharge status: Home / Self Care | Payer: PPO | Source: Ambulatory Visit | Attending: Radiation Oncology | Admitting: Radiation Oncology

## 2017-08-01 DIAGNOSIS — C61 Malignant neoplasm of prostate: Secondary | ICD-10-CM | POA: Diagnosis not present

## 2017-08-01 DIAGNOSIS — Z510 Encounter for antineoplastic radiation therapy: Secondary | ICD-10-CM | POA: Insufficient documentation

## 2017-08-02 ENCOUNTER — Ambulatory Visit
Admission: RE | Admit: 2017-08-02 | Discharge: 2017-08-02 | Disposition: A | Discharge status: Home / Self Care | Payer: PPO | Source: Ambulatory Visit | Attending: Radiation Oncology | Admitting: Radiation Oncology

## 2017-08-02 DIAGNOSIS — C61 Malignant neoplasm of prostate: Secondary | ICD-10-CM | POA: Diagnosis not present

## 2017-08-02 DIAGNOSIS — Z510 Encounter for antineoplastic radiation therapy: Secondary | ICD-10-CM | POA: Diagnosis not present

## 2017-08-03 ENCOUNTER — Ambulatory Visit
Admission: RE | Admit: 2017-08-03 | Discharge: 2017-08-03 | Disposition: A | Discharge status: Home / Self Care | Payer: PPO | Source: Ambulatory Visit | Attending: Radiation Oncology | Admitting: Radiation Oncology

## 2017-08-03 DIAGNOSIS — C61 Malignant neoplasm of prostate: Secondary | ICD-10-CM | POA: Diagnosis not present

## 2017-08-03 DIAGNOSIS — Z510 Encounter for antineoplastic radiation therapy: Secondary | ICD-10-CM | POA: Diagnosis not present

## 2017-08-05 ENCOUNTER — Ambulatory Visit: Payer: PPO

## 2017-08-08 ENCOUNTER — Ambulatory Visit
Admission: RE | Admit: 2017-08-08 | Discharge: 2017-08-08 | Disposition: A | Discharge status: Home / Self Care | Payer: PPO | Source: Ambulatory Visit | Attending: Radiation Oncology | Admitting: Radiation Oncology

## 2017-08-08 ENCOUNTER — Inpatient Hospital Stay: Payer: PPO | Attending: Radiation Oncology

## 2017-08-08 DIAGNOSIS — C61 Malignant neoplasm of prostate: Secondary | ICD-10-CM | POA: Diagnosis not present

## 2017-08-08 DIAGNOSIS — Z510 Encounter for antineoplastic radiation therapy: Secondary | ICD-10-CM | POA: Diagnosis not present

## 2017-08-08 LAB — CBC
HEMATOCRIT: 34.8 % — AB (ref 40.0–52.0)
Hemoglobin: 12.3 g/dL — ABNORMAL LOW (ref 13.0–18.0)
MCH: 33.1 pg (ref 26.0–34.0)
MCHC: 35.4 g/dL (ref 32.0–36.0)
MCV: 93.5 fL (ref 80.0–100.0)
PLATELETS: 187 10*3/uL (ref 150–440)
RBC: 3.72 MIL/uL — AB (ref 4.40–5.90)
RDW: 13.4 % (ref 11.5–14.5)
WBC: 5.6 10*3/uL (ref 3.8–10.6)

## 2017-08-09 ENCOUNTER — Ambulatory Visit
Admission: RE | Admit: 2017-08-09 | Discharge: 2017-08-09 | Disposition: A | Discharge status: Home / Self Care | Payer: PPO | Source: Ambulatory Visit | Attending: Radiation Oncology | Admitting: Radiation Oncology

## 2017-08-09 DIAGNOSIS — Z510 Encounter for antineoplastic radiation therapy: Secondary | ICD-10-CM | POA: Diagnosis not present

## 2017-08-09 DIAGNOSIS — C61 Malignant neoplasm of prostate: Secondary | ICD-10-CM | POA: Diagnosis not present

## 2017-08-10 ENCOUNTER — Ambulatory Visit
Admission: RE | Admit: 2017-08-10 | Discharge: 2017-08-10 | Disposition: A | Discharge status: Home / Self Care | Payer: PPO | Source: Ambulatory Visit | Attending: Radiation Oncology | Admitting: Radiation Oncology

## 2017-08-10 DIAGNOSIS — C61 Malignant neoplasm of prostate: Secondary | ICD-10-CM | POA: Diagnosis not present

## 2017-08-10 DIAGNOSIS — Z510 Encounter for antineoplastic radiation therapy: Secondary | ICD-10-CM | POA: Diagnosis not present

## 2017-08-11 ENCOUNTER — Ambulatory Visit
Admission: RE | Admit: 2017-08-11 | Discharge: 2017-08-11 | Disposition: A | Discharge status: Home / Self Care | Payer: PPO | Source: Ambulatory Visit | Attending: Radiation Oncology | Admitting: Radiation Oncology

## 2017-08-11 DIAGNOSIS — C61 Malignant neoplasm of prostate: Secondary | ICD-10-CM | POA: Diagnosis not present

## 2017-08-11 DIAGNOSIS — Z510 Encounter for antineoplastic radiation therapy: Secondary | ICD-10-CM | POA: Diagnosis not present

## 2017-08-12 ENCOUNTER — Ambulatory Visit
Admission: RE | Admit: 2017-08-12 | Discharge: 2017-08-12 | Disposition: A | Discharge status: Home / Self Care | Payer: PPO | Source: Ambulatory Visit | Attending: Radiation Oncology | Admitting: Radiation Oncology

## 2017-08-12 DIAGNOSIS — C61 Malignant neoplasm of prostate: Secondary | ICD-10-CM | POA: Diagnosis not present

## 2017-08-12 DIAGNOSIS — Z510 Encounter for antineoplastic radiation therapy: Secondary | ICD-10-CM | POA: Diagnosis not present

## 2017-08-15 ENCOUNTER — Ambulatory Visit
Admission: RE | Admit: 2017-08-15 | Discharge: 2017-08-15 | Disposition: A | Discharge status: Home / Self Care | Payer: PPO | Source: Ambulatory Visit | Attending: Radiation Oncology | Admitting: Radiation Oncology

## 2017-08-15 DIAGNOSIS — Z510 Encounter for antineoplastic radiation therapy: Secondary | ICD-10-CM | POA: Diagnosis not present

## 2017-08-15 DIAGNOSIS — C61 Malignant neoplasm of prostate: Secondary | ICD-10-CM | POA: Diagnosis not present

## 2017-08-16 ENCOUNTER — Other Ambulatory Visit: Payer: Self-pay | Admitting: *Deleted

## 2017-08-16 ENCOUNTER — Ambulatory Visit
Admission: RE | Admit: 2017-08-16 | Discharge: 2017-08-16 | Disposition: A | Discharge status: Home / Self Care | Payer: PPO | Source: Ambulatory Visit | Attending: Radiation Oncology | Admitting: Radiation Oncology

## 2017-08-16 DIAGNOSIS — Z510 Encounter for antineoplastic radiation therapy: Secondary | ICD-10-CM | POA: Diagnosis not present

## 2017-08-16 DIAGNOSIS — C61 Malignant neoplasm of prostate: Secondary | ICD-10-CM | POA: Diagnosis not present

## 2017-08-17 ENCOUNTER — Ambulatory Visit
Admission: RE | Admit: 2017-08-17 | Discharge: 2017-08-17 | Disposition: A | Discharge status: Home / Self Care | Payer: PPO | Source: Ambulatory Visit | Attending: Radiation Oncology | Admitting: Radiation Oncology

## 2017-08-17 DIAGNOSIS — C61 Malignant neoplasm of prostate: Secondary | ICD-10-CM | POA: Diagnosis not present

## 2017-08-17 DIAGNOSIS — Z510 Encounter for antineoplastic radiation therapy: Secondary | ICD-10-CM | POA: Diagnosis not present

## 2017-08-18 ENCOUNTER — Ambulatory Visit
Admission: RE | Admit: 2017-08-18 | Discharge: 2017-08-18 | Disposition: A | Discharge status: Home / Self Care | Payer: PPO | Source: Ambulatory Visit | Attending: Radiation Oncology | Admitting: Radiation Oncology

## 2017-08-18 DIAGNOSIS — Z510 Encounter for antineoplastic radiation therapy: Secondary | ICD-10-CM | POA: Diagnosis not present

## 2017-08-18 DIAGNOSIS — C61 Malignant neoplasm of prostate: Secondary | ICD-10-CM | POA: Diagnosis not present

## 2017-08-19 ENCOUNTER — Ambulatory Visit: Payer: PPO

## 2017-08-22 ENCOUNTER — Ambulatory Visit
Admission: RE | Admit: 2017-08-22 | Discharge: 2017-08-22 | Disposition: A | Discharge status: Home / Self Care | Payer: PPO | Source: Ambulatory Visit | Attending: Radiation Oncology | Admitting: Radiation Oncology

## 2017-08-22 ENCOUNTER — Inpatient Hospital Stay: Payer: PPO

## 2017-08-22 DIAGNOSIS — C61 Malignant neoplasm of prostate: Secondary | ICD-10-CM | POA: Diagnosis not present

## 2017-08-22 DIAGNOSIS — Z510 Encounter for antineoplastic radiation therapy: Secondary | ICD-10-CM | POA: Diagnosis not present

## 2017-08-22 LAB — CBC
HEMATOCRIT: 33.6 % — AB (ref 40.0–52.0)
Hemoglobin: 11.8 g/dL — ABNORMAL LOW (ref 13.0–18.0)
MCH: 33 pg (ref 26.0–34.0)
MCHC: 35 g/dL (ref 32.0–36.0)
MCV: 94.3 fL (ref 80.0–100.0)
PLATELETS: 239 10*3/uL (ref 150–440)
RBC: 3.56 MIL/uL — ABNORMAL LOW (ref 4.40–5.90)
RDW: 14.2 % (ref 11.5–14.5)
WBC: 4.2 10*3/uL (ref 3.8–10.6)

## 2017-08-23 ENCOUNTER — Ambulatory Visit
Admission: RE | Admit: 2017-08-23 | Discharge: 2017-08-23 | Disposition: A | Discharge status: Home / Self Care | Payer: PPO | Source: Ambulatory Visit | Attending: Radiation Oncology | Admitting: Radiation Oncology

## 2017-08-23 DIAGNOSIS — C61 Malignant neoplasm of prostate: Secondary | ICD-10-CM | POA: Diagnosis not present

## 2017-08-23 DIAGNOSIS — Z510 Encounter for antineoplastic radiation therapy: Secondary | ICD-10-CM | POA: Diagnosis not present

## 2017-08-24 ENCOUNTER — Ambulatory Visit
Admission: RE | Admit: 2017-08-24 | Discharge: 2017-08-24 | Disposition: A | Discharge status: Home / Self Care | Payer: PPO | Source: Ambulatory Visit | Attending: Radiation Oncology | Admitting: Radiation Oncology

## 2017-08-24 DIAGNOSIS — Z510 Encounter for antineoplastic radiation therapy: Secondary | ICD-10-CM | POA: Diagnosis not present

## 2017-08-24 DIAGNOSIS — C61 Malignant neoplasm of prostate: Secondary | ICD-10-CM | POA: Diagnosis not present

## 2017-08-25 ENCOUNTER — Ambulatory Visit
Admission: RE | Admit: 2017-08-25 | Discharge: 2017-08-25 | Disposition: A | Discharge status: Home / Self Care | Payer: PPO | Source: Ambulatory Visit | Attending: Radiation Oncology | Admitting: Radiation Oncology

## 2017-08-25 DIAGNOSIS — Z510 Encounter for antineoplastic radiation therapy: Secondary | ICD-10-CM | POA: Diagnosis not present

## 2017-08-25 DIAGNOSIS — C61 Malignant neoplasm of prostate: Secondary | ICD-10-CM | POA: Diagnosis not present

## 2017-08-26 ENCOUNTER — Ambulatory Visit
Admission: RE | Admit: 2017-08-26 | Discharge: 2017-08-26 | Disposition: A | Discharge status: Home / Self Care | Payer: PPO | Source: Ambulatory Visit | Attending: Radiation Oncology | Admitting: Radiation Oncology

## 2017-08-26 DIAGNOSIS — Z510 Encounter for antineoplastic radiation therapy: Secondary | ICD-10-CM | POA: Diagnosis not present

## 2017-08-26 DIAGNOSIS — C61 Malignant neoplasm of prostate: Secondary | ICD-10-CM | POA: Diagnosis not present

## 2017-08-27 ENCOUNTER — Ambulatory Visit: Payer: PPO

## 2017-08-29 ENCOUNTER — Ambulatory Visit
Admission: RE | Admit: 2017-08-29 | Discharge: 2017-08-29 | Disposition: A | Discharge status: Home / Self Care | Payer: PPO | Source: Ambulatory Visit | Attending: Radiation Oncology | Admitting: Radiation Oncology

## 2017-08-29 DIAGNOSIS — C61 Malignant neoplasm of prostate: Secondary | ICD-10-CM | POA: Diagnosis not present

## 2017-08-29 DIAGNOSIS — Z510 Encounter for antineoplastic radiation therapy: Secondary | ICD-10-CM | POA: Diagnosis not present

## 2017-08-30 ENCOUNTER — Ambulatory Visit
Admission: RE | Admit: 2017-08-30 | Discharge: 2017-08-30 | Disposition: A | Discharge status: Home / Self Care | Payer: PPO | Source: Ambulatory Visit | Attending: Radiation Oncology | Admitting: Radiation Oncology

## 2017-08-30 DIAGNOSIS — C61 Malignant neoplasm of prostate: Secondary | ICD-10-CM | POA: Diagnosis not present

## 2017-08-30 DIAGNOSIS — Z510 Encounter for antineoplastic radiation therapy: Secondary | ICD-10-CM | POA: Diagnosis not present

## 2017-08-31 ENCOUNTER — Ambulatory Visit
Admission: RE | Admit: 2017-08-31 | Discharge: 2017-08-31 | Disposition: A | Discharge status: Home / Self Care | Payer: PPO | Source: Ambulatory Visit | Attending: Radiation Oncology | Admitting: Radiation Oncology

## 2017-08-31 DIAGNOSIS — Z510 Encounter for antineoplastic radiation therapy: Secondary | ICD-10-CM | POA: Diagnosis not present

## 2017-08-31 DIAGNOSIS — C61 Malignant neoplasm of prostate: Secondary | ICD-10-CM | POA: Diagnosis not present

## 2017-09-01 ENCOUNTER — Ambulatory Visit
Admission: RE | Admit: 2017-09-01 | Discharge: 2017-09-01 | Disposition: A | Discharge status: Home / Self Care | Payer: PPO | Source: Ambulatory Visit | Attending: Radiation Oncology | Admitting: Radiation Oncology

## 2017-09-01 DIAGNOSIS — Z510 Encounter for antineoplastic radiation therapy: Secondary | ICD-10-CM | POA: Diagnosis not present

## 2017-09-01 DIAGNOSIS — C61 Malignant neoplasm of prostate: Secondary | ICD-10-CM | POA: Insufficient documentation

## 2017-09-02 ENCOUNTER — Ambulatory Visit
Admission: RE | Admit: 2017-09-02 | Discharge: 2017-09-02 | Disposition: A | Discharge status: Home / Self Care | Payer: PPO | Source: Ambulatory Visit | Attending: Radiation Oncology | Admitting: Radiation Oncology

## 2017-09-02 DIAGNOSIS — C61 Malignant neoplasm of prostate: Secondary | ICD-10-CM | POA: Diagnosis not present

## 2017-09-02 DIAGNOSIS — Z510 Encounter for antineoplastic radiation therapy: Secondary | ICD-10-CM | POA: Diagnosis not present

## 2017-09-05 ENCOUNTER — Ambulatory Visit
Admission: RE | Admit: 2017-09-05 | Discharge: 2017-09-05 | Disposition: A | Discharge status: Home / Self Care | Payer: PPO | Source: Ambulatory Visit | Attending: Radiation Oncology | Admitting: Radiation Oncology

## 2017-09-05 ENCOUNTER — Ambulatory Visit: Payer: PPO

## 2017-09-05 DIAGNOSIS — C61 Malignant neoplasm of prostate: Secondary | ICD-10-CM | POA: Diagnosis not present

## 2017-09-05 DIAGNOSIS — Z510 Encounter for antineoplastic radiation therapy: Secondary | ICD-10-CM | POA: Diagnosis not present

## 2017-09-06 ENCOUNTER — Ambulatory Visit: Payer: PPO

## 2017-09-06 ENCOUNTER — Ambulatory Visit
Admission: RE | Admit: 2017-09-06 | Discharge: 2017-09-06 | Disposition: A | Discharge status: Home / Self Care | Payer: PPO | Source: Ambulatory Visit | Attending: Radiation Oncology | Admitting: Radiation Oncology

## 2017-09-06 DIAGNOSIS — C61 Malignant neoplasm of prostate: Secondary | ICD-10-CM | POA: Diagnosis not present

## 2017-09-06 DIAGNOSIS — Z510 Encounter for antineoplastic radiation therapy: Secondary | ICD-10-CM | POA: Diagnosis not present

## 2017-09-07 ENCOUNTER — Ambulatory Visit
Admission: RE | Admit: 2017-09-07 | Discharge: 2017-09-07 | Disposition: A | Discharge status: Home / Self Care | Payer: PPO | Source: Ambulatory Visit | Attending: Radiation Oncology | Admitting: Radiation Oncology

## 2017-09-07 DIAGNOSIS — C61 Malignant neoplasm of prostate: Secondary | ICD-10-CM | POA: Diagnosis not present

## 2017-09-07 DIAGNOSIS — Z510 Encounter for antineoplastic radiation therapy: Secondary | ICD-10-CM | POA: Diagnosis not present

## 2017-09-13 DIAGNOSIS — I10 Essential (primary) hypertension: Secondary | ICD-10-CM | POA: Diagnosis not present

## 2017-09-13 DIAGNOSIS — Z79899 Other long term (current) drug therapy: Secondary | ICD-10-CM | POA: Diagnosis not present

## 2017-09-13 DIAGNOSIS — R739 Hyperglycemia, unspecified: Secondary | ICD-10-CM | POA: Diagnosis not present

## 2017-09-20 DIAGNOSIS — D649 Anemia, unspecified: Secondary | ICD-10-CM | POA: Diagnosis not present

## 2017-09-20 DIAGNOSIS — R351 Nocturia: Secondary | ICD-10-CM | POA: Diagnosis not present

## 2017-09-20 DIAGNOSIS — R197 Diarrhea, unspecified: Secondary | ICD-10-CM | POA: Diagnosis not present

## 2017-09-20 DIAGNOSIS — R739 Hyperglycemia, unspecified: Secondary | ICD-10-CM | POA: Diagnosis not present

## 2017-09-20 DIAGNOSIS — N401 Enlarged prostate with lower urinary tract symptoms: Secondary | ICD-10-CM | POA: Diagnosis not present

## 2017-09-20 DIAGNOSIS — F5101 Primary insomnia: Secondary | ICD-10-CM | POA: Diagnosis not present

## 2017-09-20 DIAGNOSIS — C61 Malignant neoplasm of prostate: Secondary | ICD-10-CM | POA: Diagnosis not present

## 2017-09-20 DIAGNOSIS — I10 Essential (primary) hypertension: Secondary | ICD-10-CM | POA: Diagnosis not present

## 2017-09-20 DIAGNOSIS — Z79899 Other long term (current) drug therapy: Secondary | ICD-10-CM | POA: Diagnosis not present

## 2017-10-13 ENCOUNTER — Other Ambulatory Visit: Payer: Self-pay | Admitting: *Deleted

## 2017-10-13 ENCOUNTER — Encounter: Payer: Self-pay | Admitting: Radiation Oncology

## 2017-10-13 ENCOUNTER — Ambulatory Visit
Admission: RE | Admit: 2017-10-13 | Discharge: 2017-10-13 | Disposition: A | Discharge status: Home / Self Care | Payer: PPO | Source: Ambulatory Visit | Attending: Radiation Oncology | Admitting: Radiation Oncology

## 2017-10-13 ENCOUNTER — Other Ambulatory Visit: Payer: Self-pay

## 2017-10-13 DIAGNOSIS — N529 Male erectile dysfunction, unspecified: Secondary | ICD-10-CM | POA: Insufficient documentation

## 2017-10-13 DIAGNOSIS — Z923 Personal history of irradiation: Secondary | ICD-10-CM | POA: Insufficient documentation

## 2017-10-13 DIAGNOSIS — C61 Malignant neoplasm of prostate: Secondary | ICD-10-CM | POA: Insufficient documentation

## 2017-10-13 NOTE — Progress Notes (Signed)
Radiation Oncology Follow up Note  Name: Leonard Stokes   Date:   10/13/2017 MRN:  [id number] DOB: 1945-12-04    This 72 y.o. male presents to the clinic today for one-month follow-up status post I MRT radiation therapy for stage IV (4+5) adenocarcinoma the prostate presenting the PSA of 4.8 and pelvic lymphadenopathy.  REFERRING PROVIDER: Derinda Late, MD  HPI: patient is now 1 month out of I MRT radiation therapy for Gleason 9 adenocarcinoma the prostate with pelvic lymphadenopathy. Seen today in routine follow-up he is doing well he states his urinary flow has improved. He is haery little intermittent  His main concern is hot flashes from his Lupron therapy.Marland Kitchene does have erectile dysfunction although this predates his radiation treatments.  COMPLICATIONS OF TREATMENT: none  FOLLOW UP COMPLIANCE: keeps appointments   PHYSICAL EXAM:  BP (!) (P) 141/78 (BP Location: Left Arm, Patient Position: Sitting)   Pulse (P) 78   Temp (!) (P) 96.5 F (35.8 C) (Tympanic)   Wt (P) 182 lb 8.7 oz (82.8 kg)   BMI (P) 24.76 kg/m  Well-developed well-nourished patient in NAD. HEENT reveals PERLA, EOMI, discs not visualized.  Oral cavity is clear. No oral mucosal lesions are identified. Neck is clear without evidence of cervical or supraclavicular adenopathy. Lungs are clear to A&P. Cardiac examination is essentially unremarkable with regular rate and rhythm without murmur rub or thrill. Abdomen is benign with no organomegaly or masses noted. Motor sensory and DTR levels are equal and symmetric in the upper and lower extremities. Cranial nerves II through XII are grossly intact. Proprioception is intact. No peripheral adenopathy or edema is identified. No motor or sensory levels are noted. Crude visual fields are within normal range.  RADIOLOGY RESULTS: no current films for review  PLAN: present time patient is doing well very little side effect profile from his prior radiation therapy. Would like to keep  him suppressed on Lupron referring him back to Dr. Dene Gentry office for continuation of his Lupron therapy. I've otherwise asked to see them out in 3-4 months and will obtain a PSA at that time. Patient knows to call anytime for any concerns.  I would like to take this opportunity to thank you for allowing me to participate in the care of your patient.Noreene Filbert, MD

## 2017-10-18 DIAGNOSIS — X32XXXA Exposure to sunlight, initial encounter: Secondary | ICD-10-CM | POA: Diagnosis not present

## 2017-10-18 DIAGNOSIS — D2262 Melanocytic nevi of left upper limb, including shoulder: Secondary | ICD-10-CM | POA: Diagnosis not present

## 2017-10-18 DIAGNOSIS — D2271 Melanocytic nevi of right lower limb, including hip: Secondary | ICD-10-CM | POA: Diagnosis not present

## 2017-10-18 DIAGNOSIS — L570 Actinic keratosis: Secondary | ICD-10-CM | POA: Diagnosis not present

## 2017-10-18 DIAGNOSIS — S80861A Insect bite (nonvenomous), right lower leg, initial encounter: Secondary | ICD-10-CM | POA: Diagnosis not present

## 2017-10-18 DIAGNOSIS — Z08 Encounter for follow-up examination after completed treatment for malignant neoplasm: Secondary | ICD-10-CM | POA: Diagnosis not present

## 2017-10-18 DIAGNOSIS — S80862A Insect bite (nonvenomous), left lower leg, initial encounter: Secondary | ICD-10-CM | POA: Diagnosis not present

## 2017-10-18 DIAGNOSIS — Z85828 Personal history of other malignant neoplasm of skin: Secondary | ICD-10-CM | POA: Diagnosis not present

## 2017-10-18 DIAGNOSIS — L821 Other seborrheic keratosis: Secondary | ICD-10-CM | POA: Diagnosis not present

## 2017-11-07 DIAGNOSIS — K4090 Unilateral inguinal hernia, without obstruction or gangrene, not specified as recurrent: Secondary | ICD-10-CM | POA: Diagnosis not present

## 2017-11-07 DIAGNOSIS — M542 Cervicalgia: Secondary | ICD-10-CM | POA: Diagnosis not present

## 2017-11-15 DIAGNOSIS — Z96652 Presence of left artificial knee joint: Secondary | ICD-10-CM | POA: Diagnosis not present

## 2017-11-15 DIAGNOSIS — M25562 Pain in left knee: Secondary | ICD-10-CM | POA: Diagnosis not present

## 2017-11-15 DIAGNOSIS — C61 Malignant neoplasm of prostate: Secondary | ICD-10-CM | POA: Diagnosis not present

## 2017-11-29 DIAGNOSIS — M8588 Other specified disorders of bone density and structure, other site: Secondary | ICD-10-CM | POA: Diagnosis not present

## 2017-12-01 ENCOUNTER — Other Ambulatory Visit: Payer: Self-pay

## 2017-12-01 ENCOUNTER — Ambulatory Visit (INDEPENDENT_AMBULATORY_CARE_PROVIDER_SITE_OTHER): Payer: PPO | Admitting: General Surgery

## 2017-12-01 ENCOUNTER — Encounter: Payer: Self-pay | Admitting: General Surgery

## 2017-12-01 VITALS — BP 115/73 | HR 76 | Temp 97.2°F | Resp 11 | Ht 71.0 in | Wt 183.6 lb

## 2017-12-01 DIAGNOSIS — K4090 Unilateral inguinal hernia, without obstruction or gangrene, not specified as recurrent: Secondary | ICD-10-CM

## 2017-12-01 DIAGNOSIS — C775 Secondary and unspecified malignant neoplasm of intrapelvic lymph nodes: Secondary | ICD-10-CM | POA: Diagnosis not present

## 2017-12-01 DIAGNOSIS — C61 Malignant neoplasm of prostate: Secondary | ICD-10-CM | POA: Diagnosis not present

## 2017-12-01 NOTE — Patient Instructions (Signed)
Inguinal Hernia, Adult An inguinal hernia is when fat or the intestines push through the area where the leg meets the lower belly (groin) and make a rounded lump (bulge). This condition happens over time. There are three types of inguinal hernias. These types include:  Hernias that can be pushed back into the belly (are reducible).  Hernias that cannot be pushed back into the belly (are incarcerated).  Hernias that cannot be pushed back into the belly and lose their blood supply (get strangulated). This type needs emergency surgery.  Follow these instructions at home: Lifestyle  Drink enough fluid to keep your urine (pee) clear or pale yellow.  Eat plenty of fruits, vegetables, and whole grains. These have a lot of fiber. Talk with your doctor if you have questions.  Avoid lifting heavy objects.  Avoid standing for long periods of time.  Do not use tobacco products. These include cigarettes, chewing tobacco, or e-cigarettes. If you need help quitting, ask your doctor.  Try to stay at a healthy weight. General instructions  Do not try to force the hernia back in.  Watch your hernia for any changes in color or size. Let your doctor know if there are any changes.  Take over-the-counter and prescription medicines only as told by your doctor.  Keep all follow-up visits as told by your doctor. This is important. Contact a doctor if:  You have a fever.  You have new symptoms.  Your symptoms get worse. Get help right away if:  The area where the legs meets the lower belly has: ? Pain that gets worse suddenly. ? A bulge that gets bigger suddenly and does not go down. ? A bulge that turns red or purple. ? A bulge that is painful to the touch.  You are a man and your scrotum: ? Suddenly feels painful. ? Suddenly changes in size.  You feel sick to your stomach (nauseous) and this feeling does not go away.  You throw up (vomit) and this keeps happening.  You feel your heart  beating a lot more quickly than normal.  You cannot poop (have a bowel movement) or pass gas. This information is not intended to replace advice given to you by your health care provider. Make sure you discuss any questions you have with your health care provider. Document Released: 02/18/2006 Document Revised: 06/26/2015 Document Reviewed: 11/28/2013 Elsevier Interactive Patient Education  2018 Elsevier Inc.  

## 2017-12-01 NOTE — Progress Notes (Signed)
Patient ID: Leonard Stokes, male   DOB: [birth year], 72 y.o.   MRN: [id number]  Chief Complaint  Patient presents with  . Other    discuss surgery    HPI Leonard Stokes is a 72 y.o. male.  Here to discuss right inguinal hernia surgery. The patient was seen here March, 2019 regarding his hernia, and was elected to put repair on hold as he was scheduled for a prostate biopsy.  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  He is finding the hot flashes somewhat troublesome.  He has completed his prostate cancer treatments and is doing well. He states it has not changed in size since March and still has some burning. He states he voids up to 3-4 times during the night.  HPI  Past Medical History:  Diagnosis Date  . Arthritis   . Atherosclerosis of aorta ([place]) 2018  . BPH (benign prostatic hyperplasia)   . Cancer [facility]) 2019   prostate  . Chronic kidney disease 08/2016   kidney stone  . Diverticulosis of sigmoid colon   . Leg cramps     Past Surgical History:  Procedure Laterality Date  . APPENDECTOMY    . EXTRACORPOREAL SHOCK WAVE LITHOTRIPSY Right 08/19/2016   Procedure: EXTRACORPOREAL SHOCK WAVE LITHOTRIPSY (ESWL);  Surgeon: [name], MD;  Location: [facility] ORS;  Service: Urology;  Laterality: Right;  . EXTRACORPOREAL SHOCK WAVE LITHOTRIPSY Left 03/24/2017   Procedure: EXTRACORPOREAL SHOCK WAVE LITHOTRIPSY (ESWL);  Surgeon: [name], MD;  Location: [facility] ORS;  Service: Urology;  Laterality: Left;  . EYE MUSCLE SURGERY    . KNEE ARTHROPLASTY Left 11/15/2016   Procedure: COMPUTER ASSISTED TOTAL KNEE ARTHROPLASTY;  Surgeon: Dereck Leep, MD;  Location: [facility] ORS;  Service: Orthopedics;  Laterality: Left;  . TOTAL KNEE ARTHROPLASTY Right     No family history on file.  Social History Social History   Tobacco Use  . Smoking status: Former Smoker    Last attempt to quit: 05/31/1996    Years since  quitting: 21.5  . Smokeless tobacco: Never Used  Substance Use Topics  . Alcohol use: Yes    Comment: 4-5 week  . Drug use: No    Allergies  Allergen Reactions  . Codeine Rash    Current Outpatient Medications  Medication Sig Dispense Refill  . acetaminophen (TYLENOL) 500 MG tablet Take 1,000 mg by mouth every 6 (six) hours as needed.     . Ascorbic Acid (VITAMIN C) 1000 MG tablet Take 1,000 mg by mouth daily.    . B Complex Vitamins (B COMPLEX-B12 PO) Take 1 tablet by mouth daily.    . Cholecalciferol (VITAMIN D3) 5000 units CAPS Take 5,000 Units by mouth daily.    . COD LIVER OIL PO Take 2 capsules by mouth daily.    Marland Kitchen losartan-hydrochlorothiazide (HYZAAR) 50-12.5 MG tablet Take 1 tablet by mouth daily.    . meloxicam (MOBIC) 15 MG tablet Take 15 mg by mouth daily.     . Misc Natural Products (OSTEO BI-FLEX TRIPLE STRENGTH PO) Take 2 tablets by mouth daily.    . Multiple Vitamins-Minerals (SENIOR MULTIVITAMIN PLUS PO) Take 1 tablet by mouth daily.    . sildenafil (REVATIO) 20 MG tablet Take 20 mg by mouth daily as needed (for ED).    Marland Kitchen tamsulosin (FLOMAX) 0.4 MG CAPS capsule Take 1 capsule (0.4 mg total) by mouth daily. 30 capsule 0  . TURMERIC  CURCUMIN PO Take 2,000 mg by mouth daily.     Marland Kitchen zolpidem (AMBIEN) 10 MG tablet Take 5 mg by mouth at bedtime as needed for sleep     No current facility-administered medications for this visit.     Review of Systems Review of Systems  Constitutional: Negative.   Respiratory: Negative.   Cardiovascular: Negative.   Gastrointestinal: Negative for constipation and diarrhea.    Blood pressure 115/73, pulse 76, temperature (!) 97.2 F (36.2 C), temperature source Skin, resp. rate 11, height 5\' 11"  (1.803 m), weight 183 lb 9.6 oz (83.3 kg), SpO2 94 %.  Physical Exam Physical Exam  Constitutional: He is oriented to person, place, and time. He appears well-developed and well-nourished.  HENT:  Mouth/Throat: No oropharyngeal exudate.   Eyes: Conjunctivae are normal. No scleral icterus.  Neck: Neck supple.  Cardiovascular: Normal rate, regular rhythm and normal heart sounds.  Pulmonary/Chest: Effort normal and breath sounds normal.  Abdominal: Soft. Bowel sounds are normal. A hernia is present. Hernia confirmed positive in the right inguinal area. Hernia confirmed negative in the left inguinal area.  Genitourinary: Testes normal.     Neurological: He is alert and oriented to person, place, and time.  Skin: Skin is warm and dry.  Psychiatric: His behavior is normal.    Data Reviewed Urology and radiation oncology notes reviewed.  Assessment    Right inguinal hernia, symptomatic.    Plan    Hernia precautions and incarceration were discussed with the patient. If they develop symptoms of an incarcerated hernia, they were encouraged to seek prompt medical attention.  I have recommended repair of the hernia using mesh on an outpatient basis in the near future. The risk of infection was reviewed. The role of prosthetic mesh to minimize the risk of recurrence was reviewed.      HPI, Physical Exam, Assessment and Plan have been scribed under the direction and in the presence of Robert Bellow, MD. Karie Fetch, RN  I have completed the exam and reviewed the above documentation for accuracy and completeness.  I agree with the above.  Haematologist has been used and any errors in dictation or transcription are unintentional.  Hervey Ard, M.D., F.A.C.S.   Forest Gleason Jahking Lesser 12/01/2017, 10:01 PM  Patient's surgery has been scheduled for 12-23-17 at [facility] with Dr. Bary Castilla.  The patient is aware to call the office should they have further questions.   Dominga Ferry, CMA

## 2017-12-15 ENCOUNTER — Encounter
Admission: RE | Admit: 2017-12-15 | Discharge: 2017-12-15 | Disposition: A | Discharge status: Home / Self Care | Payer: PPO | Source: Ambulatory Visit | Attending: General Surgery | Admitting: General Surgery

## 2017-12-15 ENCOUNTER — Other Ambulatory Visit: Payer: Self-pay

## 2017-12-15 DIAGNOSIS — Z01818 Encounter for other preprocedural examination: Secondary | ICD-10-CM | POA: Insufficient documentation

## 2017-12-15 DIAGNOSIS — I10 Essential (primary) hypertension: Secondary | ICD-10-CM | POA: Diagnosis not present

## 2017-12-15 HISTORY — DX: Anemia, unspecified: D64.9

## 2017-12-15 HISTORY — DX: Essential (primary) hypertension: I10

## 2017-12-15 HISTORY — DX: Personal history of urinary calculi: Z87.442

## 2017-12-15 LAB — HEMOGLOBIN: Hemoglobin: 12.6 g/dL — ABNORMAL LOW (ref 13.0–17.0)

## 2017-12-15 LAB — POTASSIUM: POTASSIUM: 4.3 mmol/L (ref 3.5–5.1)

## 2017-12-15 NOTE — Patient Instructions (Addendum)
Your procedure is scheduled on: 12-23-17 FRIDAY Report to Same Day Surgery 2nd floor medical mall [facility] Entrance-take elevator on left to 2nd floor.  Check in with surgery information desk.) To find out your arrival time please call [phone] between 1PM - 3PM on 12-22-17 THURSDAY  Remember: Instructions that are not followed completely may result in serious medical risk, up to and including death, or upon the discretion of your surgeon and anesthesiologist your surgery may need to be rescheduled.    _x___ 1. Do not eat food after midnight the night before your procedure. NO GUM OR CANDY AFTER MIDNIGHT.  You may drink clear liquids up to 2 hours before you are scheduled to arrive at the hospital for your procedure.  Do not drink clear liquids within 2 hours of your scheduled arrival to the hospital.  Clear liquids include  --Water or Apple juice without pulp  --Clear carbohydrate beverage such as ClearFast or Gatorade  --Black Coffee or Clear Tea (No milk, no creamers, do not add anything to the coffee or Tea   ____Ensure clear carbohydrate drink on the way to the hospital for bariatric patients  ____Ensure clear carbohydrate drink 3 hours before surgery for Dr Dwyane Luo patients if physician instructed.      __x__ 2. No Alcohol for 24 hours before or after surgery.   __x__3. No Smoking or e-cigarettes for 24 prior to surgery.  Do not use any chewable tobacco products for at least 6 hour prior to surgery   ____  4. Bring all medications with you on the day of surgery if instructed.    __x__ 5. Notify your doctor if there is any change in your medical condition     (cold, fever, infections).    x___6. On the morning of surgery brush your teeth with toothpaste and water.  You may rinse your mouth with mouth wash if you wish.  Do not swallow any toothpaste or mouthwash.   Do not wear jewelry, make-up, hairpins, clips or nail polish.  Do not wear lotions, powders, or  perfumes. You may wear deodorant.  Do not shave 48 hours prior to surgery. Men may shave face and neck.  Do not bring valuables to the hospital.    [facility] is not responsible for any belongings or valuables.               Contacts, dentures or bridgework may not be worn into surgery.  Leave your suitcase in the car. After surgery it may be brought to your room.  For patients admitted to the hospital, discharge time is determined by your treatment team.  _  Patients discharged the day of surgery will not be allowed to drive home.  You will need someone to drive you home and stay with you the night of your procedure.    Please read over the following fact sheets that you were given:   [facility] Preparing for Surgery  _x___ TAKE THE FOLLOWING MEDICATION THE MORNING OF SURGERY WITH A SMALL SIP OF WATER. These include:  1. FLOMAX (TAMSULOSIN)  2.  3.  4.  5.  6.  ____Fleets enema or Magnesium Citrate as directed.   _x___ Use CHG Soap or sage wipes as directed on instruction sheet   ____ Use inhalers on the day of surgery and bring to hospital day of surgery  ____ Stop Metformin and Janumet 2 days prior to surgery.    ____ Take 1/2 of usual insulin dose the night  before surgery and none on the morning surgery.   ____ Follow recommendations from Cardiologist, Pulmonologist or PCP regarding stopping Aspirin, Coumadin, Plavix ,Eliquis, Effient, or Pradaxa, and Pletal.  ____Stop Anti-inflammatories such as Advil, Aleve, Ibuprofen, Motrin, Naproxen, Naprosyn, Goodies powders or aspirin products. OK to take Tylenol   _x___ Stop supplements until after surgery-STOP FISH OIL, OSTEO-BIFLEX, TURMERIC, AND VITAMIN C NOW   ____ Bring C-Pap to the hospital.

## 2017-12-19 DIAGNOSIS — H2513 Age-related nuclear cataract, bilateral: Secondary | ICD-10-CM | POA: Diagnosis not present

## 2017-12-23 ENCOUNTER — Encounter
Admission: RE | Disposition: A | Discharge status: Home / Self Care | Payer: Self-pay | Source: Ambulatory Visit | Attending: General Surgery

## 2017-12-23 ENCOUNTER — Ambulatory Visit: Payer: PPO | Admitting: Anesthesiology

## 2017-12-23 ENCOUNTER — Ambulatory Visit
Admission: RE | Admit: 2017-12-23 | Discharge: 2017-12-23 | Disposition: A | Discharge status: Home / Self Care | Payer: PPO | Source: Ambulatory Visit | Attending: General Surgery | Admitting: General Surgery

## 2017-12-23 ENCOUNTER — Encounter: Payer: Self-pay | Admitting: *Deleted

## 2017-12-23 ENCOUNTER — Other Ambulatory Visit: Payer: Self-pay

## 2017-12-23 DIAGNOSIS — Z87891 Personal history of nicotine dependence: Secondary | ICD-10-CM | POA: Diagnosis not present

## 2017-12-23 DIAGNOSIS — C61 Malignant neoplasm of prostate: Secondary | ICD-10-CM | POA: Insufficient documentation

## 2017-12-23 DIAGNOSIS — Z79899 Other long term (current) drug therapy: Secondary | ICD-10-CM | POA: Diagnosis not present

## 2017-12-23 DIAGNOSIS — I739 Peripheral vascular disease, unspecified: Secondary | ICD-10-CM | POA: Diagnosis not present

## 2017-12-23 DIAGNOSIS — K4090 Unilateral inguinal hernia, without obstruction or gangrene, not specified as recurrent: Secondary | ICD-10-CM | POA: Diagnosis not present

## 2017-12-23 DIAGNOSIS — I129 Hypertensive chronic kidney disease with stage 1 through stage 4 chronic kidney disease, or unspecified chronic kidney disease: Secondary | ICD-10-CM | POA: Diagnosis not present

## 2017-12-23 DIAGNOSIS — N189 Chronic kidney disease, unspecified: Secondary | ICD-10-CM | POA: Diagnosis not present

## 2017-12-23 DIAGNOSIS — N400 Enlarged prostate without lower urinary tract symptoms: Secondary | ICD-10-CM | POA: Insufficient documentation

## 2017-12-23 HISTORY — PX: INGUINAL HERNIA REPAIR: SHX194

## 2017-12-23 SURGERY — REPAIR, HERNIA, INGUINAL, ADULT
Anesthesia: General | Laterality: Right

## 2017-12-23 MED ORDER — BUPIVACAINE-EPINEPHRINE (PF) 0.5% -1:200000 IJ SOLN
INTRAMUSCULAR | Status: DC | PRN
  Administered 2017-12-23: 30 mL

## 2017-12-23 MED ORDER — HYDROMORPHONE HCL 1 MG/ML IJ SOLN
INTRAMUSCULAR | Status: DC | PRN
  Administered 2017-12-23: 0.5 mg via INTRAVENOUS

## 2017-12-23 MED ORDER — GABAPENTIN 300 MG PO CAPS
ORAL_CAPSULE | ORAL | Status: AC
  Filled 2017-12-23: qty 1

## 2017-12-23 MED ORDER — PROPOFOL 10 MG/ML IV BOLUS
INTRAVENOUS | Status: DC | PRN
  Administered 2017-12-23: 120 mg via INTRAVENOUS

## 2017-12-23 MED ORDER — LIDOCAINE HCL (CARDIAC) PF 100 MG/5ML IV SOSY
PREFILLED_SYRINGE | INTRAVENOUS | Status: DC | PRN
  Administered 2017-12-23: 80 mg via INTRAVENOUS

## 2017-12-23 MED ORDER — FAMOTIDINE 20 MG PO TABS
20.0000 mg | ORAL_TABLET | Freq: Once | ORAL | Status: AC
  Administered 2017-12-23: 20 mg via ORAL

## 2017-12-23 MED ORDER — KETOROLAC TROMETHAMINE 30 MG/ML IJ SOLN
INTRAMUSCULAR | Status: DC | PRN
  Administered 2017-12-23: 30 mg via INTRAVENOUS

## 2017-12-23 MED ORDER — HYDROMORPHONE HCL 1 MG/ML IJ SOLN
INTRAMUSCULAR | Status: AC
  Filled 2017-12-23: qty 1

## 2017-12-23 MED ORDER — HYDROCODONE-ACETAMINOPHEN 5-325 MG PO TABS: 1.0000 | ORAL_TABLET | ORAL | 0 refills | Status: DC | PRN

## 2017-12-23 MED ORDER — FENTANYL CITRATE (PF) 100 MCG/2ML IJ SOLN: 25.0000 ug | INTRAMUSCULAR | Status: DC | PRN

## 2017-12-23 MED ORDER — ONDANSETRON HCL 4 MG/2ML IJ SOLN: 4.0000 mg | Freq: Once | INTRAMUSCULAR | Status: DC | PRN

## 2017-12-23 MED ORDER — CELECOXIB 200 MG PO CAPS
200.0000 mg | ORAL_CAPSULE | ORAL | Status: AC
  Administered 2017-12-23: 200 mg via ORAL

## 2017-12-23 MED ORDER — ACETAMINOPHEN 10 MG/ML IV SOLN
INTRAVENOUS | Status: DC | PRN
  Administered 2017-12-23: 1000 mg via INTRAVENOUS

## 2017-12-23 MED ORDER — LACTATED RINGERS IV SOLN
INTRAVENOUS | Status: DC
  Administered 2017-12-23: 09:00:00 via INTRAVENOUS

## 2017-12-23 MED ORDER — HYDROCODONE-ACETAMINOPHEN 5-325 MG PO TABS: 1.0000 | ORAL_TABLET | ORAL | Status: DC | PRN

## 2017-12-23 MED ORDER — FENTANYL CITRATE (PF) 100 MCG/2ML IJ SOLN
INTRAMUSCULAR | Status: AC
  Filled 2017-12-23: qty 2

## 2017-12-23 MED ORDER — ONDANSETRON HCL 4 MG/2ML IJ SOLN
INTRAMUSCULAR | Status: DC | PRN
  Administered 2017-12-23: 4 mg via INTRAVENOUS

## 2017-12-23 MED ORDER — DEXAMETHASONE SODIUM PHOSPHATE 10 MG/ML IJ SOLN
INTRAMUSCULAR | Status: DC | PRN
  Administered 2017-12-23: 10 mg via INTRAVENOUS

## 2017-12-23 MED ORDER — MIDAZOLAM HCL 2 MG/2ML IJ SOLN
INTRAMUSCULAR | Status: DC | PRN
  Administered 2017-12-23: 2 mg via INTRAVENOUS

## 2017-12-23 MED ORDER — FAMOTIDINE 20 MG PO TABS
ORAL_TABLET | ORAL | Status: AC
  Filled 2017-12-23: qty 1

## 2017-12-23 MED ORDER — MIDAZOLAM HCL 2 MG/2ML IJ SOLN
INTRAMUSCULAR | Status: AC
  Filled 2017-12-23: qty 2

## 2017-12-23 MED ORDER — LACTATED RINGERS IV SOLN
INTRAVENOUS | Status: DC | PRN
  Administered 2017-12-23: 09:00:00 via INTRAVENOUS

## 2017-12-23 MED ORDER — CEFAZOLIN SODIUM-DEXTROSE 2-4 GM/100ML-% IV SOLN
2.0000 g | INTRAVENOUS | Status: AC
  Administered 2017-12-23: 2 g via INTRAVENOUS

## 2017-12-23 MED ORDER — FENTANYL CITRATE (PF) 100 MCG/2ML IJ SOLN: INTRAMUSCULAR | Status: DC | PRN

## 2017-12-23 MED ORDER — CEFAZOLIN SODIUM-DEXTROSE 2-4 GM/100ML-% IV SOLN
INTRAVENOUS | Status: AC
  Filled 2017-12-23: qty 100

## 2017-12-23 MED ORDER — CELECOXIB 200 MG PO CAPS
ORAL_CAPSULE | ORAL | Status: AC
  Filled 2017-12-23: qty 1

## 2017-12-23 MED ORDER — GABAPENTIN 300 MG PO CAPS
300.0000 mg | ORAL_CAPSULE | ORAL | Status: AC
  Administered 2017-12-23: 300 mg via ORAL

## 2017-12-23 SURGICAL SUPPLY — 34 items
BLADE SURG 15 STRL SS SAFETY (BLADE) ×4 IMPLANT
CANISTER SUCT 1200ML W/VALVE (MISCELLANEOUS) ×2 IMPLANT
CHLORAPREP W/TINT 26ML (MISCELLANEOUS) ×2 IMPLANT
COVER WAND RF STERILE (DRAPES) ×1 IMPLANT
DECANTER SPIKE VIAL GLASS SM (MISCELLANEOUS) ×2 IMPLANT
DRAIN PENROSE 1/4X12 LTX (DRAIN) ×2 IMPLANT
DRAPE LAPAROTOMY 100X77 ABD (DRAPES) ×2 IMPLANT
DRSG TEGADERM 4X4.75 (GAUZE/BANDAGES/DRESSINGS) ×2 IMPLANT
DRSG TELFA 4X3 1S NADH ST (GAUZE/BANDAGES/DRESSINGS) ×2 IMPLANT
ELECT REM PT RETURN 9FT ADLT (ELECTROSURGICAL) ×2
ELECTRODE REM PT RTRN 9FT ADLT (ELECTROSURGICAL) ×1 IMPLANT
GLOVE BIO SURGEON STRL SZ7.5 (GLOVE) ×3 IMPLANT
GLOVE INDICATOR 8.0 STRL GRN (GLOVE) ×3 IMPLANT
GOWN STRL REUS W/ TWL LRG LVL3 (GOWN DISPOSABLE) ×2 IMPLANT
GOWN STRL REUS W/TWL LRG LVL3 (GOWN DISPOSABLE) ×4
KIT TURNOVER KIT A (KITS) ×2 IMPLANT
LABEL OR SOLS (LABEL) ×2 IMPLANT
MESH HERNIA 6X12 ULTRAPRO MED (Mesh General) ×1 IMPLANT
MESH HERNIA ULTRAPRO MED (Mesh General) ×1 IMPLANT
NEEDLE HYPO 22GX1.5 SAFETY (NEEDLE) ×4 IMPLANT
PACK BASIN MINOR ARMC (MISCELLANEOUS) ×2 IMPLANT
STRIP CLOSURE SKIN 1/2X4 (GAUZE/BANDAGES/DRESSINGS) ×2 IMPLANT
SUT PDS AB 0 CT1 27 (SUTURE) ×2 IMPLANT
SUT SURGILON 0 BLK (SUTURE) ×3 IMPLANT
SUT VIC AB 2-0 SH 27 (SUTURE) ×2
SUT VIC AB 2-0 SH 27XBRD (SUTURE) ×1 IMPLANT
SUT VIC AB 3-0 54X BRD REEL (SUTURE) ×1 IMPLANT
SUT VIC AB 3-0 BRD 54 (SUTURE) ×2
SUT VIC AB 3-0 SH 27 (SUTURE) ×2
SUT VIC AB 3-0 SH 27X BRD (SUTURE) ×1 IMPLANT
SUT VIC AB 4-0 FS2 27 (SUTURE) ×2 IMPLANT
SWABSTK COMLB BENZOIN TINCTURE (MISCELLANEOUS) ×2 IMPLANT
SYR 10ML LL (SYRINGE) ×2 IMPLANT
SYR 3ML LL SCALE MARK (SYRINGE) ×2 IMPLANT

## 2017-12-23 NOTE — Anesthesia Post-op Follow-up Note (Signed)
Anesthesia QCDR form completed.        

## 2017-12-23 NOTE — Anesthesia Procedure Notes (Signed)
Procedure Name: LMA Insertion Date/Time: 12/23/2017 9:35 AM Performed by: Justus Memory, CRNA Pre-anesthesia Checklist: Patient identified, Patient being monitored, Timeout performed, Emergency Drugs available and Suction available Patient Re-evaluated:Patient Re-evaluated prior to induction Oxygen Delivery Method: Circle system utilized Preoxygenation: Pre-oxygenation with 100% oxygen Induction Type: IV induction Ventilation: Mask ventilation without difficulty LMA: LMA inserted LMA Size: 4.5 Tube type: Oral Number of attempts: 1 Placement Confirmation: positive ETCO2 and breath sounds checked- equal and bilateral Tube secured with: Tape Dental Injury: Teeth and Oropharynx as per pre-operative assessment

## 2017-12-23 NOTE — H&P (Signed)
No change in clinical history or exam. For right inguinal hernia repair.  

## 2017-12-23 NOTE — Op Note (Signed)
Preoperative diagnosis: Symptomatic right inguinal hernia.  Postoperative diagnosis: Same, direct.  Operative procedure: Right inguinal hernia repair with medium Ultra Pro mesh.  Operating Surgeon: Hervey Ard, MD.  Anesthesia: General by LMA, Marcaine 0.5% with 1 to 200,000 units of epinephrine, 30 cc; Toradol: 30 mg.  Estimated blood loss: Less than 5 cc.  Clinical note: This 72 year old male was recently noted a right inguinal hernia.  He is under going treatment for advanced prostate cancer.  He is felt to be a candidate for elective repair.  Hair was removed and the surgical site with clippers prior to presentation of the operating theater.  He received Kefzol intravenously prior to the procedure.  Operative note: With the patient under adequate general anesthesia the area was cleansed with ChloraPrep and draped.  We will block anesthesia was established.  A 5 cm skin line incision along the anticipated course the inguinal canal was carried out the skin subtendinous tissue with hemostasis achieved by electrocautery.  The external Bleich was opened in the direction of its fibers.  The ilioinguinal and iliohypogastric nerves were identified and protected.  The cord was dissected at the level the internal inguinal ring with no evidence of an indirect defect.  A sizable direct defect was appreciated.  Preperitoneal space was cleared and a medium ultra Pro mesh smoothed into position.  The external component was laid along the floor the inguinal canal and anchored to the pubic tubercle with 0 Surgilon.  The inferior edge of the mesh was sewn to the inguinal ligament with similar sutures.  The medial and superior borders were anchored to the transverse abdominis aponeurosis with interrupted 0 Surgilon sutures.  A lateral slit was made for cord passage.  The nerves were returned to their bed.  Toradol was placed in the wound.  The external Bleich was closed with a running 2-0 Vicryl.  Scarpa's fascia  was closed with a running 3-0 Vicryl.  The skin was closed with a running 4-0 Vicryl subcuticular suture.  Benzoin Steri-Strips followed by Telfa and Tegaderm dressing were applied.  The patient tolerated the procedure well and was taken to recovery room in stable condition.

## 2017-12-23 NOTE — Transfer of Care (Signed)
Immediate Anesthesia Transfer of Care Note  Patient: Leonard Stokes  Procedure(s) Performed: HERNIA REPAIR INGUINAL ADULT (Right )  Patient Location: PACU  Anesthesia Type:General  Level of Consciousness: sedated  Airway & Oxygen Therapy: Patient Spontanous Breathing and Patient connected to face mask oxygen  Post-op Assessment: Report given to RN and Post -op Vital signs reviewed and stable  Post vital signs: Reviewed and stable  Last Vitals:  Vitals Value Taken Time  BP 116/67 12/23/2017 10:24 AM  Temp 36.6 C 12/23/2017 10:24 AM  Pulse 82 12/23/2017 10:29 AM  Resp 15 12/23/2017 10:29 AM  SpO2 99 % 12/23/2017 10:29 AM  Vitals shown include unvalidated device data.  Last Pain:  Vitals:   12/23/17 1024  TempSrc:   PainSc: 0-No pain         Complications: No apparent anesthesia complications

## 2017-12-23 NOTE — Progress Notes (Signed)
Dr. Bary Castilla in to evaluate pt. Okay to d/c home.

## 2017-12-23 NOTE — Discharge Instructions (Signed)

## 2017-12-23 NOTE — Anesthesia Preprocedure Evaluation (Signed)
Anesthesia Evaluation  Patient identified by MRN, date of birth, ID band Patient awake    Reviewed: Allergy & Precautions, H&P , NPO status , Patient's Chart, lab work & pertinent test results  History of Anesthesia Complications (+) PONV and history of anesthetic complications  Airway Mallampati: III  TM Distance: <3 FB Neck ROM: limited    Dental  (+) Poor Dentition, Chipped, Caps   Pulmonary neg shortness of breath, former smoker,           Cardiovascular Exercise Tolerance: Good hypertension, (-) angina+ Peripheral Vascular Disease  (-) Past MI      Neuro/Psych negative neurological ROS  negative psych ROS   GI/Hepatic negative GI ROS, Neg liver ROS, neg GERD  ,  Endo/Other  negative endocrine ROS  Renal/GU Renal disease     Musculoskeletal  (+) Arthritis ,   Abdominal   Peds  Hematology negative hematology ROS (+) anemia ,   Anesthesia Other Findings Past Medical History: No date: Arthritis 2018: Atherosclerosis of aorta ([facility]) No date: BPH (benign prostatic hyperplasia) 08/2016: Chronic kidney disease     Comment:  kidney stone No date: Diverticulosis of sigmoid colon No date: Leg cramps  Past Surgical History: No date: APPENDECTOMY 08/19/2016: EXTRACORPOREAL SHOCK WAVE LITHOTRIPSY; Right     Comment:  Procedure: EXTRACORPOREAL SHOCK WAVE LITHOTRIPSY (ESWL);              Surgeon: [name], MD;  Location: [facility] ORS;                Service: Urology;  Laterality: Right; No date: EYE MUSCLE SURGERY No date: TOTAL KNEE ARTHROPLASTY; Right  BMI    Body Mass Index:  25.09 kg/m      Reproductive/Obstetrics negative OB ROS                             Anesthesia Physical  Anesthesia Plan  ASA: III  Anesthesia Plan: General   Post-op Pain Management:    Induction:   PONV Risk Score and Plan:   Airway Management Planned: LMA  Additional Equipment:   Intra-op  Plan:   Post-operative Plan: Extubation in OR  Informed Consent: I have reviewed the patients History and Physical, chart, labs and discussed the procedure including the risks, benefits and alternatives for the proposed anesthesia with the patient or authorized representative who has indicated his/her understanding and acceptance.   Dental Advisory Given  Plan Discussed with: Anesthesiologist, CRNA and Surgeon  Anesthesia Plan Comments: (Patient reports no bleeding problems and no anticoagulant use.  Plan for spinal with backup GA  Patient consented for risks of anesthesia including but not limited to:  - adverse reactions to medications - risk of bleeding, infection, nerve damage and headache - risk of failed spinal - damage to teeth, lips or other oral mucosa - sore throat or hoarseness - Damage to heart, brain, lungs or loss of life  Patient voiced understanding.)        Anesthesia Quick Evaluation

## 2017-12-25 ENCOUNTER — Encounter: Payer: Self-pay | Admitting: General Surgery

## 2017-12-25 NOTE — Anesthesia Postprocedure Evaluation (Signed)
Anesthesia Post Note  Patient: Leonard Stokes  Procedure(s) Performed: HERNIA REPAIR INGUINAL ADULT (Right )  Patient location during evaluation: PACU Anesthesia Type: General Level of consciousness: awake and alert and oriented Pain management: pain level controlled Vital Signs Assessment: post-procedure vital signs reviewed and stable Respiratory status: spontaneous breathing Cardiovascular status: blood pressure returned to baseline Anesthetic complications: no     Last Vitals:  Vitals:   12/23/17 1051 12/23/17 1059  BP: 126/88 116/68  Pulse: 77 76  Resp: (!) 21 16  Temp: 36.4 C (!) 36.3 C  SpO2: 100% 99%    Last Pain:  Vitals:   12/23/17 1059  TempSrc: Temporal  PainSc: 0-No pain                 Kymoni Lesperance

## 2018-01-03 ENCOUNTER — Ambulatory Visit (INDEPENDENT_AMBULATORY_CARE_PROVIDER_SITE_OTHER): Payer: PPO | Admitting: General Surgery

## 2018-01-03 ENCOUNTER — Other Ambulatory Visit: Payer: Self-pay

## 2018-01-03 ENCOUNTER — Encounter: Payer: Self-pay | Admitting: General Surgery

## 2018-01-03 VITALS — BP 126/79 | HR 67 | Temp 97.0°F | Resp 16 | Ht 72.0 in | Wt 187.0 lb

## 2018-01-03 DIAGNOSIS — K4090 Unilateral inguinal hernia, without obstruction or gangrene, not specified as recurrent: Secondary | ICD-10-CM

## 2018-01-03 NOTE — Patient Instructions (Addendum)
You may use heat to the area.  Please call our office if you have questions or concerns.

## 2018-01-03 NOTE — Progress Notes (Signed)
Patient ID: Leonard Stokes, male   DOB: [birth year], 72 y.o.   MRN: [id number]  No chief complaint on file.   HPI Leonard Stokes is a 72 y.o. male.  Here today for right inguinal hernia repair 12/23/17. Patient states he picked something up last week when his grandchildren were visiting and he has felt an intermittent burning sensation in the right groin area since that time. Discomfort, but no pain. Denies swelling. No nausea or vomiting.    HPI  Past Medical History:  Diagnosis Date  . Anemia   . Arthritis   . Atherosclerosis of aorta ([place]) 2018  . BPH (benign prostatic hyperplasia)   . Cancer [facility]) 2019   prostate  . Chronic kidney disease 08/2016   kidney stone  . Diverticulosis of sigmoid colon   . History of kidney stones   . Hypertension   . Leg cramps     Past Surgical History:  Procedure Laterality Date  . APPENDECTOMY    . EXTRACORPOREAL SHOCK WAVE LITHOTRIPSY Right 08/19/2016   Procedure: EXTRACORPOREAL SHOCK WAVE LITHOTRIPSY (ESWL);  Surgeon: [name], MD;  Location: [facility] ORS;  Service: Urology;  Laterality: Right;  . EXTRACORPOREAL SHOCK WAVE LITHOTRIPSY Left 03/24/2017   Procedure: EXTRACORPOREAL SHOCK WAVE LITHOTRIPSY (ESWL);  Surgeon: [name], MD;  Location: [facility] ORS;  Service: Urology;  Laterality: Left;  . EYE MUSCLE SURGERY    . INGUINAL HERNIA REPAIR Right 12/23/2017   Direct hernia, medium Ultra Pro mesh;  Surgeon: Robert Bellow, MD;  Location: [facility] ORS;  Service: General;  Laterality: Right;  . JOINT REPLACEMENT    . KNEE ARTHROPLASTY Left 11/15/2016   Procedure: COMPUTER ASSISTED TOTAL KNEE ARTHROPLASTY;  Surgeon: Dereck Leep, MD;  Location: [facility] ORS;  Service: Orthopedics;  Laterality: Left;  . TOTAL KNEE ARTHROPLASTY Right     History reviewed. No pertinent family history.  Social History Social History   Tobacco Use  . Smoking status: Former Smoker    Packs/day: 3.00    Years: 20.00    Pack years: 60.00    Types:  Cigarettes    Last attempt to quit: 05/31/1996    Years since quitting: 21.6  . Smokeless tobacco: Never Used  Substance Use Topics  . Alcohol use: Yes    Comment: 4-5 week  . Drug use: No    Allergies  Allergen Reactions  . Codeine Rash    Current Outpatient Medications  Medication Sig Dispense Refill  . acetaminophen (TYLENOL) 500 MG tablet Take 1,000 mg by mouth every 6 (six) hours as needed for moderate pain.     . Ascorbic Acid (VITAMIN C) 1000 MG tablet Take 1,000 mg by mouth daily.    . B Complex Vitamins (B COMPLEX-B12 PO) Take 1 tablet by mouth daily.    . Calcium Acetate, Phos Binder, (CALCIUM ACETATE PO) Take 2 tablets by mouth daily.    . Cholecalciferol (VITAMIN D-3) 125 MCG (5000 UT) TABS Take 5,000 Units by mouth daily.    . COD LIVER OIL PO Take 415 mg by mouth daily.     . ferrous sulfate 325 (65 FE) MG tablet Take 325 mg by mouth daily with breakfast.    . hydrochlorothiazide (HYDRODIURIL) 12.5 MG tablet     . HYDROcodone-acetaminophen (NORCO/VICODIN) 5-325 MG tablet Take 1 tablet by mouth every 4 (four) hours as needed for moderate pain. 15 tablet 0  . losartan-hydrochlorothiazide (HYZAAR) 50-12.5 MG tablet Take 1 tablet by mouth every morning.     Marland Kitchen  meloxicam (MOBIC) 15 MG tablet Take 15 mg by mouth daily.     . Misc Natural Products (OSTEO BI-FLEX TRIPLE STRENGTH PO) Take 1 tablet by mouth daily.     . Multiple Vitamins-Minerals (SENIOR MULTIVITAMIN PLUS PO) Take 1 tablet by mouth daily.    . sildenafil (REVATIO) 20 MG tablet Take 20-100 mg by mouth daily as needed (for ED).     Marland Kitchen tamsulosin (FLOMAX) 0.4 MG CAPS capsule Take 1 capsule (0.4 mg total) by mouth daily. (Patient taking differently: Take 0.4 mg by mouth daily after breakfast. ) 30 capsule 0  . TURMERIC CURCUMIN PO Take 1,000 mg by mouth daily.     Marland Kitchen zolpidem (AMBIEN) 10 MG tablet Take 10 mg by mouth at bedtime as needed for sleep.      No current facility-administered medications for this visit.      Review of Systems Review of Systems  Constitutional: Negative.   Respiratory: Negative.   Skin: Negative.     Blood pressure 126/79, pulse 67, temperature (!) 97 F (36.1 C), temperature source Temporal, resp. rate 16, height 6' (1.829 m), weight 187 lb (84.8 kg), SpO2 97 %.  Physical Exam Physical Exam  Constitutional: He is oriented to person, place, and time. He appears well-developed and well-nourished.  Pulmonary/Chest: Effort normal.  Genitourinary:     Neurological: He is alert and oriented to person, place, and time.  Skin: Skin is warm and dry.       Assessment    Doing well post repair of right direct inguinal hernia.      Plan     The patient was encouraged to make use of local heat to resolve the residual soreness.  I anticipate the burning discomfort will resolve over the coming weeks.  Regarding exercise she should concentrate on walking for the next couple of weeks and then after the first of the year if he wants to do weightbearing exercises he should start using one arm or leg at a time and then work up to using both arms and legs for weight lifting..      Please call our office if you have questions or concerns.     HPI, Physical Exam, Assessment and Plan have been scribed under the direction and in the presence of Robert Bellow, MD. Jonnie Finner, CMA  I have completed the exam and reviewed the above documentation for accuracy and completeness.  I agree with the above.  Haematologist has been used and any errors in dictation or transcription are unintentional.  Hervey Ard, M.D., F.A.C.S.  [name] 01/03/2018, 10:26 AM

## 2018-01-06 ENCOUNTER — Inpatient Hospital Stay: Payer: PPO | Attending: Radiation Oncology

## 2018-01-06 ENCOUNTER — Other Ambulatory Visit: Payer: Self-pay

## 2018-01-06 DIAGNOSIS — C61 Malignant neoplasm of prostate: Secondary | ICD-10-CM | POA: Insufficient documentation

## 2018-01-06 LAB — PSA: PROSTATIC SPECIFIC ANTIGEN: 0.01 ng/mL (ref 0.00–4.00)

## 2018-01-13 ENCOUNTER — Ambulatory Visit
Admission: RE | Admit: 2018-01-13 | Discharge: 2018-01-13 | Disposition: A | Discharge status: Home / Self Care | Payer: PPO | Source: Ambulatory Visit | Attending: Radiation Oncology | Admitting: Radiation Oncology

## 2018-01-13 ENCOUNTER — Other Ambulatory Visit: Payer: Self-pay

## 2018-01-13 ENCOUNTER — Other Ambulatory Visit: Payer: Self-pay | Admitting: *Deleted

## 2018-01-13 ENCOUNTER — Encounter: Payer: Self-pay | Admitting: Radiation Oncology

## 2018-01-13 VITALS — BP 133/83 | HR 69 | Temp 98.0°F | Resp 18 | Wt 187.3 lb

## 2018-01-13 DIAGNOSIS — C61 Malignant neoplasm of prostate: Secondary | ICD-10-CM | POA: Diagnosis not present

## 2018-01-13 DIAGNOSIS — Z923 Personal history of irradiation: Secondary | ICD-10-CM | POA: Insufficient documentation

## 2018-01-13 NOTE — Progress Notes (Signed)
Radiation Oncology Follow up Note  Name: Leonard Stokes   Date:   01/13/2018 MRN:  [id number] DOB: 08-Dec-1945    This 72 y.o. male presents to the clinic today for 4 month follow-up status post I MRT radiation therapy for stage IV (4+5 and Gleason score with pelvic lymphadenopathy.  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 He is currently on Lupron therapy. He specifically denies diarrhea dysuria or any other GI/GU complaints. His most recent PSA was 0.01.Marland Kitchen  COMPLICATIONS OF TREATMENT: none  FOLLOW UP COMPLIANCE: keeps appointments   PHYSICAL EXAM:  BP 133/83   Pulse 69   Temp 98 F (36.7 C)   Resp 18   Wt 187 lb 4.5 oz (84.9 kg)   BMI 25.40 kg/m  Well-developed well-nourished patient in NAD. HEENT reveals PERLA, EOMI, discs not visualized.  Oral cavity is clear. No oral mucosal lesions are identified. Neck is clear without evidence of cervical or supraclavicular adenopathy. Lungs are clear to A&P. Cardiac examination is essentially unremarkable with regular rate and rhythm without murmur rub or thrill. Abdomen is benign with no organomegaly or masses noted. Motor sensory and DTR levels are equal and symmetric in the upper and lower extremities. Cranial nerves II through XII are grossly intact. Proprioception is intact. No peripheral adenopathy or edema is identified. No motor or sensory levels are noted. Crude visual fields are within normal range.  RADIOLOGY RESULTS: no current films for review  PLAN: present time patient is under excellent biochemical control his prostate cancer. I've asked to see him back in 6 months for follow-up with a PSA prior to that visit. He continues on Lupron suppression through urology. Patient is to call with any concerns at any time.  I would like to take this opportunity  to thank you for allowing me to participate in the care of your patient.Noreene Filbert, MD

## 2018-01-19 ENCOUNTER — Encounter: Payer: Self-pay | Admitting: Urology

## 2018-01-19 ENCOUNTER — Ambulatory Visit (INDEPENDENT_AMBULATORY_CARE_PROVIDER_SITE_OTHER): Payer: PPO | Admitting: Urology

## 2018-01-19 VITALS — BP 169/71 | HR 86 | Ht 72.0 in | Wt 185.9 lb

## 2018-01-19 DIAGNOSIS — N5235 Erectile dysfunction following radiation therapy: Secondary | ICD-10-CM

## 2018-01-19 DIAGNOSIS — C61 Malignant neoplasm of prostate: Secondary | ICD-10-CM

## 2018-01-19 MED ORDER — LEUPROLIDE ACETATE (6 MONTH) 45 MG IM KIT
45.0000 mg | PACK | Freq: Once | INTRAMUSCULAR | Status: AC
  Administered 2018-01-19: 45 mg via INTRAMUSCULAR

## 2018-01-19 NOTE — Progress Notes (Signed)
01/19/2018 3:47 PM   Leonard Stokes [birth year] [id number]  Referring provider: Derinda Late, MD [phone] [facility] [place], [place] [address]  Chief Complaint  Patient presents with  . Prostate Cancer    HPI: 72 year old male approximately 4 months status post external beam radiotherapy for high risk prostate cancer with pelvic lymphadenopathy.  He started Lupron June 2019.  He recently saw Dr. Baruch Gouty and his PSA was 0.01.  He has no bothersome lower urinary tract symptoms.  He does note urinary urgency without incontinence.  He has primary concern with his ED.  He has difficulty achieving and maintaining an erection.  Sildenafil has not been effective and he wanted to discuss other options.   PMH: Past Medical History:  Diagnosis Date  . Anemia   . Arthritis   . Atherosclerosis of aorta ([place]) 2018  . BPH (benign prostatic hyperplasia)   . Cancer [facility]) 2019   prostate  . Chronic kidney disease 08/2016   kidney stone  . Diverticulosis of sigmoid colon   . History of kidney stones   . Hypertension   . Leg cramps     Surgical History: Past Surgical History:  Procedure Laterality Date  . APPENDECTOMY    . EXTRACORPOREAL SHOCK WAVE LITHOTRIPSY Right 08/19/2016   Procedure: EXTRACORPOREAL SHOCK WAVE LITHOTRIPSY (ESWL);  Surgeon: [name], MD;  Location: [facility] ORS;  Service: Urology;  Laterality: Right;  . EXTRACORPOREAL SHOCK WAVE LITHOTRIPSY Left 03/24/2017   Procedure: EXTRACORPOREAL SHOCK WAVE LITHOTRIPSY (ESWL);  Surgeon: [name], MD;  Location: [facility] ORS;  Service: Urology;  Laterality: Left;  . EYE MUSCLE SURGERY    . INGUINAL HERNIA REPAIR Right 12/23/2017   Direct hernia, medium Ultra Pro mesh;  Surgeon: Robert Bellow, MD;  Location: [facility] ORS;  Service: General;  Laterality: Right;  . JOINT REPLACEMENT    . KNEE ARTHROPLASTY Left 11/15/2016   Procedure: COMPUTER ASSISTED TOTAL KNEE ARTHROPLASTY;   Surgeon: Dereck Leep, MD;  Location: [facility] ORS;  Service: Orthopedics;  Laterality: Left;  . TOTAL KNEE ARTHROPLASTY Right     Home Medications:  Allergies as of 01/19/2018      Reactions   Codeine Rash      Medication List       Accurate as of January 19, 2018  3:47 PM. Always use your most recent med list.        acetaminophen 500 MG tablet Commonly known as:  TYLENOL Take 1,000 mg by mouth every 6 (six) hours as needed for moderate pain.   B COMPLEX-B12 PO Take 1 tablet by mouth daily.   CALCIUM ACETATE PO Take 2 tablets by mouth daily.   COD LIVER OIL PO Take 415 mg by mouth daily.   ferrous sulfate 325 (65 FE) MG tablet Take 325 mg by mouth daily with breakfast.   hydrochlorothiazide 12.5 MG tablet Commonly known as:  HYDRODIURIL   losartan-hydrochlorothiazide 50-12.5 MG tablet Commonly known as:  HYZAAR Take 1 tablet by mouth every morning.   meloxicam 15 MG tablet Commonly known as:  MOBIC Take 15 mg by mouth daily.   OSTEO BI-FLEX TRIPLE STRENGTH PO Take 1 tablet by mouth daily.   SENIOR MULTIVITAMIN PLUS PO Take 1 tablet by mouth daily.   sildenafil 20 MG tablet Commonly known as:  REVATIO Take 20-100 mg by mouth daily as needed (for ED).   tamsulosin 0.4 MG Caps capsule Commonly known as:  FLOMAX Take 1 capsule (0.4  mg total) by mouth daily.   TURMERIC CURCUMIN PO Take 1,000 mg by mouth daily.   vitamin C 1000 MG tablet Take 1,000 mg by mouth daily.   Vitamin D-3 125 MCG (5000 UT) Tabs Take 5,000 Units by mouth daily.   zolpidem 10 MG tablet Commonly known as:  AMBIEN Take 10 mg by mouth at bedtime as needed for sleep.       Allergies:  Allergies  Allergen Reactions  . Codeine Rash    Family History: No family history on file.  Social History:  reports that he quit smoking about 21 years ago. His smoking use included cigarettes. He has a 60.00 pack-year smoking history. He has never used smokeless tobacco. He reports  current alcohol use. He reports that he does not use drugs.  ROS: UROLOGY Frequent Urination?: No Hard to postpone urination?: Yes Burning/pain with urination?: No Get up at night to urinate?: No Leakage of urine?: No Urine stream starts and stops?: Yes Trouble starting stream?: No Do you have to strain to urinate?: No Blood in urine?: No Urinary tract infection?: No Sexually transmitted disease?: No Injury to kidneys or bladder?: No Painful intercourse?: No Weak stream?: No Erection problems?: Yes Penile pain?: No  Gastrointestinal Nausea?: No Vomiting?: No Indigestion/heartburn?: No Diarrhea?: No Constipation?: No  Constitutional Fever: No Night sweats?: No Weight loss?: No Fatigue?: No  Skin Skin rash/lesions?: No Itching?: No  Eyes Blurred vision?: No Double vision?: No  Ears/Nose/Throat Sore throat?: No Sinus problems?: No  Hematologic/Lymphatic Swollen glands?: No Easy bruising?: No  Cardiovascular Leg swelling?: No Chest pain?: No  Respiratory Cough?: No Shortness of breath?: No  Endocrine Excessive thirst?: No  Musculoskeletal Back pain?: No Joint pain?: No  Neurological Headaches?: No Dizziness?: No  Psychologic Depression?: No Anxiety?: No  Physical Exam: BP (!) 169/71 (BP Location: Left Arm, Patient Position: Sitting, Cuff Size: Normal)   Pulse 86   Ht 6' (1.829 m)   Wt 185 lb 14.4 oz (84.3 kg)   BMI 25.21 kg/m   Constitutional:  Alert and oriented, No acute distress. HEENT: [place] AT, moist mucus membranes.  Trachea midline, no masses. Cardiovascular: No clubbing, cyanosis, or edema. Respiratory: Normal respiratory effort, no increased work of breathing. GI: Abdomen is soft, nontender, nondistended, no abdominal masses GU: No CVA tenderness Lymph: No cervical or inguinal lymphadenopathy. Skin: No rashes, bruises or suspicious lesions. Neurologic: Grossly intact, no focal deficits, moving all 4 extremities. Psychiatric:  Normal mood and affect.   Assessment & Plan:    1. Prostate cancer [facility]) Doing well status post EBRT+ADT.  He received a Lupron injection today and will follow-up in 6 months for a repeat injection.  2.  Erectile dysfunction He has failed PDE 5 inhibitor therapy.  Discussed vacuum erection devices and intracavernosal injections.  He inquired about penile prosthesis however he will need to be further out from his radiation.  He was interested in pursuing intracavernosal injections.  He will follow-up with [facility] for test injection/training.  We discussed compounded preparations versus Edex.  He has elected Edex.  Lupron 45mg  IM was given today   Return in about 1 year (around 01/20/2019) for Recheck, PSA.   Abbie Sons, [place] [address], [place], [place] [address] [phone]

## 2018-02-03 ENCOUNTER — Encounter: Payer: Self-pay | Admitting: Radiation Oncology

## 2018-02-13 ENCOUNTER — Telehealth: Payer: Self-pay | Admitting: Urology

## 2018-02-13 NOTE — Telephone Encounter (Signed)
Pt brought in a sheet for Dr Bernardo Heater to fill out with a self addressed stamped envelope.  Do you know if this has been mailed back to pt yet?  He called today asking.

## 2018-02-13 NOTE — Telephone Encounter (Signed)
Have you completed this yet? I put it on your desk. Thanks

## 2018-02-14 NOTE — Telephone Encounter (Signed)
Mr Franko called back and asked if this had been done yet?

## 2018-02-15 NOTE — Telephone Encounter (Signed)
It has been completed.

## 2018-02-24 ENCOUNTER — Ambulatory Visit: Payer: PPO | Admitting: Urology

## 2018-03-21 DIAGNOSIS — Z79899 Other long term (current) drug therapy: Secondary | ICD-10-CM | POA: Diagnosis not present

## 2018-03-21 DIAGNOSIS — C61 Malignant neoplasm of prostate: Secondary | ICD-10-CM | POA: Diagnosis not present

## 2018-03-21 DIAGNOSIS — I10 Essential (primary) hypertension: Secondary | ICD-10-CM | POA: Diagnosis not present

## 2018-03-21 DIAGNOSIS — Z125 Encounter for screening for malignant neoplasm of prostate: Secondary | ICD-10-CM | POA: Diagnosis not present

## 2018-03-21 DIAGNOSIS — D649 Anemia, unspecified: Secondary | ICD-10-CM | POA: Diagnosis not present

## 2018-03-21 DIAGNOSIS — R739 Hyperglycemia, unspecified: Secondary | ICD-10-CM | POA: Diagnosis not present

## 2018-03-28 DIAGNOSIS — Z0000 Encounter for general adult medical examination without abnormal findings: Secondary | ICD-10-CM | POA: Diagnosis not present

## 2018-04-05 ENCOUNTER — Telehealth: Payer: Self-pay | Admitting: Urology

## 2018-04-05 NOTE — Telephone Encounter (Signed)
APPROVED  07-18-18 THRU 10-16-18 PA# [id number] FOR [id number] 04-04-18 MICHELLE

## 2018-07-05 DIAGNOSIS — L570 Actinic keratosis: Secondary | ICD-10-CM | POA: Diagnosis not present

## 2018-07-05 DIAGNOSIS — X32XXXA Exposure to sunlight, initial encounter: Secondary | ICD-10-CM | POA: Diagnosis not present

## 2018-07-05 DIAGNOSIS — L821 Other seborrheic keratosis: Secondary | ICD-10-CM | POA: Diagnosis not present

## 2018-07-06 ENCOUNTER — Other Ambulatory Visit: Payer: Self-pay

## 2018-07-07 ENCOUNTER — Inpatient Hospital Stay: Payer: PPO | Attending: Radiation Oncology

## 2018-07-07 ENCOUNTER — Other Ambulatory Visit: Payer: Self-pay

## 2018-07-07 DIAGNOSIS — C61 Malignant neoplasm of prostate: Secondary | ICD-10-CM | POA: Diagnosis not present

## 2018-07-07 LAB — PSA: Prostatic Specific Antigen: <0.01 ng/mL (ref 0.00–4.00)

## 2018-07-14 ENCOUNTER — Ambulatory Visit
Admission: RE | Admit: 2018-07-14 | Discharge: 2018-07-14 | Disposition: A | Discharge status: Home / Self Care | Payer: PPO | Source: Ambulatory Visit | Attending: Radiation Oncology | Admitting: Radiation Oncology

## 2018-07-14 ENCOUNTER — Encounter: Payer: Self-pay | Admitting: Radiation Oncology

## 2018-07-14 ENCOUNTER — Other Ambulatory Visit: Payer: Self-pay

## 2018-07-14 DIAGNOSIS — Z923 Personal history of irradiation: Secondary | ICD-10-CM | POA: Insufficient documentation

## 2018-07-14 DIAGNOSIS — N529 Male erectile dysfunction, unspecified: Secondary | ICD-10-CM | POA: Diagnosis not present

## 2018-07-14 DIAGNOSIS — C61 Malignant neoplasm of prostate: Secondary | ICD-10-CM | POA: Diagnosis not present

## 2018-07-14 NOTE — Progress Notes (Signed)
Radiation Oncology Follow up Note  Name: Leonard Stokes   Date:   07/14/2018 MRN:  [id number] DOB: Jan 02, 1946    This 73 y.o. male presents to the clinic today for 73-month follow-up status post IMRT radiation therapy to his prostate and pelvic nodes as well as androgen deprivation therapy for stage IV (Gleason 9 (4+5) presenting with pelvic lymphadenopathy.Leonard Stokes PROVIDER: Derinda Late, MD  HPI: Patient is a 73 year old male now about 10 months having completed IMRT radiation therapy for locally advanced Gleason 9 (4+5) adenocarcinoma the prostate presenting with a PSA of 4.8.  He is seen today in routine follow-up is doing well his only complaint is erectile dysfunction he is considering a penile implant which I have discouraged.  He continues on androgen deprivation therapy.  His most recent PSA is less than 0.01.  He specifically denies diarrhea dysuria or any other GI/GU complaints..  COMPLICATIONS OF TREATMENT: none  FOLLOW UP COMPLIANCE: keeps appointments   PHYSICAL EXAM:  BP (!) (P) 141/86 (BP Location: Left Arm, Patient Position: Sitting)   Pulse (P) 69   Temp (!) (P) 95.5 F (35.3 C) (Tympanic)   Resp (P) 18   Wt (P) 181 lb 14.1 oz (82.5 kg)   BMI (P) 24.67 kg/m  Well-developed well-nourished patient in NAD. HEENT reveals PERLA, EOMI, discs not visualized.  Oral cavity is clear. No oral mucosal lesions are identified. Neck is clear without evidence of cervical or supraclavicular adenopathy. Lungs are clear to A&P. Cardiac examination is essentially unremarkable with regular rate and rhythm without murmur rub or thrill. Abdomen is benign with no organomegaly or masses noted. Motor sensory and DTR levels are equal and symmetric in the upper and lower extremities. Cranial nerves II through XII are grossly intact. Proprioception is intact. No peripheral adenopathy or edema is identified. No motor or sensory levels are noted. Crude visual fields are within normal  range.  RADIOLOGY RESULTS: No current films to review  PLAN: Present time he is doing well under excellent biochemical control of his prostate cancer we had a discussion today about issues with erectile dysfunction I have asked him to wait till he finishes with Lupron to decide on a course of action.  We have also discussions about continuing on Lupron therapy which I strongly recommend.  Otherwise have asked to see him back in 6 months for follow-up.  Patient knows to call sooner with any complaints.  He continues close follow-up care with urology.  I would like to take this opportunity to thank you for allowing me to participate in the care of your patient.Leonard Filbert, MD

## 2018-07-21 ENCOUNTER — Ambulatory Visit (INDEPENDENT_AMBULATORY_CARE_PROVIDER_SITE_OTHER): Payer: PPO

## 2018-07-21 ENCOUNTER — Other Ambulatory Visit: Payer: Self-pay

## 2018-07-21 DIAGNOSIS — C61 Malignant neoplasm of prostate: Secondary | ICD-10-CM

## 2018-07-21 MED ORDER — LEUPROLIDE ACETATE (6 MONTH) 45 MG IM KIT
45.0000 mg | PACK | Freq: Once | INTRAMUSCULAR | Status: AC
  Administered 2018-07-21: 45 mg via INTRAMUSCULAR

## 2018-07-21 NOTE — Progress Notes (Signed)
Lupron IM Injection   Due to Prostate Cancer patient is present today for a Lupron Injection.  Medication: Lupron 6 month Dose: 45 mg  Location: right upper outer buttocks Lot: [id number] Exp: 07/04/2020  Patient tolerated well, no complications were noted  Performed by: Shawnie Dapper, CMA  Follow up: as scheduled

## 2018-07-21 NOTE — Patient Instructions (Signed)
Begin taking Vitamin D and Calcium  Leuprolide injection What is this medicine? LEUPROLIDE (loo PROE lide) is a man-made hormone. It is used to treat the symptoms of prostate cancer. This medicine may also be used to treat children with early onset of puberty. It may be used for other hormonal conditions. This medicine may be used for other purposes; ask your health care provider or pharmacist if you have questions. COMMON BRAND NAME(S): Lupron What should I tell my health care provider before I take this medicine? They need to know if you have any of these conditions: -diabetes -heart disease or previous heart attack -high blood pressure -high cholesterol -pain or difficulty passing urine -spinal cord metastasis -stroke -tobacco smoker -an unusual or allergic reaction to leuprolide, benzyl alcohol, other medicines, foods, dyes, or preservatives -pregnant or trying to get pregnant -breast-feeding How should I use this medicine? This medicine is for injection under the skin or into a muscle. You will be taught how to prepare and give this medicine. Use exactly as directed. Take your medicine at regular intervals. Do not take your medicine more often than directed. It is important that you put your used needles and syringes in a special sharps container. Do not put them in a trash can. If you do not have a sharps container, call your pharmacist or healthcare provider to get one. A special MedGuide will be given to you by the pharmacist with each prescription and refill. Be sure to read this information carefully each time. Talk to your pediatrician regarding the use of this medicine in children. While this medicine may be prescribed for children as young as 8 years for selected conditions, precautions do apply. Overdosage: If you think you have taken too much of this medicine contact a poison control center or emergency room at once. NOTE: This medicine is only for you. Do not share this  medicine with others. What if I miss a dose? If you miss a dose, take it as soon as you can. If it is almost time for your next dose, take only that dose. Do not take double or extra doses. What may interact with this medicine? Do not take this medicine with any of the following medications: -chasteberry This medicine may also interact with the following medications: -herbal or dietary supplements, like black cohosh or DHEA -male hormones, like estrogens or progestins and birth control pills, patches, rings, or injections -male hormones, like testosterone This list may not describe all possible interactions. Give your health care provider a list of all the medicines, herbs, non-prescription drugs, or dietary supplements you use. Also tell them if you smoke, drink alcohol, or use illegal drugs. Some items may interact with your medicine. What should I watch for while using this medicine? Visit your doctor or health care professional for regular checks on your progress. During the first week, your symptoms may get worse, but then will improve as you continue your treatment. You may get hot flashes, increased bone pain, increased difficulty passing urine, or an aggravation of nerve symptoms. Discuss these effects with your doctor or health care professional, some of them may improve with continued use of this medicine. Male patients may experience a menstrual cycle or spotting during the first 2 months of therapy with this medicine. If this continues, contact your doctor or health care professional. What side effects may I notice from receiving this medicine? Side effects that you should report to your doctor or health care professional as soon as possible: -  allergic reactions like skin rash, itching or hives, swelling of the face, lips, or tongue -breathing problems -chest pain -depression or memory disorders -pain in your legs or groin -pain at site where injected -severe headache -swelling  of the feet and legs -visual changes -vomiting Side effects that usually do not require medical attention (report to your doctor or health care professional if they continue or are bothersome): -breast swelling or tenderness -decrease in sex drive or performance -diarrhea -hot flashes -loss of appetite -muscle, joint, or bone pains -nausea -redness or irritation at site where injected -skin problems or acne This list may not describe all possible side effects. Call your doctor for medical advice about side effects. You may report side effects to FDA at 1-800-FDA-1088. Where should I keep my medicine? Keep out of the reach of children. Store below 25 degrees C (77 degrees F). Do not freeze. Protect from light. Do not use if it is not clear or if there are particles present. Throw away any unused medicine after the expiration date. NOTE: This sheet is a summary. It may not cover all possible information. If you have questions about this medicine, talk to your doctor, pharmacist, or health care provider.  2019 Elsevier/Gold Standard (2015-09-08 10:54:35)

## 2018-09-12 DIAGNOSIS — R739 Hyperglycemia, unspecified: Secondary | ICD-10-CM | POA: Diagnosis not present

## 2018-09-12 DIAGNOSIS — I10 Essential (primary) hypertension: Secondary | ICD-10-CM | POA: Diagnosis not present

## 2018-09-12 DIAGNOSIS — Z79899 Other long term (current) drug therapy: Secondary | ICD-10-CM | POA: Diagnosis not present

## 2018-09-19 DIAGNOSIS — C61 Malignant neoplasm of prostate: Secondary | ICD-10-CM | POA: Diagnosis not present

## 2018-09-19 DIAGNOSIS — R739 Hyperglycemia, unspecified: Secondary | ICD-10-CM | POA: Diagnosis not present

## 2018-09-19 DIAGNOSIS — Z79899 Other long term (current) drug therapy: Secondary | ICD-10-CM | POA: Diagnosis not present

## 2018-09-19 DIAGNOSIS — N401 Enlarged prostate with lower urinary tract symptoms: Secondary | ICD-10-CM | POA: Diagnosis not present

## 2018-09-19 DIAGNOSIS — F5101 Primary insomnia: Secondary | ICD-10-CM | POA: Diagnosis not present

## 2018-09-19 DIAGNOSIS — Z125 Encounter for screening for malignant neoplasm of prostate: Secondary | ICD-10-CM | POA: Diagnosis not present

## 2018-09-19 DIAGNOSIS — I10 Essential (primary) hypertension: Secondary | ICD-10-CM | POA: Diagnosis not present

## 2018-09-19 DIAGNOSIS — R351 Nocturia: Secondary | ICD-10-CM | POA: Diagnosis not present

## 2018-10-15 ENCOUNTER — Encounter: Payer: Self-pay | Admitting: Emergency Medicine

## 2018-10-15 ENCOUNTER — Emergency Department: Payer: PPO

## 2018-10-15 ENCOUNTER — Emergency Department
Admission: EM | Admit: 2018-10-15 | Discharge: 2018-10-15 | Disposition: A | Discharge status: Home / Self Care | Payer: PPO | Attending: Student | Admitting: Student

## 2018-10-15 ENCOUNTER — Other Ambulatory Visit: Payer: Self-pay

## 2018-10-15 DIAGNOSIS — I10 Essential (primary) hypertension: Secondary | ICD-10-CM | POA: Diagnosis not present

## 2018-10-15 DIAGNOSIS — M549 Dorsalgia, unspecified: Secondary | ICD-10-CM | POA: Diagnosis not present

## 2018-10-15 DIAGNOSIS — N189 Chronic kidney disease, unspecified: Secondary | ICD-10-CM | POA: Diagnosis not present

## 2018-10-15 DIAGNOSIS — Z8546 Personal history of malignant neoplasm of prostate: Secondary | ICD-10-CM | POA: Insufficient documentation

## 2018-10-15 DIAGNOSIS — Z87891 Personal history of nicotine dependence: Secondary | ICD-10-CM | POA: Insufficient documentation

## 2018-10-15 DIAGNOSIS — Z79899 Other long term (current) drug therapy: Secondary | ICD-10-CM | POA: Insufficient documentation

## 2018-10-15 DIAGNOSIS — M546 Pain in thoracic spine: Secondary | ICD-10-CM | POA: Diagnosis not present

## 2018-10-15 DIAGNOSIS — I129 Hypertensive chronic kidney disease with stage 1 through stage 4 chronic kidney disease, or unspecified chronic kidney disease: Secondary | ICD-10-CM | POA: Insufficient documentation

## 2018-10-15 DIAGNOSIS — R079 Chest pain, unspecified: Secondary | ICD-10-CM | POA: Diagnosis not present

## 2018-10-15 LAB — CBC
HCT: 33.2 % — ABNORMAL LOW (ref 39.0–52.0)
Hemoglobin: 11.5 g/dL — ABNORMAL LOW (ref 13.0–17.0)
MCH: 33.2 pg (ref 26.0–34.0)
MCHC: 34.6 g/dL (ref 30.0–36.0)
MCV: 96 fL (ref 80.0–100.0)
Platelets: 211 10*3/uL (ref 150–400)
RBC: 3.46 MIL/uL — ABNORMAL LOW (ref 4.22–5.81)
RDW: 13.2 % (ref 11.5–15.5)
WBC: 3.9 10*3/uL — ABNORMAL LOW (ref 4.0–10.5)
nRBC: 0 % (ref 0.0–0.2)

## 2018-10-15 LAB — BASIC METABOLIC PANEL
Anion gap: 9 (ref 5–15)
BUN: 24 mg/dL — ABNORMAL HIGH (ref 8–23)
CO2: 24 mmol/L (ref 22–32)
Calcium: 9.6 mg/dL (ref 8.9–10.3)
Chloride: 105 mmol/L (ref 98–111)
Creatinine, Ser: 0.9 mg/dL (ref 0.61–1.24)
GFR calc Af Amer: >60 mL/min (ref >60)
GFR calc non Af Amer: >60 mL/min (ref >60)
Glucose, Bld: 107 mg/dL — ABNORMAL HIGH (ref 70–99)
Potassium: 4.4 mmol/L (ref 3.5–5.1)
Sodium: 138 mmol/L (ref 135–145)

## 2018-10-15 LAB — TROPONIN I (HIGH SENSITIVITY)
Troponin I (High Sensitivity): 6 ng/L (ref <18)
Troponin I (High Sensitivity): 5 ng/L (ref <18)

## 2018-10-15 MED ORDER — DICLOFENAC SODIUM 1 % TD GEL: 2.0000 g | Freq: Four times a day (QID) | TRANSDERMAL | 0 refills | Status: DC

## 2018-10-15 MED ORDER — CYCLOBENZAPRINE HCL 7.5 MG PO TABS: 7.5000 mg | ORAL_TABLET | Freq: Three times a day (TID) | ORAL | 0 refills | Status: DC | PRN

## 2018-10-15 MED ORDER — SODIUM CHLORIDE 0.9% FLUSH: 3.0000 mL | Freq: Once | INTRAVENOUS | Status: DC

## 2018-10-15 NOTE — ED Triage Notes (Addendum)
Pt to ED with c/o of upper back and bilateral shoulder pain with sudden onset. Pt states also sweating, lightheadedness but denies N/V. Pt has hx of kidney stones and states pain is similar.

## 2018-10-15 NOTE — ED Notes (Signed)
Pt up walking around the room; waiting on results and MD followup; no complaints or requests at this time; wife present

## 2018-10-15 NOTE — ED Provider Notes (Signed)
[facility] Emergency Department Provider Note  ____________________________________________   First MD Initiated Contact with Patient 10/15/18 1749     (approximate)  I have reviewed the triage vital signs and the nursing notes.  History  Chief Complaint Back Pain    HPI Leonard Stokes is a 73 y.o. male with history of prostate cancer who presents to the ED for an episode of upper/mid thoracic muscular back pain. Localizes to the mid thoracic back, bilateral, paraspinal/lateral. No midline or bony component. He states symptoms came on while gardening. Difficult to characterize, but describes it as a "building" sensation. Currently he is without symptoms. He denies any midline back pain. No associated chest pain, SOB, or difficulty breathing. No nausea or vomiting. He took a muscle relaxant at home which seemed to help the severity. No LE weakness, numbness, or tingling. He denies any heavy lifting or trauma related. No rashes or lesions.   He has a history of kidney stones but states this feels different, because it is higher, bilateral, and does not radiate to his groin. No urinary symptoms.          Past Medical Hx Past Medical History:  Diagnosis Date  . Anemia   . Arthritis   . Atherosclerosis of aorta ([place]) 2018  . BPH (benign prostatic hyperplasia)   . Cancer [facility]) 2019   prostate  . Chronic kidney disease 08/2016   kidney stone  . Diverticulosis of sigmoid colon   . History of kidney stones   . Hypertension   . Leg cramps     Problem List Patient Active Problem List   Diagnosis Date Noted  . Prostate cancer metastatic to intrapelvic lymph node ([place]) 12/01/2017  . Right inguinal hernia 04/15/2017  . Hypertension 11/30/2016  . 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 Personal history of tobacco use, presenting hazards to health 09/26/2014    Past Surgical Hx Past Surgical History:  Procedure Laterality Date  . APPENDECTOMY    .  EXTRACORPOREAL SHOCK WAVE LITHOTRIPSY Right 08/19/2016   Procedure: EXTRACORPOREAL SHOCK WAVE LITHOTRIPSY (ESWL);  Surgeon: [name], MD;  Location: [facility] ORS;  Service: Urology;  Laterality: Right;  . EXTRACORPOREAL SHOCK WAVE LITHOTRIPSY Left 03/24/2017   Procedure: EXTRACORPOREAL SHOCK WAVE LITHOTRIPSY (ESWL);  Surgeon: [name], MD;  Location: [facility] ORS;  Service: Urology;  Laterality: Left;  . EYE MUSCLE SURGERY    . INGUINAL HERNIA REPAIR Right 12/23/2017   Direct hernia, medium Ultra Pro mesh;  Surgeon: Robert Bellow, MD;  Location: [facility] ORS;  Service: General;  Laterality: Right;  . JOINT REPLACEMENT    . KNEE ARTHROPLASTY Left 11/15/2016   Procedure: COMPUTER ASSISTED TOTAL KNEE ARTHROPLASTY;  Surgeon: Dereck Leep, MD;  Location: [facility] ORS;  Service: Orthopedics;  Laterality: Left;  . TOTAL KNEE ARTHROPLASTY Right     Medications Prior to Admission medications   Medication Sig Start Date End Date Taking? Authorizing Provider  acetaminophen (TYLENOL) 500 MG tablet Take 1,000 mg by mouth every 6 (six) hours as needed for moderate pain.     [provider]  Ascorbic Acid (VITAMIN C) 1000 MG tablet Take 1,000 mg by mouth daily.    [provider]  B Complex Vitamins (B COMPLEX-B12 PO) Take 1 tablet by mouth daily.    [provider]  Calcium Acetate, Phos Binder, (CALCIUM ACETATE PO) Take 2 tablets by mouth daily.    [provider]  Cholecalciferol (VITAMIN D-3) 125 MCG (5000 UT) TABS  Take 5,000 Units by mouth daily.    [provider]  COD LIVER OIL PO Take 415 mg by mouth daily.     [provider]  cyclobenzaprine (FEXMID) 7.5 MG tablet Take 1 tablet (7.5 mg total) by mouth 3 (three) times daily as needed for muscle spasms. 10/15/18   Lilia Pro., MD  diclofenac sodium (VOLTAREN) 1 % GEL Apply 2 g topically 4 (four) times daily. 10/15/18   Lilia Pro., MD  ferrous sulfate 325 (65 FE) MG tablet Take 325 mg  by mouth daily with breakfast.    [provider]  hydrochlorothiazide (HYDRODIURIL) 12.5 MG tablet  12/19/17   [provider]  losartan-hydrochlorothiazide (HYZAAR) 50-12.5 MG tablet Take 1 tablet by mouth every morning.  08/02/16   [provider]  meloxicam (MOBIC) 15 MG tablet Take 15 mg by mouth daily.     [provider]  Misc Natural Products (OSTEO BI-FLEX TRIPLE STRENGTH PO) Take 1 tablet by mouth daily.     [provider]  Multiple Vitamins-Minerals (SENIOR MULTIVITAMIN PLUS PO) Take 1 tablet by mouth daily.    [provider]  sildenafil (REVATIO) 20 MG tablet Take 20-100 mg by mouth daily as needed (for ED).     [provider]  tamsulosin (FLOMAX) 0.4 MG CAPS capsule Take 1 capsule (0.4 mg total) by mouth daily. Patient taking differently: Take 0.4 mg by mouth daily after breakfast.  03/24/17   [name], MD  TURMERIC CURCUMIN PO Take 1,000 mg by mouth daily.     [provider]  zolpidem (AMBIEN) 10 MG tablet Take 10 mg by mouth at bedtime as needed for sleep.  03/22/16   [provider]    Allergies Codeine  Family Hx History reviewed. No pertinent family history.  Social Hx Social History   Tobacco Use  . Smoking status: Former Smoker    Packs/day: 3.00    Years: 20.00    Pack years: 60.00    Types: Cigarettes    Quit date: 05/31/1996    Years since quitting: 22.3  . Smokeless tobacco: Never Used  Substance Use Topics  . Alcohol use: Yes    Comment: 4-5 week  . Drug use: No     Review of Systems  Constitutional: Negative for fever. Negative for chills. Eyes: Negative for visual changes. ENT: Negative for sore throat. Cardiovascular: Negative for chest pain. Respiratory: Negative for shortness of breath. Gastrointestinal: Negative for abdominal pain. Negative for nausea. Negative for vomiting. Genitourinary: Negative for dysuria. Musculoskeletal: Negative for leg swelling.  + back pain Skin: Negative for rash. Neurological: Negative for for headaches.   Physical Exam  Vital Signs: ED Triage Vitals  Enc Vitals Group     BP 10/15/18 1622 (!) 143/90     Pulse Rate 10/15/18 1622 74     Resp 10/15/18 1622 18     Temp 10/15/18 1622 98.6 F (37 C)     Temp Source 10/15/18 1622 Oral     SpO2 10/15/18 1622 99 %     Weight 10/15/18 1624 178 lb (80.7 kg)     Height 10/15/18 1624 6' (1.829 m)     Head Circumference --      Peak Flow --      Pain Score 10/15/18 1623 2     Pain Loc --      Pain Edu? --      Excl. in [place]? --     Constitutional: Alert and  oriented.  Eyes: Conjunctivae clear. Sclera anicteric. Head: Normocephalic. Atraumatic. Nose: No congestion. No rhinorrhea. Mouth/Throat: Mucous membranes are moist.  Neck: No stridor.   Cardiovascular: Normal rate, regular rhythm. No murmurs. Extremities well perfused. Respiratory: Normal respiratory effort.  Lungs CTAB. Gastrointestinal: Soft and non-tender. No distention.  Musculoskeletal: No lower extremity edema. No paraspinal muscular tenderness.  Back: No midline C/T/L spine tenderness. FROM w/o discomfort.  Neurologic:  Normal speech and language. No gross focal neurologic deficits are appreciated. BLE strength 5/5. SILT. Ambulatory w/o issue. Skin: Skin is warm, dry and intact. No rash noted. No vesicles. Psychiatric: Mood and affect are appropriate for situation.  EKG  Personally reviewed.   Rate: 74 Rhythm: sinus Axis: normal Intervals: WNL No acute ischemic changes No STEMI    Radiology  CXR: IMPRESSION: No active cardiopulmonary disease.     Procedures  Procedure(s) performed (including critical care):  Procedures   Initial Impression / Assessment and Plan / ED Course  73 y.o. male who presents to the ED for thoracic back pain, as above.  Ddx: MSK pain, muscle spasm, atypical ACS, bony met (though less likely as his cancer has been well controlled) . No evidence of  shingles on exam. No flank pain, not colicky in nature, no urinary symptoms s/o stone. No midline tenderness s/o fracture. No associated abdominal pain or neurological symptoms suggestive of aortic pathology, and has equal and symmetric distal pulses. No LE neurological symptoms s/o cord pathology.  Plan: labs, imaging, EKG  EKG w/o ischemic changes. HS troponin x 2 negative. XR negative, no bony abnormalities. As such, will plan for DC with Voltaren gel, Flexeril as needed. He is comfortable w/ this plan. Advised PCP follow up and given return precautions.    Final Clinical Impression(s) / ED Diagnosis  Final diagnoses:  Mid back pain       Note:  This document was prepared using Dragon voice recognition software and may include unintentional dictation errors.   Lilia Pro., MD 10/15/18 [id number]

## 2018-10-15 NOTE — Discharge Instructions (Signed)
Thank you for letting us take care of you in the emergency department today.   Please continue to take any regular, prescribed medications.   New medications we have prescribed:  - Voltaren gel - anti-inflammatory to help relax with muscle pain - Flexeril - muscle relaxant, this can be sedating, so do not take with any other sedating medications or alcohol  Please follow up with: - Your primary care doctor to review your ER visit and follow up on your symptoms.   Please return to the ER for any new or worsening symptoms.

## 2018-10-19 DIAGNOSIS — Z85828 Personal history of other malignant neoplasm of skin: Secondary | ICD-10-CM | POA: Diagnosis not present

## 2018-10-19 DIAGNOSIS — L728 Other follicular cysts of the skin and subcutaneous tissue: Secondary | ICD-10-CM | POA: Diagnosis not present

## 2018-10-19 DIAGNOSIS — Z08 Encounter for follow-up examination after completed treatment for malignant neoplasm: Secondary | ICD-10-CM | POA: Diagnosis not present

## 2018-10-19 DIAGNOSIS — D2261 Melanocytic nevi of right upper limb, including shoulder: Secondary | ICD-10-CM | POA: Diagnosis not present

## 2018-10-19 DIAGNOSIS — L821 Other seborrheic keratosis: Secondary | ICD-10-CM | POA: Diagnosis not present

## 2018-10-19 DIAGNOSIS — D2262 Melanocytic nevi of left upper limb, including shoulder: Secondary | ICD-10-CM | POA: Diagnosis not present

## 2018-11-17 DIAGNOSIS — S6722XA Crushing injury of left hand, initial encounter: Secondary | ICD-10-CM | POA: Diagnosis not present

## 2018-11-17 DIAGNOSIS — S62619A Displaced fracture of proximal phalanx of unspecified finger, initial encounter for closed fracture: Secondary | ICD-10-CM | POA: Diagnosis not present

## 2018-11-17 DIAGNOSIS — S6292XA Unspecified fracture of left wrist and hand, initial encounter for closed fracture: Secondary | ICD-10-CM | POA: Diagnosis not present

## 2018-11-17 DIAGNOSIS — S62611A Displaced fracture of proximal phalanx of left index finger, initial encounter for closed fracture: Secondary | ICD-10-CM | POA: Diagnosis not present

## 2018-11-24 DIAGNOSIS — S62619A Displaced fracture of proximal phalanx of unspecified finger, initial encounter for closed fracture: Secondary | ICD-10-CM | POA: Diagnosis not present

## 2018-12-15 DIAGNOSIS — S62619A Displaced fracture of proximal phalanx of unspecified finger, initial encounter for closed fracture: Secondary | ICD-10-CM | POA: Diagnosis not present

## 2018-12-17 ENCOUNTER — Encounter: Payer: Self-pay | Admitting: Radiation Oncology

## 2018-12-20 ENCOUNTER — Telehealth: Payer: Self-pay | Admitting: Urology

## 2018-12-20 NOTE — Telephone Encounter (Signed)
NO PA REQUIRED FOR ELIGARD FOR [employer]

## 2018-12-25 DIAGNOSIS — S62619D Displaced fracture of proximal phalanx of unspecified finger, subsequent encounter for fracture with routine healing: Secondary | ICD-10-CM | POA: Diagnosis not present

## 2018-12-27 DIAGNOSIS — S62619D Displaced fracture of proximal phalanx of unspecified finger, subsequent encounter for fracture with routine healing: Secondary | ICD-10-CM | POA: Diagnosis not present

## 2019-01-01 ENCOUNTER — Other Ambulatory Visit: Payer: Self-pay

## 2019-01-01 ENCOUNTER — Inpatient Hospital Stay: Payer: PPO | Attending: Radiation Oncology

## 2019-01-01 DIAGNOSIS — C61 Malignant neoplasm of prostate: Secondary | ICD-10-CM | POA: Insufficient documentation

## 2019-01-01 DIAGNOSIS — S62619D Displaced fracture of proximal phalanx of unspecified finger, subsequent encounter for fracture with routine healing: Secondary | ICD-10-CM | POA: Diagnosis not present

## 2019-01-01 LAB — PSA: Prostatic Specific Antigen: <0.01 ng/mL (ref 0.00–4.00)

## 2019-01-03 ENCOUNTER — Inpatient Hospital Stay: Payer: PPO

## 2019-01-04 DIAGNOSIS — R070 Pain in throat: Secondary | ICD-10-CM | POA: Diagnosis not present

## 2019-01-04 DIAGNOSIS — K219 Gastro-esophageal reflux disease without esophagitis: Secondary | ICD-10-CM | POA: Diagnosis not present

## 2019-01-06 ENCOUNTER — Encounter: Payer: Self-pay | Admitting: Radiation Oncology

## 2019-01-09 ENCOUNTER — Other Ambulatory Visit: Payer: Self-pay

## 2019-01-10 ENCOUNTER — Encounter: Payer: Self-pay | Admitting: Radiation Oncology

## 2019-01-10 ENCOUNTER — Other Ambulatory Visit: Payer: Self-pay

## 2019-01-10 ENCOUNTER — Ambulatory Visit
Admission: RE | Admit: 2019-01-10 | Discharge: 2019-01-10 | Disposition: A | Discharge status: Home / Self Care | Payer: PPO | Source: Ambulatory Visit | Attending: Radiation Oncology | Admitting: Radiation Oncology

## 2019-01-10 VITALS — BP 144/92 | HR 73 | Temp 98.0°F | Resp 18 | Wt 183.6 lb

## 2019-01-10 DIAGNOSIS — C61 Malignant neoplasm of prostate: Secondary | ICD-10-CM | POA: Diagnosis not present

## 2019-01-10 DIAGNOSIS — Z923 Personal history of irradiation: Secondary | ICD-10-CM | POA: Insufficient documentation

## 2019-01-10 DIAGNOSIS — C774 Secondary and unspecified malignant neoplasm of inguinal and lower limb lymph nodes: Secondary | ICD-10-CM | POA: Diagnosis not present

## 2019-01-10 NOTE — Progress Notes (Signed)
Radiation Oncology Follow up Note  Name: Leonard Stokes   Date:   01/10/2019 MRN:  [id number] DOB: [birth year]    This 73 y.o. male presents to the clinic today for 86-month follow-up status post IMRT radiation therapy to his prostate and pelvic nodes as well as androgen deprivation therapy for stage IV Gleason 9 (4+5) adenocarcinoma presenting with pelvic lymphadenopathy.[name] PROVIDER: Derinda Late, MD  HPI: Patient is a 73 year old male now about 16 months having completed IMRT radiation therapy to his prostate and pelvic nodes as well as ADT therapy for stage IV Gleason 9 (4+5) adenocarcinoma the prostate.  Seen today in routine follow-up he is doing well.  He specifically denies any increased lower urinary tract symptoms or GI problems.  His most recent PSA is less than 0.01.  He is currently on.  Androgen deprivation therapy by urology.  COMPLICATIONS OF TREATMENT: none  FOLLOW UP COMPLIANCE: keeps appointments   PHYSICAL EXAM:  BP (!) 144/92   Pulse 73   Temp 98 F (36.7 C)   Resp 18   Wt 183 lb 9.6 oz (83.3 kg)   BMI 24.90 kg/m  Well-developed well-nourished patient in NAD. HEENT reveals PERLA, EOMI, discs not visualized.  Oral cavity is clear. No oral mucosal lesions are identified. Neck is clear without evidence of cervical or supraclavicular adenopathy. Lungs are clear to A&P. Cardiac examination is essentially unremarkable with regular rate and rhythm without murmur rub or thrill. Abdomen is benign with no organomegaly or masses noted. Motor sensory and DTR levels are equal and symmetric in the upper and lower extremities. Cranial nerves II through XII are grossly intact. Proprioception is intact. No peripheral adenopathy or edema is identified. No motor or sensory levels are noted. Crude visual fields are within normal range.  RADIOLOGY RESULTS: No current films to review  PLAN: Present time patient is under excellent biochemical control of his prostate cancer.  And  pleased with his overall progress.  I have asked to see him back in 1 year for follow-up.  He continues follow-up with urology.  I will leave it up to them to determine his length and ADT therapy.  Patient knows to call with any concerns.  I would like to take this opportunity to thank you for allowing me to participate in the care of your patient.Noreene Filbert, MD

## 2019-01-11 DIAGNOSIS — S62619D Displaced fracture of proximal phalanx of unspecified finger, subsequent encounter for fracture with routine healing: Secondary | ICD-10-CM | POA: Diagnosis not present

## 2019-01-16 ENCOUNTER — Ambulatory Visit (INDEPENDENT_AMBULATORY_CARE_PROVIDER_SITE_OTHER): Payer: PPO | Admitting: Urology

## 2019-01-16 ENCOUNTER — Encounter: Payer: Self-pay | Admitting: Urology

## 2019-01-16 ENCOUNTER — Other Ambulatory Visit: Payer: Self-pay

## 2019-01-16 VITALS — BP 148/79 | HR 88 | Ht 72.0 in | Wt 175.0 lb

## 2019-01-16 DIAGNOSIS — C61 Malignant neoplasm of prostate: Secondary | ICD-10-CM

## 2019-01-16 DIAGNOSIS — N5203 Combined arterial insufficiency and corporo-venous occlusive erectile dysfunction: Secondary | ICD-10-CM

## 2019-01-16 MED ORDER — LEUPROLIDE ACETATE (6 MONTH) 45 MG ~~LOC~~ KIT
45.0000 mg | PACK | Freq: Once | SUBCUTANEOUS | Status: AC
  Administered 2019-01-16: 45 mg via SUBCUTANEOUS

## 2019-01-16 MED ORDER — LEUPROLIDE ACETATE (6 MONTH) 45 MG ~~LOC~~ KIT: 45.0000 mg | PACK | Freq: Once | SUBCUTANEOUS | Status: DC

## 2019-01-16 MED ORDER — SILDENAFIL CITRATE 20 MG PO TABS: 20.0000 mg | ORAL_TABLET | ORAL | 11 refills | Status: DC | PRN

## 2019-01-16 NOTE — Progress Notes (Signed)
01/16/2019 8:17 PM   Leonard Stokes 1945/08/13 [id number]  Referring provider: Derinda Late, MD [phone] [place],  [place] [address]  Chief Complaint  Patient presents with  . Follow-up    HPI: 73 year old male with a personal history of high risk prostate cancer, possible pelvic lymphadenopathy status post IMRT /ADT who returns today for routine follow-up and for continuation of androgen deprivation therapy.  He was first seen for evaluation of rising PSA up to 4.84. He was also found to have a palpable nodule at the right base, T2b  He underwent prostate biopsy on 05/24/2017. TRUS volume 82g. Prostate biopsy was consistent with high risk prostate cancer, Gleason 4+5 and 4+4 involving all cores on the right up to 96% of the tissue. No left-sided involvement.  CT abdomen pelvis which revealed significant external lymphadenopathy bilaterally measuring up to 3 cm on the left and 1.8 cm in largest diameter on the right. This is not appreciated on the scan in 08/2016. Bone scan was negative for any evidence of osseous mets.  He was started on Degeralix with plans to start EBRT.  Prior to initiating EBRT, he did have a fluciclovine 4 PET scan on 06/23/2017 which showed no evidence of nodal or distant metastasis.    He completed whole pelvic EBRT which was well-tolerated.  He continues on ADT currently.  He is due for repeat Depo today.  His most recent PSA has been undetectable.  He continues to have some hot flashes.  Normal energy level.  He is walking but doing less so since the weather has turned colder.  He is concerned today about erectile function.  He has excellent libido but is having some difficulty maintaining and achieving erections.  He used Viagra in the remote past and is interested in pursuing some sort of intervention.  He asked about penile prosthesis today.   PMH: Past Medical History:  Diagnosis Date    . Anemia   . Arthritis   . Atherosclerosis of aorta ([place]) 2018  . BPH (benign prostatic hyperplasia)   . Cancer [facility]) 2019   prostate  . Chronic kidney disease 08/2016   kidney stone  . Diverticulosis of sigmoid colon   . History of kidney stones   . Hypertension   . Leg cramps     Surgical History: Past Surgical History:  Procedure Laterality Date  . APPENDECTOMY    . EXTRACORPOREAL SHOCK WAVE LITHOTRIPSY Right 08/19/2016   Procedure: EXTRACORPOREAL SHOCK WAVE LITHOTRIPSY (ESWL);  Surgeon: [name], MD;  Location: [facility] ORS;  Service: Urology;  Laterality: Right;  . EXTRACORPOREAL SHOCK WAVE LITHOTRIPSY Left 03/24/2017   Procedure: EXTRACORPOREAL SHOCK WAVE LITHOTRIPSY (ESWL);  Surgeon: [name], MD;  Location: [facility] ORS;  Service: Urology;  Laterality: Left;  . EYE MUSCLE SURGERY    . INGUINAL HERNIA REPAIR Right 12/23/2017   Direct hernia, medium Ultra Pro mesh;  Surgeon: Robert Bellow, MD;  Location: [facility] ORS;  Service: General;  Laterality: Right;  . JOINT REPLACEMENT    . KNEE ARTHROPLASTY Left 11/15/2016   Procedure: COMPUTER ASSISTED TOTAL KNEE ARTHROPLASTY;  Surgeon: Dereck Leep, MD;  Location: [facility] ORS;  Service: Orthopedics;  Laterality: Left;  . TOTAL KNEE ARTHROPLASTY Right     Home Medications:  Allergies as of 01/16/2019      Reactions   Codeine Rash      Medication List       Accurate as of January 16, 2019  8:17 PM. If you have any questions, ask your nurse or doctor.        acetaminophen 500 MG tablet Commonly known as: TYLENOL Take 1,000 mg by mouth every 6 (six) hours as needed for moderate pain.   B COMPLEX-B12 PO Take 1 tablet by mouth daily.   CALCIUM ACETATE PO Take 2 tablets by mouth daily.   COD LIVER OIL PO Take 415 mg by mouth daily.   cyclobenzaprine 7.5 MG tablet Commonly known as: FEXMID Take 1 tablet (7.5 mg total) by mouth 3 (three) times daily as needed for muscle spasms.   diclofenac sodium 1 %  Gel Commonly known as: Voltaren Apply 2 g topically 4 (four) times daily.   ferrous sulfate 325 (65 FE) MG tablet Take 325 mg by mouth daily with breakfast.   hydrochlorothiazide 12.5 MG tablet Commonly known as: HYDRODIURIL   losartan-hydrochlorothiazide 50-12.5 MG tablet Commonly known as: HYZAAR Take 1 tablet by mouth every morning.   meloxicam 15 MG tablet Commonly known as: MOBIC Take 15 mg by mouth daily.   OSTEO BI-FLEX TRIPLE STRENGTH PO Take 1 tablet by mouth daily.   SENIOR MULTIVITAMIN PLUS PO Take 1 tablet by mouth daily.   sildenafil 20 MG tablet Commonly known as: REVATIO Take 20-100 mg by mouth daily as needed (for ED). What changed: Another medication with the same name was added. Make sure you understand how and when to take each. Changed by: [name], MD   sildenafil 20 MG tablet Commonly known as: Revatio Take 1 tablet (20 mg total) by mouth as needed. Take 1-5 tabs as needed prior to intercourse What changed: You were already taking a medication with the same name, and this prescription was added. Make sure you understand how and when to take each. Changed by: [name], MD   tamsulosin 0.4 MG Caps capsule Commonly known as: Flomax Take 1 capsule (0.4 mg total) by mouth daily. What changed: when to take this   TURMERIC CURCUMIN PO Take 1,000 mg by mouth daily.   vitamin C 1000 MG tablet Take 1,000 mg by mouth daily.   Vitamin D-3 125 MCG (5000 UT) Tabs Take 5,000 Units by mouth daily.   zolpidem 10 MG tablet Commonly known as: AMBIEN Take 10 mg by mouth at bedtime as needed for sleep.       Allergies:  Allergies  Allergen Reactions  . Codeine Rash    Family History: No family history on file.  Social History:  reports that he quit smoking about 22 years ago. His smoking use included cigarettes. He has a 60.00 pack-year smoking history. He has never used smokeless tobacco. He reports current alcohol use. He reports that  he does not use drugs.  ROS: UROLOGY Frequent Urination?: Yes Hard to postpone urination?: No Burning/pain with urination?: No Get up at night to urinate?: Yes Leakage of urine?: No Urine stream starts and stops?: Yes Trouble starting stream?: No Do you have to strain to urinate?: Yes Blood in urine?: No Urinary tract infection?: No Sexually transmitted disease?: No Injury to kidneys or bladder?: No Painful intercourse?: No Weak stream?: Yes Erection problems?: No Penile pain?: No  Gastrointestinal Nausea?: No Vomiting?: No Indigestion/heartburn?: No Diarrhea?: No Constipation?: No  Constitutional Fever: No Night sweats?: No Weight loss?: No Fatigue?: No  Skin Skin rash/lesions?: No Itching?: No  Eyes Blurred vision?: No Double vision?: No  Ears/Nose/Throat Sore throat?: No Sinus problems?: No  Hematologic/Lymphatic Swollen glands?: No Easy bruising?:  No  Cardiovascular Leg swelling?: No Chest pain?: No  Respiratory Cough?: No Shortness of breath?: No  Endocrine Excessive thirst?: No  Musculoskeletal Back pain?: No Joint pain?: No  Neurological Headaches?: No Dizziness?: No  Psychologic Depression?: No Anxiety?: No  Physical Exam: BP (!) 148/79   Pulse 88   Ht 6' (1.829 m)   Wt 175 lb (79.4 kg)   BMI 23.73 kg/m   Constitutional:  Alert and oriented, No acute distress. HEENT: [place] AT, moist mucus membranes.  Trachea midline, no masses. Cardiovascular: No clubbing, cyanosis, or edema. Respiratory: Normal respiratory effort, no increased work of breathing. Skin: No rashes, bruises or suspicious lesions. Neurologic: Grossly intact, no focal deficits, moving all 4 extremities. Psychiatric: Normal mood and affect.  Laboratory Data: Lab Results  Component Value Date   WBC 3.9 (L) 10/15/2018   HGB 11.5 (L) 10/15/2018   HCT 33.2 (L) 10/15/2018   MCV 96.0 10/15/2018   PLT 211 10/15/2018    Lab Results  Component Value Date    CREATININE 0.90 10/15/2018   Component     Latest Ref Rng & Units 01/06/2018 07/07/2018 01/01/2019  Prostatic Specific Antigen     0.00 - 4.00 ng/mL 0.01 <0.01 <0.01    Assessment & Plan:    1. Prostate cancer [facility]) Personal history of high risk prostate cancer status post IMRT  I have recommended continuation of ADT for at least 2 to 3 years which would be at least another year.  PSA is currently undetectable  He is agreeable this plan.  Continue to remind him of weightbearing exercise is and bone health.  - leuprolide (6 Month) (ELIGARD) injection 45 mg - PSA; Future  2. Combined arterial insufficiency and corporo-venous occlusive erectile dysfunction We discussed the pathophysiology of erectile dysfunction today along with possible congestive any factors. Discussed possible treatment options including PDE 5 inhibitors, vacuum erectile device, intracavernosal injection, MUSE, and placement of the inflatable or malleable penal prosthesis for refractory cases.  In terms of PDE 5 inhibitors, we discussed contraindications for this medication as well as common side effects. Patient was counseled on optimal use. All of his questions were answered in detail.   Return in about 6 months (around 07/17/2019) for Lupron/ PSA.  [name], MD  [facility] [address] [place], [place] [address] [phone]  I spent 25 min with this patient of which greater than 50% was spent in counseling and coordination of care with the patient.

## 2019-01-16 NOTE — Progress Notes (Deleted)
   01/16/19  CC:  Chief Complaint  Patient presents with  . Follow-up    HPI:   Blood pressure (!) 148/79, pulse 88, height 6' (1.829 m), weight 175 lb (79.4 kg). NED. A&Ox3.   No respiratory distress   Abd soft, NT, ND Normal phallus with bilateral descended testicles  Cystoscopy Procedure Note  Patient identification was confirmed, informed consent was obtained, and patient was prepped using Betadine solution.  Lidocaine jelly was administered per urethral meatus.     Pre-Procedure: - Inspection reveals a normal caliber ureteral meatus.  Procedure: The flexible cystoscope was introduced without difficulty - No urethral strictures/lesions are present. - {Blank multiple:19197::"Enlarged","Surgically absent","Normal"} prostate *** - {Blank multiple:19197::"Normal","Elevated","Tight"} bladder neck - Bilateral ureteral orifices identified - Bladder mucosa  reveals no ulcers, tumors, or lesions - No bladder stones - No trabeculation  Retroflexion shows ***   Post-Procedure: - Patient tolerated the procedure well  Assessment/ Plan:   No follow-ups on file.  [name], MD

## 2019-01-17 ENCOUNTER — Other Ambulatory Visit: Payer: Self-pay

## 2019-01-17 ENCOUNTER — Ambulatory Visit: Payer: PPO | Attending: Internal Medicine

## 2019-01-17 DIAGNOSIS — Z20822 Contact with and (suspected) exposure to covid-19: Secondary | ICD-10-CM

## 2019-01-17 DIAGNOSIS — S62619D Displaced fracture of proximal phalanx of unspecified finger, subsequent encounter for fracture with routine healing: Secondary | ICD-10-CM | POA: Diagnosis not present

## 2019-01-17 DIAGNOSIS — Z20828 Contact with and (suspected) exposure to other viral communicable diseases: Secondary | ICD-10-CM | POA: Diagnosis not present

## 2019-01-18 DIAGNOSIS — S62619D Displaced fracture of proximal phalanx of unspecified finger, subsequent encounter for fracture with routine healing: Secondary | ICD-10-CM | POA: Diagnosis not present

## 2019-01-18 LAB — NOVEL CORONAVIRUS, NAA: SARS-CoV-2, NAA: NOT DETECTED

## 2019-01-22 DIAGNOSIS — S62619D Displaced fracture of proximal phalanx of unspecified finger, subsequent encounter for fracture with routine healing: Secondary | ICD-10-CM | POA: Diagnosis not present

## 2019-01-24 ENCOUNTER — Ambulatory Visit: Payer: PPO | Admitting: Urology

## 2019-01-28 ENCOUNTER — Encounter: Payer: Self-pay | Admitting: Urology

## 2019-01-28 DIAGNOSIS — N5235 Erectile dysfunction following radiation therapy: Secondary | ICD-10-CM

## 2019-02-09 DIAGNOSIS — S62619D Displaced fracture of proximal phalanx of unspecified finger, subsequent encounter for fracture with routine healing: Secondary | ICD-10-CM | POA: Diagnosis not present

## 2019-02-10 ENCOUNTER — Encounter: Payer: Self-pay | Admitting: Urology

## 2019-02-23 DIAGNOSIS — S62619D Displaced fracture of proximal phalanx of unspecified finger, subsequent encounter for fracture with routine healing: Secondary | ICD-10-CM | POA: Diagnosis not present

## 2019-02-28 DIAGNOSIS — Z8546 Personal history of malignant neoplasm of prostate: Secondary | ICD-10-CM | POA: Diagnosis not present

## 2019-02-28 DIAGNOSIS — N5201 Erectile dysfunction due to arterial insufficiency: Secondary | ICD-10-CM | POA: Diagnosis not present

## 2019-03-21 DIAGNOSIS — N5201 Erectile dysfunction due to arterial insufficiency: Secondary | ICD-10-CM | POA: Diagnosis not present

## 2019-03-23 DIAGNOSIS — I10 Essential (primary) hypertension: Secondary | ICD-10-CM | POA: Diagnosis not present

## 2019-03-23 DIAGNOSIS — Z79899 Other long term (current) drug therapy: Secondary | ICD-10-CM | POA: Diagnosis not present

## 2019-03-23 DIAGNOSIS — Z125 Encounter for screening for malignant neoplasm of prostate: Secondary | ICD-10-CM | POA: Diagnosis not present

## 2019-03-23 DIAGNOSIS — R739 Hyperglycemia, unspecified: Secondary | ICD-10-CM | POA: Diagnosis not present

## 2019-03-30 DIAGNOSIS — Z1211 Encounter for screening for malignant neoplasm of colon: Secondary | ICD-10-CM | POA: Diagnosis not present

## 2019-03-30 DIAGNOSIS — Z0000 Encounter for general adult medical examination without abnormal findings: Secondary | ICD-10-CM | POA: Diagnosis not present

## 2019-03-30 DIAGNOSIS — I10 Essential (primary) hypertension: Secondary | ICD-10-CM | POA: Diagnosis not present

## 2019-03-30 DIAGNOSIS — R739 Hyperglycemia, unspecified: Secondary | ICD-10-CM | POA: Diagnosis not present

## 2019-03-30 DIAGNOSIS — Z79899 Other long term (current) drug therapy: Secondary | ICD-10-CM | POA: Diagnosis not present

## 2019-03-30 DIAGNOSIS — C61 Malignant neoplasm of prostate: Secondary | ICD-10-CM | POA: Diagnosis not present

## 2019-05-25 DIAGNOSIS — I10 Essential (primary) hypertension: Secondary | ICD-10-CM | POA: Diagnosis not present

## 2019-05-25 DIAGNOSIS — Z01812 Encounter for preprocedural laboratory examination: Secondary | ICD-10-CM | POA: Diagnosis not present

## 2019-05-25 DIAGNOSIS — Z8546 Personal history of malignant neoplasm of prostate: Secondary | ICD-10-CM | POA: Diagnosis not present

## 2019-05-25 DIAGNOSIS — Z86010 Personal history of colonic polyps: Secondary | ICD-10-CM | POA: Diagnosis not present

## 2019-07-05 DIAGNOSIS — Z01812 Encounter for preprocedural laboratory examination: Secondary | ICD-10-CM | POA: Diagnosis not present

## 2019-07-10 DIAGNOSIS — K640 First degree hemorrhoids: Secondary | ICD-10-CM | POA: Diagnosis not present

## 2019-07-10 DIAGNOSIS — Z1211 Encounter for screening for malignant neoplasm of colon: Secondary | ICD-10-CM | POA: Diagnosis not present

## 2019-07-10 DIAGNOSIS — K5730 Diverticulosis of large intestine without perforation or abscess without bleeding: Secondary | ICD-10-CM | POA: Diagnosis not present

## 2019-07-10 DIAGNOSIS — Z86010 Personal history of colonic polyps: Secondary | ICD-10-CM | POA: Diagnosis not present

## 2019-07-20 ENCOUNTER — Other Ambulatory Visit: Payer: PPO

## 2019-07-24 ENCOUNTER — Ambulatory Visit: Payer: PPO | Admitting: Urology

## 2019-08-03 ENCOUNTER — Telehealth: Payer: Self-pay

## 2019-08-03 NOTE — Telephone Encounter (Signed)
Form faxed for coverage determination of Eligard/Lupron.

## 2019-08-07 ENCOUNTER — Other Ambulatory Visit: Payer: Self-pay

## 2019-08-07 ENCOUNTER — Other Ambulatory Visit: Payer: PPO

## 2019-08-07 DIAGNOSIS — C61 Malignant neoplasm of prostate: Secondary | ICD-10-CM | POA: Diagnosis not present

## 2019-08-08 LAB — PSA: Prostate Specific Ag, Serum: <0.1 ng/mL (ref 0.0–4.0)

## 2019-08-10 NOTE — Telephone Encounter (Signed)
Incoming fax NO PA required for [employer].

## 2019-08-13 NOTE — Progress Notes (Signed)
08/14/2019 8:46 AM   Leonard Stokes 1945/09/08 [id number]  Referring provider: Derinda Late, MD [id number] [facility] [place],  [place] [address] Chief Complaint  Patient presents with  . Prostate Cancer    HPI: Leonard Stokes is a 74 y.o. 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   He was first seen for evaluation of rising PSA up to 4.84. He was also found to have a palpable nodule at the right base, T2b  He underwent prostate biopsy on 05/24/2017. TRUS volume 82g. Prostate biopsy was consistent with high risk prostate cancer, Gleason 4+5 and 4+4 involving all cores on the right up to 96% of the tissue. No left-sided involvement.  CT abdomen pelvis which revealed significant external lymphadenopathy bilaterally measuring up to 3 cm on the left and 1.8 cm in largest diameter on the right. This is not appreciated on the scan in 08/2016. Bone scan was negative for any evidence of osseous mets.He was started onDegeralix with plans to start EBRT.  Prior to initiating EBRT, he did have afluciclovine4 PET scan on 06/23/2017 which showed no evidence of nodalor distant metastasis.  He completed whole pelvic EBRT which was well-tolerated.  He continues on ADT currently.  He is due for repeat Depo today.  His most recent PSA has been undetectable.  [facility] labs showed a PSA of <0.01 on 03/23/2019. PSA was <0.1 on 08/07/2019.  Today the patient reports having severe erectile dysfunction with associated decreased libido and hot flashes. Libido is intact.   Erections were poor prior to getting Lupron injections.  ICI not effective.    Patient is interested in a penile implant.  He is dissatisfied with ED issues which has had a huge impact on his quality of life.     PMH: Past Medical History:  Diagnosis  Date  . Anemia   . Arthritis   . Atherosclerosis of aorta ([place]) 2018  . BPH (benign prostatic hyperplasia)   . Cancer [facility]) 2019   prostate  . Chronic kidney disease 08/2016   kidney stone  . Diverticulosis of sigmoid colon   . History of kidney stones   . Hypertension   . Leg cramps     Surgical History: Past Surgical History:  Procedure Laterality Date  . APPENDECTOMY    . EXTRACORPOREAL SHOCK WAVE LITHOTRIPSY Right 08/19/2016   Procedure: EXTRACORPOREAL SHOCK WAVE LITHOTRIPSY (ESWL);  Surgeon: [name], MD;  Location: [facility] ORS;  Service: Urology;  Laterality: Right;  . EXTRACORPOREAL SHOCK WAVE LITHOTRIPSY Left 03/24/2017   Procedure: EXTRACORPOREAL SHOCK WAVE LITHOTRIPSY (ESWL);  Surgeon: [name], MD;  Location: [facility] ORS;  Service: Urology;  Laterality: Left;  . EYE MUSCLE SURGERY    . INGUINAL HERNIA REPAIR Right 12/23/2017   Direct hernia, medium Ultra Pro mesh;  Surgeon: Robert Bellow, MD;  Location: [facility] ORS;  Service: General;  Laterality: Right;  . JOINT REPLACEMENT    . KNEE ARTHROPLASTY Left 11/15/2016   Procedure: COMPUTER ASSISTED TOTAL KNEE ARTHROPLASTY;  Surgeon: Dereck Leep, MD;  Location: [facility] ORS;  Service: Orthopedics;  Laterality: Left;  . TOTAL KNEE ARTHROPLASTY Right     Home Medications:  Allergies as of 08/14/2019      Reactions   Codeine Rash      Medication List       Accurate as of  August 14, 2019  8:46 AM. If you have any questions, ask your nurse or doctor.        STOP taking these medications   sildenafil 20 MG tablet Commonly known as: Revatio Stopped by: [name], MD   tamsulosin 0.4 MG Caps capsule Commonly known as: Flomax Stopped by: [name], MD     TAKE these medications   acetaminophen 500 MG tablet Commonly known as: TYLENOL Take 1,000 mg by mouth every 6 (six) hours as needed for moderate pain.   B COMPLEX-B12 PO Take 1 tablet by mouth daily.   CALCIUM ACETATE PO Take 2 tablets by  mouth daily.   COD LIVER OIL PO Take 415 mg by mouth daily.   cyclobenzaprine 7.5 MG tablet Commonly known as: FEXMID Take 1 tablet (7.5 mg total) by mouth 3 (three) times daily as needed for muscle spasms.   diclofenac sodium 1 % Gel Commonly known as: Voltaren Apply 2 g topically 4 (four) times daily.   famotidine 20 MG tablet Commonly known as: PEPCID Take 20 mg by mouth at bedtime.   ferrous sulfate 325 (65 FE) MG tablet Take 325 mg by mouth daily with breakfast.   Fish Oil 1000 MG Caps Take by mouth.   hydrochlorothiazide 12.5 MG tablet Commonly known as: HYDRODIURIL   losartan-hydrochlorothiazide 50-12.5 MG tablet Commonly known as: HYZAAR Take 1 tablet by mouth every morning.   meloxicam 15 MG tablet Commonly known as: MOBIC Take 15 mg by mouth daily.   OSTEO BI-FLEX TRIPLE STRENGTH PO Take 1 tablet by mouth daily.   SENIOR MULTIVITAMIN PLUS PO Take 1 tablet by mouth daily.   TURMERIC CURCUMIN PO Take 1,000 mg by mouth daily.   vitamin C 1000 MG tablet Take 1,000 mg by mouth daily.   Vitamin D-3 125 MCG (5000 UT) Tabs Take 5,000 Units by mouth daily.   vitamin E 180 MG (400 UNITS) capsule Take by mouth.   zolpidem 10 MG tablet Commonly known as: AMBIEN Take 10 mg by mouth at bedtime as needed for sleep.       Allergies:  Allergies  Allergen Reactions  . Codeine Rash    Family History: No family history on file.  Social History:  reports that he quit smoking about 23 years ago. His smoking use included cigarettes. He has a 60.00 pack-year smoking history. He has never used smokeless tobacco. He reports current alcohol use. He reports that he does not use drugs.   Physical Exam: BP (!) 156/96   Pulse 76   Ht 6' (1.829 m)   Wt 182 lb (82.6 kg)   BMI 24.68 kg/m   Constitutional:  Alert and oriented, No acute distress. HEENT: [place] AT, moist mucus membranes.  Trachea midline, no masses. Cardiovascular: No clubbing, cyanosis, or  edema. Respiratory: Normal respiratory effort, no increased work of breathing. Skin: No rashes, bruises or suspicious lesions. Neurologic: Grossly intact, no focal deficits, moving all 4 extremities. Psychiatric: Normal mood and affect.   Assessment & Plan:    1. Prostate cancer [facility]) Personal history of high risk prostate cancer status post IMRT  I discussed duration of ADT for at least 2 to 3 years which would be at least another year.  Patient elected to continue through December 2021 with Eligard but likely won't continue thereafter due to QOL  Leuprolide (6 Month) (ELIGARD) injection 45 mg   Recent PSA was <0.1 on 08/07/2019 (undetecable).  2. Combined arterial insufficiency and corporo-venous occlusive erectile dysfunction Lengthy  discussion again today, significant bother Refractory to PDE5i Discussed role of titrating ICI, declined.   May be interested in penile prosthesis, discussed risk and benefits, given literature- would recommend referral to high volume implanter (he will let us know) Reassured ED may improved with cessation of luprolide therapy   [facility] [address] [place], [place] [address] [phone]  I, Selena Batten, am acting as a scribe for Dr. [name].  I have reviewed the above documentation for accuracy and completeness, and I agree with the above.   [name], MD  I spent 30 total minutes on the day of the encounter including pre-visit review of the medical record, face-to-face time with the patient, and post visit ordering of labs/imaging/tests.

## 2019-08-14 ENCOUNTER — Other Ambulatory Visit: Payer: Self-pay

## 2019-08-14 ENCOUNTER — Ambulatory Visit: Payer: PPO | Admitting: Urology

## 2019-08-14 VITALS — BP 156/96 | HR 76 | Ht 72.0 in | Wt 182.0 lb

## 2019-08-14 DIAGNOSIS — C61 Malignant neoplasm of prostate: Secondary | ICD-10-CM

## 2019-08-14 MED ORDER — LEUPROLIDE ACETATE (4 MONTH) 30 MG ~~LOC~~ KIT
30.0000 mg | PACK | Freq: Once | SUBCUTANEOUS | Status: AC
  Administered 2019-08-14: 30 mg via SUBCUTANEOUS

## 2019-09-20 DIAGNOSIS — C61 Malignant neoplasm of prostate: Secondary | ICD-10-CM | POA: Diagnosis not present

## 2019-10-04 IMAGING — PT NM PET NOPR SKULL BASE TO THIGH
1 of 10 series · 1 of 25 positions shown · non-contrast
Comparison: CT 06/03/2017, bone scan 06/03/2017

CLINICAL DATA: High-grade prostate carcinoma (Gleason 4+4=8.

EXAM:
NUCLEAR MEDICINE PET SKULL BASE TO THIGH
TECHNIQUE: 10.5 mCi F-18 Fluciclovine was injected intravenously. Full-ring PET
imaging was performed from the skull base to thigh after the
radiotracer. CT data was obtained and used for attenuation
correction and anatomic localization.

[Series 3: ct wb 5.0 b30f · axial · 5.0mm · 0.98mm/px · 1 of 329 slices shown]
[im 329/329  brain]
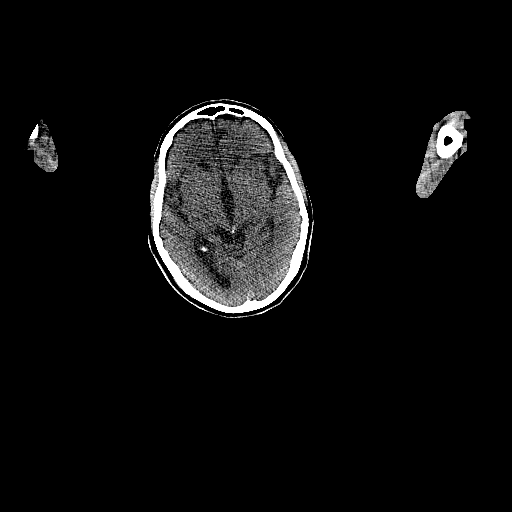

[1 of 25 positions shown; findings below may reference images not displayed]

FINDINGS: NECK

No radiotracer activity in neck lymph nodes.

Incidental CT finding: None

CHEST

No radiotracer accumulation within mediastinal or hilar lymph nodes.
No suspicious pulmonary nodules on the CT scan.

Incidental CT finding: None

ABDOMEN/PELVIS

Prostate: Diffuse moderately elevated radiotracer activity within
the prostate gland with no specific focality (SUV max equal 4.2)..

Lymph nodes: No abnormal radiotracer accumulation within pelvic or
abdominal nodes.

Liver: No evidence of liver metastasis

Incidental CT finding: Extensive sigmoid diverticulosis.
Atherosclerotic calcification of the aorta.

SKELETON

No focal  activity to suggest skeletal metastasis.
IMPRESSION: 1. No evidence of local nodal metastasis in the pelvis.
2. No evidence distant metastatic disease prostate carcinoma or
skeletal disease.
3. Moderate non focal radiotracer activity within the prostate
gland.

## 2019-10-15 DIAGNOSIS — C61 Malignant neoplasm of prostate: Secondary | ICD-10-CM | POA: Diagnosis not present

## 2019-10-28 DIAGNOSIS — Z20828 Contact with and (suspected) exposure to other viral communicable diseases: Secondary | ICD-10-CM | POA: Diagnosis not present

## 2019-10-29 DIAGNOSIS — I10 Essential (primary) hypertension: Secondary | ICD-10-CM | POA: Diagnosis not present

## 2019-10-29 DIAGNOSIS — R739 Hyperglycemia, unspecified: Secondary | ICD-10-CM | POA: Diagnosis not present

## 2019-10-29 DIAGNOSIS — Z79899 Other long term (current) drug therapy: Secondary | ICD-10-CM | POA: Diagnosis not present

## 2019-10-31 ENCOUNTER — Encounter: Payer: Self-pay | Admitting: Physician Assistant

## 2019-10-31 ENCOUNTER — Other Ambulatory Visit: Payer: Self-pay | Admitting: Physician Assistant

## 2019-10-31 DIAGNOSIS — U071 COVID-19: Secondary | ICD-10-CM

## 2019-10-31 DIAGNOSIS — I10 Essential (primary) hypertension: Secondary | ICD-10-CM

## 2019-10-31 DIAGNOSIS — C775 Secondary and unspecified malignant neoplasm of intrapelvic lymph nodes: Secondary | ICD-10-CM

## 2019-10-31 DIAGNOSIS — Z87891 Personal history of nicotine dependence: Secondary | ICD-10-CM

## 2019-10-31 NOTE — Progress Notes (Signed)
I connected by phone with Leonard Stokes on 10/31/2019 at 5:11 PM to discuss the potential use of a new treatment for mild to moderate COVID-19 viral infection in non-hospitalized patients.  This patient is a 74 y.o. male that meets the FDA criteria for Emergency Use Authorization of COVID monoclonal antibody casirivimab/imdevimab.  Has a (+) direct SARS-CoV-2 viral test result  Has mild or moderate COVID-19   Is NOT hospitalized due to COVID-19  Is within 10 days of symptom onset  Has at least one of the high risk factor(s) for progression to severe COVID-19 and/or hospitalization as defined in EUA.  Specific high risk criteria : Older age (>/= 75 yo), BMI > 25, Immunosuppressive Disease or Treatment and Cardiovascular disease or hypertension   I have spoken and communicated the following to the patient or parent/caregiver regarding COVID monoclonal antibody treatment:  1. FDA has authorized the emergency use for the treatment of mild to moderate COVID-19 in adults and pediatric patients with positive results of direct SARS-CoV-2 viral testing who are 12 years of age and older weighing at least 40 kg, and who are at high risk for progressing to severe COVID-19 and/or hospitalization.  2. The significant known and potential risks and benefits of COVID monoclonal antibody, and the extent to which such potential risks and benefits are unknown.  3. Information on available alternative treatments and the risks and benefits of those alternatives, including clinical trials.  4. Patients treated with COVID monoclonal antibody should continue to self-isolate an/d use infection control measures (e.g., wear mask, isolate, social distance, avoid sharing personal items, clean and disinfect high touch surfaces, and frequent handwashing) according to CDC guidelines.   5. The patient or parent/caregiver has the option to accept or refuse COVID monoclonal antibody treatment.  After reviewing this  information with the patient, the patient has agreed to receive one of the available covid 19 monoclonal antibodies and will be provided an appropriate fact sheet prior to infusion.  Sx onset 9/22. Set up for infusion on 9/30 @ 2:30PM . Directions given to [facility]. Pt is aware that insurance will be charged an infusion fee. Pt is fully vaccinated with [place], including the booster.    [name] 10/31/2019 5:11 PM

## 2019-11-01 ENCOUNTER — Ambulatory Visit (HOSPITAL_COMMUNITY)
Admission: RE | Admit: 2019-11-01 | Discharge: 2019-11-01 | Disposition: A | Discharge status: Home / Self Care | Payer: Medicare Other | Source: Ambulatory Visit | Attending: Pulmonary Disease | Admitting: Pulmonary Disease

## 2019-11-01 ENCOUNTER — Other Ambulatory Visit (HOSPITAL_COMMUNITY): Payer: Self-pay

## 2019-11-01 DIAGNOSIS — Z23 Encounter for immunization: Secondary | ICD-10-CM | POA: Insufficient documentation

## 2019-11-01 DIAGNOSIS — C61 Malignant neoplasm of prostate: Secondary | ICD-10-CM | POA: Diagnosis present

## 2019-11-01 DIAGNOSIS — U071 COVID-19: Secondary | ICD-10-CM

## 2019-11-01 DIAGNOSIS — C775 Secondary and unspecified malignant neoplasm of intrapelvic lymph nodes: Secondary | ICD-10-CM | POA: Insufficient documentation

## 2019-11-01 DIAGNOSIS — I10 Essential (primary) hypertension: Secondary | ICD-10-CM | POA: Diagnosis present

## 2019-11-01 DIAGNOSIS — Z87891 Personal history of nicotine dependence: Secondary | ICD-10-CM | POA: Diagnosis present

## 2019-11-01 MED ORDER — SODIUM CHLORIDE 0.9 % IV SOLN
1200.0000 mg | Freq: Once | INTRAVENOUS | Status: AC
  Administered 2019-11-01: 1200 mg via INTRAVENOUS

## 2019-11-01 MED ORDER — METHYLPREDNISOLONE SODIUM SUCC 125 MG IJ SOLR: 125.0000 mg | Freq: Once | INTRAMUSCULAR | Status: DC | PRN

## 2019-11-01 MED ORDER — EPINEPHRINE 0.3 MG/0.3ML IJ SOAJ: 0.3000 mg | Freq: Once | INTRAMUSCULAR | Status: DC | PRN

## 2019-11-01 MED ORDER — SODIUM CHLORIDE 0.9 % IV SOLN: INTRAVENOUS | Status: DC | PRN

## 2019-11-01 MED ORDER — ALBUTEROL SULFATE HFA 108 (90 BASE) MCG/ACT IN AERS: 2.0000 | INHALATION_SPRAY | Freq: Once | RESPIRATORY_TRACT | Status: DC | PRN

## 2019-11-01 MED ORDER — DIPHENHYDRAMINE HCL 50 MG/ML IJ SOLN: 50.0000 mg | Freq: Once | INTRAMUSCULAR | Status: DC | PRN

## 2019-11-01 MED ORDER — FAMOTIDINE IN NACL 20-0.9 MG/50ML-% IV SOLN: 20.0000 mg | Freq: Once | INTRAVENOUS | Status: DC | PRN

## 2019-11-01 NOTE — Progress Notes (Signed)
  Diagnosis: COVID-19  Physician: Asencion Noble, MD   Procedure: Covid Infusion Clinic Med: casirivimab\imdevimab infusion - Provided patient with casirivimab\imdevimab fact sheet for patients, parents and caregivers prior to infusion.  Complications: No immediate complications noted.  Discharge: Discharged home   Barron Schmid 11/01/2019

## 2019-11-01 NOTE — Discharge Instructions (Signed)

## 2019-11-09 DIAGNOSIS — R739 Hyperglycemia, unspecified: Secondary | ICD-10-CM | POA: Diagnosis not present

## 2019-11-09 DIAGNOSIS — N401 Enlarged prostate with lower urinary tract symptoms: Secondary | ICD-10-CM | POA: Diagnosis not present

## 2019-11-09 DIAGNOSIS — I10 Essential (primary) hypertension: Secondary | ICD-10-CM | POA: Diagnosis not present

## 2019-11-09 DIAGNOSIS — C61 Malignant neoplasm of prostate: Secondary | ICD-10-CM | POA: Diagnosis not present

## 2019-11-09 DIAGNOSIS — Z125 Encounter for screening for malignant neoplasm of prostate: Secondary | ICD-10-CM | POA: Diagnosis not present

## 2019-11-09 DIAGNOSIS — Z79899 Other long term (current) drug therapy: Secondary | ICD-10-CM | POA: Diagnosis not present

## 2019-11-09 DIAGNOSIS — F5101 Primary insomnia: Secondary | ICD-10-CM | POA: Diagnosis not present

## 2019-11-09 DIAGNOSIS — R351 Nocturia: Secondary | ICD-10-CM | POA: Diagnosis not present

## 2019-12-24 ENCOUNTER — Other Ambulatory Visit: Payer: Self-pay | Admitting: *Deleted

## 2019-12-25 DIAGNOSIS — H2513 Age-related nuclear cataract, bilateral: Secondary | ICD-10-CM | POA: Diagnosis not present

## 2020-01-02 ENCOUNTER — Inpatient Hospital Stay: Payer: PPO | Attending: Radiation Oncology

## 2020-01-02 ENCOUNTER — Other Ambulatory Visit: Payer: Self-pay

## 2020-01-02 DIAGNOSIS — C61 Malignant neoplasm of prostate: Secondary | ICD-10-CM | POA: Insufficient documentation

## 2020-01-02 LAB — PSA: Prostatic Specific Antigen: <0.01 ng/mL (ref 0.00–4.00)

## 2020-01-09 ENCOUNTER — Ambulatory Visit
Admission: RE | Admit: 2020-01-09 | Discharge: 2020-01-09 | Disposition: A | Discharge status: Home / Self Care | Payer: PPO | Source: Ambulatory Visit | Attending: Radiation Oncology | Admitting: Radiation Oncology

## 2020-01-09 ENCOUNTER — Encounter: Payer: Self-pay | Admitting: Radiation Oncology

## 2020-01-09 ENCOUNTER — Other Ambulatory Visit: Payer: Self-pay

## 2020-01-09 VITALS — BP 135/83 | HR 72 | Temp 97.8°F | Wt 188.4 lb

## 2020-01-09 DIAGNOSIS — C61 Malignant neoplasm of prostate: Secondary | ICD-10-CM | POA: Diagnosis not present

## 2020-01-09 NOTE — Progress Notes (Signed)
Radiation Oncology Follow up Note  Name: Leonard Stokes   Date:   01/09/2020 MRN:  [id number] DOB: [birth year]    This 75 y.o. male presents to the clinic today for 2-1/2-year follow-up status post IMRT radiation therapy to his prostate and pelvic nodes for stage IV (Gleason 9 (4+5) adenocarcinoma presenting with pelvic lymphadenopathy.[name] PROVIDER: Derinda Late, MD  HPI: Patient is a 74 year old male now out 2-1/2 years having completed both androgen deprivation therapy as well as IMRT radiation therapy for Gleason 9 (4+5) adenocarcinoma seen today in routine follow-up he is doing well.  He specifically denies any increased lower urinary tract symptoms diarrhea or fatigue.Marland Kitchen  He does have erectile dysfunction which is a combined arterial insufficiency as well as corporal venous occlusive disease and is being followed by Dr. Erlene Quan.  Patient has declined any therapy further ADT therapy.  Patient's most recent PSA is less than 9.52  COMPLICATIONS OF TREATMENT: none  FOLLOW UP COMPLIANCE: keeps appointments   PHYSICAL EXAM:  BP 135/83   Pulse 72   Temp 97.8 F (36.6 C) (Tympanic)   Wt 188 lb 6.4 oz (85.5 kg)   BMI 25.55 kg/m  Well-developed well-nourished patient in NAD. HEENT reveals PERLA, EOMI, discs not visualized.  Oral cavity is clear. No oral mucosal lesions are identified. Neck is clear without evidence of cervical or supraclavicular adenopathy. Lungs are clear to A&P. Cardiac examination is essentially unremarkable with regular rate and rhythm without murmur rub or thrill. Abdomen is benign with no organomegaly or masses noted. Motor sensory and DTR levels are equal and symmetric in the upper and lower extremities. Cranial nerves II through XII are grossly intact. Proprioception is intact. No peripheral adenopathy or edema is identified. No motor or sensory levels are noted. Crude visual fields are within normal range.  RADIOLOGY RESULTS: No current films for  review  PLAN: Present time patient is doing well under excellent biochemical control of his prostate cancer.  I am pleased with his overall progress.  I have asked to see him back in 1 year for follow-up with a PSA at that time.  Again patient is declining any further ADT therapy.  Patient knows to call with any concerns.  I would like to take this opportunity to thank you for allowing me to participate in the care of your patient.Noreene Filbert, MD

## 2020-02-11 DIAGNOSIS — X32XXXA Exposure to sunlight, initial encounter: Secondary | ICD-10-CM | POA: Diagnosis not present

## 2020-02-11 DIAGNOSIS — L728 Other follicular cysts of the skin and subcutaneous tissue: Secondary | ICD-10-CM | POA: Diagnosis not present

## 2020-02-11 DIAGNOSIS — D2262 Melanocytic nevi of left upper limb, including shoulder: Secondary | ICD-10-CM | POA: Diagnosis not present

## 2020-02-11 DIAGNOSIS — D225 Melanocytic nevi of trunk: Secondary | ICD-10-CM | POA: Diagnosis not present

## 2020-02-11 DIAGNOSIS — Z85828 Personal history of other malignant neoplasm of skin: Secondary | ICD-10-CM | POA: Diagnosis not present

## 2020-02-11 DIAGNOSIS — D2261 Melanocytic nevi of right upper limb, including shoulder: Secondary | ICD-10-CM | POA: Diagnosis not present

## 2020-02-11 DIAGNOSIS — D2271 Melanocytic nevi of right lower limb, including hip: Secondary | ICD-10-CM | POA: Diagnosis not present

## 2020-02-11 DIAGNOSIS — L570 Actinic keratosis: Secondary | ICD-10-CM | POA: Diagnosis not present

## 2020-02-13 ENCOUNTER — Other Ambulatory Visit: Payer: Self-pay | Admitting: *Deleted

## 2020-02-13 DIAGNOSIS — C61 Malignant neoplasm of prostate: Secondary | ICD-10-CM

## 2020-02-14 ENCOUNTER — Other Ambulatory Visit: Payer: PPO

## 2020-02-14 ENCOUNTER — Other Ambulatory Visit: Payer: Self-pay

## 2020-02-14 DIAGNOSIS — C61 Malignant neoplasm of prostate: Secondary | ICD-10-CM

## 2020-02-15 LAB — PSA: Prostate Specific Ag, Serum: <0.1 ng/mL (ref 0.0–4.0)

## 2020-02-19 ENCOUNTER — Ambulatory Visit: Payer: Self-pay | Admitting: Urology

## 2020-02-20 ENCOUNTER — Other Ambulatory Visit: Payer: Self-pay

## 2020-02-20 ENCOUNTER — Ambulatory Visit: Payer: PPO | Admitting: Urology

## 2020-02-20 ENCOUNTER — Encounter: Payer: Self-pay | Admitting: Urology

## 2020-02-20 VITALS — BP 152/74 | HR 85

## 2020-02-20 DIAGNOSIS — C61 Malignant neoplasm of prostate: Secondary | ICD-10-CM

## 2020-02-20 DIAGNOSIS — N5235 Erectile dysfunction following radiation therapy: Secondary | ICD-10-CM

## 2020-02-20 DIAGNOSIS — N401 Enlarged prostate with lower urinary tract symptoms: Secondary | ICD-10-CM

## 2020-02-20 DIAGNOSIS — N138 Other obstructive and reflux uropathy: Secondary | ICD-10-CM

## 2020-02-20 DIAGNOSIS — R109 Unspecified abdominal pain: Secondary | ICD-10-CM | POA: Diagnosis not present

## 2020-02-20 NOTE — Progress Notes (Signed)
02/20/2020 12:31 PM   Leonard Stokes [birth year] [id number]  Referring provider: Derinda Late, MD [phone] [facility] [place],  [place] [address]  Chief Complaint  Patient presents with  . Prostate Cancer    HPI: 75 y.o. male with a personal history of high risk prostate cancer, possible pelvic lymphadenopathy status post IMRT/ADT who returns today for 6 month routine follow-up and lupron.   He wasfirstseen for evaluation of rising PSA up to 4.84. He was also found to have a palpable nodule at the right base, T2b  He underwent prostate biopsy on 05/24/2017. TRUS volume 82g. Prostate biopsy was consistent with high risk prostate cancer, Gleason 4+5 and 4+4 involving all cores on the right up to 96% of the tissue. No left-sided involvement.  CT abdomen pelvis which revealed significant external lymphadenopathy bilaterally measuring up to 3 cm on the left and 1.8 cm in largest diameter on the right. This is not appreciated on the scan in 08/2016. Bone scan was negative for any evidence of osseous mets.He was started onDegeralix with plans to start EBRT.  Prior to initiating EBRT, he did have afluciclovine4 PET scan on 06/23/2017 which showed no evidence of nodalor distant metastasis.  He completed whole pelvic EBRT which was well-tolerated.  Previously decided no further ADT, completed 2.5 years.  Remains undetectable.  He has severe erectile dysfunction unresponsive to intracavernosal injections.  Last visit, he was considering penile prosthesis.   He never followed through with this.  He does have longstanding erectile dysfunction.  Since last visit, he also had a second opinion at [facility] oncology who agreed that no further ADT is needed at this point.  He continues to have hot flashes.  He does have a personal history of BPH on Flomax.  TRUS vol 82 g.  Today he mentions that he occasionally has difficulty starting a  stream and has a little bit of postvoid dribbling.  No significant urgency or frequency.    He also has a personal history of kidney stones.  He reports that he has been having some squeezing pain particularly in his left flank from time to time.  He is worried he might have a kidney stone.   PMH: Past Medical History:  Diagnosis Date  . Anemia   . Arthritis   . Atherosclerosis of aorta ([place]) 2018  . BPH (benign prostatic hyperplasia)   . Cancer [facility]) 2019   prostate  . Chronic kidney disease 08/2016   kidney stone  . Colon cancer ([place])    on lupron  . Diverticulosis of sigmoid colon   . History of kidney stones   . Hypertension   . Leg cramps     Surgical History: Past Surgical History:  Procedure Laterality Date  . APPENDECTOMY    . EXTRACORPOREAL SHOCK WAVE LITHOTRIPSY Right 08/19/2016   Procedure: EXTRACORPOREAL SHOCK WAVE LITHOTRIPSY (ESWL);  Surgeon: [name], MD;  Location: [facility] ORS;  Service: Urology;  Laterality: Right;  . EXTRACORPOREAL SHOCK WAVE LITHOTRIPSY Left 03/24/2017   Procedure: EXTRACORPOREAL SHOCK WAVE LITHOTRIPSY (ESWL);  Surgeon: [name], MD;  Location: [facility] ORS;  Service: Urology;  Laterality: Left;  . EYE MUSCLE SURGERY    . INGUINAL HERNIA REPAIR Right 12/23/2017   Direct hernia, medium Ultra Pro mesh;  Surgeon: Robert Bellow, MD;  Location: [facility] ORS;  Service: General;  Laterality: Right;  . JOINT REPLACEMENT    . KNEE ARTHROPLASTY Left 11/15/2016   Procedure: COMPUTER  ASSISTED TOTAL KNEE ARTHROPLASTY;  Surgeon: Donato HeinzHooten, James P, MD;  Location: [facility] ORS;  Service: Orthopedics;  Laterality: Left;  . TOTAL KNEE ARTHROPLASTY Right     Home Medications:  Allergies as of 02/20/2020      Reactions   Codeine Rash      Medication List       Accurate as of February 20, 2020 12:31 PM. If you have any questions, ask your nurse or doctor.        STOP taking these medications   COD LIVER OIL PO Stopped by: Vanna ScotlandAshley Marquita Lias, MD    diclofenac sodium 1 % Gel Commonly known as: Voltaren Stopped by: Vanna ScotlandAshley Marico Buckle, MD   Fish Oil 1000 MG Caps Stopped by: Vanna ScotlandAshley Jolynda Townley, MD   TURMERIC CURCUMIN PO Stopped by: Vanna ScotlandAshley Shahrzad Koble, MD     TAKE these medications   acetaminophen 500 MG tablet Commonly known as: TYLENOL Take 1,000 mg by mouth every 6 (six) hours as needed for moderate pain.   B COMPLEX-B12 PO Take 1 tablet by mouth daily.   CALCIUM ACETATE PO Take 2 tablets by mouth daily.   cyclobenzaprine 7.5 MG tablet Commonly known as: FEXMID Take 1 tablet (7.5 mg total) by mouth 3 (three) times daily as needed for muscle spasms.   famotidine 20 MG tablet Commonly known as: PEPCID Take 20 mg by mouth at bedtime.   ferrous sulfate 325 (65 FE) MG tablet Take 325 mg by mouth daily with breakfast.   hydrochlorothiazide 12.5 MG tablet Commonly known as: HYDRODIURIL   losartan-hydrochlorothiazide 50-12.5 MG tablet Commonly known as: HYZAAR Take 1 tablet by mouth every morning.   meloxicam 15 MG tablet Commonly known as: MOBIC Take 15 mg by mouth daily.   OSTEO BI-FLEX TRIPLE STRENGTH PO Take 1 tablet by mouth daily.   SENIOR MULTIVITAMIN PLUS PO Take 1 tablet by mouth daily.   tamsulosin 0.4 MG Caps capsule Commonly known as: FLOMAX Take 1 tablet by mouth daily.   vitamin C 1000 MG tablet Take 1,000 mg by mouth daily.   Vitamin D-3 125 MCG (5000 UT) Tabs Take 5,000 Units by mouth daily.   vitamin E 180 MG (400 UNITS) capsule Take by mouth.   zolpidem 10 MG tablet Commonly known as: AMBIEN Take 10 mg by mouth at bedtime as needed for sleep.       Allergies:  Allergies  Allergen Reactions  . Codeine Rash    Family History: No family history on file.  Social History:  reports that he quit smoking about 23 years ago. His smoking use included cigarettes. He has a 60.00 pack-year smoking history. He has never used smokeless tobacco. He reports current alcohol use. He reports that he does  not use drugs.   Physical Exam: BP (!) 152/74   Pulse 85   Constitutional:  Alert and oriented, No acute distress. HEENT: [place] AT, moist mucus membranes.  Trachea midline, no masses. Cardiovascular: No clubbing, cyanosis, or edema. Respiratory: Normal respiratory effort, no increased work of breathing. Skin: No rashes, bruises or suspicious lesions. Neurologic: Grossly intact, no focal deficits, moving all 4 extremities. Psychiatric: Normal mood and affect.   Assessment & Plan:    1. Erectile dysfunction following radiation therapy Lengthy discussion about management of this.  Previously failed ICI and PDE 5 inhibitors.  We discussed trial of super trimix or alprostadil alone with up titration of dose.  Ultimately, he thinks that he would like to move towards a penile prosthesis.  Referral to Dr[facility]  Fingler at [facility].  - Ambulatory referral to Urology  2. Prostate cancer (HCC) NED, PSA is undetectable  No further ADT at this time  Will continue to check and every 6 month basis - PSA; Future  3. Flank pain Intermittent occasional flank pain with history of stones  KUB today  - Abdomen 1 view (KUB); Future  4. Benign prostatic hyperplasia with urinary obstruction Continue Flomax  Discussion today regarding consideration of outlet procedure, is not interested in this at this time may consider in the future if his symptoms worsen.  Continue to monitor.   Plan for follow-up in 6 months with PSA prior   [name], MD  [facility] [address] [place],  [address] [phone]  I spent 40 total minutes on the day of the encounter including pre-visit review of the medical record, face-to-face time with the patient, and post visit ordering of labs/imaging/tests.

## 2020-02-27 ENCOUNTER — Encounter: Payer: Self-pay | Admitting: Urology

## 2020-02-27 NOTE — Telephone Encounter (Signed)
Called patient to inform to get KUB done. Patient verbalized understanding. Order in on 02/20/20.

## 2020-03-03 ENCOUNTER — Ambulatory Visit
Admission: RE | Admit: 2020-03-03 | Discharge: 2020-03-03 | Disposition: A | Discharge status: Home / Self Care | Payer: PPO | Source: Ambulatory Visit | Attending: Urology | Admitting: Urology

## 2020-03-03 ENCOUNTER — Telehealth: Payer: Self-pay

## 2020-03-03 ENCOUNTER — Other Ambulatory Visit: Payer: Self-pay

## 2020-03-03 DIAGNOSIS — R109 Unspecified abdominal pain: Secondary | ICD-10-CM

## 2020-03-03 DIAGNOSIS — Z87442 Personal history of urinary calculi: Secondary | ICD-10-CM | POA: Diagnosis not present

## 2020-03-03 DIAGNOSIS — I878 Other specified disorders of veins: Secondary | ICD-10-CM | POA: Diagnosis not present

## 2020-03-03 NOTE — Telephone Encounter (Signed)
Patient aware of results.

## 2020-03-03 NOTE — Telephone Encounter (Signed)
-----   Message from [name], MD sent at 03/03/2020  3:18 PM EST ----- No obvious stones on this x-ray.  [name], MD

## 2020-03-25 ENCOUNTER — Other Ambulatory Visit: Payer: Self-pay | Admitting: Oncology

## 2020-05-05 DIAGNOSIS — I10 Essential (primary) hypertension: Secondary | ICD-10-CM | POA: Diagnosis not present

## 2020-05-05 DIAGNOSIS — Z79899 Other long term (current) drug therapy: Secondary | ICD-10-CM | POA: Diagnosis not present

## 2020-05-05 DIAGNOSIS — Z125 Encounter for screening for malignant neoplasm of prostate: Secondary | ICD-10-CM | POA: Diagnosis not present

## 2020-05-05 DIAGNOSIS — R739 Hyperglycemia, unspecified: Secondary | ICD-10-CM | POA: Diagnosis not present

## 2020-05-12 ENCOUNTER — Encounter: Payer: Self-pay | Admitting: Urology

## 2020-05-12 DIAGNOSIS — N5235 Erectile dysfunction following radiation therapy: Secondary | ICD-10-CM | POA: Diagnosis not present

## 2020-05-12 DIAGNOSIS — L723 Sebaceous cyst: Secondary | ICD-10-CM | POA: Diagnosis not present

## 2020-05-13 ENCOUNTER — Other Ambulatory Visit: Payer: Self-pay | Admitting: Family Medicine

## 2020-05-13 DIAGNOSIS — E781 Pure hyperglyceridemia: Secondary | ICD-10-CM | POA: Diagnosis not present

## 2020-05-13 DIAGNOSIS — Z1331 Encounter for screening for depression: Secondary | ICD-10-CM | POA: Diagnosis not present

## 2020-05-13 DIAGNOSIS — Z0000 Encounter for general adult medical examination without abnormal findings: Secondary | ICD-10-CM | POA: Diagnosis not present

## 2020-05-13 DIAGNOSIS — Z136 Encounter for screening for cardiovascular disorders: Secondary | ICD-10-CM | POA: Diagnosis not present

## 2020-05-13 DIAGNOSIS — D696 Thrombocytopenia, unspecified: Secondary | ICD-10-CM | POA: Diagnosis not present

## 2020-05-26 NOTE — Telephone Encounter (Signed)
Left VM for patient to return call-how he would like to proceed

## 2020-05-26 NOTE — Telephone Encounter (Signed)
Patient returned call-would like to proceed with Trimix teaching. Appointment scheduled.

## 2020-05-30 ENCOUNTER — Ambulatory Visit: Payer: PPO | Admitting: Urology

## 2020-05-30 ENCOUNTER — Other Ambulatory Visit: Payer: Self-pay

## 2020-05-30 ENCOUNTER — Ambulatory Visit (INDEPENDENT_AMBULATORY_CARE_PROVIDER_SITE_OTHER): Payer: PPO | Admitting: Urology

## 2020-05-30 ENCOUNTER — Encounter: Payer: Self-pay | Admitting: Urology

## 2020-05-30 VITALS — BP 164/82 | HR 76 | Ht 72.0 in | Wt 188.0 lb

## 2020-05-30 DIAGNOSIS — N5235 Erectile dysfunction following radiation therapy: Secondary | ICD-10-CM | POA: Diagnosis not present

## 2020-05-30 NOTE — Progress Notes (Signed)
05/30/2020 10:16 AM   Leonard Stokes January 11, 1946 [id number]  Referring provider: Derinda Late, MD [phone] [facility] [place],  [place] [address]  Chief Complaint  Patient presents with  . Erectile Dysfunction   Urological history: 1. Prostate cancer  -PSA <0.01 in 05/2020  -high risk prostate cancer, possible pelvic lymphadenopathy status post IMRT/ADT   2. ED -failed PDE5i's -failed ICI -has seen Dr. Francesca Jewett and has been offered IPP    HPI: Leonard Stokes is a 75 y.o. male who presents today to discuss a retrial of ICI injections.    He was seen by Dr. Neale Burly on May 12, 2020 and has been deemed appropriate candidate for IPP.   He would like to have another trial of ICI before deciding on placement of the IPP.   He had been tried with Trimix (30/1/10) 0.7 mg without satisfactory erections.  Patient has not had an erection in over three years.    SHIM    Row Name 05/30/20 0847         SHIM: Over the last 6 months:   How do you rate your confidence that you could get and keep an erection? Very Low     When you had erections with sexual stimulation, how often were your erections hard enough for penetration (entering your partner)? Almost Never or Never     During sexual intercourse, how often were you able to maintain your erection after you had penetrated (entered) your partner? Almost Never or Never     During sexual intercourse, how difficult was it to maintain your erection to completion of intercourse? Extremely Difficult     When you attempted sexual intercourse, how often was it satisfactory for you? Almost Never or Never           SHIM Total Score   SHIM 5            Score: 1-7 Severe ED 8-11 Moderate ED 12-16 Mild-Moderate ED 17-21 Mild ED 22-25 No ED   PMH: Past Medical History:  Diagnosis Date  . Anemia   . Arthritis   . Atherosclerosis of aorta ([place]) 2018  . BPH (benign prostatic hyperplasia)    . Cancer [facility]) 2019   prostate  . Chronic kidney disease 08/2016   kidney stone  . Colon cancer ([place])    on lupron  . Diverticulosis of sigmoid colon   . History of kidney stones   . Hypertension   . Leg cramps     Surgical History: Past Surgical History:  Procedure Laterality Date  . APPENDECTOMY    . EXTRACORPOREAL SHOCK WAVE LITHOTRIPSY Right 08/19/2016   Procedure: EXTRACORPOREAL SHOCK WAVE LITHOTRIPSY (ESWL);  Surgeon: [name], MD;  Location: [facility] ORS;  Service: Urology;  Laterality: Right;  . EXTRACORPOREAL SHOCK WAVE LITHOTRIPSY Left 03/24/2017   Procedure: EXTRACORPOREAL SHOCK WAVE LITHOTRIPSY (ESWL);  Surgeon: [name], MD;  Location: [facility] ORS;  Service: Urology;  Laterality: Left;  . EYE MUSCLE SURGERY    . INGUINAL HERNIA REPAIR Right 12/23/2017   Direct hernia, medium Ultra Pro mesh;  Surgeon: Robert Bellow, MD;  Location: [facility] ORS;  Service: General;  Laterality: Right;  . JOINT REPLACEMENT    . KNEE ARTHROPLASTY Left 11/15/2016   Procedure: COMPUTER ASSISTED TOTAL KNEE ARTHROPLASTY;  Surgeon: Dereck Leep, MD;  Location: [facility] ORS;  Service: Orthopedics;  Laterality: Left;  . TOTAL KNEE ARTHROPLASTY Right     Home  Medications:  Allergies as of 05/30/2020      Reactions   Codeine Rash      Medication List       Accurate as of May 30, 2020 10:16 AM. If you have any questions, ask your nurse or doctor.        STOP taking these medications   cyclobenzaprine 7.5 MG tablet Commonly known as: FEXMID Stopped by: Zara Council, PA-C     TAKE these medications   acetaminophen 500 MG tablet Commonly known as: TYLENOL Take 1,000 mg by mouth every 6 (six) hours as needed for moderate pain.   B COMPLEX-B12 PO Take 1 tablet by mouth daily.   CALCIUM ACETATE PO Take 2 tablets by mouth daily.   cyanocobalamin 1000 MCG tablet Take by mouth.   famotidine 20 MG tablet Commonly known as: PEPCID Take 20 mg by mouth at bedtime.    ferrous sulfate 325 (65 FE) MG tablet Take 325 mg by mouth daily with breakfast.   hydrochlorothiazide 12.5 MG tablet Commonly known as: HYDRODIURIL   losartan-hydrochlorothiazide 50-12.5 MG tablet Commonly known as: HYZAAR Take 1 tablet by mouth every morning.   meloxicam 15 MG tablet Commonly known as: MOBIC Take 15 mg by mouth daily.   OSTEO BI-FLEX TRIPLE STRENGTH PO Take 1 tablet by mouth daily.   SENIOR MULTIVITAMIN PLUS PO Take 1 tablet by mouth daily.   tamsulosin 0.4 MG Caps capsule Commonly known as: FLOMAX Take 1 tablet by mouth daily.   vitamin C 1000 MG tablet Take 1,000 mg by mouth daily.   Vitamin D-3 125 MCG (5000 UT) Tabs Take 5,000 Units by mouth daily.   vitamin E 180 MG (400 UNITS) capsule Take by mouth.   zolpidem 10 MG tablet Commonly known as: AMBIEN Take 10 mg by mouth at bedtime as needed for sleep.       Allergies:  Allergies  Allergen Reactions  . Codeine Rash    Family History: No family history on file.  Social History:  reports that he quit smoking about 24 years ago. His smoking use included cigarettes. He has a 60.00 pack-year smoking history. He has never used smokeless tobacco. He reports current alcohol use. He reports that he does not use drugs.  ROS: Pertinent ROS in HPI  Physical Exam: BP (!) 164/82   Pulse 76   Ht 6' (1.829 m)   Wt 188 lb (85.3 kg)   BMI 25.50 kg/m   Constitutional:  Well nourished. Alert and oriented, No acute distress. HEENT: [place] AT, mask in place.  Trachea midline Cardiovascular: No clubbing, cyanosis, or edema. Respiratory: Normal respiratory effort, no increased work of breathing. Neurologic: Grossly intact, no focal deficits, moving all 4 extremities. Psychiatric: Normal mood and affect.  Laboratory Data: PSA (Prostate Specific Antigen), Total 0.10 - 4.00 ng/mL <0.01Low   Resulting Agency  [facility] - LAB   Narrative Performed by [facility] - LAB Test results  were determined with Beckman Coulter Hybritech Assay. Values obtained with different assay methods cannot be used interchangeably in serial testing. Assay results should not be interpreted as absolute evidence of the presence or absence of malignant disease Specimen Collected: 05/05/20 08:11 Last Resulted: 05/05/20 13:36  Received From: [facility]  Result Received: 05/12/20 15:02   WBC (White Blood Cell Count) 4.1 - 10.2 10^3/uL 5.5   RBC (Red Blood Cell Count) 4.69 - 6.13 10^6/uL 3.83Low   Hemoglobin 14.1 - 18.1 gm/dL 12.4Low   Hematocrit 40.0 -  52.0 % 37.2Low   MCV (Mean Corpuscular Volume) 80.0 - 100.0 fl 97.1   MCH (Mean Corpuscular Hemoglobin) 27.0 - 31.2 pg 32.4High   MCHC (Mean Corpuscular Hemoglobin Concentration) 32.0 - 36.0 gm/dL 33.3   Platelet Count 150 - 450 10^3/uL 124Low   RDW-CV (Red Cell Distribution Width) 11.6 - 14.8 % 14.4   MPV (Mean Platelet Volume) 9.4 - 12.4 fl 11.3   Neutrophils 1.50 - 7.80 10^3/uL 2.87   Lymphocytes 1.00 - 3.60 10^3/uL 1.25   Monocytes 0.00 - 1.50 10^3/uL 1.30   Eosinophils 0.00 - 0.55 10^3/uL 0.03   Basophils 0.00 - 0.09 10^3/uL 0.02   Neutrophil % 32.0 - 70.0 % 51.9   Lymphocyte % 10.0 - 50.0 % 22.6   Monocyte % 4.0 - 13.0 % 23.5High   Eosinophil % 1.0 - 5.0 % 0.5Low   Basophil% 0.0 - 2.0 % 0.4   Immature Granulocyte % <=0.7 % 1.1High   Immature Granulocyte Count <=0.06 10^3/L 0.06   Resulting Agency  [facility] - LAB  Specimen Collected: 05/05/20 08:11 Last Resulted: 05/05/20 11:41  Received From: [facility]  Result Received: 05/12/20 15:02    Ref Range & Units 3 wk ago  Hemoglobin A1C 4.2 - 5.6 % 6.2High   Average Blood Glucose (Calc) mg/dL 131   Resulting Agency  [place] - LAB   Narrative Performed by [facility] - LAB Normal Range:  4.2 - 5.6%  Increased Risk: 5.7 - 6.4%  Diabetes:    >= 6.5%  Glycemic Control for adults with  diabetes: <7%  Specimen Collected: 05/05/20 08:11 Last Resulted: 05/05/20 14:54  Received From: [facility]  Result Received: 05/12/20 15:02    Ref Range & Units 3 wk ago  Cholesterol, Total 100 - 200 mg/dL 164   Triglyceride 35 - 199 mg/dL 263High   HDL (High Density Lipoprotein) Cholesterol 29.0 - 71.0 mg/dL 37.9   LDL Calculated 0 - 130 mg/dL 74   VLDL Cholesterol mg/dL 53   Cholesterol/HDL Ratio  4.3   Resulting Agency  [facility] - LAB  Specimen Collected: 05/05/20 08:11 Last Resulted: 05/05/20 14:38  Received From: [facility]  Result Received: 05/12/20 15:02   Glucose 70 - 110 mg/dL 101   Sodium 136 - 145 mmol/L 141   Potassium 3.6 - 5.1 mmol/L 4.3   Chloride 97 - 109 mmol/L 105   Carbon Dioxide (CO2) 22.0 - 32.0 mmol/L 28.4   Urea Nitrogen (BUN) 7 - 25 mg/dL 20   Creatinine 0.7 - 1.3 mg/dL 0.8   Glomerular Filtration Rate (eGFR), MDRD Estimate >60 mL/min/1.73sq m 94   Calcium 8.7 - 10.3 mg/dL 9.5   AST  8 - 39 U/L 16   ALT  6 - 57 U/L 16   Alk Phos (alkaline Phosphatase) 34 - 104 U/L 67   Albumin 3.5 - 4.8 g/dL 4.5   Bilirubin, Total 0.3 - 1.2 mg/dL 0.6   Protein, Total 6.1 - 7.9 g/dL 6.6   A/G Ratio 1.0 - 5.0 gm/dL 2.1   Resulting Agency  [facility] - LAB  Specimen Collected: 05/05/20 08:11 Last Resulted: 05/05/20 14:38  Received From: [facility] [facility]  Result Received: 05/12/20 15:02  I have reviewed the labs.   Pertinent Imaging: N/A  Assessment & Plan:    1. ED -we discussed that we could have a trial of Super Trimix or alprostadil to see if that would be effective  for him.  After I explained how a trial would be conducted, he decided that he would go ahead with the IPP as he did not have the confidence the [place] would work and he finds the medication cost prohibitive -he will contact Dr. Francesca Jewett to schedule his IPP  These notes generated with voice recognition software. I apologize for  typographical errors.  Zara Council, PA-C  [facility] [address]  [place], [place] [address] [phone]  I spent 25 minutes on the day of the encounter to include pre-visit record review, face-to-face time with the patient, and post-visit ordering of tests.

## 2020-06-04 ENCOUNTER — Ambulatory Visit: Payer: PPO

## 2020-06-09 ENCOUNTER — Ambulatory Visit
Admission: RE | Admit: 2020-06-09 | Discharge: 2020-06-09 | Disposition: A | Discharge status: Home / Self Care | Payer: PPO | Source: Ambulatory Visit | Attending: Family Medicine | Admitting: Family Medicine

## 2020-06-09 ENCOUNTER — Other Ambulatory Visit: Payer: Self-pay

## 2020-06-09 DIAGNOSIS — Z87891 Personal history of nicotine dependence: Secondary | ICD-10-CM | POA: Diagnosis not present

## 2020-06-09 DIAGNOSIS — Z136 Encounter for screening for cardiovascular disorders: Secondary | ICD-10-CM | POA: Insufficient documentation

## 2020-06-09 DIAGNOSIS — Z20822 Contact with and (suspected) exposure to covid-19: Secondary | ICD-10-CM | POA: Diagnosis not present

## 2020-06-16 DIAGNOSIS — Z20822 Contact with and (suspected) exposure to covid-19: Secondary | ICD-10-CM | POA: Diagnosis not present

## 2020-07-09 ENCOUNTER — Ambulatory Visit: Payer: PPO

## 2020-07-11 DIAGNOSIS — Z885 Allergy status to narcotic agent status: Secondary | ICD-10-CM | POA: Diagnosis not present

## 2020-07-11 DIAGNOSIS — M1990 Unspecified osteoarthritis, unspecified site: Secondary | ICD-10-CM | POA: Diagnosis not present

## 2020-07-11 DIAGNOSIS — L723 Sebaceous cyst: Secondary | ICD-10-CM | POA: Diagnosis not present

## 2020-07-11 DIAGNOSIS — C61 Malignant neoplasm of prostate: Secondary | ICD-10-CM | POA: Diagnosis not present

## 2020-07-11 DIAGNOSIS — Z791 Long term (current) use of non-steroidal anti-inflammatories (NSAID): Secondary | ICD-10-CM | POA: Diagnosis not present

## 2020-07-11 DIAGNOSIS — Z79899 Other long term (current) drug therapy: Secondary | ICD-10-CM | POA: Diagnosis not present

## 2020-07-11 DIAGNOSIS — Z87891 Personal history of nicotine dependence: Secondary | ICD-10-CM | POA: Diagnosis not present

## 2020-07-11 DIAGNOSIS — L720 Epidermal cyst: Secondary | ICD-10-CM | POA: Diagnosis not present

## 2020-07-11 DIAGNOSIS — Z923 Personal history of irradiation: Secondary | ICD-10-CM | POA: Diagnosis not present

## 2020-08-06 DIAGNOSIS — L249 Irritant contact dermatitis, unspecified cause: Secondary | ICD-10-CM | POA: Diagnosis not present

## 2020-08-06 DIAGNOSIS — L570 Actinic keratosis: Secondary | ICD-10-CM | POA: Diagnosis not present

## 2020-08-07 DIAGNOSIS — B372 Candidiasis of skin and nail: Secondary | ICD-10-CM | POA: Diagnosis not present

## 2020-08-07 DIAGNOSIS — N5235 Erectile dysfunction following radiation therapy: Secondary | ICD-10-CM | POA: Diagnosis not present

## 2020-08-07 DIAGNOSIS — L723 Sebaceous cyst: Secondary | ICD-10-CM | POA: Diagnosis not present

## 2020-08-11 ENCOUNTER — Other Ambulatory Visit: Payer: PPO

## 2020-08-11 ENCOUNTER — Other Ambulatory Visit: Payer: Self-pay

## 2020-08-11 DIAGNOSIS — C61 Malignant neoplasm of prostate: Secondary | ICD-10-CM

## 2020-08-12 LAB — PSA: Prostate Specific Ag, Serum: <0.1 ng/mL (ref 0.0–4.0)

## 2020-08-19 ENCOUNTER — Other Ambulatory Visit: Payer: Self-pay

## 2020-08-25 NOTE — Progress Notes (Signed)
08/26/2020 10:08 AM   Leonard Stokes [birth year] [id number]  Referring provider: Derinda Late, MD [phone] [address] [place],  [place] [address]  Chief Complaint  Patient presents with   Prostate Cancer    HPI: 75 year old male with a personal history of prostate cancer, kidney stones, erectile dysfunction who returns today for routine follow-up.  Since last visit, he was scheduled to undergo penile prosthesis placement at [facility] with Dr. Francesca Jewett.  Unfortunately, he was found to have a scrotal sebaceous cyst which required excision and for that to heal prior to 3-piece penile implant placement and thus underwent excision for this.  He reports that its been a little bit of a nightmare since then, he has been dealing with wound care issues and asked me to look at his wound today.  He brings a picture of a BV raw area which is open in his right hemiscrotum.  His wife has been packing this daily.  He is now having second thoughts about penile prosthesis.  He wish he had followed through with injection teaching again.  He previously tried this unsuccessfully but wanted to try again.  He was first seen for evaluation of rising PSA up to 4.84.  He was also found to have a palpable nodule at the right base, T2b   He underwent prostate biopsy on 05/24/2017.  TRUS volume 82g. Prostate biopsy was consistent with high risk prostate cancer, Gleason 4+5 and 4+4 involving all cores on the right up to 96% of the tissue.  No left-sided involvement.   CT abdomen pelvis which revealed significant external lymphadenopathy bilaterally measuring up to 3 cm on the left and 1.8 cm in largest diameter on the right.  This is not appreciated on the scan in 08/2016.  Bone scan was negative for any evidence of osseous mets.  He was started on Degeralix with plans to start EBRT.   Prior to initiating EBRT, he did have a fluciclovine 4 PET scan on 06/23/2017 which showed no  evidence of nodal or distant metastasis.     He completed whole pelvic EBRT which was well-tolerated.  Previously decided no further ADT, completed 2.5 years.  Remains undetectable  He does also have a personal history of BPH on Flomax.  His significant prostamegaly.  Today he reports that he has been voiding well.  He has no urinary complaints today.  In January, is concerned that he might have a stone.  He had a KUB which was negative.  No recent cross-sectional imaging.   PMH: Past Medical History:  Diagnosis Date   Anemia    Arthritis    Atherosclerosis of aorta ([place]) 2018   BPH (benign prostatic hyperplasia)    Cancer ([place]) 2019   prostate   Chronic kidney disease 08/2016   kidney stone   Colon cancer ([place])    on lupron   Diverticulosis of sigmoid colon    History of kidney stones    Hypertension    Leg cramps     Surgical History: Past Surgical History:  Procedure Laterality Date   APPENDECTOMY     EXTRACORPOREAL SHOCK WAVE LITHOTRIPSY Right 08/19/2016   Procedure: EXTRACORPOREAL SHOCK WAVE LITHOTRIPSY (ESWL);  Surgeon: [name], MD;  Location: [facility] ORS;  Service: Urology;  Laterality: Right;   EXTRACORPOREAL SHOCK WAVE LITHOTRIPSY Left 03/24/2017   Procedure: EXTRACORPOREAL SHOCK WAVE LITHOTRIPSY (ESWL);  Surgeon: [name], MD;  Location: [facility] ORS;  Service: Urology;  Laterality: Left;  EYE MUSCLE SURGERY     INGUINAL HERNIA REPAIR Right 12/23/2017   Direct hernia, medium Ultra Pro mesh;  Surgeon: Robert Bellow, MD;  Location: [facility] ORS;  Service: General;  Laterality: Right;   JOINT REPLACEMENT     KNEE ARTHROPLASTY Left 11/15/2016   Procedure: COMPUTER ASSISTED TOTAL KNEE ARTHROPLASTY;  Surgeon: Dereck Leep, MD;  Location: [facility] ORS;  Service: Orthopedics;  Laterality: Left;   TOTAL KNEE ARTHROPLASTY Right     Home Medications:  Allergies as of 08/26/2020       Reactions   Codeine Rash        Medication List        Accurate as of  August 26, 2020 11:59 PM. If you have any questions, ask your nurse or doctor.          acetaminophen 500 MG tablet Commonly known as: TYLENOL Take 1,000 mg by mouth every 6 (six) hours as needed for moderate pain.   atorvastatin 20 MG tablet Commonly known as: LIPITOR Take 20 mg by mouth daily.   B COMPLEX-B12 PO Take 1 tablet by mouth daily.   CALCIUM ACETATE PO Take 2 tablets by mouth daily.   cyanocobalamin 1000 MCG tablet Take by mouth.   famotidine 20 MG tablet Commonly known as: PEPCID Take 20 mg by mouth at bedtime.   ferrous sulfate 325 (65 FE) MG tablet Take 325 mg by mouth daily with breakfast.   hydrochlorothiazide 12.5 MG tablet Commonly known as: HYDRODIURIL   hydrocortisone 2.5 % ointment Apply 1 application topically 2 (two) times daily.   losartan-hydrochlorothiazide 50-12.5 MG tablet Commonly known as: HYZAAR Take 1 tablet by mouth every morning.   meloxicam 15 MG tablet Commonly known as: MOBIC Take 15 mg by mouth daily.   NONFORMULARY OR COMPOUNDED ITEM Trimix (30/1/10)-(Pap/Phent/PGE)  Test Dose  83m vial   Qty #3 [phone] [phone] by: AHollice Espy MD   nystatin cream Commonly known as: MYCOSTATIN Apply 1 application topically 2 (two) times daily.   OSTEO BI-FLEX TRIPLE STRENGTH PO Take 1 tablet by mouth daily.   SENIOR MULTIVITAMIN PLUS PO Take 1 tablet by mouth daily.   tamsulosin 0.4 MG Caps capsule Commonly known as: FLOMAX Take 1 tablet by mouth daily.   vitamin C 1000 MG tablet Take 1,000 mg by mouth daily.   Vitamin D-3 125 MCG (5000 UT) Tabs Take 5,000 Units by mouth daily.   vitamin E 180 MG (400 UNITS) capsule Take by mouth.   zolpidem 10 MG tablet Commonly known as: AMBIEN Take 10 mg by mouth at bedtime as needed for sleep.        Allergies:  Allergies  Allergen Reactions   Codeine Rash    Family History: No family history on  file.  Social History:  reports that he quit smoking about 24 years ago. His smoking use included cigarettes. He has a 60.00 pack-year smoking history. He has never used smokeless tobacco. He reports current alcohol use. He reports that he does not use drugs.   Physical Exam: BP (!) 142/79   Pulse 76   Ht 6' (1.829 m)   Wt 187 lb (84.8 kg)   BMI 25.36 kg/m   Constitutional:  Alert and oriented, No acute distress. HEENT: [place] AT, moist mucus membranes.  Trachea midline, no masses. Cardiovascular: No clubbing, cyanosis, or edema. Respiratory: Normal respiratory effort, no increased work of breathing. GU: Packing tape removed within the right hemiscrotum  laterally.  Open approximately 1 cm area which is 1 cm deep with healthy granulation tissue within the bed without significant surrounding erythema or drainage. Skin: No rashes, bruises or suspicious lesions. Neurologic: Grossly intact, no focal deficits, moving all 4 extremities. Psychiatric: Normal mood and affect.   Assessment & Plan:    1. Prostate cancer ([place]) No evidence of disease, PSA remains undetectable  We again today discussed the concept of nadir and anticipate his PSA to rise once the ADT effect wanes.  We did conversation today about whether or not to check a testosterone to see where he is and his testicular recovery although given that we would likely not intervene on this or make any changes to his treatment, etc. we elected to forego this.  Repeat PSA in 6 months  2. Erectile dysfunction following radiation therapy Length conversation today about this issue, had elected to undergo penile prosthesis which is now delayed  He is having second thoughts about whether or not to pursue this, we discussed risk and benefits again in detail  After our discussion today, he would in fact like to try injection teaching again.  We discussed our office protocol incontinence medication to compounding pharmacy for test dose  teaching.  3. Scrotal sebaceous cyst Wound check today, deep incision which is open but appears to be healing well, continue daily packing     [name], MD  [facility] [address], [place], [place] [address] [phone]

## 2020-08-26 ENCOUNTER — Ambulatory Visit: Payer: PPO | Admitting: Urology

## 2020-08-26 ENCOUNTER — Other Ambulatory Visit: Payer: Self-pay

## 2020-08-26 VITALS — BP 142/79 | HR 76 | Ht 72.0 in | Wt 187.0 lb

## 2020-08-26 DIAGNOSIS — N5235 Erectile dysfunction following radiation therapy: Secondary | ICD-10-CM

## 2020-08-26 DIAGNOSIS — C61 Malignant neoplasm of prostate: Secondary | ICD-10-CM

## 2020-08-26 DIAGNOSIS — L723 Sebaceous cyst: Secondary | ICD-10-CM | POA: Diagnosis not present

## 2020-08-26 MED ORDER — NONFORMULARY OR COMPOUNDED ITEM: 0 refills | Status: DC

## 2020-08-26 MED ORDER — NONFORMULARY OR COMPOUNDED ITEM: 0 refills | Status: AC

## 2020-08-27 DIAGNOSIS — J300 Vasomotor rhinitis: Secondary | ICD-10-CM | POA: Diagnosis not present

## 2020-08-27 DIAGNOSIS — K219 Gastro-esophageal reflux disease without esophagitis: Secondary | ICD-10-CM | POA: Diagnosis not present

## 2020-09-11 DIAGNOSIS — L723 Sebaceous cyst: Secondary | ICD-10-CM | POA: Diagnosis not present

## 2020-09-11 DIAGNOSIS — N5235 Erectile dysfunction following radiation therapy: Secondary | ICD-10-CM | POA: Diagnosis not present

## 2020-09-23 NOTE — Telephone Encounter (Signed)
As long as it is under 150/100, I'm okay.

## 2020-09-30 NOTE — Progress Notes (Signed)
10/01/2020 9:55 AM   Leonard Stokes 01-03-1946 [id number]  Referring provider: Derinda Late, MD [phone] [facility] [place],  [place] [address]  Urological history: 1. Prostate cancer  -PSA <0.01 in 08/2020  -high risk prostate cancer, possible pelvic lymphadenopathy status post IMRT /ADT   2. ED -failed PDE5i's -failed ICI -has seen Dr. Francesca Jewett and has been offered IPP   Chief Complaint  Patient presents with   Erectile Dysfunction    HPI: Leonard Stokes is a 75 y.o. male who presents today for ICI injection titration.   He is currently scheduled for IPP on October 21 with Dr. Francesca Jewett.  He did not pick up the higher potency [facility], so he has no medications with him at today's visit.  PMH: Past Medical History:  Diagnosis Date   Anemia    Arthritis    Atherosclerosis of aorta ([place]) 2018   BPH (benign prostatic hyperplasia)    Cancer ([place]) 2019   prostate   Chronic kidney disease 08/2016   kidney stone   Colon cancer ([place])    on lupron   Diverticulosis of sigmoid colon    History of kidney stones    Hypertension    Leg cramps     Surgical History: Past Surgical History:  Procedure Laterality Date   APPENDECTOMY     EXTRACORPOREAL SHOCK WAVE LITHOTRIPSY Right 08/19/2016   Procedure: EXTRACORPOREAL SHOCK WAVE LITHOTRIPSY (ESWL);  Surgeon: [name], MD;  Location: [facility];  Service: Urology;  Laterality: Right;   EXTRACORPOREAL SHOCK WAVE LITHOTRIPSY Left 03/24/2017   Procedure: EXTRACORPOREAL SHOCK WAVE LITHOTRIPSY (ESWL);  Surgeon: [name], MD;  Location: [facility];  Service: Urology;  Laterality: Left;   EYE MUSCLE SURGERY     INGUINAL HERNIA REPAIR Right 12/23/2017   Direct hernia, medium Ultra Pro mesh;  Surgeon: Robert Bellow, MD;  Location: [facility];  Service: General;  Laterality: Right;   JOINT REPLACEMENT     KNEE ARTHROPLASTY Left 11/15/2016   Procedure:  COMPUTER ASSISTED TOTAL KNEE ARTHROPLASTY;  Surgeon: Dereck Leep, MD;  Location: [facility] OR[facility];  Service: Orthopedics;  Laterality: Left;   TOTAL KNEE ARTHROPLASTY Right     Home Medications:  Allergies as of 10/01/2020       Reactions   Codeine Rash        Medication List        Accurate as of October 01, 2020  9:55 AM. If you have any questions, ask your nurse or doctor.          acetaminophen 500 MG tablet Commonly known as: TYLENOL Take 1,000 mg by mouth every 6 (six) hours as needed for moderate pain.   atorvastatin 20 MG tablet Commonly known as: LIPITOR Take 20 mg by mouth daily.   B COMPLEX-B12 PO Take 1 tablet by mouth daily.   CALCIUM ACETATE PO Take 2 tablets by mouth daily.   cyanocobalamin 1000 MCG tablet Take by mouth.   famotidine 20 MG tablet Commonly known as: PEPCID Take 20 mg by mouth at bedtime.   ferrous sulfate 325 (65 FE) MG tablet Take 325 mg by mouth daily with breakfast.   hydrochlorothiazide 12.5 MG tablet Commonly known as: HYDRODIURIL   hydrocortisone 2.5 % ointment Apply 1 application topically 2 (two) times daily.   losartan-hydrochlorothiazide 50-12.5 MG tablet Commonly known as: HYZAAR Take 1 tablet by mouth every morning.   meloxicam 15 MG tablet  Commonly known as: MOBIC Take 15 mg by mouth daily.   NONFORMULARY OR COMPOUNDED ITEM Trimix (30/1/10)-(Pap/Phent/PGE)  Test Dose  51m vial   Qty #3 [phone] [phone]  nystatin cream Commonly known as: MYCOSTATIN Apply 1 application topically 2 (two) times daily.   OSTEO BI-FLEX TRIPLE STRENGTH PO Take 1 tablet by mouth daily.   SENIOR MULTIVITAMIN PLUS PO Take 1 tablet by mouth daily.   tamsulosin 0.4 MG Caps capsule Commonly known as: FLOMAX Take 1 tablet by mouth daily.   vitamin C 1000 MG tablet Take 1,000 mg by mouth daily.   Vitamin D-3 125 MCG (5000 UT) Tabs Take 5,000 Units by mouth daily.   vitamin E  180 MG (400 UNITS) capsule Take by mouth.   zolpidem 10 MG tablet Commonly known as: AMBIEN Take 10 mg by mouth at bedtime as needed for sleep.        Allergies:  Allergies  Allergen Reactions   Codeine Rash    Family History: No family history on file.  Social History:  reports that he quit smoking about 24 years ago. His smoking use included cigarettes. He has a 60.00 pack-year smoking history. He has never used smokeless tobacco. He reports current alcohol use. He reports that he does not use drugs.  ROS: Pertinent ROS in HPI  Physical Exam: BP (!) 159/95   Pulse 79   Ht 6' (1.829 m)   Wt 190 lb (86.2 kg)   BMI 25.77 kg/m   Constitutional:  Well nourished. Alert and oriented, No acute distress. HEENT: [place] AT, mask in place.  Trachea midline Cardiovascular: No clubbing, cyanosis, or edema. Respiratory: Normal respiratory effort, no increased work of breathing. Neurologic: Grossly intact, no focal deficits, moving all 4 extremities. Psychiatric: Normal mood and affect.   Laboratory Data: Component     Latest Ref Rng & Units 08/07/2019 02/14/2020 08/11/2020  Prostate Specific Ag, Serum     0.0 - 4.0 ng/mL <0.1 <0.1 <0.1  I have reviewed the labs.   Pertinent Imaging: N/A  Assessment & Plan:    1. ED -Discussed with the patient to consider the fact that he is relatively healthy at his advanced age and that would be in his best interest to undergo the placement of the IPP at this time as he may not be healthy enough to undergo surgical procedure in the future.  I stated we could go ahead and reschedule the ICI titration appointment, but if the higher potency ICI is effective, we do not know the timeframe for how long it will be effective.  If the effectiveness of the ICI treatment wanes at a time when he is no longer considered a good surgical candidate, this may be a path he does not want to take.  We also discussed the lack of spontaneity associated with ICI.  At this  time he is decided to go ahead with the IPP placement on October 21 with Dr. FFrancesca Jewett 2. Prostate cancer -We will need follow-up in 6 months with Dr. BErlene Quanwith PSA prior  These notes generated with voice recognition software. I apologize for typographical errors.  SZara Council PA-C  [address] [place] [phone] I spent 25 minutes on the day of the encounter to include pre-visit record review, face-to-face time with the patient, and post-visit ordering of tests.

## 2020-10-01 ENCOUNTER — Other Ambulatory Visit: Payer: Self-pay

## 2020-10-01 ENCOUNTER — Encounter: Payer: Self-pay | Admitting: Urology

## 2020-10-01 ENCOUNTER — Ambulatory Visit: Payer: PPO | Admitting: Urology

## 2020-10-01 VITALS — BP 159/95 | HR 79 | Ht 72.0 in | Wt 190.0 lb

## 2020-10-01 DIAGNOSIS — N5235 Erectile dysfunction following radiation therapy: Secondary | ICD-10-CM

## 2020-10-20 DIAGNOSIS — N529 Male erectile dysfunction, unspecified: Secondary | ICD-10-CM | POA: Diagnosis not present

## 2020-11-21 DIAGNOSIS — H9193 Unspecified hearing loss, bilateral: Secondary | ICD-10-CM | POA: Diagnosis not present

## 2020-11-21 DIAGNOSIS — H539 Unspecified visual disturbance: Secondary | ICD-10-CM | POA: Diagnosis not present

## 2020-11-21 DIAGNOSIS — Z974 Presence of external hearing-aid: Secondary | ICD-10-CM | POA: Diagnosis not present

## 2020-11-21 DIAGNOSIS — M1990 Unspecified osteoarthritis, unspecified site: Secondary | ICD-10-CM | POA: Diagnosis not present

## 2020-11-21 DIAGNOSIS — Z9889 Other specified postprocedural states: Secondary | ICD-10-CM | POA: Diagnosis not present

## 2020-11-21 DIAGNOSIS — Z885 Allergy status to narcotic agent status: Secondary | ICD-10-CM | POA: Diagnosis not present

## 2020-11-21 DIAGNOSIS — Z8546 Personal history of malignant neoplasm of prostate: Secondary | ICD-10-CM | POA: Diagnosis not present

## 2020-11-21 DIAGNOSIS — N5235 Erectile dysfunction following radiation therapy: Secondary | ICD-10-CM | POA: Diagnosis not present

## 2020-11-21 DIAGNOSIS — Z972 Presence of dental prosthetic device (complete) (partial): Secondary | ICD-10-CM | POA: Diagnosis not present

## 2020-11-24 DIAGNOSIS — N529 Male erectile dysfunction, unspecified: Secondary | ICD-10-CM | POA: Diagnosis not present

## 2020-12-03 DIAGNOSIS — Z79899 Other long term (current) drug therapy: Secondary | ICD-10-CM | POA: Diagnosis not present

## 2020-12-03 DIAGNOSIS — R6882 Decreased libido: Secondary | ICD-10-CM | POA: Diagnosis not present

## 2020-12-03 DIAGNOSIS — N529 Male erectile dysfunction, unspecified: Secondary | ICD-10-CM | POA: Diagnosis not present

## 2020-12-03 DIAGNOSIS — N320 Bladder-neck obstruction: Secondary | ICD-10-CM | POA: Diagnosis not present

## 2020-12-03 DIAGNOSIS — C61 Malignant neoplasm of prostate: Secondary | ICD-10-CM | POA: Diagnosis not present

## 2020-12-03 DIAGNOSIS — Z9889 Other specified postprocedural states: Secondary | ICD-10-CM | POA: Diagnosis not present

## 2020-12-03 DIAGNOSIS — Z960 Presence of urogenital implants: Secondary | ICD-10-CM | POA: Diagnosis not present

## 2020-12-03 DIAGNOSIS — Z923 Personal history of irradiation: Secondary | ICD-10-CM | POA: Diagnosis not present

## 2020-12-15 DIAGNOSIS — R6882 Decreased libido: Secondary | ICD-10-CM | POA: Diagnosis not present

## 2020-12-31 DIAGNOSIS — N529 Male erectile dysfunction, unspecified: Secondary | ICD-10-CM | POA: Diagnosis not present

## 2020-12-31 DIAGNOSIS — R32 Unspecified urinary incontinence: Secondary | ICD-10-CM | POA: Diagnosis not present

## 2021-01-01 ENCOUNTER — Other Ambulatory Visit: Payer: PPO

## 2021-01-05 DIAGNOSIS — H2513 Age-related nuclear cataract, bilateral: Secondary | ICD-10-CM | POA: Diagnosis not present

## 2021-01-08 ENCOUNTER — Ambulatory Visit: Payer: PPO | Admitting: Radiation Oncology

## 2021-01-12 DIAGNOSIS — D696 Thrombocytopenia, unspecified: Secondary | ICD-10-CM | POA: Diagnosis not present

## 2021-01-12 DIAGNOSIS — Z125 Encounter for screening for malignant neoplasm of prostate: Secondary | ICD-10-CM | POA: Diagnosis not present

## 2021-01-12 DIAGNOSIS — R739 Hyperglycemia, unspecified: Secondary | ICD-10-CM | POA: Diagnosis not present

## 2021-01-12 DIAGNOSIS — E781 Pure hyperglyceridemia: Secondary | ICD-10-CM | POA: Diagnosis not present

## 2021-01-12 DIAGNOSIS — Z79899 Other long term (current) drug therapy: Secondary | ICD-10-CM | POA: Diagnosis not present

## 2021-01-15 DIAGNOSIS — D696 Thrombocytopenia, unspecified: Secondary | ICD-10-CM | POA: Diagnosis not present

## 2021-01-15 DIAGNOSIS — N401 Enlarged prostate with lower urinary tract symptoms: Secondary | ICD-10-CM | POA: Diagnosis not present

## 2021-01-15 DIAGNOSIS — C775 Secondary and unspecified malignant neoplasm of intrapelvic lymph nodes: Secondary | ICD-10-CM | POA: Diagnosis not present

## 2021-01-15 DIAGNOSIS — R351 Nocturia: Secondary | ICD-10-CM | POA: Diagnosis not present

## 2021-01-15 DIAGNOSIS — I700 Atherosclerosis of aorta: Secondary | ICD-10-CM | POA: Diagnosis not present

## 2021-01-15 DIAGNOSIS — C61 Malignant neoplasm of prostate: Secondary | ICD-10-CM | POA: Diagnosis not present

## 2021-01-15 DIAGNOSIS — Z79899 Other long term (current) drug therapy: Secondary | ICD-10-CM | POA: Diagnosis not present

## 2021-01-15 DIAGNOSIS — E7800 Pure hypercholesterolemia, unspecified: Secondary | ICD-10-CM | POA: Diagnosis not present

## 2021-01-15 DIAGNOSIS — I10 Essential (primary) hypertension: Secondary | ICD-10-CM | POA: Diagnosis not present

## 2021-01-15 DIAGNOSIS — Z125 Encounter for screening for malignant neoplasm of prostate: Secondary | ICD-10-CM | POA: Diagnosis not present

## 2021-01-15 DIAGNOSIS — R739 Hyperglycemia, unspecified: Secondary | ICD-10-CM | POA: Diagnosis not present

## 2021-02-07 ENCOUNTER — Encounter: Payer: Self-pay | Admitting: Urology

## 2021-02-09 DIAGNOSIS — L728 Other follicular cysts of the skin and subcutaneous tissue: Secondary | ICD-10-CM | POA: Diagnosis not present

## 2021-02-09 DIAGNOSIS — Z85828 Personal history of other malignant neoplasm of skin: Secondary | ICD-10-CM | POA: Diagnosis not present

## 2021-02-09 DIAGNOSIS — D2262 Melanocytic nevi of left upper limb, including shoulder: Secondary | ICD-10-CM | POA: Diagnosis not present

## 2021-02-09 DIAGNOSIS — D2271 Melanocytic nevi of right lower limb, including hip: Secondary | ICD-10-CM | POA: Diagnosis not present

## 2021-02-09 DIAGNOSIS — X32XXXA Exposure to sunlight, initial encounter: Secondary | ICD-10-CM | POA: Diagnosis not present

## 2021-02-09 DIAGNOSIS — L570 Actinic keratosis: Secondary | ICD-10-CM | POA: Diagnosis not present

## 2021-02-09 DIAGNOSIS — D2261 Melanocytic nevi of right upper limb, including shoulder: Secondary | ICD-10-CM | POA: Diagnosis not present

## 2021-02-19 DIAGNOSIS — Z09 Encounter for follow-up examination after completed treatment for conditions other than malignant neoplasm: Secondary | ICD-10-CM | POA: Diagnosis not present

## 2021-02-19 DIAGNOSIS — N5235 Erectile dysfunction following radiation therapy: Secondary | ICD-10-CM | POA: Diagnosis not present

## 2021-02-27 ENCOUNTER — Other Ambulatory Visit: Payer: Self-pay

## 2021-02-27 ENCOUNTER — Other Ambulatory Visit: Payer: PPO

## 2021-02-27 DIAGNOSIS — T83490A Other mechanical complication of penile (implanted) prosthesis, initial encounter: Secondary | ICD-10-CM | POA: Diagnosis not present

## 2021-02-27 DIAGNOSIS — C61 Malignant neoplasm of prostate: Secondary | ICD-10-CM

## 2021-02-27 DIAGNOSIS — N529 Male erectile dysfunction, unspecified: Secondary | ICD-10-CM | POA: Diagnosis not present

## 2021-02-27 DIAGNOSIS — R1084 Generalized abdominal pain: Secondary | ICD-10-CM | POA: Diagnosis not present

## 2021-02-27 DIAGNOSIS — N5235 Erectile dysfunction following radiation therapy: Secondary | ICD-10-CM

## 2021-02-28 LAB — PSA: Prostate Specific Ag, Serum: <0.1 ng/mL (ref 0.0–4.0)

## 2021-03-02 NOTE — Progress Notes (Signed)
03/03/21 8:48 AM   Leonard Stokes 20-Jun-1945 [id number]  Referring provider:  Derinda Late, MD [phone] [facility] [place],  [place] [address] Chief Complaint  Patient presents with   Prostate Cancer     HPI: Leonard Stokes is a 76 y.o.male with a personal history of prostate cancer, kidney stones, BPH, and erectile dysfunction, who presents today for follow-up with PSA prior.   He was first seen for evaluation of rising PSA up to 4.84.  He was also found to have a palpable nodule at the right base, T2b   He underwent prostate biopsy on 05/24/2017.  TRUS volume 82g. Prostate biopsy was consistent with high risk prostate cancer, Gleason 4+5 and 4+4 involving all cores on the right up to 96% of the tissue.  No left-sided involvement.   CT abdomen pelvis which revealed significant external lymphadenopathy bilaterally measuring up to 3 cm on the left and 1.8 cm in largest diameter on the right.  This is not appreciated on the scan in 08/2016.  Bone scan was negative for any evidence of osseous mets.  He was started on Degeralix with plans to start EBRT.  He completed whole pelvic EBRT which was well-tolerated.  Previously decided no further ADT, completed 2.5 years.   His most recent PSA on 02/27/2021 has remained undetectable.   He is s/p implant penile prosthesis on 11/21/2020 with Dr.Figler. He was recently seen by Dr.Figler on 02/27/2021 and appeared to have insufficient fluid in system. He is scheduled for removal/replacement of the entire device.   He reports today his heat flashes have stopped and his libido has always been present. He reports today that he is having trouble with his IPP.  He also mentions today that he had his testosterone checked and it was low 162 in 12/2020.  He reports he was on finasteride and flomax after his IPP surgery (developed transient postoperative urinary retention) and wonders if he should continue this  medication.  He reports today that he has been voiding fine, no issues.   PMH: Past Medical History:  Diagnosis Date   Anemia    Arthritis    Atherosclerosis of aorta ([place]) 2018   BPH (benign prostatic hyperplasia)    Cancer ([place]) 2019   prostate   Chronic kidney disease 08/2016   kidney stone   Colon cancer ([place])    on lupron   Diverticulosis of sigmoid colon    History of kidney stones    Hypertension    Leg cramps     Surgical History: Past Surgical History:  Procedure Laterality Date   APPENDECTOMY     EXTRACORPOREAL SHOCK WAVE LITHOTRIPSY Right 08/19/2016   Procedure: EXTRACORPOREAL SHOCK WAVE LITHOTRIPSY (ESWL);  Surgeon: [name], MD;  Location: [facility];  Service: Urology;  Laterality: Right;   EXTRACORPOREAL SHOCK WAVE LITHOTRIPSY Left 03/24/2017   Procedure: EXTRACORPOREAL SHOCK WAVE LITHOTRIPSY (ESWL);  Surgeon: [name], MD;  Location: [facility];  Service: Urology;  Laterality: Left;   EYE MUSCLE SURGERY     INGUINAL HERNIA REPAIR Right 12/23/2017   Direct hernia, medium Ultra Pro mesh;  Surgeon: Robert Bellow, MD;  Location: [facility];  Service: General;  Laterality: Right;   JOINT REPLACEMENT     KNEE ARTHROPLASTY Left 11/15/2016   Procedure: COMPUTER ASSISTED TOTAL KNEE ARTHROPLASTY;  Surgeon: Dereck Leep, MD;  Location: [facility];  Service: Orthopedics;  Laterality: Left;   TOTAL KNEE ARTHROPLASTY Right  Home Medications:  Allergies as of 03/03/2021       Reactions   Codeine Rash        Medication List        Accurate as of March 03, 2021  8:48 AM. If you have any questions, ask your nurse or doctor.          STOP taking these medications    hydrocortisone 2.5 % ointment Stopped by: [name], MD   nystatin cream Commonly known as: MYCOSTATIN Stopped by: [name], MD       TAKE these medications    acetaminophen 500 MG tablet Commonly known as: TYLENOL Take 1,000 mg by mouth every 6 (six) hours  as needed for moderate pain.   atorvastatin 20 MG tablet Commonly known as: LIPITOR Take by mouth.   B COMPLEX-B12 PO Take 1 tablet by mouth daily.   CALCIUM ACETATE PO Take 2 tablets by mouth daily.   cyanocobalamin 1000 MCG tablet Take by mouth.   famotidine 20 MG tablet Commonly known as: PEPCID Take 20 mg by mouth at bedtime.   ferrous sulfate 325 (65 FE) MG tablet Take 325 mg by mouth daily with breakfast.   finasteride 5 MG tablet Commonly known as: PROSCAR Take 5 mg by mouth daily.   hydrochlorothiazide 12.5 MG tablet Commonly known as: HYDRODIURIL   ipratropium 0.03 % nasal spray Commonly known as: ATROVENT SMARTSIG:1-2 Spray(s) Both Nares 3 Times Daily PRN   losartan-hydrochlorothiazide 50-12.5 MG tablet Commonly known as: HYZAAR Take 1 tablet by mouth every morning.   meloxicam 15 MG tablet Commonly known as: MOBIC Take 1 tablet by mouth daily.   NONFORMULARY OR COMPOUNDED ITEM Trimix (30/1/10)-(Pap/Phent/PGE)  Test Dose  56ml vial   Qty #3 Refills 0  [place] [phone] Fax [phone]   OSTEO BI-FLEX TRIPLE STRENGTH PO Take 1 tablet by mouth daily.   SENIOR MULTIVITAMIN PLUS PO Take 1 tablet by mouth daily.   tamsulosin 0.4 MG Caps capsule Commonly known as: FLOMAX Take 1 tablet by mouth daily.   vitamin C 1000 MG tablet Take 1,000 mg by mouth daily.   Vitamin D-3 125 MCG (5000 UT) Tabs Take 5,000 Units by mouth daily.   vitamin E 180 MG (400 UNITS) capsule Take by mouth.   zolpidem 10 MG tablet Commonly known as: AMBIEN Take 10 mg by mouth at bedtime as needed for sleep.        Allergies:  Allergies  Allergen Reactions   Codeine Rash    Family History: No family history on file.  Social History:  reports that he quit smoking about 24 years ago. His smoking use included cigarettes. He has a 60.00 pack-year smoking history. He has never used smokeless tobacco. He reports current alcohol use. He reports  that he does not use drugs.   Physical Exam: BP (!) 150/70    Pulse 71    Ht 6' (1.829 m)    Wt 191 lb (86.6 kg)    BMI 25.90 kg/m   Constitutional:  Alert and oriented, No acute distress. HEENT: [place] AT, moist mucus membranes.  Trachea midline, no masses. Cardiovascular: No clubbing, cyanosis, or edema. Respiratory: Normal respiratory effort, no increased work of breathing. Skin: No rashes, bruises or suspicious lesions. Neurologic: Grossly intact, no focal deficits, moving all 4 extremities. Psychiatric: Normal mood and affect.  Laboratory Data:  Lab Results  Component Value Date   CREATININE 0.90 10/15/2018    Assessment & Plan:    1.  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  In light of his cancer diagnosis, hesitant to want to pursue testosterone replacement therapy especially given that he has an intact libido and has an IPP in place-ultimately he is agreeable this plan  2. BPH with urinary retention  - He had an episode of retention after IPP.  -Given that he has stable urinary symptoms now, agree that it is okay for him to cut back in his Flomax to 0.1 mg as well as stop finasteride but I would have him wait to do this until after his next revision surgery in light of his perioperative urinary retention.  He is agreeable this plan.  3. Erectile dysfunction following radiation therapy -Undergo revision surgery with Dr.Figler    Return in 6 months for PSA and 1 year for PSA and follow-up.   I,Kailey Littlejohn,acting as a Education administrator for [name], MD.,have documented all relevant documentation on the behalf of [name], MD,as directed by  [name], MD while in the presence of [name], MD.  I have reviewed the above documentation for  accuracy and completeness, and I agree with the above.   [name], MD  [facility] [address] [place], [place] [address] [phone]

## 2021-03-03 ENCOUNTER — Ambulatory Visit: Payer: PPO | Admitting: Urology

## 2021-03-03 ENCOUNTER — Other Ambulatory Visit: Payer: Self-pay

## 2021-03-03 VITALS — BP 150/70 | HR 71 | Ht 72.0 in | Wt 191.0 lb

## 2021-03-03 DIAGNOSIS — C61 Malignant neoplasm of prostate: Secondary | ICD-10-CM

## 2021-03-17 DIAGNOSIS — T83490A Other mechanical complication of penile (implanted) prosthesis, initial encounter: Secondary | ICD-10-CM | POA: Diagnosis not present

## 2021-03-20 DIAGNOSIS — N329 Bladder disorder, unspecified: Secondary | ICD-10-CM | POA: Diagnosis not present

## 2021-04-15 DIAGNOSIS — N5235 Erectile dysfunction following radiation therapy: Secondary | ICD-10-CM | POA: Diagnosis not present

## 2021-04-15 DIAGNOSIS — Z4889 Encounter for other specified surgical aftercare: Secondary | ICD-10-CM | POA: Diagnosis not present

## 2021-04-15 DIAGNOSIS — R399 Unspecified symptoms and signs involving the genitourinary system: Secondary | ICD-10-CM | POA: Diagnosis not present

## 2021-05-27 DIAGNOSIS — C61 Malignant neoplasm of prostate: Secondary | ICD-10-CM | POA: Diagnosis not present

## 2021-05-27 DIAGNOSIS — N5235 Erectile dysfunction following radiation therapy: Secondary | ICD-10-CM | POA: Diagnosis not present

## 2021-05-27 DIAGNOSIS — R399 Unspecified symptoms and signs involving the genitourinary system: Secondary | ICD-10-CM | POA: Diagnosis not present

## 2021-07-14 DIAGNOSIS — Z79899 Other long term (current) drug therapy: Secondary | ICD-10-CM | POA: Diagnosis not present

## 2021-07-14 DIAGNOSIS — E7800 Pure hypercholesterolemia, unspecified: Secondary | ICD-10-CM | POA: Diagnosis not present

## 2021-07-14 DIAGNOSIS — R739 Hyperglycemia, unspecified: Secondary | ICD-10-CM | POA: Diagnosis not present

## 2021-07-14 DIAGNOSIS — I700 Atherosclerosis of aorta: Secondary | ICD-10-CM | POA: Diagnosis not present

## 2021-07-14 DIAGNOSIS — Z125 Encounter for screening for malignant neoplasm of prostate: Secondary | ICD-10-CM | POA: Diagnosis not present

## 2021-07-21 DIAGNOSIS — Z1331 Encounter for screening for depression: Secondary | ICD-10-CM | POA: Diagnosis not present

## 2021-07-21 DIAGNOSIS — Z0000 Encounter for general adult medical examination without abnormal findings: Secondary | ICD-10-CM | POA: Diagnosis not present

## 2021-08-26 DIAGNOSIS — L723 Sebaceous cyst: Secondary | ICD-10-CM | POA: Diagnosis not present

## 2021-08-31 ENCOUNTER — Other Ambulatory Visit: Payer: PPO

## 2021-08-31 DIAGNOSIS — C61 Malignant neoplasm of prostate: Secondary | ICD-10-CM

## 2021-09-01 LAB — PSA: Prostate Specific Ag, Serum: <0.1 ng/mL (ref 0.0–4.0)

## 2021-09-02 ENCOUNTER — Telehealth: Payer: Self-pay | Admitting: *Deleted

## 2021-09-02 NOTE — Telephone Encounter (Addendum)
Patient informed, voiced understanding   ----- Message from [name], MD sent at 09/01/2021  8:14 AM EDT ----- PSA remains undetectable great news  [name], MD

## 2021-12-10 DIAGNOSIS — L720 Epidermal cyst: Secondary | ICD-10-CM | POA: Diagnosis not present

## 2021-12-10 DIAGNOSIS — D492 Neoplasm of unspecified behavior of bone, soft tissue, and skin: Secondary | ICD-10-CM | POA: Diagnosis not present

## 2021-12-16 DIAGNOSIS — H903 Sensorineural hearing loss, bilateral: Secondary | ICD-10-CM | POA: Diagnosis not present

## 2021-12-16 DIAGNOSIS — H6983 Other specified disorders of Eustachian tube, bilateral: Secondary | ICD-10-CM | POA: Diagnosis not present

## 2021-12-16 DIAGNOSIS — R42 Dizziness and giddiness: Secondary | ICD-10-CM | POA: Diagnosis not present

## 2022-01-12 DIAGNOSIS — H2513 Age-related nuclear cataract, bilateral: Secondary | ICD-10-CM | POA: Diagnosis not present

## 2022-01-15 DIAGNOSIS — R739 Hyperglycemia, unspecified: Secondary | ICD-10-CM | POA: Diagnosis not present

## 2022-01-15 DIAGNOSIS — E7800 Pure hypercholesterolemia, unspecified: Secondary | ICD-10-CM | POA: Diagnosis not present

## 2022-01-15 DIAGNOSIS — Z79899 Other long term (current) drug therapy: Secondary | ICD-10-CM | POA: Diagnosis not present

## 2022-01-21 DIAGNOSIS — D696 Thrombocytopenia, unspecified: Secondary | ICD-10-CM | POA: Diagnosis not present

## 2022-01-21 DIAGNOSIS — R351 Nocturia: Secondary | ICD-10-CM | POA: Diagnosis not present

## 2022-01-21 DIAGNOSIS — Z79899 Other long term (current) drug therapy: Secondary | ICD-10-CM | POA: Diagnosis not present

## 2022-01-21 DIAGNOSIS — I10 Essential (primary) hypertension: Secondary | ICD-10-CM | POA: Diagnosis not present

## 2022-01-21 DIAGNOSIS — E7800 Pure hypercholesterolemia, unspecified: Secondary | ICD-10-CM | POA: Diagnosis not present

## 2022-01-21 DIAGNOSIS — I700 Atherosclerosis of aorta: Secondary | ICD-10-CM | POA: Diagnosis not present

## 2022-01-21 DIAGNOSIS — R7303 Prediabetes: Secondary | ICD-10-CM | POA: Diagnosis not present

## 2022-01-21 DIAGNOSIS — N401 Enlarged prostate with lower urinary tract symptoms: Secondary | ICD-10-CM | POA: Diagnosis not present

## 2022-01-21 DIAGNOSIS — C61 Malignant neoplasm of prostate: Secondary | ICD-10-CM | POA: Diagnosis not present

## 2022-01-21 DIAGNOSIS — F5101 Primary insomnia: Secondary | ICD-10-CM | POA: Diagnosis not present

## 2022-01-21 DIAGNOSIS — C775 Secondary and unspecified malignant neoplasm of intrapelvic lymph nodes: Secondary | ICD-10-CM | POA: Diagnosis not present

## 2022-03-01 DIAGNOSIS — M1811 Unilateral primary osteoarthritis of first carpometacarpal joint, right hand: Secondary | ICD-10-CM | POA: Diagnosis not present

## 2022-03-01 DIAGNOSIS — M1812 Unilateral primary osteoarthritis of first carpometacarpal joint, left hand: Secondary | ICD-10-CM | POA: Diagnosis not present

## 2022-03-01 DIAGNOSIS — M25541 Pain in joints of right hand: Secondary | ICD-10-CM | POA: Diagnosis not present

## 2022-03-01 DIAGNOSIS — M25542 Pain in joints of left hand: Secondary | ICD-10-CM | POA: Diagnosis not present

## 2022-03-02 ENCOUNTER — Other Ambulatory Visit: Payer: PPO

## 2022-03-02 DIAGNOSIS — C61 Malignant neoplasm of prostate: Secondary | ICD-10-CM | POA: Diagnosis not present

## 2022-03-03 LAB — PSA: Prostate Specific Ag, Serum: <0.1 ng/mL (ref 0.0–4.0)

## 2022-03-04 ENCOUNTER — Other Ambulatory Visit: Payer: PPO

## 2022-03-10 ENCOUNTER — Ambulatory Visit: Payer: PPO | Admitting: Urology

## 2022-03-12 DIAGNOSIS — M79641 Pain in right hand: Secondary | ICD-10-CM | POA: Diagnosis not present

## 2022-03-12 DIAGNOSIS — M79642 Pain in left hand: Secondary | ICD-10-CM | POA: Diagnosis not present

## 2022-03-16 ENCOUNTER — Ambulatory Visit (INDEPENDENT_AMBULATORY_CARE_PROVIDER_SITE_OTHER): Payer: PPO | Admitting: Urology

## 2022-03-16 ENCOUNTER — Encounter: Payer: Self-pay | Admitting: Urology

## 2022-03-16 VITALS — BP 149/86 | HR 70 | Ht 72.0 in | Wt 190.0 lb

## 2022-03-16 DIAGNOSIS — N5235 Erectile dysfunction following radiation therapy: Secondary | ICD-10-CM | POA: Diagnosis not present

## 2022-03-16 DIAGNOSIS — C61 Malignant neoplasm of prostate: Secondary | ICD-10-CM

## 2022-03-16 DIAGNOSIS — Z8546 Personal history of malignant neoplasm of prostate: Secondary | ICD-10-CM | POA: Diagnosis not present

## 2022-03-16 DIAGNOSIS — R338 Other retention of urine: Secondary | ICD-10-CM

## 2022-03-16 DIAGNOSIS — N401 Enlarged prostate with lower urinary tract symptoms: Secondary | ICD-10-CM

## 2022-03-16 MED ORDER — FINASTERIDE 5 MG PO TABS: 5.0000 mg | ORAL_TABLET | ORAL | 11 refills | Status: DC

## 2022-03-16 MED ORDER — TAMSULOSIN HCL 0.4 MG PO CAPS: 0.4000 mg | ORAL_CAPSULE | Freq: Every day | ORAL | 11 refills | Status: DC

## 2022-03-16 NOTE — Progress Notes (Signed)
I,[name],acting as a scribe for [name], MD.,have documented all relevant documentation on the behalf of [name], MD,as directed by  [name], MD while in the presence of [name], MD.   03/16/22 9:31 AM   [name] 1945/07/18 [id number]  Referring provider: Derinda Late, MD [phone] [address] [facility] [place],  [place] [address]  Chief Complaint  Patient presents with   Elevated PSA    HPI: 77 year-old male with a personal history of BPH, prostate cancer, and erectile dysfunction, who presents today for follow-up.   He was diagnosed with high risk prostate cancer in 05/2017. At the time his TRUS volume was 82 grams. Biopsy indicated Gleason 4+5 and 4+4 involving all cores on the right up to 96% of the tissue. Staging indicated bilateral external iliac lymphadenopathy up to 3 centimeters on the left and 1.8 centimeters on the right. Bone scan was negative. He was started on Degarelix.  He completed whole pelvic EBRT and completed 2.5 years of ADT. His PSA remains undetactable to date.   He also underwent a penile prosthesis in October 2022 and had a revision due to insufficient fluid in the system in January 2023. He did have transient post-operative urinary retention after his initial IPP. He was placed back on Flomax and Finasteride.  Today he reports the penile prosthesis is not working to his satisfaction, sometimes needing to pump it 40 times.  He feels like it slowly leaks rigidity.   He would like this to be resolved but would rather not have any more surgical procedures if he can avoid that.   He is still taking Finasteride and Flomax. His hot flashes have stopped. He does not feel he is emptying his bladder sufficiently.    PMH: Past Medical History:  Diagnosis Date   Anemia    Arthritis    Atherosclerosis of aorta ([place]) 2018   BPH (benign prostatic hyperplasia)    Cancer ([place]) 2019   prostate    Chronic kidney disease 08/2016   kidney stone   Colon cancer ([place])    on lupron   Diverticulosis of sigmoid colon    History of kidney stones    Hypertension    Leg cramps     Surgical History: Past Surgical History:  Procedure Laterality Date   APPENDECTOMY     EXTRACORPOREAL SHOCK WAVE LITHOTRIPSY Right 08/19/2016   Procedure: EXTRACORPOREAL SHOCK WAVE LITHOTRIPSY (ESWL);  Surgeon: [name], MD;  Location: [facility];  Service: Urology;  Laterality: Right;   EXTRACORPOREAL SHOCK WAVE LITHOTRIPSY Left 03/24/2017   Procedure: EXTRACORPOREAL SHOCK WAVE LITHOTRIPSY (ESWL);  Surgeon: [name], MD;  Location: [facility];  Service: Urology;  Laterality: Left;   EYE MUSCLE SURGERY     INGUINAL HERNIA REPAIR Right 12/23/2017   Direct hernia, medium Ultra Pro mesh;  Surgeon: Robert Bellow, MD;  Location: [facility];  Service: General;  Laterality: Right;   JOINT REPLACEMENT     KNEE ARTHROPLASTY Left 11/15/2016   Procedure: COMPUTER ASSISTED TOTAL KNEE ARTHROPLASTY;  Surgeon: Dereck Leep, MD;  Location: [facility];  Service: Orthopedics;  Laterality: Left;   TOTAL KNEE ARTHROPLASTY Right     Home Medications:  Allergies as of 03/16/2022       Reactions   Codeine Rash        Medication List        Accurate as of March 16, 2022  9:31 AM. If you  have any questions, ask your nurse or doctor.          acetaminophen 500 MG tablet Commonly known as: TYLENOL Take 1,000 mg by mouth every 6 (six) hours as needed for moderate pain.   atorvastatin 20 MG tablet Commonly known as: LIPITOR Take by mouth.   B COMPLEX-B12 PO Take 1 tablet by mouth daily.   CALCIUM ACETATE PO Take 2 tablets by mouth daily.   cyanocobalamin 1000 MCG tablet Take by mouth.   famotidine 20 MG tablet Commonly known as: PEPCID Take 20 mg by mouth at bedtime.   ferrous sulfate 325 (65 FE) MG tablet Take 325 mg by mouth daily with breakfast.   finasteride 5 MG tablet Commonly  known as: PROSCAR Take 1 tablet (5 mg total) by mouth every other day. What changed: when to take this Changed by: [name], MD   hydrochlorothiazide 12.5 MG tablet Commonly known as: HYDRODIURIL   ipratropium 0.03 % nasal spray Commonly known as: ATROVENT SMARTSIG:1-2 Spray(s) Both Nares 3 Times Daily PRN   losartan-hydrochlorothiazide 50-12.5 MG tablet Commonly known as: HYZAAR Take 1 tablet by mouth every morning.   meloxicam 15 MG tablet Commonly known as: MOBIC Take 1 tablet by mouth daily.   NONFORMULARY OR COMPOUNDED ITEM Trimix (30/1/10)-(Pap/Phent/PGE)  Test Dose  25m vial   Qty #3 Refills 0  [phone] [phone]  OSTEO BI-FLEX TRIPLE STRENGTH PO Take 1 tablet by mouth daily.   SENIOR MULTIVITAMIN PLUS PO Take 1 tablet by mouth daily.   tamsulosin 0.4 MG Caps capsule Commonly known as: FLOMAX Take 1 capsule (0.4 mg total) by mouth daily.   traMADol 50 MG tablet Commonly known as: ULTRAM Take 50 mg by mouth 3 (three) times daily.   vitamin C 1000 MG tablet Take 1,000 mg by mouth daily.   Vitamin D-3 125 MCG (5000 UT) Tabs Take 5,000 Units by mouth daily.   vitamin E 180 MG (400 UNITS) capsule Take by mouth.   zolpidem 10 MG tablet Commonly known as: AMBIEN Take 10 mg by mouth at bedtime as needed for sleep.        Allergies:  Allergies  Allergen Reactions   Codeine Rash    Family History: No family history on file.  Social History:  reports that he quit smoking about 25 years ago. His smoking use included cigarettes. He has a 60.00 pack-year smoking history. He has never used smokeless tobacco. He reports current alcohol use. He reports that he does not use drugs.   Physical Exam: BP (!) 149/86   Pulse 70   Ht 6' (1.829 m)   Wt 190 lb (86.2 kg)   BMI 25.77 kg/m   Constitutional:  Alert and oriented, No acute distress. HEENT: [place] AT, moist mucus membranes.  Trachea midline, no masses. GU: IPP  in place in adequate position. Able to operate the device without any obvious or significant difficulty to adequate level of rigidity. Skin: No rashes, bruises or suspicious lesions. Neurologic: Grossly intact, no focal deficits, moving all 4 extremities. Psychiatric: Normal mood and affect.   Assessment & Plan:    Prostate Cancer   - No evidence of disease and continue to monitor this on a Q6 month basis.   2. BPH with urinary retention   - He's doing reasonably well. Due to his libido issues, we'll cut the Finasteride to every other day and continue Flomax once daily. He's not interested in channel TURP or any other outlet  procedure. He'll let us know if his symptoms worsen.  3. Erectile dysfunction status post IPP and revision  - He is having issues inflating the device and worried it may have a leak. Demonstrated to him the proper technique with some tips on how to inflate the device. It did appear to inflate fully today in four pumps with adequate rigidity. He'll let us know later today if he notices any auto deflation and may want to seek another opinion about his prosthesis if it doesn't stay inflated.  Return in about 6 months (around 09/14/2022) for PSA check.  I have reviewed the above documentation for accuracy and completeness, and I agree with the above.   [name], MD   [facility] [address] [place], [place] [address] [phone]

## 2022-03-19 ENCOUNTER — Encounter: Payer: Self-pay | Admitting: Urology

## 2022-05-13 DIAGNOSIS — Q6672 Congenital pes cavus, left foot: Secondary | ICD-10-CM | POA: Diagnosis not present

## 2022-05-13 DIAGNOSIS — L97511 Non-pressure chronic ulcer of other part of right foot limited to breakdown of skin: Secondary | ICD-10-CM | POA: Diagnosis not present

## 2022-05-13 DIAGNOSIS — Q6671 Congenital pes cavus, right foot: Secondary | ICD-10-CM | POA: Diagnosis not present

## 2022-05-13 DIAGNOSIS — L600 Ingrowing nail: Secondary | ICD-10-CM | POA: Diagnosis not present

## 2022-05-13 DIAGNOSIS — B351 Tinea unguium: Secondary | ICD-10-CM | POA: Diagnosis not present

## 2022-05-13 DIAGNOSIS — R7303 Prediabetes: Secondary | ICD-10-CM | POA: Diagnosis not present

## 2022-05-25 DIAGNOSIS — L97511 Non-pressure chronic ulcer of other part of right foot limited to breakdown of skin: Secondary | ICD-10-CM | POA: Diagnosis not present

## 2022-05-25 DIAGNOSIS — R7303 Prediabetes: Secondary | ICD-10-CM | POA: Diagnosis not present

## 2022-07-13 DIAGNOSIS — W57XXXA Bitten or stung by nonvenomous insect and other nonvenomous arthropods, initial encounter: Secondary | ICD-10-CM | POA: Diagnosis not present

## 2022-07-13 DIAGNOSIS — R208 Other disturbances of skin sensation: Secondary | ICD-10-CM | POA: Diagnosis not present

## 2022-07-13 DIAGNOSIS — S80862A Insect bite (nonvenomous), left lower leg, initial encounter: Secondary | ICD-10-CM | POA: Diagnosis not present

## 2022-07-13 DIAGNOSIS — L820 Inflamed seborrheic keratosis: Secondary | ICD-10-CM | POA: Diagnosis not present

## 2022-07-26 DIAGNOSIS — R7303 Prediabetes: Secondary | ICD-10-CM | POA: Diagnosis not present

## 2022-07-26 DIAGNOSIS — E7800 Pure hypercholesterolemia, unspecified: Secondary | ICD-10-CM | POA: Diagnosis not present

## 2022-07-26 DIAGNOSIS — Z79899 Other long term (current) drug therapy: Secondary | ICD-10-CM | POA: Diagnosis not present

## 2022-08-11 DIAGNOSIS — D649 Anemia, unspecified: Secondary | ICD-10-CM | POA: Diagnosis not present

## 2022-08-11 DIAGNOSIS — Z0000 Encounter for general adult medical examination without abnormal findings: Secondary | ICD-10-CM | POA: Diagnosis not present

## 2022-09-10 ENCOUNTER — Other Ambulatory Visit: Payer: PPO

## 2022-09-10 DIAGNOSIS — C61 Malignant neoplasm of prostate: Secondary | ICD-10-CM

## 2022-09-14 ENCOUNTER — Other Ambulatory Visit: Payer: PPO

## 2022-10-18 DIAGNOSIS — J209 Acute bronchitis, unspecified: Secondary | ICD-10-CM | POA: Diagnosis not present

## 2022-10-18 DIAGNOSIS — I700 Atherosclerosis of aorta: Secondary | ICD-10-CM | POA: Diagnosis not present

## 2022-10-18 DIAGNOSIS — F5101 Primary insomnia: Secondary | ICD-10-CM | POA: Diagnosis not present

## 2022-10-18 DIAGNOSIS — R7303 Prediabetes: Secondary | ICD-10-CM | POA: Diagnosis not present

## 2022-10-18 DIAGNOSIS — D696 Thrombocytopenia, unspecified: Secondary | ICD-10-CM | POA: Diagnosis not present

## 2022-11-15 DIAGNOSIS — D485 Neoplasm of uncertain behavior of skin: Secondary | ICD-10-CM | POA: Diagnosis not present

## 2022-11-15 DIAGNOSIS — D2262 Melanocytic nevi of left upper limb, including shoulder: Secondary | ICD-10-CM | POA: Diagnosis not present

## 2022-11-15 DIAGNOSIS — D2261 Melanocytic nevi of right upper limb, including shoulder: Secondary | ICD-10-CM | POA: Diagnosis not present

## 2022-11-15 DIAGNOSIS — D2271 Melanocytic nevi of right lower limb, including hip: Secondary | ICD-10-CM | POA: Diagnosis not present

## 2022-11-15 DIAGNOSIS — D225 Melanocytic nevi of trunk: Secondary | ICD-10-CM | POA: Diagnosis not present

## 2022-11-15 DIAGNOSIS — D0361 Melanoma in situ of right upper limb, including shoulder: Secondary | ICD-10-CM | POA: Diagnosis not present

## 2022-11-15 DIAGNOSIS — Z85828 Personal history of other malignant neoplasm of skin: Secondary | ICD-10-CM | POA: Diagnosis not present

## 2022-11-15 DIAGNOSIS — L570 Actinic keratosis: Secondary | ICD-10-CM | POA: Diagnosis not present

## 2022-11-15 DIAGNOSIS — D2272 Melanocytic nevi of left lower limb, including hip: Secondary | ICD-10-CM | POA: Diagnosis not present

## 2022-11-15 DIAGNOSIS — L821 Other seborrheic keratosis: Secondary | ICD-10-CM | POA: Diagnosis not present

## 2022-11-25 DIAGNOSIS — D0361 Melanoma in situ of right upper limb, including shoulder: Secondary | ICD-10-CM | POA: Diagnosis not present

## 2023-01-14 DIAGNOSIS — H43811 Vitreous degeneration, right eye: Secondary | ICD-10-CM | POA: Diagnosis not present

## 2023-01-14 DIAGNOSIS — H2513 Age-related nuclear cataract, bilateral: Secondary | ICD-10-CM | POA: Diagnosis not present

## 2023-02-15 DIAGNOSIS — M25552 Pain in left hip: Secondary | ICD-10-CM | POA: Diagnosis not present

## 2023-02-15 DIAGNOSIS — G8929 Other chronic pain: Secondary | ICD-10-CM | POA: Diagnosis not present

## 2023-02-22 DIAGNOSIS — M51369 Other intervertebral disc degeneration, lumbar region without mention of lumbar back pain or lower extremity pain: Secondary | ICD-10-CM | POA: Diagnosis not present

## 2023-02-22 DIAGNOSIS — M545 Low back pain, unspecified: Secondary | ICD-10-CM | POA: Diagnosis not present

## 2023-02-28 DIAGNOSIS — G8929 Other chronic pain: Secondary | ICD-10-CM | POA: Diagnosis not present

## 2023-02-28 DIAGNOSIS — M25552 Pain in left hip: Secondary | ICD-10-CM | POA: Diagnosis not present

## 2023-02-28 DIAGNOSIS — R2689 Other abnormalities of gait and mobility: Secondary | ICD-10-CM | POA: Diagnosis not present

## 2023-02-28 DIAGNOSIS — M5416 Radiculopathy, lumbar region: Secondary | ICD-10-CM | POA: Diagnosis not present

## 2023-03-14 ENCOUNTER — Other Ambulatory Visit: Payer: Self-pay

## 2023-03-14 DIAGNOSIS — M6281 Muscle weakness (generalized): Secondary | ICD-10-CM | POA: Diagnosis not present

## 2023-03-14 DIAGNOSIS — N401 Enlarged prostate with lower urinary tract symptoms: Secondary | ICD-10-CM

## 2023-03-14 DIAGNOSIS — C61 Malignant neoplasm of prostate: Secondary | ICD-10-CM

## 2023-03-15 ENCOUNTER — Other Ambulatory Visit: Payer: PPO

## 2023-03-15 DIAGNOSIS — C61 Malignant neoplasm of prostate: Secondary | ICD-10-CM | POA: Diagnosis not present

## 2023-03-15 DIAGNOSIS — R338 Other retention of urine: Secondary | ICD-10-CM | POA: Diagnosis not present

## 2023-03-15 DIAGNOSIS — N401 Enlarged prostate with lower urinary tract symptoms: Secondary | ICD-10-CM

## 2023-03-16 LAB — PSA: Prostate Specific Ag, Serum: 3.7 ng/mL (ref 0.0–4.0)

## 2023-03-17 DIAGNOSIS — M6281 Muscle weakness (generalized): Secondary | ICD-10-CM | POA: Diagnosis not present

## 2023-03-18 DIAGNOSIS — L97511 Non-pressure chronic ulcer of other part of right foot limited to breakdown of skin: Secondary | ICD-10-CM | POA: Diagnosis not present

## 2023-03-18 DIAGNOSIS — B351 Tinea unguium: Secondary | ICD-10-CM | POA: Diagnosis not present

## 2023-03-18 DIAGNOSIS — R7303 Prediabetes: Secondary | ICD-10-CM | POA: Diagnosis not present

## 2023-03-21 DIAGNOSIS — M6281 Muscle weakness (generalized): Secondary | ICD-10-CM | POA: Diagnosis not present

## 2023-03-22 ENCOUNTER — Ambulatory Visit: Payer: PPO | Admitting: Urology

## 2023-03-22 VITALS — BP 133/87 | HR 89 | Ht 71.0 in | Wt 181.5 lb

## 2023-03-22 DIAGNOSIS — Z8546 Personal history of malignant neoplasm of prostate: Secondary | ICD-10-CM | POA: Diagnosis not present

## 2023-03-22 DIAGNOSIS — R972 Elevated prostate specific antigen [PSA]: Secondary | ICD-10-CM | POA: Diagnosis not present

## 2023-03-22 DIAGNOSIS — C61 Malignant neoplasm of prostate: Secondary | ICD-10-CM

## 2023-03-22 NOTE — Progress Notes (Addendum)
 I,[name],acting as a scribe for Leonard Scotland, MD.,have documented all relevant documentation on the behalf of Leonard Scotland, MD,as directed by  Leonard Scotland, MD while in the presence of Leonard Scotland, MD.  03/22/2023 9:05 AM   Leonard Stokes Jan 13, 1946 [id number]  Referring provider: [name], MD [phone] [address] [facility] [place],  [place] [address]  Chief Complaint  Patient presents with   Prostate Cancer    HPI: 78 year-old male with a personal history of BPH, prostate cancer, and erectile dysfunction presents today for annual follow-up.   He was diagnosed with high risk prostate cancer in 05/2017. At the time his TRUS volume was 82 grams. Biopsy indicated Gleason 4+5 and 4+4 involving all cores on the right up to 96% of the tissue. Staging indicated bilateral external iliac lymphadenopathy up to 3 centimeters on the left and 1.8 centimeters on the right. Bone scan was negative. He was started on Degarelix.  He completed whole pelvic EBRT and completed 2.5 years of ADT. His PSA remains undetactable to date.    He also underwent a penile prosthesis in October 2022 and had a revision due to insufficient fluid in the system in January 2023. He did have transient post-operative urinary retention after his initial IPP. He was placed back on Flomax and Finasteride.  His PSA has remained undetectable but has risen acutely to 3.7 as of 03/15/2023.  He mentions that in the past 8 weeks he wakes up with pain in his shoulder which cause him to not be able to lift his hand. He also has had problems with his feet and back pain.   He takes Flomax every day and Finasteride every other day. He feels his urinary symptoms are under control.     PMH: Past Medical History:  Diagnosis Date   Anemia    Arthritis    Atherosclerosis of aorta ([facility]) 2018   BPH (benign prostatic hyperplasia)    Cancer ([facility]) 2019   prostate   Chronic kidney  disease 08/2016   kidney stone   Colon cancer ([facility])    on lupron   Diverticulosis of sigmoid colon    History of kidney stones    Hypertension    Leg cramps     Surgical History: Past Surgical History:  Procedure Laterality Date   APPENDECTOMY     EXTRACORPOREAL SHOCK WAVE LITHOTRIPSY Right 08/19/2016   Procedure: EXTRACORPOREAL SHOCK WAVE LITHOTRIPSY (ESWL);  Surgeon: Leonard Scotland, MD;  Location: [facility];  Service: Urology;  Laterality: Right;   EXTRACORPOREAL SHOCK WAVE LITHOTRIPSY Left 03/24/2017   Procedure: EXTRACORPOREAL SHOCK WAVE LITHOTRIPSY (ESWL);  Surgeon: Leonard Scotland, MD;  Location: [facility];  Service: Urology;  Laterality: Left;   EYE MUSCLE SURGERY     INGUINAL HERNIA REPAIR Right 12/23/2017   Direct hernia, medium Ultra Pro mesh;  Surgeon: Earline Mayotte, MD;  Location: [facility];  Service: General;  Laterality: Right;   JOINT REPLACEMENT     KNEE ARTHROPLASTY Left 11/15/2016   Procedure: COMPUTER ASSISTED TOTAL KNEE ARTHROPLASTY;  Surgeon: Donato Heinz, MD;  Location: [facility];  Service: Orthopedics;  Laterality: Left;   TOTAL KNEE ARTHROPLASTY Right     Home Medications:  Allergies as of 03/22/2023       Reactions   Codeine Rash        Medication List        Accurate as of March 22, 2023  9:05 AM. If you  have any questions, ask your nurse or doctor.          acetaminophen 500 MG tablet Commonly known as: TYLENOL Take 1,000 mg by mouth every 6 (six) hours as needed for moderate pain.   atorvastatin 20 MG tablet Commonly known as: LIPITOR Take by mouth.   B COMPLEX-B12 PO Take 1 tablet by mouth daily.   CALCIUM ACETATE PO Take 2 tablets by mouth daily.   cyanocobalamin 1000 MCG tablet Take by mouth.   famotidine 20 MG tablet Commonly known as: PEPCID Take 20 mg by mouth at bedtime.   ferrous sulfate 325 (65 FE) MG tablet Take 325 mg by mouth daily with breakfast.   finasteride 5 MG tablet Commonly known as:  PROSCAR Take 1 tablet (5 mg total) by mouth every other day.   hydrochlorothiazide 12.5 MG tablet Commonly known as: HYDRODIURIL   ipratropium 0.03 % nasal spray Commonly known as: ATROVENT SMARTSIG:1-2 Spray(s) Both Nares 3 Times Daily PRN   losartan-hydrochlorothiazide 50-12.5 MG tablet Commonly known as: HYZAAR Take 1 tablet by mouth every morning.   meloxicam 15 MG tablet Commonly known as: MOBIC Take 1 tablet by mouth daily.   NONFORMULARY OR COMPOUNDED ITEM Trimix (30/1/10)-(Pap/Phent/PGE)  Test Dose  1ml vial   Qty #3 Refills 0  [facility] [phone] Fax [phone]   OSTEO BI-FLEX TRIPLE STRENGTH PO Take 1 tablet by mouth daily.   SENIOR MULTIVITAMIN PLUS PO Take 1 tablet by mouth daily.   tamsulosin 0.4 MG Caps capsule Commonly known as: FLOMAX Take 1 capsule (0.4 mg total) by mouth daily.   vitamin C 1000 MG tablet Take 1,000 mg by mouth daily.   Vitamin D-3 125 MCG (5000 UT) Tabs Take 5,000 Units by mouth daily.   vitamin E 180 MG (400 UNITS) capsule Take by mouth.   zolpidem 10 MG tablet Commonly known as: AMBIEN Take 10 mg by mouth at bedtime as needed for sleep.        Allergies:  Allergies  Allergen Reactions   Codeine Rash    Social History:  reports that he quit smoking about 26 years ago. His smoking use included cigarettes. He started smoking about 46 years ago. He has a 60 pack-year smoking history. He has never used smokeless tobacco. He reports current alcohol use. He reports that he does not use drugs.   Physical Exam: BP 133/87   Pulse 89   Ht 5\' 11"  (1.803 m)   Wt 181 lb 8 oz (82.3 kg)   BMI 25.31 kg/m   Constitutional:  Alert and oriented, No acute distress. HEENT: [place] AT, moist mucus membranes.  Trachea midline, no masses. Neurologic: Grossly intact, no focal deficits, moving all 4 extremities. Psychiatric: Normal mood and affect.   Assessment & Plan:    1. History of high risk prostate cancer with  rising PSA  - Recommend checking PSA again in 6 weeks. If it is still elevated he'll need a PET scan to ensure that his prostate cancer isn't progressed. If the PSA is back down, then repeat again in 6 months.   - Recent onset issues with aches and pains in shoulder and back, however primarily felt to be orthopaedic. He also has been fatigued.  He thinks could represent progressing disease although his PSA is still on the low side with unknown nadir.  Assess as above.  Low threshold to image.  2. BPH -Continue Flomax and finasteride every other day  Return in about 6 weeks (around 05/03/2023)  for PSA.  I have reviewed the above documentation for accuracy and completeness, and I agree with the above.   Leonard Scotland, MD   [facility] [address] [place], [place] [address] [phone]

## 2023-03-23 ENCOUNTER — Ambulatory Visit: Payer: PPO | Admitting: Urology

## 2023-03-23 ENCOUNTER — Other Ambulatory Visit: Payer: Self-pay | Admitting: Urology

## 2023-03-23 ENCOUNTER — Encounter: Payer: Self-pay | Admitting: Urology

## 2023-03-24 ENCOUNTER — Encounter: Payer: Self-pay | Admitting: Urology

## 2023-03-24 ENCOUNTER — Other Ambulatory Visit: Payer: Self-pay | Admitting: Urology

## 2023-03-24 NOTE — Telephone Encounter (Signed)
 See other mychart message.

## 2023-03-25 DIAGNOSIS — M6281 Muscle weakness (generalized): Secondary | ICD-10-CM | POA: Diagnosis not present

## 2023-03-28 DIAGNOSIS — M6281 Muscle weakness (generalized): Secondary | ICD-10-CM | POA: Diagnosis not present

## 2023-03-31 DIAGNOSIS — M79602 Pain in left arm: Secondary | ICD-10-CM | POA: Diagnosis not present

## 2023-03-31 DIAGNOSIS — C775 Secondary and unspecified malignant neoplasm of intrapelvic lymph nodes: Secondary | ICD-10-CM | POA: Diagnosis not present

## 2023-03-31 DIAGNOSIS — M79601 Pain in right arm: Secondary | ICD-10-CM | POA: Diagnosis not present

## 2023-03-31 DIAGNOSIS — C61 Malignant neoplasm of prostate: Secondary | ICD-10-CM | POA: Diagnosis not present

## 2023-04-12 DIAGNOSIS — M6281 Muscle weakness (generalized): Secondary | ICD-10-CM | POA: Diagnosis not present

## 2023-04-19 DIAGNOSIS — M6281 Muscle weakness (generalized): Secondary | ICD-10-CM | POA: Diagnosis not present

## 2023-04-20 DIAGNOSIS — R040 Epistaxis: Secondary | ICD-10-CM | POA: Diagnosis not present

## 2023-04-22 DIAGNOSIS — M6281 Muscle weakness (generalized): Secondary | ICD-10-CM | POA: Diagnosis not present

## 2023-04-26 DIAGNOSIS — M6281 Muscle weakness (generalized): Secondary | ICD-10-CM | POA: Diagnosis not present

## 2023-04-28 DIAGNOSIS — M6281 Muscle weakness (generalized): Secondary | ICD-10-CM | POA: Diagnosis not present

## 2023-05-03 ENCOUNTER — Other Ambulatory Visit

## 2023-05-03 ENCOUNTER — Telehealth: Payer: Self-pay | Admitting: Urology

## 2023-05-03 DIAGNOSIS — M6281 Muscle weakness (generalized): Secondary | ICD-10-CM | POA: Diagnosis not present

## 2023-05-03 DIAGNOSIS — C61 Malignant neoplasm of prostate: Secondary | ICD-10-CM

## 2023-05-03 NOTE — Telephone Encounter (Signed)
 Patient had lab appt this morning, and he called to ask if Dr. Apolinar Stokes will want to see him to discuss results. He said that he feels that she will want to see him. Please advise.

## 2023-05-04 LAB — PSA: Prostate Specific Ag, Serum: 2.7 ng/mL (ref 0.0–4.0)

## 2023-05-04 NOTE — Telephone Encounter (Signed)
 Patient advised that Dr Apolinar Junes is out of the office this week and we would be in touch next week.

## 2023-05-05 ENCOUNTER — Other Ambulatory Visit: Payer: PPO

## 2023-05-09 ENCOUNTER — Encounter: Payer: Self-pay | Admitting: Urology

## 2023-05-09 DIAGNOSIS — N401 Enlarged prostate with lower urinary tract symptoms: Secondary | ICD-10-CM

## 2023-05-09 DIAGNOSIS — C61 Malignant neoplasm of prostate: Secondary | ICD-10-CM

## 2023-05-10 NOTE — Telephone Encounter (Signed)
 Appointments scheduled

## 2023-05-12 DIAGNOSIS — M6281 Muscle weakness (generalized): Secondary | ICD-10-CM | POA: Diagnosis not present

## 2023-05-19 DIAGNOSIS — M6281 Muscle weakness (generalized): Secondary | ICD-10-CM | POA: Diagnosis not present

## 2023-05-23 DIAGNOSIS — M6281 Muscle weakness (generalized): Secondary | ICD-10-CM | POA: Diagnosis not present

## 2023-05-30 DIAGNOSIS — M6281 Muscle weakness (generalized): Secondary | ICD-10-CM | POA: Diagnosis not present

## 2023-06-07 DIAGNOSIS — Z85820 Personal history of malignant melanoma of skin: Secondary | ICD-10-CM | POA: Diagnosis not present

## 2023-06-07 DIAGNOSIS — Z86006 Personal history of melanoma in-situ: Secondary | ICD-10-CM | POA: Diagnosis not present

## 2023-06-07 DIAGNOSIS — D2272 Melanocytic nevi of left lower limb, including hip: Secondary | ICD-10-CM | POA: Diagnosis not present

## 2023-06-07 DIAGNOSIS — D2262 Melanocytic nevi of left upper limb, including shoulder: Secondary | ICD-10-CM | POA: Diagnosis not present

## 2023-06-07 DIAGNOSIS — Z85828 Personal history of other malignant neoplasm of skin: Secondary | ICD-10-CM | POA: Diagnosis not present

## 2023-06-07 DIAGNOSIS — L570 Actinic keratosis: Secondary | ICD-10-CM | POA: Diagnosis not present

## 2023-06-07 DIAGNOSIS — D2261 Melanocytic nevi of right upper limb, including shoulder: Secondary | ICD-10-CM | POA: Diagnosis not present

## 2023-06-07 DIAGNOSIS — D2271 Melanocytic nevi of right lower limb, including hip: Secondary | ICD-10-CM | POA: Diagnosis not present

## 2023-06-21 ENCOUNTER — Other Ambulatory Visit: Payer: PPO

## 2023-07-22 DIAGNOSIS — L538 Other specified erythematous conditions: Secondary | ICD-10-CM | POA: Diagnosis not present

## 2023-07-22 DIAGNOSIS — L2989 Other pruritus: Secondary | ICD-10-CM | POA: Diagnosis not present

## 2023-07-22 DIAGNOSIS — D485 Neoplasm of uncertain behavior of skin: Secondary | ICD-10-CM | POA: Diagnosis not present

## 2023-07-22 DIAGNOSIS — L820 Inflamed seborrheic keratosis: Secondary | ICD-10-CM | POA: Diagnosis not present

## 2023-08-15 DIAGNOSIS — G4719 Other hypersomnia: Secondary | ICD-10-CM | POA: Diagnosis not present

## 2023-08-15 DIAGNOSIS — Z125 Encounter for screening for malignant neoplasm of prostate: Secondary | ICD-10-CM | POA: Diagnosis not present

## 2023-08-15 DIAGNOSIS — C61 Malignant neoplasm of prostate: Secondary | ICD-10-CM | POA: Diagnosis not present

## 2023-08-15 DIAGNOSIS — E7800 Pure hypercholesterolemia, unspecified: Secondary | ICD-10-CM | POA: Diagnosis not present

## 2023-08-15 DIAGNOSIS — R7303 Prediabetes: Secondary | ICD-10-CM | POA: Diagnosis not present

## 2023-08-15 DIAGNOSIS — Z79899 Other long term (current) drug therapy: Secondary | ICD-10-CM | POA: Diagnosis not present

## 2023-08-15 DIAGNOSIS — R5383 Other fatigue: Secondary | ICD-10-CM | POA: Diagnosis not present

## 2023-08-17 ENCOUNTER — Telehealth: Payer: Self-pay | Admitting: Physician Assistant

## 2023-08-17 NOTE — Telephone Encounter (Signed)
 Patient called today regarding his concerns about his PSA that has risen substantially. He has an appointment with Sam on 08/29/23, and he wants her to know that he would like to have a PET scan done as he is very concerned. I did let him know that she is out of the office this week.

## 2023-08-29 ENCOUNTER — Ambulatory Visit: Admitting: Physician Assistant

## 2023-08-29 ENCOUNTER — Encounter: Payer: Self-pay | Admitting: Physician Assistant

## 2023-08-29 VITALS — BP 134/77 | HR 57 | Ht 77.11 in | Wt 165.4 lb

## 2023-08-29 DIAGNOSIS — C61 Malignant neoplasm of prostate: Secondary | ICD-10-CM

## 2023-08-29 LAB — MICROSCOPIC EXAMINATION

## 2023-08-29 LAB — URINALYSIS, COMPLETE
Bilirubin, UA: NEGATIVE
Glucose, UA: NEGATIVE
Ketones, UA: NEGATIVE
Leukocytes,UA: NEGATIVE
Nitrite, UA: NEGATIVE
Protein,UA: NEGATIVE
Specific Gravity, UA: 1.015 (ref 1.005–1.030)
Urobilinogen, Ur: 0.2 mg/dL (ref 0.2–1.0)
pH, UA: 6 (ref 5.0–7.5)

## 2023-08-29 LAB — BLADDER SCAN AMB NON-IMAGING

## 2023-08-29 NOTE — Progress Notes (Signed)
 08/29/2023 2:35 PM   Leonard Stokes 21-May-1945 [id number]  CC: Chief Complaint  Patient presents with   Other   HPI: Leonard Stokes is a 78 y.o. 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  He is accompanied today by his wife, son-in-law, Arleen, and daughter-in-law, Grayce via telephone.  Today he reports he recently saw Dr. Diedra with reports of fatigue and discomfort in his rib cage.  A PSA was checked, and resulted at 44.4.  Most recent PSA 3 months ago was 2.7.  He describes recent increased straining to void without abdominal distention, dysuria, or gross hematuria.  He has been having some constipation issues.  He and his wife were on a keto diet earlier this year and he has lost 10 to 20 pounds.  They feel that his recent weight loss is out of proportion to what would be expected on his keto diet.  He has also been struggling with worsening balance that is progressive throughout the day.  In-office UA today positive for trace intact blood; urine microscopy with moderate bacteria. PVR >277mL.  PMH: Past Medical History:  Diagnosis Date   Anemia    Arthritis    Atherosclerosis of aorta (HCC) 2018   BPH (benign prostatic hyperplasia)    Cancer (HCC) 2019   prostate   Chronic kidney disease 08/2016   kidney stone   Colon cancer (HCC)    on lupron    Diverticulosis of sigmoid colon    History of kidney stones    Hypertension    Leg cramps     Surgical History: Past Surgical History:  Procedure Laterality Date   APPENDECTOMY     EXTRACORPOREAL SHOCK WAVE LITHOTRIPSY Right 08/19/2016   Procedure: EXTRACORPOREAL SHOCK WAVE LITHOTRIPSY (ESWL);  Surgeon: Penne Knee, MD;  Location: [facility];  Service: Urology;  Laterality: Right;   EXTRACORPOREAL SHOCK WAVE LITHOTRIPSY Left 03/24/2017   Procedure: EXTRACORPOREAL  SHOCK WAVE LITHOTRIPSY (ESWL);  Surgeon: Penne Knee, MD;  Location: [facility];  Service: Urology;  Laterality: Left;   EYE MUSCLE SURGERY     INGUINAL HERNIA REPAIR Right 12/23/2017   Direct hernia, medium Ultra Pro mesh;  Surgeon: Dessa Reyes ORN, MD;  Location: [facility];  Service: General;  Laterality: Right;   JOINT REPLACEMENT     KNEE ARTHROPLASTY Left 11/15/2016   Procedure: COMPUTER ASSISTED TOTAL KNEE ARTHROPLASTY;  Surgeon: Mardee Lynwood SQUIBB, MD;  Location: [facility];  Service: Orthopedics;  Laterality: Left;   TOTAL KNEE ARTHROPLASTY Right     Home Medications:  Allergies as of 08/29/2023       Reactions   Codeine Rash        Medication List        Accurate as of August 29, 2023  2:35 PM. If you have any questions, ask your nurse or doctor.          acetaminophen  500 MG tablet Commonly known as: TYLENOL  Take 1,000 mg by mouth every 6 (six) hours as needed for moderate pain.   atorvastatin 20 MG tablet Commonly known as: LIPITOR Take by mouth.   B COMPLEX-B12 PO Take 1 tablet by mouth daily.   CALCIUM ACETATE PO Take 2 tablets by mouth daily.   cyanocobalamin 1000 MCG tablet Take by mouth.   famotidine  20 MG tablet Commonly known as: PEPCID  Take 20  mg by mouth at bedtime.   ferrous sulfate  325 (65 FE) MG tablet Take 325 mg by mouth daily with breakfast.   finasteride  5 MG tablet Commonly known as: PROSCAR  TAKE ONE TABLET (5 MG) BY MOUTH EVERY OTHER DAY   hydrochlorothiazide  12.5 MG tablet Commonly known as: HYDRODIURIL    ipratropium 0.03 % nasal spray Commonly known as: ATROVENT SMARTSIG:1-2 Spray(s) Both Nares 3 Times Daily PRN   losartan -hydrochlorothiazide  50-12.5 MG tablet Commonly known as: HYZAAR Take 1 tablet by mouth every morning.   meloxicam 15 MG tablet Commonly known as: MOBIC Take 1 tablet by mouth daily.   methocarbamol 500 MG tablet Commonly known as: ROBAXIN Take 500 mg by mouth.   NONFORMULARY OR COMPOUNDED  ITEM Trimix (30/1/10)-(Pap/Phent/PGE)  Test Dose  1ml vial   Qty #3 Refills 0  [facility] [phone] Fax [phone]   OSTEO BI-FLEX TRIPLE STRENGTH PO Take 1 tablet by mouth daily.   SENIOR MULTIVITAMIN PLUS PO Take 1 tablet by mouth daily.   tamsulosin  0.4 MG Caps capsule Commonly known as: FLOMAX  TAKE ONE CAPSULE DAILY 30 MINUTES AFTER THE SAME MEAL EACH DAY   vitamin C  1000 MG tablet Take 1,000 mg by mouth daily.   Vitamin D-3 125 MCG (5000 UT) Tabs Take 5,000 Units by mouth daily.   vitamin E 180 MG (400 UNITS) capsule Take by mouth.   zolpidem  10 MG tablet Commonly known as: AMBIEN  Take 10 mg by mouth at bedtime as needed for sleep.        Allergies:  Allergies  Allergen Reactions   Codeine Rash    Family History: No family history on file.  Social History:   reports that he quit smoking about 27 years ago. His smoking use included cigarettes. He started smoking about 47 years ago. He has a 60 pack-year smoking history. He has never used smokeless tobacco. He reports current alcohol use. He reports that he does not use drugs.  Physical Exam: BP 134/77   Pulse (!) 57   Ht 6' 5.11 (1.959 m)   Wt 170 lb (77.1 kg)   BMI 20.10 kg/m   Constitutional:  Alert and oriented, no acute distress, nontoxic appearing HEENT: [place], AT Cardiovascular: No clubbing, cyanosis, or edema Respiratory: Normal respiratory effort, no increased work of breathing GU: Nonpalpable bladder, no CVA tenderness. Skin: No rashes, bruises or suspicious lesions Neurologic: Grossly intact, no focal deficits, moving all 4 extremities Psychiatric: Normal mood and affect  Laboratory Data: Results for orders placed or performed in visit on 08/29/23  Microscopic Examination   Collection Time: 08/29/23  3:14 PM   Urine  Result Value Ref Range   WBC, UA 0-5 0 - 5 /hpf   RBC, Urine 0-2 0 - 2 /hpf   Epithelial Cells (non renal) 0-10 0 - 10 /hpf   Bacteria, UA Moderate (A)  None seen/Few  Urinalysis, Complete   Collection Time: 08/29/23  3:14 PM  Result Value Ref Range   Specific Gravity, UA 1.015 1.005 - 1.030   pH, UA 6.0 5.0 - 7.5   Color, UA Yellow Yellow   Appearance Ur Clear Clear   Leukocytes,UA Negative Negative   Protein,UA Negative Negative/Trace   Glucose, UA Negative Negative   Ketones, UA Negative Negative   RBC, UA Trace (A) Negative   Bilirubin, UA Negative Negative   Urobilinogen, Ur 0.2 0.2 - 1.0 mg/dL   Nitrite, UA Negative Negative   Microscopic Examination See below:   BLADDER SCAN AMB NON-IMAGING  Collection Time: 08/29/23  3:18 PM  Result Value Ref Range   Scan Result >251ml    Scan Result    Assessment & Plan:   1. Prostate cancer (HCC) (Primary) Recent rapid increase in PSA associated with fatigue, rib cage discomfort, weight loss, and increased straining to void.  We discussed my concerns for recurrence versus progression of his prostate cancer.  I agree with pursuing PSMA PET, and we discussed that these results will dictate treatment, which may include radiation, resuming ADT, and/or androgen receptor inhibitors.  We discussed the possibility of referral to the cancer center for further assistance in management.  UA rather bland, PVR is elevated but he is not in overt retention.  Will continue to monitor his residual closely.  We also agreed to repeat his PSA today. - Urinalysis, Complete - CULTURE, URINE COMPREHENSIVE - BLADDER SCAN AMB NON-IMAGING - PSA - NM PET (PSMA) SKULL TO MID THIGH; Future  Return for Will call with results.  Lucie Hones, PA-C  [facility] [place] [address] [place], [place] [address] [phone]

## 2023-08-30 ENCOUNTER — Ambulatory Visit: Payer: Self-pay | Admitting: Physician Assistant

## 2023-08-30 ENCOUNTER — Other Ambulatory Visit: Payer: Self-pay | Admitting: Physician Assistant

## 2023-08-30 DIAGNOSIS — C61 Malignant neoplasm of prostate: Secondary | ICD-10-CM

## 2023-08-30 LAB — PSA: Prostate Specific Ag, Serum: 66.9 ng/mL — ABNORMAL HIGH (ref 0.0–4.0)

## 2023-08-31 ENCOUNTER — Telehealth: Payer: Self-pay

## 2023-08-31 NOTE — Telephone Encounter (Signed)
 Patient called to see if his insurance approved the pet scan that we ordered that he has scheduled now.

## 2023-09-01 ENCOUNTER — Emergency Department

## 2023-09-01 ENCOUNTER — Other Ambulatory Visit: Payer: Self-pay

## 2023-09-01 ENCOUNTER — Encounter: Payer: Self-pay | Admitting: Emergency Medicine

## 2023-09-01 ENCOUNTER — Emergency Department
Admission: EM | Admit: 2023-09-01 | Discharge: 2023-09-01 | Disposition: A | Discharge status: Home / Self Care | Attending: Emergency Medicine | Admitting: Emergency Medicine

## 2023-09-01 DIAGNOSIS — Z8546 Personal history of malignant neoplasm of prostate: Secondary | ICD-10-CM | POA: Diagnosis not present

## 2023-09-01 DIAGNOSIS — R1030 Lower abdominal pain, unspecified: Secondary | ICD-10-CM | POA: Diagnosis present

## 2023-09-01 DIAGNOSIS — N189 Chronic kidney disease, unspecified: Secondary | ICD-10-CM | POA: Insufficient documentation

## 2023-09-01 DIAGNOSIS — I129 Hypertensive chronic kidney disease with stage 1 through stage 4 chronic kidney disease, or unspecified chronic kidney disease: Secondary | ICD-10-CM | POA: Insufficient documentation

## 2023-09-01 DIAGNOSIS — K5900 Constipation, unspecified: Secondary | ICD-10-CM | POA: Diagnosis not present

## 2023-09-01 DIAGNOSIS — R109 Unspecified abdominal pain: Secondary | ICD-10-CM | POA: Diagnosis not present

## 2023-09-01 DIAGNOSIS — K5730 Diverticulosis of large intestine without perforation or abscess without bleeding: Secondary | ICD-10-CM | POA: Diagnosis not present

## 2023-09-01 DIAGNOSIS — M546 Pain in thoracic spine: Secondary | ICD-10-CM | POA: Insufficient documentation

## 2023-09-01 DIAGNOSIS — R0781 Pleurodynia: Secondary | ICD-10-CM | POA: Diagnosis not present

## 2023-09-01 DIAGNOSIS — R599 Enlarged lymph nodes, unspecified: Secondary | ICD-10-CM | POA: Insufficient documentation

## 2023-09-01 DIAGNOSIS — I700 Atherosclerosis of aorta: Secondary | ICD-10-CM | POA: Diagnosis not present

## 2023-09-01 DIAGNOSIS — R590 Localized enlarged lymph nodes: Secondary | ICD-10-CM | POA: Diagnosis not present

## 2023-09-01 DIAGNOSIS — R1012 Left upper quadrant pain: Secondary | ICD-10-CM | POA: Diagnosis not present

## 2023-09-01 DIAGNOSIS — I10 Essential (primary) hypertension: Secondary | ICD-10-CM | POA: Diagnosis not present

## 2023-09-01 LAB — CBC
HCT: 35.2 % — ABNORMAL LOW (ref 39.0–52.0)
Hemoglobin: 11.7 g/dL — ABNORMAL LOW (ref 13.0–17.0)
MCH: 29.9 pg (ref 26.0–34.0)
MCHC: 33.2 g/dL (ref 30.0–36.0)
MCV: 90 fL (ref 80.0–100.0)
Platelets: 262 K/uL (ref 150–400)
RBC: 3.91 MIL/uL — ABNORMAL LOW (ref 4.22–5.81)
RDW: 15.9 % — ABNORMAL HIGH (ref 11.5–15.5)
WBC: 11 K/uL — ABNORMAL HIGH (ref 4.0–10.5)
nRBC: 0 % (ref 0.0–0.2)

## 2023-09-01 LAB — COMPREHENSIVE METABOLIC PANEL WITH GFR
ALT: 25 U/L (ref 0–44)
AST: 26 U/L (ref 15–41)
Albumin: 3.4 g/dL — ABNORMAL LOW (ref 3.5–5.0)
Alkaline Phosphatase: 199 U/L — ABNORMAL HIGH (ref 38–126)
Anion gap: 12 (ref 5–15)
BUN: 17 mg/dL (ref 8–23)
CO2: 23 mmol/L (ref 22–32)
Calcium: 9.2 mg/dL (ref 8.9–10.3)
Chloride: 100 mmol/L (ref 98–111)
Creatinine, Ser: 0.79 mg/dL (ref 0.61–1.24)
GFR, Estimated: >60 mL/min (ref >60)
Glucose, Bld: 127 mg/dL — ABNORMAL HIGH (ref 70–99)
Potassium: 5 mmol/L (ref 3.5–5.1)
Sodium: 135 mmol/L (ref 135–145)
Total Bilirubin: 0.7 mg/dL (ref 0.0–1.2)
Total Protein: 7.1 g/dL (ref 6.5–8.1)

## 2023-09-01 LAB — URINALYSIS, ROUTINE W REFLEX MICROSCOPIC
Bacteria, UA: NONE SEEN
Bilirubin Urine: NEGATIVE
Glucose, UA: NEGATIVE mg/dL
Ketones, ur: 20 mg/dL — AB
Leukocytes,Ua: NEGATIVE
Nitrite: NEGATIVE
Protein, ur: NEGATIVE mg/dL
Specific Gravity, Urine: >1.046 — ABNORMAL HIGH (ref 1.005–1.030)
Squamous Epithelial / HPF: 0 /HPF (ref 0–5)
pH: 5 (ref 5.0–8.0)

## 2023-09-01 LAB — LIPASE, BLOOD: Lipase: 27 U/L (ref 11–51)

## 2023-09-01 LAB — CULTURE, URINE COMPREHENSIVE

## 2023-09-01 MED ORDER — DOCUSATE SODIUM 100 MG PO CAPS: 100.0000 mg | ORAL_CAPSULE | Freq: Two times a day (BID) | ORAL | 2 refills | Status: AC

## 2023-09-01 MED ORDER — HYDROMORPHONE HCL 1 MG/ML IJ SOLN
0.5000 mg | Freq: Once | INTRAMUSCULAR | Status: AC
  Administered 2023-09-01: 0.5 mg via INTRAVENOUS
  Filled 2023-09-01: qty 0.5

## 2023-09-01 MED ORDER — CYCLOBENZAPRINE HCL 10 MG PO TABS
10.0000 mg | ORAL_TABLET | Freq: Three times a day (TID) | ORAL | 0 refills | Status: AC | PRN
Start: 2023-09-01 — End: ?

## 2023-09-01 MED ORDER — LIDOCAINE 5 % EX PTCH
1.0000 | MEDICATED_PATCH | CUTANEOUS | Status: DC
  Administered 2023-09-01: 1 via TRANSDERMAL
  Filled 2023-09-01: qty 1

## 2023-09-01 MED ORDER — SODIUM CHLORIDE 0.9 % IV BOLUS
1000.0000 mL | Freq: Once | INTRAVENOUS | Status: AC
  Administered 2023-09-01: 1000 mL via INTRAVENOUS

## 2023-09-01 MED ORDER — OXYCODONE HCL 5 MG PO TABS
5.0000 mg | ORAL_TABLET | Freq: Three times a day (TID) | ORAL | 0 refills | Status: DC | PRN
Start: 2023-09-01 — End: 2023-09-15

## 2023-09-01 MED ORDER — CYCLOBENZAPRINE HCL 10 MG PO TABS
10.0000 mg | ORAL_TABLET | Freq: Once | ORAL | Status: AC
  Administered 2023-09-01: 10 mg via ORAL
  Filled 2023-09-01: qty 1

## 2023-09-01 MED ORDER — POLYETHYLENE GLYCOL 3350 17 G PO PACK
17.0000 g | PACK | Freq: Every day | ORAL | 0 refills | Status: AC
Start: 2023-09-01 — End: ?

## 2023-09-01 MED ORDER — IOHEXOL 300 MG/ML  SOLN
75.0000 mL | Freq: Once | INTRAMUSCULAR | Status: AC | PRN
  Administered 2023-09-01: 75 mL via INTRAVENOUS

## 2023-09-01 MED ORDER — IOHEXOL 300 MG/ML  SOLN
100.0000 mL | Freq: Once | INTRAMUSCULAR | Status: AC | PRN
Start: 2023-09-01 — End: 2023-09-01
  Administered 2023-09-01: 100 mL via INTRAVENOUS

## 2023-09-01 MED ORDER — ACETAMINOPHEN 500 MG PO TABS
1000.0000 mg | ORAL_TABLET | Freq: Once | ORAL | Status: AC
  Administered 2023-09-01: 1000 mg via ORAL
  Filled 2023-09-01: qty 2

## 2023-09-01 MED ORDER — KETOROLAC TROMETHAMINE 30 MG/ML IJ SOLN
15.0000 mg | Freq: Once | INTRAMUSCULAR | Status: AC
  Administered 2023-09-01: 15 mg via INTRAVENOUS
  Filled 2023-09-01: qty 1

## 2023-09-01 MED ORDER — LIDOCAINE 5 % EX PTCH: 1.0000 | MEDICATED_PATCH | CUTANEOUS | 0 refills | Status: AC

## 2023-09-01 NOTE — Discharge Instructions (Addendum)
 Your CT incidentally noted periaortic lymphadenopathy that is concerning for metastatic disease.  This lymphadenopathy was seen in your prior CT in 2019 when you had your prostate cancer.  Please make sure to follow-up with your urologist for further management of these findings.  I have also put in a referral for oncology and they should call you this week or early next week to schedule follow-up appointment with you.  I put in a prescription for you for MiraLAX , please take 1-2 packets a day until you are having regular bowel movements.  I am also sending in the prescription for Lidoderm  patches, Flexeril , these are for your back pain.  Also giving you some oxycodone  for severe breakthrough pain.  Please do not operate heavy machinery or drive when you are on the oxycodone  and Flexeril  since it can make you sleepy.  In the meantime you can take Tylenol  650 mg every 6 hours as needed for pain.

## 2023-09-01 NOTE — ED Notes (Signed)
 Patient aware that urine sample is needed; has urinal at bedside.

## 2023-09-01 NOTE — ED Triage Notes (Signed)
 Pt via POV from home. Pt c/o lower abd pain and back pain for the past couple of days. Reports constipation for the past 5 days. Denies NV. Pt is A&OX4 and NAD

## 2023-09-01 NOTE — ED Provider Notes (Signed)
-----------------------------------------   3:38 PM on 09/01/2023 ----------------------------------------- I spoke to the hospitalist regarding the patient, they were somewhat hesitant to admit the patient just for pain control as we would not be expediting his workup as he has a PET/CT already scheduled for Tuesday.  I spoke to the patient, he was under the impression that we would have more of a workup in the hospital, I told him it would be the same test essentially is outside of the hospital but he would need to get his PET/CT usually as an outpatient.  He understands this he states he would like to try to go home with pain medication he states the last dose of pain medicine with a muscle relaxer seem to be helpful as well as the lidocaine  patch.  When he is lying still in bed he states his pain is a 0/10 but it does become severe with movement but only briefly per patient.  I think it would be reasonable to try to discharge the patient on Percocet, Flexeril  and Lidoderm  patches.  Will also place on Colace twice daily and MiraLAX  as needed.  Patient has a PET/CT scheduled for Tuesday and I discussed with the patient if his pain worsens or he is unable to tolerate the pain at home he could return to the emergency department where we could see him attempt pain control and possibly admit if not improved.  Patient agreeable to this plan.   Dorothyann Drivers, MD 09/01/23 1539

## 2023-09-01 NOTE — ED Provider Notes (Signed)
 [facility] Provider Note    Event Date/Time   First MD Initiated Contact with Patient 09/01/23 1027     (approximate)   History   Constipation   HPI  Leonard Stokes is a 78 y.o. male with history of CKD, BPH, hypertension, metastatic prostate cancer status post EBRT presenting with constipation as well as lower abdominal and lower back pain.  Last bowel movement 5 days ago.  That he has no dysuria or urinary frequency, does have to strain when he voids but that is normal for him due to his BPH.  He denies any fever or chest pain, no shortness of breath or cough, no nausea vomiting or diarrhea, no new weakness or numbness, no incontinence, no saddle anesthesia.  On independent chart review, he was seen by urology on 28 July, was noted to have a recent rise in his PSA, had noticed increased strain to void.  Was also having some constipation issues at that time.  His PVR was elevated during that visit, is felt that he was not overtly in retention so no Foley was placed.     Physical Exam   Triage Vital Signs: ED Triage Vitals  Encounter Vitals Group     BP 09/01/23 0923 (!) 144/93     Girls Systolic BP Percentile --      Girls Diastolic BP Percentile --      Boys Systolic BP Percentile --      Boys Diastolic BP Percentile --      Pulse Rate 09/01/23 0923 94     Resp 09/01/23 0923 18     Temp 09/01/23 0923 98.4 F (36.9 C)     Temp Source 09/01/23 0923 Oral     SpO2 09/01/23 0923 100 %     Weight 09/01/23 0918 165 lb (74.8 kg)     Height 09/01/23 0918 5' 11 (1.803 m)     Head Circumference --      Peak Flow --      Pain Score 09/01/23 0918 8     Pain Loc --      Pain Education --      Exclude from Growth Chart --     Most recent vital signs: Vitals:   09/01/23 1328 09/01/23 1330  BP:    Pulse:  90  Resp:  14  Temp: 98.3 F (36.8 C)   SpO2:  93%     General: Awake, no distress.  CV:  Good peripheral perfusion.  Resp:  Normal effort.   Abd:  No distention.  Soft nontender Other:  He does have tenderness to his left upper paralumbar region without midline spinal tenderness, no CVA tenderness bilaterally.  He has no saddle anesthesia, no focal weakness or numbness, post void 180cc.   ED Results / Procedures / Treatments   Labs (all labs ordered are listed, but only abnormal results are displayed) Labs Reviewed  COMPREHENSIVE METABOLIC PANEL WITH GFR - Abnormal; Notable for the following components:      Result Value   Glucose, Bld 127 (*)    Albumin 3.4 (*)    Alkaline Phosphatase 199 (*)    All other components within normal limits  CBC - Abnormal; Notable for the following components:   WBC 11.0 (*)    RBC 3.91 (*)    Hemoglobin 11.7 (*)    HCT 35.2 (*)    RDW 15.9 (*)    All other components within normal limits  URINALYSIS, ROUTINE  W REFLEX MICROSCOPIC - Abnormal; Notable for the following components:   Color, Urine YELLOW (*)    APPearance CLEAR (*)    Specific Gravity, Urine >1.046 (*)    Hgb urine dipstick SMALL (*)    Ketones, ur 20 (*)    All other components within normal limits  LIPASE, BLOOD     EKG  EKG shows,, rate 80, normal QS, normal QTc, no obvious ischemic ST elevation, T wave flattening in aVL, not significantly compared to prior   RADIOLOGY On my independent interpretation, CT without obvious SBO   PROCEDURES:  Critical Care performed: No  Procedures   MEDICATIONS ORDERED IN ED: Medications  lidocaine  (LIDODERM ) 5 % 1 patch (1 patch Transdermal Patch Applied 09/01/23 1322)  HYDROmorphone  (DILAUDID ) injection 0.5 mg (has no administration in time range)  ketorolac  (TORADOL ) 30 MG/ML injection 15 mg (has no administration in time range)  acetaminophen  (TYLENOL ) tablet 1,000 mg (has no administration in time range)  HYDROmorphone  (DILAUDID ) injection 0.5 mg (0.5 mg Intravenous Given 09/01/23 1046)  iohexol  (OMNIPAQUE ) 300 MG/ML solution 100 mL (100 mLs Intravenous Contrast  Given 09/01/23 1148)  HYDROmorphone  (DILAUDID ) injection 0.5 mg (0.5 mg Intravenous Given 09/01/23 1314)  sodium chloride  0.9 % bolus 1,000 mL (1,000 mLs Intravenous New Bag/Given 09/01/23 1313)  cyclobenzaprine  (FLEXERIL ) tablet 10 mg (10 mg Oral Given 09/01/23 1321)  iohexol  (OMNIPAQUE ) 300 MG/ML solution 75 mL (75 mLs Intravenous Contrast Given 09/01/23 1405)     IMPRESSION / MDM / ASSESSMENT AND PLAN / ED COURSE  I reviewed the triage vital signs and the nursing notes.                              Differential diagnosis includes, but is not limited to, constipation, mass, UTI, urinary tension, no midline spinal tenderness or recent falls or trauma to suggest traumatic fracture, did consider pathologic fracture but he has no tenderness to his spine, muscle strain, did consider cauda equina but he has no saddle anesthesia or incontinence, no focal weakness or numbness, no midline spinal tenderness.  Will get labs, CT, will give him some IV pain meds.  Patient's presentation is most consistent with acute presentation with potential threat to life or bodily function.  Independent interpretation of labs and imaging below.  On reassessment patient still has a lot of pain, seems like the pain is also situated at his left lower posterior rib cage, he has tenderness in the area without overlying erythema or swelling or induration.  Will add on a CT chest and give him additional IV meds.  Discussed with patient and wife about imaging and lab results including incidental findings.  Also discussed significantly enlarged periaortic adenopathy compared to prior exam that was done in 2019 when he had his prostate cancer and that is concerning for metastatic disease.  CT chest also noted enlarged by lateral hilar lymph nodes.  Patient on reassessment is requiring multiple rounds of pain medications, will plan to admit him for intractable pain.  He can be evaluated by oncology while inpatient.  The patient is on  the cardiac monitor to evaluate for evidence of arrhythmia and/or significant heart rate changes.   Clinical Course as of 09/01/23 1511  Thu Sep 01, 2023  1050 Independent review of labs, electrolytes not severely deranged, creatinine is normal, alk phos is elevated but rest of LFTs are normal, lipase is normal, mild leukocytosis, H&H is stable. [TT]  1250 CT  ABDOMEN PELVIS W CONTRAST IMPRESSION: Significantly enlarged periaortic adenopathy is noted compared to prior exam, most prominently seen in the left periaortic region, consistent with worsening metastatic disease.  Bilateral nonobstructive nephrolithiasis.  Sigmoid diverticulosis without inflammation.   [TT]  1344 Urinalysis, Routine w reflex microscopic -Urine, Clean Catch(!) Not consistent with UTI. [TT]  1511 CT Chest W Contrast IMPRESSION: 1. There are few mildly enlarged bilateral hilar lymph nodes, which are indeterminate in etiology. There are enlarged upper abdominal lymph nodes as well, better evaluated on the same-day acquired CT scan abdomen and pelvis. 2. Otherwise, no metastatic disease identified within the chest. 3. Multiple other nonacute observations, as described above.  Aortic Atherosclerosis (ICD10-I70.0).   [TT]    Clinical Course User Index [TT] Way[place], Lorelle Cummins, MD     FINAL CLINICAL IMPRESSION(S) / ED DIAGNOSES   Final diagnoses:  Constipation, unspecified constipation type  Acute left-sided thoracic back pain  Adenopathy     Rx / DC Orders   ED Discharge Orders          Ordered    Ambulatory referral to Hematology / Oncology       Comments: Your emergency department provider has referred you to see a hematology/oncology specialist. These are physicians who specialize in blood disorders and cancers, or findings concerning for cancer. You will receive a phone call from the [facility] Office to set up your appointment within 2 business days: [employer] operate Mon - Fri, 8:00 a.m. to  5:00 p.m.; closed for federally recognized holidays. Please be sure your phone is not set to block numbers during this time.   09/01/23 1440    polyethylene glycol (MIRALAX ) 17 g packet  Daily        09/01/23 1442    oxyCODONE  (ROXICODONE ) 5 MG immediate release tablet  Every 8 hours PRN        09/01/23 1507    cyclobenzaprine  (FLEXERIL ) 10 MG tablet  3 times daily PRN        09/01/23 1507    lidocaine  (LIDODERM ) 5 %  Every 24 hours        09/01/23 1507             Note:  This document was prepared using Dragon voice recognition software and may include unintentional dictation errors.    [place] Lorelle Cummins, MD 09/01/23 [id number]

## 2023-09-05 ENCOUNTER — Encounter: Payer: Self-pay | Admitting: Oncology

## 2023-09-05 ENCOUNTER — Inpatient Hospital Stay: Attending: Oncology | Admitting: Oncology

## 2023-09-05 ENCOUNTER — Inpatient Hospital Stay

## 2023-09-05 ENCOUNTER — Other Ambulatory Visit

## 2023-09-05 VITALS — BP 111/75 | HR 96 | Temp 96.0°F | Resp 18 | Wt 164.6 lb

## 2023-09-05 DIAGNOSIS — C775 Secondary and unspecified malignant neoplasm of intrapelvic lymph nodes: Secondary | ICD-10-CM | POA: Diagnosis not present

## 2023-09-05 DIAGNOSIS — C61 Malignant neoplasm of prostate: Secondary | ICD-10-CM | POA: Insufficient documentation

## 2023-09-05 DIAGNOSIS — C7951 Secondary malignant neoplasm of bone: Secondary | ICD-10-CM | POA: Insufficient documentation

## 2023-09-05 DIAGNOSIS — Z87891 Personal history of nicotine dependence: Secondary | ICD-10-CM | POA: Insufficient documentation

## 2023-09-05 LAB — PSA: Prostatic Specific Antigen: 75.44 ng/mL — ABNORMAL HIGH (ref 0.00–4.00)

## 2023-09-05 NOTE — Progress Notes (Unsigned)
 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  CT images were reviewed and discussed with patient.  He has developed enlarged periaortic adenopathy, suspicious for prostate cancer recurrence. Awaiting PSMA PET scan for further evaluation   Orders Placed This Encounter  Procedures   PSA    Standing Status:   Future    Number of Occurrences:   1    Expected Date:   09/05/2023    Expiration Date:   09/04/2024    All questions were answered. The patient knows to call the clinic with any problems, questions or concerns.  Zelphia Cap, MD, PhD [facility] Hematology Oncology 09/05/2023    HISTORY OF PRESENTING ILLNESS:  [facility] 78 y.o. male presents to establish care for prostate cancer I have reviewed his chart and materials related to his cancer extensively and collaborated history with the patient. 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 At that time, biopsy showed Gleason 4+5 and 4+4 involving all cores on the right up to 96% of the prostate.  Staging indicated bilateral external iliac lymphadenopathy up to 3 cm on the left and 1.8 cm on the right.  Bone scan was negative.  Patient was started on adjuvant deprivation therapy, status post EBRT and he completed 2.5 years of ADT.  PSA had remained undetectable until 08/29/2023-PSA has increased to 66.    09/01/2023 Imaging   Patient went to emergency room for evaluation of constipation and abdominal/lower back pain.  As part of the  workup, patient had a CT chest abdomen pelvis done.  CT chest showed mildly enlarged bilateral hilar lymph node which are indeterminate.  CT abdomen pelvis with contrast showed Significantly enlarged left periaortic adenopathy, most prominently seen in the left periaortic region, bilateral nonobstructive nephrolithiasis, sigmoid diverticulosis without inflammation   09/05/2023 Cancer Staging   Staging form: Prostate, AJCC 8th Edition - Clinical stage from 09/05/2023: Stage Unknown (rcTX, rcNX) - Signed by Cap Zelphia, MD on 09/05/2023 Stage prefix: Recurrence    Patient presents to establish care.  Accompanied by wife. He reports feeling well at baseline.  Constipation/abdominal pain symptoms have resolved. 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  He walks with a cane. PSMA PET scan was scheduled for tomorrow.    MEDICAL HISTORY:  Past Medical History:  Diagnosis Date   Anemia    Arthritis    Atherosclerosis of aorta (HCC) 2018   BPH (benign prostatic hyperplasia)    Cancer (HCC) 2019   prostate   Chronic kidney disease 08/2016   kidney stone   Diverticulosis of sigmoid colon    History of kidney stones    Hypertension    Leg cramps     SURGICAL HISTORY: Past Surgical History:  Procedure Laterality Date   APPENDECTOMY     EXTRACORPOREAL SHOCK WAVE LITHOTRIPSY Right 08/19/2016   Procedure: EXTRACORPOREAL SHOCK WAVE LITHOTRIPSY (ESWL);  Surgeon: Penne Knee, MD;  Location: [facility] ORS;  Service: Urology;  Laterality: Right;   EXTRACORPOREAL SHOCK WAVE LITHOTRIPSY Left 03/24/2017   Procedure: EXTRACORPOREAL SHOCK WAVE LITHOTRIPSY (ESWL);  Surgeon: Penne Knee, MD;  Location: [facility] ORS;  Service: Urology;  Laterality: Left;   EYE MUSCLE SURGERY     INGUINAL HERNIA REPAIR Right 12/23/2017   Direct hernia, medium Ultra Pro mesh;  Surgeon: Dessa Reyes ORN, MD;  Location: [facility] ORS;  Service: General;  Laterality: Right;   JOINT REPLACEMENT     KNEE ARTHROPLASTY Left  11/15/2016   Procedure: COMPUTER ASSISTED TOTAL KNEE ARTHROPLASTY;  Surgeon: Mardee Lynwood SQUIBB, MD;  Location: [facility] ORS;  Service: Orthopedics;  Laterality: Left;   TOTAL KNEE ARTHROPLASTY Right     SOCIAL HISTORY: Social History   Socioeconomic History   Marital status: Married    Spouse name: Not on file   Number of children: Not on file   Years of education: Not on file   Highest education level: Not on file  Occupational History   Not on file  Tobacco Use   Smoking status: Former    Current packs/day: 0.00    Average packs/day: 3.0 packs/day for 20.0 years (60.0 ttl pk-yrs)    Types: Cigarettes    Start date: 05/31/1976    Quit date: 05/31/1996    Years since quitting: 27.2   Smokeless tobacco: Never  Vaping Use   Vaping status: Never Used  Substance and Sexual Activity   Alcohol use: Yes    Comment: 4-5 week   Drug use: No   Sexual activity: Yes  Other Topics Concern   Not on file  Social History Narrative   ** Merged History Encounter **       Social Drivers of Health   Financial Resource Strain: Low Risk  (09/05/2023)   Overall Financial Resource Strain (CARDIA)    Difficulty of Paying Living Expenses: Not very hard  Food Insecurity: No Food Insecurity (09/05/2023)   Hunger Vital Sign    Worried About Running Out of Food in the Last Year: Never true    Ran Out of Food in the Last Year: Never true  Transportation Needs: No Transportation Needs (09/05/2023)   PRAPARE - Administrator, Civil Service (Medical): No    Lack of Transportation (Non-Medical): No  Physical Activity: Not on file  Stress: No Stress Concern Present (09/05/2023)   [employer] of Occupational Health - Occupational Stress Questionnaire    Feeling of Stress: Not at all  Social Connections: Not on file  Intimate Partner Violence: Not At Risk (09/05/2023)   Humiliation, Afraid, Rape, and Kick questionnaire    Fear of Current or Ex-Partner: No    Emotionally Abused: No    Physically  Abused: No    Sexually Abused: No    FAMILY HISTORY: History reviewed. No pertinent family history.  ALLERGIES:  is allergic to codeine.  MEDICATIONS:  Current Outpatient Medications  Medication Sig Dispense Refill   acetaminophen  (TYLENOL ) 500 MG tablet Take 1,000 mg by mouth every 6 (six) hours as needed for moderate pain.      Ascorbic Acid  (VITAMIN C ) 1000 MG tablet Take 1,000 mg by mouth daily.     atorvastatin (LIPITOR) 20 MG tablet Take by mouth.     B Complex Vitamins (B COMPLEX-B12 PO) Take 1 tablet by mouth daily.     Calcium Acetate, Phos Binder, (CALCIUM ACETATE PO) Take 2 tablets by mouth daily.     Cholecalciferol  (VITAMIN D-3) 125 MCG (5000 UT) TABS Take 5,000 Units by mouth daily.     cyanocobalamin 1000 MCG tablet Take by mouth.     cyclobenzaprine  (FLEXERIL ) 10 MG tablet Take 1 tablet (  10 mg total) by mouth 3 (three) times daily as needed for muscle spasms. 30 tablet 0   docusate sodium  (COLACE) 100 MG capsule Take 1 capsule (100 mg total) by mouth 2 (two) times daily. 60 capsule 2   famotidine  (PEPCID ) 20 MG tablet Take 20 mg by mouth at bedtime.     ferrous sulfate  325 (65 FE) MG tablet Take 325 mg by mouth daily with breakfast.     finasteride  (PROSCAR ) 5 MG tablet TAKE ONE TABLET (5 MG) BY MOUTH EVERY OTHER DAY 30 tablet 11   hydrochlorothiazide  (HYDRODIURIL ) 12.5 MG tablet      ipratropium (ATROVENT) 0.03 % nasal spray SMARTSIG:1-2 Spray(s) Both Nares 3 Times Daily PRN     lidocaine  (LIDODERM ) 5 % Place 1 patch onto the skin daily. Remove & Discard patch within 12 hours or as directed by MD 30 patch 0   losartan -hydrochlorothiazide  (HYZAAR) 50-12.5 MG tablet Take 1 tablet by mouth every morning.      meloxicam (MOBIC) 15 MG tablet Take 1 tablet by mouth daily.     methocarbamol (ROBAXIN) 500 MG tablet Take 500 mg by mouth.     Misc Natural Products (OSTEO BI-FLEX TRIPLE STRENGTH PO) Take 1 tablet by mouth daily.      Multiple Vitamins-Minerals (SENIOR  MULTIVITAMIN PLUS PO) Take 1 tablet by mouth daily.     NONFORMULARY OR COMPOUNDED ITEM Trimix (30/1/10)-(Pap/Phent/PGE)  Test Dose  1ml vial   Qty #3 Refills 0  [facility] [phone] Fax [phone] 3 each 0   oxyCODONE  (ROXICODONE ) 5 MG immediate release tablet Take 1 tablet (5 mg total) by mouth every 8 (eight) hours as needed. 12 tablet 0   polyethylene glycol (MIRALAX ) 17 g packet Take 17 g by mouth daily. 30 packet 0   tamsulosin  (FLOMAX ) 0.4 MG CAPS capsule TAKE ONE CAPSULE DAILY 30 MINUTES AFTER THE SAME MEAL EACH DAY 30 capsule 11   vitamin E 180 MG (400 UNITS) capsule Take by mouth.     zolpidem  (AMBIEN ) 10 MG tablet Take 10 mg by mouth at bedtime as needed for sleep.      No current facility-administered medications for this visit.    Review of Systems - Oncology   PHYSICAL EXAMINATION: ECOG PERFORMANCE STATUS: {CHL ONC ECOG ED:[id number]}  Vitals:   09/05/23 0931  BP: 111/75  Pulse: 96  Resp: 18  Temp: (!) 96 F (35.6 C)  SpO2: 100%   Filed Weights   09/05/23 0931  Weight: 164 lb 9.6 oz (74.7 kg)    Physical Exam   LABORATORY DATA:  I have reviewed the data as listed    Latest Ref Rng & Units 09/01/2023    9:19 AM 10/15/2018    4:30 PM 12/15/2017   11:07 AM  CBC  WBC 4.0 - 10.5 K/uL 11.0  3.9    Hemoglobin 13.0 - 17.0 g/dL 88.2  88.4  87.3   Hematocrit 39.0 - 52.0 % 35.2  33.2    Platelets 150 - 400 K/uL 262  211        Latest Ref Rng & Units 09/01/2023    9:19 AM 10/15/2018    4:30 PM 12/15/2017   11:07 AM  CMP  Glucose 70 - 99 mg/dL 872  892    BUN 8 - 23 mg/dL 17  24    Creatinine 9.38 - 1.24 mg/dL 9.20  9.09    Sodium 864 - 145 mmol/L 135  138    Potassium 3.5 - 5.1  mmol/L 5.0  4.4  4.3   Chloride 98 - 111 mmol/L 100  105    CO2 22 - 32 mmol/L 23  24    Calcium 8.9 - 10.3 mg/dL 9.2  9.6    Total Protein 6.5 - 8.1 g/dL 7.1     Total Bilirubin 0.0 - 1.2 mg/dL 0.7     Alkaline Phos 38 - 126 U/L 199     AST 15 - 41 U/L 26      ALT 0 - 44 U/L 25        RADIOGRAPHIC STUDIES: I have personally reviewed the radiological images as listed and agreed with the findings in the report. CT Chest W Contrast Result Date: 09/01/2023 CLINICAL DATA:  left posterior rib pain. History of prostate carcinoma. * Tracking Code: BO * EXAM: CT CHEST WITH CONTRAST TECHNIQUE: Multidetector CT imaging of the chest was performed during intravenous contrast administration. RADIATION DOSE REDUCTION: This exam was performed according to the departmental dose-optimization program which includes automated exposure control, adjustment of the mA and/or kV according to patient size and/or use of iterative reconstruction technique. CONTRAST:  75mL OMNIPAQUE  IOHEXOL  300 MG/ML  SOLN COMPARISON:  CT scan chest from 10/02/2014 and PET-CT scan from 06/23/2017. FINDINGS: Cardiovascular: Normal cardiac size. No pericardial effusion. No aortic aneurysm. There are coronary artery calcifications, in keeping with coronary artery disease. There are also mild-to-moderate peripheral atherosclerotic vascular calcifications of thoracic aorta and its major branches. Mediastinum/Nodes: Visualized thyroid  gland appears grossly unremarkable. No solid / cystic mediastinal masses. The esophagus is nondistended precluding optimal assessment. There are few mildly enlarged bilateral hilar lymph nodes with largest in the right hilum measuring up to 10 x 17 mm. These are indeterminate in etiology. No axillary or mediastinal lymphadenopathy by size criteria. Lungs/Pleura: The central tracheo-bronchial tree is patent. There are patchy areas of linear, plate-like atelectasis and/or scarring throughout bilateral lungs. No mass or consolidation. No pleural effusion or pneumothorax. No suspicious lung nodules. There are multiple sub 5 mm noncalcified opacities in the minor fissure (marked with electronic arrow sign on series 2005), favored to represent intra fissural lymph nodes. No suspicious lung  nodules. Upper Abdomen: There is left para-aortic conglomerate nodal mass measuring up to 2.4 x 3.1 cm. There are additional retroperitoneal lymph nodes as well. Please refer to same-day acquired CT scan abdomen and pelvis report for additional details. Remaining visualized upper abdominal viscera within normal limits. Musculoskeletal: The visualized soft tissues of the chest wall are grossly unremarkable. No suspicious osseous lesions. There are mild multilevel degenerative changes in the visualized spine. IMPRESSION: 1. There are few mildly enlarged bilateral hilar lymph nodes, which are indeterminate in etiology. There are enlarged upper abdominal lymph nodes as well, better evaluated on the same-day acquired CT scan abdomen and pelvis. 2. Otherwise, no metastatic disease identified within the chest. 3. Multiple other nonacute observations, as described above. Aortic Atherosclerosis (ICD10-I70.0). Electronically Signed   By: Ree Molt M.D.   On: 09/01/2023 15:05   CT ABDOMEN PELVIS W CONTRAST Result Date: 09/01/2023 CLINICAL DATA:  Acute lower abdominal pain. History of prostate cancer. EXAM: CT ABDOMEN AND PELVIS WITH CONTRAST TECHNIQUE: Multidetector CT imaging of the abdomen and pelvis was performed using the standard protocol following bolus administration of intravenous contrast. RADIATION DOSE REDUCTION: This exam was performed according to the departmental dose-optimization program which includes automated exposure control, adjustment of the mA and/or kV according to patient size and/or use of iterative reconstruction technique. CONTRAST:  100mL OMNIPAQUE  IOHEXOL   300 MG/ML  SOLN COMPARISON:  Jun 03, 2017. FINDINGS: Lower chest: No acute abnormality. Hepatobiliary: Grossly stable low densities are noted in the hepatic parenchyma most consistent with cysts. No gallstones, gallbladder wall thickening, or biliary dilatation. Pancreas: Unremarkable. No pancreatic ductal dilatation or surrounding  inflammatory changes. Spleen: Normal in size without focal abnormality. Adrenals/Urinary Tract: Adrenal glands are unremarkable. Bilateral nonobstructive nephrolithiasis. No hydronephrosis or renal obstruction is noted. Bladder is unremarkable. Stomach/Bowel: The stomach is unremarkable. Status post appendectomy. No evidence of bowel obstruction or inflammation. Sigmoid diverticulosis without inflammation. Moderate amount of stool seen throughout the colon. Vascular/Lymphatic: Aortic atherosclerosis. Significantly enlarged periaortic adenopathy is noted compared with prior exam. This is most prominently seen in the left periaortic region, with the largest conglomerate measuring 2.5 x 2.0 cm. This is consistent with metastatic disease. Reproductive: Prostate is unremarkable in size. Penile implant reservoir is noted anteriorly in left side of pelvis. Other: No ascites or hernia is noted. Musculoskeletal: Minimal grade 1 anterolisthesis is noted secondary to bilateral L5 spondylolysis. IMPRESSION: Significantly enlarged periaortic adenopathy is noted compared to prior exam, most prominently seen in the left periaortic region, consistent with worsening metastatic disease. Bilateral nonobstructive nephrolithiasis. Sigmoid diverticulosis without inflammation. Aortic Atherosclerosis (ICD10-I70.0). Electronically Signed   By: Lynwood Landy Raddle M.D.   On: 09/01/2023 12:22

## 2023-09-05 NOTE — Assessment & Plan Note (Signed)
 PSA is persistently elevated.  CT images were reviewed and discussed with patient.  He has developed enlarged periaortic adenopathy, suspicious for prostate cancer recurrence. Awaiting PSMA PET scan for further evaluation

## 2023-09-06 ENCOUNTER — Ambulatory Visit: Payer: Self-pay | Admitting: Oncology

## 2023-09-06 ENCOUNTER — Encounter: Payer: Self-pay | Admitting: Oncology

## 2023-09-06 ENCOUNTER — Ambulatory Visit
Admission: RE | Admit: 2023-09-06 | Discharge: 2023-09-06 | Disposition: A | Discharge status: Home / Self Care | Source: Ambulatory Visit | Attending: Physician Assistant | Admitting: Physician Assistant

## 2023-09-06 DIAGNOSIS — C61 Malignant neoplasm of prostate: Secondary | ICD-10-CM | POA: Diagnosis not present

## 2023-09-06 MED ORDER — FLOTUFOLASTAT F 18 GALLIUM 296-5846 MBQ/ML IV SOLN
8.6500 | Freq: Once | INTRAVENOUS | Status: AC
  Administered 2023-09-06: 8.65 via INTRAVENOUS
  Filled 2023-09-06: qty 9

## 2023-09-07 ENCOUNTER — Other Ambulatory Visit: Payer: Self-pay | Admitting: Physician Assistant

## 2023-09-07 DIAGNOSIS — C61 Malignant neoplasm of prostate: Secondary | ICD-10-CM

## 2023-09-07 NOTE — Progress Notes (Signed)
 I spoke with the patient and his wife via telephone.  They have already spoken with Dr. Layvonne staff and understand his PET scan results, showing diffuse bony metastases presumably of prostatic origin.  Agree with plans for biopsy and initiation of ADT per her team.  They previously saw family friend Dr Curt at [facility] at the time of his initial prostate cancer diagnosis and appreciated his second opinion. They would like to get his thoughts again given his disease progression. I placed a referral today. Patient still wishes to pursue biopsy and ADT with Dr. Babara in the interim.

## 2023-09-07 NOTE — Telephone Encounter (Signed)
-----   Message from Zelphia Cap sent at 09/06/2023 11:29 PM EDT ----- Please let patient know that PSA is persistently elevated.  PET scan results showed metastatic disease in bone and lymph nodes.  I recommend biopsy of right supraclavicular lymph node.  Please schedule him to see me 2-3 days after biopsy, MD + Firmagon . Thank you  ----- Message ----- From: Rebecka, [facility] [place] Sent: 09/05/2023   8:54 PM EDT To: Zelphia Cap, MD

## 2023-09-07 NOTE — Telephone Encounter (Signed)
 Spoke to pt and his wife and relayed results and MD recommendation. They are agreeable to proceed with bx. Request for bx sent to IR.

## 2023-09-09 ENCOUNTER — Ambulatory Visit
Admission: RE | Admit: 2023-09-09 | Discharge: 2023-09-09 | Disposition: A | Discharge status: Home / Self Care | Source: Ambulatory Visit | Attending: Nurse Practitioner | Admitting: Nurse Practitioner

## 2023-09-09 ENCOUNTER — Ambulatory Visit: Admission: RE | Admit: 2023-09-09 | Source: Ambulatory Visit

## 2023-09-09 ENCOUNTER — Other Ambulatory Visit: Payer: Self-pay | Admitting: Nurse Practitioner

## 2023-09-09 DIAGNOSIS — R590 Localized enlarged lymph nodes: Secondary | ICD-10-CM | POA: Diagnosis present

## 2023-09-09 DIAGNOSIS — C61 Malignant neoplasm of prostate: Secondary | ICD-10-CM | POA: Diagnosis not present

## 2023-09-09 DIAGNOSIS — C770 Secondary and unspecified malignant neoplasm of lymph nodes of head, face and neck: Secondary | ICD-10-CM | POA: Diagnosis not present

## 2023-09-09 DIAGNOSIS — C801 Malignant (primary) neoplasm, unspecified: Secondary | ICD-10-CM | POA: Diagnosis not present

## 2023-09-09 DIAGNOSIS — R599 Enlarged lymph nodes, unspecified: Secondary | ICD-10-CM | POA: Diagnosis not present

## 2023-09-09 MED ORDER — LIDOCAINE HCL (PF) 1 % IJ SOLN
10.0000 mL | Freq: Once | INTRAMUSCULAR | Status: AC
  Administered 2023-09-09: 10 mL via INTRADERMAL
  Filled 2023-09-09: qty 10

## 2023-09-09 NOTE — Procedures (Signed)
 Interventional Radiology Procedure:   Indications: Evidence for metastatic prostate cancer with PET  Procedure: US  guided core biopsy of left supraclavicular lymph node  Findings: Core biopsies from enlarged left supraclavicular node.  Specimens placed on Telfa pad with saline.   Complications: None     EBL: Minimal  Plan: Discharge to home.   Primrose Oler R. Philip, MD  Pager: [phone]

## 2023-09-09 NOTE — Progress Notes (Signed)
 Philip Cornet, MD sent to [facility] US  guided core biopsy of LEFT supraclavicular lymph node.  Sedation is optional.  Philip

## 2023-09-12 LAB — SURGICAL PATHOLOGY

## 2023-09-12 NOTE — Progress Notes (Signed)
 Hughes Simmonds, MD sent to [facility] Cc: Babara Call, MD PROCEDURE / BIOPSY REVIEW Date: 09/08/23  Requested Biopsy site: Supraclavicular LN, LEFT Reason for request: HM on PET. Hx prostate CA Imaging review: Best seen on PSMA PET  Decision: Approved Imaging modality to perform: Ultrasound Schedule with: No sedation / Local anesthetic Schedule for: Any VIR  Additional comments: @VIR : LEFT supraclavicular LNs, HM on PET @Schedulers . US  LEFT supraclavicular LN Bx. Local only  Please contact me with questions, concerns, or if issue pertaining to this request arise.  Simmonds Hughes, MD [facility]

## 2023-09-15 ENCOUNTER — Encounter: Payer: Self-pay | Admitting: Oncology

## 2023-09-15 ENCOUNTER — Inpatient Hospital Stay: Admitting: Oncology

## 2023-09-15 ENCOUNTER — Inpatient Hospital Stay

## 2023-09-15 ENCOUNTER — Telehealth: Payer: Self-pay

## 2023-09-15 VITALS — BP 118/66 | HR 102 | Temp 97.5°F | Resp 16 | Wt 160.0 lb

## 2023-09-15 DIAGNOSIS — Z79818 Long term (current) use of other agents affecting estrogen receptors and estrogen levels: Secondary | ICD-10-CM | POA: Insufficient documentation

## 2023-09-15 DIAGNOSIS — G893 Neoplasm related pain (acute) (chronic): Secondary | ICD-10-CM | POA: Diagnosis not present

## 2023-09-15 DIAGNOSIS — C61 Malignant neoplasm of prostate: Secondary | ICD-10-CM

## 2023-09-15 MED ORDER — OXYCODONE HCL 5 MG PO TABS: 5.0000 mg | ORAL_TABLET | Freq: Three times a day (TID) | ORAL | 0 refills | Status: DC | PRN

## 2023-09-15 MED ORDER — DEGARELIX ACETATE(240 MG DOSE) 120 MG/VIAL ~~LOC~~ SOLR
240.0000 mg | Freq: Once | SUBCUTANEOUS | Status: AC
Start: 2023-09-15 — End: 2023-09-15
  Administered 2023-09-15: 240 mg via SUBCUTANEOUS
  Filled 2023-09-15: qty 6

## 2023-09-15 NOTE — Assessment & Plan Note (Addendum)
 09/09/2023 PSMA PET showed wildly metastatic bone and lymphadenopathy, consistent with recurrent prostate cancer, castration sensitive, metastatic.  Heterogeneous hypermetabolic area in liver, no discrete liver lesions.  Indeterminate. Right supraclavicular lymph node biopsy confirmed metastatic adenocarcinoma consistent with prostate origin. Recommend androgen deprivation therapy.  He will start with Firmagon  loading dose today. Chemotherapy versus ARPI options were reviewed and discussed with patient. Given patient's performance status and age, she decision was made to proceed with ARPI.  Will check insurance coverage for Darolutamide  or enzalutamide  Referred to palliative care service.

## 2023-09-15 NOTE — Telephone Encounter (Signed)
 Completed Attending physician statement for patient's travel insurance. Form faxed to [employer] @ [phone]. Copy given to pt and copy placed in HIM bin for scanning.

## 2023-09-15 NOTE — Progress Notes (Signed)
 Hematology/Oncology Progress note [phone] [phone]        REFERRING PROVIDER: Diedra Lame, MD    CHIEF COMPLAINTS/PURPOSE OF CONSULTATION:  Castration sensitive metastatic prostate cancer  ASSESSMENT & PLAN:   Cancer Staging  Prostate cancer metastatic to multiple sites [employer]) Staging form: Prostate, AJCC 8th Edition - Clinical stage from 09/05/2023: Stage IVB (rcTX, rcN1, rcM1, PSA: 75) - Signed by Babara Call, MD on 09/06/2023   Prostate cancer metastatic to multiple sites [facility]) 09/09/2023 PSMA PET showed wildly metastatic bone and lymphadenopathy, consistent with recurrent prostate cancer, castration sensitive, metastatic.  Heterogeneous hypermetabolic area in liver, no discrete liver lesions.  Indeterminate. Right supraclavicular lymph node biopsy confirmed metastatic adenocarcinoma consistent with prostate origin. Recommend androgen deprivation therapy.  He will start with Firmagon  loading dose today. Chemotherapy versus ARPI options were reviewed and discussed with patient. Given patient's performance status and age, she decision was made to proceed with ARPI.  Will check insurance coverage for Darolutamide  or enzalutamide  Referred to palliative care service.     Encounter for monitoring androgen deprivation therapy Loading dose Firmagon  today.  Rationale potential side effects were reviewed with patient  Neoplasm related pain Recommend oxycodone  5 mg every 8 hours as needed pain.   Orders Placed This Encounter  Procedures   Ambulatory Referral to Palliative Care    Referral Priority:   Routine    Referral Type:   Consultation    Referral Reason:   Goals of Care    Number of Visits Requested:   1   We spent sufficient time to discuss many aspect of care, questions were answered to patient's satisfaction. A total of 40 minutes was spent on this visit.  With 5 minutes spent reviewing image findings, pathology reports, 30 minutes counseling  the patient on the diagnosis, goal of care, chemotherapy treatments, side effects of the treatment, management of symptoms.  Additional 5 minutes was spent on answering patient's questions.  All questions were answered. The patient knows to call the clinic with any problems, questions or concerns.  Call Babara, MD, PhD [facility] Hematology Oncology 09/15/2023    HISTORY OF PRESENTING ILLNESS:  Leonard Stokes 78 y.o. male presents to establish care for prostate cancer I have reviewed his chart and materials related to his cancer extensively and collaborated history with the patient. Summary of oncologic history is as follows: Oncology History  Prostate cancer metastatic to multiple sites [facility])  06/03/2017 Imaging   CT abdomen pelvis showed 1. Development of bilateral pelvic sidewall adenopathy, consistent with nodal metastasis. 2.  Aortic Atherosclerosis (ICD10-I70.0). 3. Right-sided urinary bladder entering a right inguinal hernia, similar. 4. 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 At that time, biopsy showed Gleason 4+5 and 4+4 involving all cores on the right up to 96% of the prostate.  Staging indicated bilateral external iliac lymphadenopathy up to 3 cm on the left and 1.8 cm on the right.  Bone scan was negative.  Per neurology note, surgery option was discussed and patient declined.  Patient was started on adjuvant deprivation therapy, status post EBRT and he completed 2.5 years of ADT.  PSA had remained undetectable until 08/29/2023-PSA has increased to 66.    09/01/2023 Imaging   Patient went to emergency room for evaluation of constipation and abdominal/lower back pain.  As part of the workup, patient had a CT chest  abdomen pelvis done.  CT chest showed mildly enlarged bilateral hilar lymph node which are indeterminate.  CT abdomen pelvis  with contrast showed Significantly enlarged left periaortic adenopathy, most prominently seen in the left periaortic region, bilateral nonobstructive nephrolithiasis, sigmoid diverticulosis without inflammation   09/05/2023 Cancer Staging   Staging form: Prostate, AJCC 8th Edition - Clinical stage from 09/05/2023: Stage IVB (rcTX, rcN1, rcM1, PSA: 75) - Signed by Babara Call, MD on 09/06/2023 Stage prefix: Recurrence Prostate specific antigen (PSA) range: 20 or greater   09/06/2023 Imaging   PSMA PET scan showed   1. Widespread tracer-avid osseous metastatic disease without corresponding sclerotic bone lesions. New from the prior PET-CT. 2. Multiple tracer-avid thoracic, abdominal, and pelvic lymph nodes. New from prior PET-CT. 3. Heterogeneous hepatic uptake with multiple geographic areas of relative increased radiotracer uptake, indeterminate for infiltrative hepatocellular disease. No discrete tracer-avid lesion identified. No suspicious liver lesions on CT from 09/01/2023.   09/09/2023 Procedure   Left supraclavicular lymph node biopsy showed metastatic adenocarcinoma.  Additional IHC staining showed the adenocarcinoma is positive with NKX3.1 and prostein while negative with prostate-specific antigen (PSA).  The immunohistochemical findings are consistent with metastatic prostate adenocarcinoma     Patient presents to discuss pathology results and PET scan results.  Accompanied by wife. He reports feeling well at baseline.   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  He walks with a cane. Patient reports pain, right hip, bilateral rib cages.  Tylenol  lidocaine  patches did not help with the pain.  Patient has oxycodone  supply, he takes oxycodone  occasionally.  He has concerned about addiction.  Pain is worse when he changes position, 8 out of 10.    MEDICAL HISTORY:  Past Medical History:  Diagnosis Date   Anemia    Arthritis    Atherosclerosis of aorta (HCC) 2018   BPH (benign  prostatic hyperplasia)    Cancer (HCC) 2019   prostate   Chronic kidney disease 08/2016   kidney stone   Diverticulosis of sigmoid colon    History of kidney stones    Hypertension    Leg cramps     SURGICAL HISTORY: Past Surgical History:  Procedure Laterality Date   APPENDECTOMY     EXTRACORPOREAL SHOCK WAVE LITHOTRIPSY Right 08/19/2016   Procedure: EXTRACORPOREAL SHOCK WAVE LITHOTRIPSY (ESWL);  Surgeon: Penne Knee, MD;  Location: [facility] ORS;  Service: Urology;  Laterality: Right;   EXTRACORPOREAL SHOCK WAVE LITHOTRIPSY Left 03/24/2017   Procedure: EXTRACORPOREAL SHOCK WAVE LITHOTRIPSY (ESWL);  Surgeon: Penne Knee, MD;  Location: [facility] ORS;  Service: Urology;  Laterality: Left;   EYE MUSCLE SURGERY     INGUINAL HERNIA REPAIR Right 12/23/2017   Direct hernia, medium Ultra Pro mesh;  Surgeon: Dessa Reyes ORN, MD;  Location: [facility] ORS;  Service: General;  Laterality: Right;   JOINT REPLACEMENT     KNEE ARTHROPLASTY Left 11/15/2016   Procedure: COMPUTER ASSISTED TOTAL KNEE ARTHROPLASTY;  Surgeon: Mardee Lynwood SQUIBB, MD;  Location: [facility] ORS;  Service: Orthopedics;  Laterality: Left;   TOTAL KNEE ARTHROPLASTY Right     SOCIAL HISTORY: Social History   Socioeconomic History   Marital status: Married    Spouse name: Not on file   Number of children: Not on file   Years of education: Not on file   Highest education level: Not on file  Occupational History   Not on file  Tobacco Use   Smoking status: Former    Current packs/day: 0.00    Average packs/day: 3.0  packs/day for 20.0 years (60.0 ttl pk-yrs)    Types: Cigarettes    Start date: 05/31/1976    Quit date: 05/31/1996    Years since quitting: 27.3   Smokeless tobacco: Never  Vaping Use   Vaping status: Never Used  Substance and Sexual Activity   Alcohol use: Yes    Comment: 4-5 week   Drug use: No   Sexual activity: Yes  Other Topics Concern   Not on file  Social History Narrative   ** Merged History Encounter  **       Social Drivers of Health   Financial Resource Strain: Low Risk  (09/05/2023)   Overall Financial Resource Strain (CARDIA)    Difficulty of Paying Living Expenses: Not very hard  Food Insecurity: No Food Insecurity (09/05/2023)   Hunger Vital Sign    Worried About Running Out of Food in the Last Year: Never true    Ran Out of Food in the Last Year: Never true  Transportation Needs: No Transportation Needs (09/05/2023)   PRAPARE - Administrator, Civil Service (Medical): No    Lack of Transportation (Non-Medical): No  Physical Activity: Not on file  Stress: No Stress Concern Present (09/05/2023)   [employer] of Occupational Health - Occupational Stress Questionnaire    Feeling of Stress: Not at all  Social Connections: Not on file  Intimate Partner Violence: Not At Risk (09/05/2023)   Humiliation, Afraid, Rape, and Kick questionnaire    Fear of Current or Ex-Partner: No    Emotionally Abused: No    Physically Abused: No    Sexually Abused: No    FAMILY HISTORY: History reviewed. No pertinent family history.  ALLERGIES:  is allergic to codeine.  MEDICATIONS:  Current Outpatient Medications  Medication Sig Dispense Refill   acetaminophen  (TYLENOL ) 500 MG tablet Take 1,000 mg by mouth every 6 (six) hours as needed for moderate pain.      Ascorbic Acid  (VITAMIN C ) 1000 MG tablet Take 1,000 mg by mouth daily.     atorvastatin (LIPITOR) 20 MG tablet Take by mouth.     B Complex Vitamins (B COMPLEX-B12 PO) Take 1 tablet by mouth daily.     Calcium Acetate, Phos Binder, (CALCIUM ACETATE PO) Take 2 tablets by mouth daily.     Cholecalciferol  (VITAMIN D-3) 125 MCG (5000 UT) TABS Take 5,000 Units by mouth daily.     cyanocobalamin 1000 MCG tablet Take by mouth.     cyclobenzaprine  (FLEXERIL ) 10 MG tablet Take 1 tablet (10 mg total) by mouth 3 (three) times daily as needed for muscle spasms. 30 tablet 0   docusate sodium  (COLACE) 100 MG capsule Take 1 capsule (100 mg  total) by mouth 2 (two) times daily. 60 capsule 2   famotidine  (PEPCID ) 20 MG tablet Take 20 mg by mouth at bedtime.     ferrous sulfate  325 (65 FE) MG tablet Take 325 mg by mouth daily with breakfast.     finasteride  (PROSCAR ) 5 MG tablet TAKE ONE TABLET (5 MG) BY MOUTH EVERY OTHER DAY 30 tablet 11   hydrochlorothiazide  (HYDRODIURIL ) 12.5 MG tablet      ipratropium (ATROVENT) 0.03 % nasal spray SMARTSIG:1-2 Spray(s) Both Nares 3 Times Daily PRN     lidocaine  (LIDODERM ) 5 % Place 1 patch onto the skin daily. Remove & Discard patch within 12 hours or as directed by MD 30 patch 0   losartan -hydrochlorothiazide  (HYZAAR) 50-12.5 MG tablet Take 1 tablet by mouth every morning.  meloxicam (MOBIC) 15 MG tablet Take 1 tablet by mouth daily.     methocarbamol (ROBAXIN) 500 MG tablet Take 500 mg by mouth.     Misc Natural Products (OSTEO BI-FLEX TRIPLE STRENGTH PO) Take 1 tablet by mouth daily.      Multiple Vitamins-Minerals (SENIOR MULTIVITAMIN PLUS PO) Take 1 tablet by mouth daily.     NONFORMULARY OR COMPOUNDED ITEM Trimix (30/1/10)-(Pap/Phent/PGE)  Test Dose  1ml vial   Qty #3 Refills 0  [facility] [phone] Fax [phone] 3 each 0   polyethylene glycol (MIRALAX ) 17 g packet Take 17 g by mouth daily. 30 packet 0   tamsulosin  (FLOMAX ) 0.4 MG CAPS capsule TAKE ONE CAPSULE DAILY 30 MINUTES AFTER THE SAME MEAL EACH DAY 30 capsule 11   vitamin E 180 MG (400 UNITS) capsule Take by mouth.     zolpidem  (AMBIEN ) 10 MG tablet Take 10 mg by mouth at bedtime as needed for sleep.      oxyCODONE  (ROXICODONE ) 5 MG immediate release tablet Take 1 tablet (5 mg total) by mouth every 8 (eight) hours as needed for severe pain (pain score 7-10) or breakthrough pain. 30 tablet 0   No current facility-administered medications for this visit.    Review of Systems  Constitutional:  Positive for fatigue. Negative for appetite change, chills, fever and unexpected weight change.  HENT:   Negative  for hearing loss and voice change.   Eyes:  Negative for eye problems and icterus.  Respiratory:  Negative for chest tightness, cough and shortness of breath.   Cardiovascular:  Negative for chest pain and leg swelling.  Gastrointestinal:  Negative for abdominal distention and abdominal pain.  Endocrine: Negative for hot flashes.  Genitourinary:  Negative for difficulty urinating, dysuria and frequency.   Musculoskeletal:  Positive for arthralgias and gait problem.  Skin:  Negative for itching and rash.  Neurological:  Positive for gait problem. Negative for light-headedness and numbness.  Hematological:  Negative for adenopathy. Does not bruise/bleed easily.  Psychiatric/Behavioral:  Negative for confusion.      PHYSICAL EXAMINATION: ECOG PERFORMANCE STATUS: 1 - Symptomatic but completely ambulatory  Vitals:   09/15/23 1351  BP: 118/66  Pulse: (!) 102  Resp: 16  Temp: (!) 97.5 F (36.4 C)  SpO2: 100%   Filed Weights   09/15/23 1351  Weight: 160 lb (72.6 kg)    Physical Exam Constitutional:      General: He is not in acute distress.    Appearance: He is not diaphoretic.  HENT:     Head: Normocephalic and atraumatic.  Eyes:     General: No scleral icterus. Cardiovascular:     Rate and Rhythm: Normal rate.  Pulmonary:     Effort: Pulmonary effort is normal. No respiratory distress.     Breath sounds: Normal breath sounds.  Abdominal:     General: There is no distension.     Palpations: Abdomen is soft.     Tenderness: There is no abdominal tenderness.  Musculoskeletal:        General: Normal range of motion.     Cervical back: Normal range of motion and neck supple.  Skin:    General: Skin is warm and dry.     Findings: No erythema.  Neurological:     Mental Status: He is alert and oriented to person, place, and time. Mental status is at baseline.     Motor: No abnormal muscle tone.     Coordination: Coordination normal.  Psychiatric:  Mood and Affect:  Mood and affect normal.      LABORATORY DATA:  I have reviewed the data as listed    Latest Ref Rng & Units 09/01/2023    9:19 AM 10/15/2018    4:30 PM 12/15/2017   11:07 AM  CBC  WBC 4.0 - 10.5 K/uL 11.0  3.9    Hemoglobin 13.0 - 17.0 g/dL 88.2  88.4  87.3   Hematocrit 39.0 - 52.0 % 35.2  33.2    Platelets 150 - 400 K/uL 262  211        Latest Ref Rng & Units 09/01/2023    9:19 AM 10/15/2018    4:30 PM 12/15/2017   11:07 AM  CMP  Glucose 70 - 99 mg/dL 872  892    BUN 8 - 23 mg/dL 17  24    Creatinine 9.38 - 1.24 mg/dL 9.20  9.09    Sodium 864 - 145 mmol/L 135  138    Potassium 3.5 - 5.1 mmol/L 5.0  4.4  4.3   Chloride 98 - 111 mmol/L 100  105    CO2 22 - 32 mmol/L 23  24    Calcium 8.9 - 10.3 mg/dL 9.2  9.6    Total Protein 6.5 - 8.1 g/dL 7.1     Total Bilirubin 0.0 - 1.2 mg/dL 0.7     Alkaline Phos 38 - 126 U/L 199     AST 15 - 41 U/L 26     ALT 0 - 44 U/L 25        RADIOGRAPHIC STUDIES: I have personally reviewed the radiological images as listed and agreed with the findings in the report. US  CORE BIOPSY (LYMPH NODES) Result Date: 09/09/2023 INDICATION: 78 year old with prostate cancer. Recent PET-CT suggests widespread metastatic disease. Patient presents for ultrasound-guided lymph node biopsy. EXAM: ULTRASOUND-GUIDED CORE BIOPSY OF LEFT SUPRACLAVICULAR LYMPH NODE MEDICATIONS: 1% lidocaine  for local anesthetic ANESTHESIA/SEDATION: None FLUOROSCOPY TIME:  None COMPLICATIONS: None immediate. PROCEDURE: Informed written consent was obtained from the patient after a thorough discussion of the procedural risks, benefits and alternatives. All questions were addressed. A timeout was performed prior to the initiation of the procedure. Ultrasound was used to identify enlarged and abnormal left supraclavicular lymph nodes. A left supraclavicular lymph node was targeted for biopsy. The left lower neck was prepped with chlorhexidine  and sterile field was created. Skin was anesthetized  with 1% lidocaine . Small incision was made. Using ultrasound guidance, an 18 gauge core needle was directed into the lymph node and a total of 5 core biopsies were obtained. Specimens placed on a Telfa pad with saline. Bandage placed over the puncture site. Ultrasound images were taken and saved for this procedure. FINDINGS: Enlarged and abnormal left supraclavicular lymph nodes. Most accessible lymph node was biopsied. Five adequate specimens obtained. No immediate bleeding or hematoma formation. IMPRESSION: Ultrasound-guided core biopsy of a left supraclavicular lymph node. Electronically Signed   By: Juliene Balder M.D.   On: 09/09/2023 15:30   NM PET (PSMA) SKULL TO MID THIGH Addendum Date: 09/09/2023 ADDENDUM #1 EXAM: PROSTATE PET SKULL BASE TO MID THIGHS 09/06/2023 02:38:55 PM TECHNIQUE: PET imaging was obtained from skull vertex to mid thighs. Computed tomography was used for attenuation correction and localization. Fusion imaging was obtained. RADIOPHARMACEUTICAL: 8.65 mCi F-18 flotufolastaf (Posluma ) injected intravenously. COMPARISON: 06/24/2022 CLINICAL HISTORY: Prostate cancer, residual or recurrent disease suspected. No recent biopsy. Diagnosed 2019. Went thru chemo/rad therapy 2019. Lupron  injection. Current PSA 75.44. FINDINGS: PROSTATE AND PROSTATE  BED: Normal low-level 1F-FDG uptake within the prostate gland without focal area of hypermetabolism. SUV max within the prostate measures 3.0 cm, axial image 152. LYMPH NODES: Multiple tracer avid thoracic lymph nodes are identified. Index left supraclavicular node measures 7 mm with SUV max of 16.9, axial image 38. Index lower left paratracheal lymph node measures 6 mm with SUV max of 15.4. The index right hilar lymph node has an SUV max of 12.6. Multiple tracer-avid abdominal and pelvic lymph nodes are identified. Index left retroperitoneal lymph node measures 1.2 cm with SUV max of 19.6, axial image 97. Index retrocaval lymph node measures 1.24 cm with  SUV max of 20.23, axial image 103. Left posterior pelvic lymph node measures 0.8 cm with SUV max of 10.7. BONES: Widespread tracer-avid osseous metastatic disease identified without corresponding changes on the CT images. Index lesion within the right scapula has an SUV max of 19.1, axial image 46. Index lesion within the posterior aspect of the L4 vertebra has an SUV max of 30, axial image 116. Index lesion in the right iliac bone has an SUV max of 23.6, axial image 11/29. OTHER PET FINDINGS: There is heterogeneous uptake throughout both lobes of the liver with multiple geographic areas of relative increased radiotracer uptake. Significance of this is indeterminate. No discrete tracer avid lesion identified although underlying infiltrative hepatocellular disease cannot be excluded. On the CT from 09/01/2023, there were no suspicious liver lesions. No tracer avid pulmonary nodules. Aortic atherosclerosis and coronary artery calcifications. Penile prosthesis is identified. IMPRESSION: 1. Widespread tracer-avid osseous metastatic disease without corresponding sclerotic bone lesions. New from the prior PET-CT. 2. Multiple tracer-avid thoracic, abdominal, and pelvic lymph nodes. New from prior PET-CT. 3. Heterogeneous hepatic uptake with multiple geographic areas of relative increased radiotracer uptake, indeterminate for infiltrative hepatocellular disease. No discrete tracer-avid lesion identified. No suspicious liver lesions on CT from 09/01/2023. Electronically signed by: Waddell Calk MD 09/09/2023 08:42 AM EDT RP Workstation: [id number]   Result Date: 09/09/2023 ORIGINAL REPORT  EXAM: PROSTATE PET SKULL BASE TO MID THIGHS 09/06/2023 02:38:55 PM TECHNIQUE: PET imaging was obtained from skull vertex to mid thighs. Computed tomography was used for attenuation correction and localization. Fusion imaging was obtained. RADIOPHARMACEUTICAL: 8.65 mCi F-18 flotufolastaf (Posluma ) injected intravenously. COMPARISON:  06/24/2022 CLINICAL HISTORY: Prostate cancer, residual or recurrent disease suspected. No recent biopsy. Diagnosed 2019. Went thru chemo/rad therapy 2019. Lupron  injection. Current PSA 75.44. FINDINGS: PROSTATE AND PROSTATE BED: Normal low-level 1F-FDG uptake within the prostate gland without focal area of hypermetabolism. SUV max within the prostate measures 3.0 cm, axial image 152. LYMPH NODES: Multiple tracer rapid thoracic lymph nodes are identified. Index right supraclavicular node measures 7 mm with SUV max of 16.9, axial image 38. Index lower left paratracheal lymph node measures 6 mm with SUV max of 15.4. The index right hilar lymph node has an SUV max of 12.6. Multiple tracer-avid abdominal and pelvic lymph nodes are identified. Index left retroperitoneal lymph node measures 1.2 cm with SUV max of 19.6, axial image 97. Index retrocaval lymph node measures 1.24 cm with SUV max of 20.23, axial image 103. Left posterior pelvic lymph node measures 0.8 cm with SUV max of 10.7. BONES: Widespread tracer-avid osseous metastatic disease identified without corresponding changes on the CT images. Index lesion within the right scapula has an SUV max of 19.1, axial image 46. Index lesion within the posterior aspect of the L4 vertebra has an SUV max of 30, axial image 116. Index lesion in the right iliac  bone has an SUV max of 23.6, axial image 11/29. OTHER PET FINDINGS: There is heterogeneous uptake throughout both lobes of the liver with multiple geographic areas of relative increased radiotracer uptake. Significance of this is indeterminate. No discrete tracer avid lesion identified although underlying infiltrative hepatocellular disease cannot be excluded. On the CT from 09/01/2023, there were no suspicious liver lesions. No tracer avid pulmonary nodules. Aortic atherosclerosis and coronary artery calcifications. Penile prosthesis is identified. IMPRESSION: 1. Widespread tracer-avid osseous metastatic disease  without corresponding sclerotic bone lesions. New from the prior PET-CT. 2. Multiple tracer-avid thoracic, abdominal, and pelvic lymph nodes. New from prior PET-CT. 3. Heterogeneous hepatic uptake with multiple geographic areas of relative increased radiotracer uptake, indeterminate for infiltrative hepatocellular disease. No discrete tracer-avid lesion identified. No suspicious liver lesions on CT from 09/01/2023. Electronically signed by: Waddell Calk MD 09/06/2023 04:09 PM EDT RP Workstation: [id number]   CT Chest W Contrast Result Date: 09/01/2023 CLINICAL DATA:  left posterior rib pain. History of prostate carcinoma. * Tracking Code: BO * EXAM: CT CHEST WITH CONTRAST TECHNIQUE: Multidetector CT imaging of the chest was performed during intravenous contrast administration. RADIATION DOSE REDUCTION: This exam was performed according to the departmental dose-optimization program which includes automated exposure control, adjustment of the mA and/or kV according to patient size and/or use of iterative reconstruction technique. CONTRAST:  75mL OMNIPAQUE  IOHEXOL  300 MG/ML  SOLN COMPARISON:  CT scan chest from 10/02/2014 and PET-CT scan from 06/23/2017. FINDINGS: Cardiovascular: Normal cardiac size. No pericardial effusion. No aortic aneurysm. There are coronary artery calcifications, in keeping with coronary artery disease. There are also mild-to-moderate peripheral atherosclerotic vascular calcifications of thoracic aorta and its major branches. Mediastinum/Nodes: Visualized thyroid  gland appears grossly unremarkable. No solid / cystic mediastinal masses. The esophagus is nondistended precluding optimal assessment. There are few mildly enlarged bilateral hilar lymph nodes with largest in the right hilum measuring up to 10 x 17 mm. These are indeterminate in etiology. No axillary or mediastinal lymphadenopathy by size criteria. Lungs/Pleura: The central tracheo-bronchial tree is patent. There are patchy areas of  linear, plate-like atelectasis and/or scarring throughout bilateral lungs. No mass or consolidation. No pleural effusion or pneumothorax. No suspicious lung nodules. There are multiple sub 5 mm noncalcified opacities in the minor fissure (marked with electronic arrow sign on series 2005), favored to represent intra fissural lymph nodes. No suspicious lung nodules. Upper Abdomen: There is left para-aortic conglomerate nodal mass measuring up to 2.4 x 3.1 cm. There are additional retroperitoneal lymph nodes as well. Please refer to same-day acquired CT scan abdomen and pelvis report for additional details. Remaining visualized upper abdominal viscera within normal limits. Musculoskeletal: The visualized soft tissues of the chest wall are grossly unremarkable. No suspicious osseous lesions. There are mild multilevel degenerative changes in the visualized spine. IMPRESSION: 1. There are few mildly enlarged bilateral hilar lymph nodes, which are indeterminate in etiology. There are enlarged upper abdominal lymph nodes as well, better evaluated on the same-day acquired CT scan abdomen and pelvis. 2. Otherwise, no metastatic disease identified within the chest. 3. Multiple other nonacute observations, as described above. Aortic Atherosclerosis (ICD10-I70.0). Electronically Signed   By: Ree Molt M.D.   On: 09/01/2023 15:05   CT ABDOMEN PELVIS W CONTRAST Result Date: 09/01/2023 CLINICAL DATA:  Acute lower abdominal pain. History of prostate cancer. EXAM: CT ABDOMEN AND PELVIS WITH CONTRAST TECHNIQUE: Multidetector CT imaging of the abdomen and pelvis was performed using the standard protocol following bolus administration of intravenous contrast. RADIATION DOSE  REDUCTION: This exam was performed according to the departmental dose-optimization program which includes automated exposure control, adjustment of the mA and/or kV according to patient size and/or use of iterative reconstruction technique. CONTRAST:   OMNIPAQUE  IOHEXOL  300 MG/ML  SOLN COMPARISON:  Jun 03, 2017. FINDINGS: Lower chest: No acute abnormality. Hepatobiliary: Grossly stable low densities are noted in the hepatic parenchyma most consistent with cysts. No gallstones, gallbladder wall thickening, or biliary dilatation. Pancreas: Unremarkable. No pancreatic ductal dilatation or surrounding inflammatory changes. Spleen: Normal in size without focal abnormality. Adrenals/Urinary Tract: Adrenal glands are unremarkable. Bilateral nonobstructive nephrolithiasis. No hydronephrosis or renal obstruction is noted. Bladder is unremarkable. Stomach/Bowel: The stomach is unremarkable. Status post appendectomy. No evidence of bowel obstruction or inflammation. Sigmoid diverticulosis without inflammation. Moderate amount of stool seen throughout the colon. Vascular/Lymphatic: Aortic atherosclerosis. Significantly enlarged periaortic adenopathy is noted compared with prior exam. This is most prominently seen in the left periaortic region, with the largest conglomerate measuring 2.5 x 2.0 cm. This is consistent with metastatic disease. Reproductive: Prostate is unremarkable in size. Penile implant reservoir is noted anteriorly in left side of pelvis. Other: No ascites or hernia is noted. Musculoskeletal: Minimal grade 1 anterolisthesis is noted secondary to bilateral L5 spondylolysis. IMPRESSION: Significantly enlarged periaortic adenopathy is noted compared to prior exam, most prominently seen in the left periaortic region, consistent with worsening metastatic disease. Bilateral nonobstructive nephrolithiasis. Sigmoid diverticulosis without inflammation. Aortic Atherosclerosis (ICD10-I70.0). Electronically Signed   By: Lynwood Landy Raddle M.D.   On: 09/01/2023 12:22

## 2023-09-15 NOTE — Assessment & Plan Note (Signed)
 Recommend oxycodone  5 mg every 8 hours as needed pain.

## 2023-09-15 NOTE — Assessment & Plan Note (Signed)
 Loading dose Firmagon  today.  Rationale potential side effects were reviewed with patient

## 2023-09-16 ENCOUNTER — Telehealth: Payer: Self-pay | Admitting: Pharmacy Technician

## 2023-09-16 ENCOUNTER — Other Ambulatory Visit: Payer: Self-pay | Admitting: Pharmacy Technician

## 2023-09-16 ENCOUNTER — Telehealth: Payer: Self-pay | Admitting: *Deleted

## 2023-09-16 ENCOUNTER — Telehealth: Payer: Self-pay | Admitting: Pharmacist

## 2023-09-16 ENCOUNTER — Telehealth: Payer: Self-pay

## 2023-09-16 ENCOUNTER — Other Ambulatory Visit (HOSPITAL_COMMUNITY): Payer: Self-pay

## 2023-09-16 ENCOUNTER — Other Ambulatory Visit: Payer: Self-pay

## 2023-09-16 ENCOUNTER — Encounter: Payer: Self-pay | Admitting: Oncology

## 2023-09-16 DIAGNOSIS — C61 Malignant neoplasm of prostate: Secondary | ICD-10-CM

## 2023-09-16 MED ORDER — DAROLUTAMIDE 300 MG PO TABS
600.0000 mg | ORAL_TABLET | Freq: Two times a day (BID) | ORAL | 1 refills | Status: DC
  Filled 2023-09-16: qty 120, 30d supply, fill #0
  Filled 2023-10-07 (×2): qty 120, 30d supply, fill #1

## 2023-09-16 NOTE — Telephone Encounter (Signed)
 Clinical Pharmacist Practitioner Encounter   Patient Education I spoke with patient and his wife for overview of new oral chemotherapy medication: Nubeqa  (darolutamide ) for the treatment of newly diagnosed metastatic prostate cancer in conjunction with ADT, planned duration until disease progression or unacceptable drug toxicity.   Treatment goal: Palliative  Counseled patient on administration, dosing, side effects, monitoring, drug-food interactions, safe handling, storage, and disposal. Patient will take 2 tablets (600 mg total) by mouth 2 (two) times daily with a meal.   Side effects include but not limited to: fatigue, decrease wbc, liver function.    Reviewed with patient importance of keeping a medication schedule and plan for any missed doses.  After discussion with patient no patient barriers to medication adherence identified.   Communication and Learning Assessment Primary learner: patient and wife Barriers to learning: No barriers Preferred language: English Learning preferences: Listening Reading   [facility] voiced understanding and appreciation. All questions answered. Medication handout provided.  Provided patient with Oral Chemotherapy Navigation Clinic phone number. Patient knows to call the office with questions or concerns. Oral Chemotherapy Navigation Clinic will continue to follow.  Drago Hammonds N. Shareen Capwell, PharmD, BCOP, CPP Hematology/Oncology Clinical Pharmacist [facility] Oral Chemotherapy Navigation Clinic [phone]  09/16/2023 12:22 PM

## 2023-09-16 NOTE — Progress Notes (Signed)
 Patient education documented in EPIC note on 09/16/23.

## 2023-09-16 NOTE — Telephone Encounter (Signed)
 Oral Oncology Patient Advocate Encounter   Received notification that prior authorization for Nubeqa  is required.   PA submitted on CMM via Latent Key [id number] Status is pending     Leonard Stokes (Patty) Chet Burnet, CPhT  [facility], [place], [place] Oral Chemotherapy Patient Advocate Specialist III Phone: [phone]  Fax: [phone]

## 2023-09-16 NOTE — Progress Notes (Signed)
 Specialty Pharmacy Initial Fill Coordination Note  Leonard Stokes is a 78 y.o. male contacted today regarding refills of specialty medication(s) Darolutamide  (NUBEQA ) .  Patient requested Delivery  on 09/20/23  to verified address [address] [place] [address]   Medication will be filled on 09/19/2023.   Patient is aware of $0 copayment. Leisure centre manager.  Selen Smucker (Patty) Chet Burnet, CPhT  [facility], [place], [place] Oral Chemotherapy Patient Advocate Specialist III Phone: [phone]  Fax: [phone]

## 2023-09-16 NOTE — Telephone Encounter (Signed)
 The wife called in today stating that he ate good last night and he got up and got him a oxycodone  at 9:30pm and he also got methocarbamol.  About 2:30 in the morning he was vomiting. I asked if he has had oxycodone  or methocarbamol before and he wife says yes he has. He just wanted to have Dr. Babara to know that information and is there anything else that he needs. She says that today he is right back to his regular self.

## 2023-09-16 NOTE — Telephone Encounter (Addendum)
 Multidisciplinary Oncology Program Intake Form  Referral Receive Date:  09/09/2023  Rapid Access Appointment : No -  Patient not in scope for Rapid Access. Outside of timeframe.  Reason for Referral:  Patient Diagnosis: Prostate cancer Disease Reoccurrence: previous treatment completed, New Treatment Needed Disease Group: Urologic Specialty:  Medical Oncology  Referral Method:  Fax  Insurance Primary Insurance: Private/ACA/Employer    Authorization Obtained: NA  Record Collection:   Imaging Date Requested:  09/22/2023 Date Received:  09/22/2023 External Facility Name:  [facility] Phone / Fax:    Pt. Informed of possible charges associated with imaging? Yes ,  Pathology Date Requested: 09/21/2023 Date Received: 10/05/2023 External Facility Name:  [facility] Phone / Fax:    Pt. Informed of possible charges associated with pathology?   Close the Loop Communication: Referring Provider Contacted: No Patient Contacted: Yes  Welcome Packet  Date Sent: 09/16/2023 Sent: [employer]

## 2023-09-16 NOTE — Telephone Encounter (Signed)
 Clinical Pharmacist Practitioner Encounter   Received new prescription for Nubeqa  (darolutamide ) for the treatment of newly diagnosed metastatic prostate cancer in conjunction with ADT, planned duration until disease progression or unacceptable drug toxicity.  CMP from 09/01/23 assessed, no relevant lab abnormalities. Prescription dose and frequency assessed.   Current medication list in Epic reviewed, one DDIs with darolutamide  identified: Atorvastatin: Darolutamide  may increase serum concentrations of atorvastatin. Monitor patients for increased toxicities and effects of atorvastatin. No baseline dose adjustment needed.  Evaluated chart and no patient barriers to medication adherence identified.   Prescription has been e-scribed to the [facility] for benefits analysis and approval.  Oral Oncology Clinic will continue to follow for insurance authorization, copayment issues, initial counseling and start date.   Miyoko Hashimi N. Nephi Savage, PharmD, BCOP, CPP Hematology/Oncology Clinical Pharmacist [facility] Oral Chemotherapy Navigation Clinic [phone]  09/16/2023 9:00 AM

## 2023-09-16 NOTE — Telephone Encounter (Signed)
 Oral Oncology Patient Advocate Encounter  Was successful in securing patient a $6000 grant from [facility] to provide copayment coverage for Nubeqa .  This will keep the out of pocket expense at $0.     [employer] ID: [id number]   The billing information is as follows and has been shared with [facility].    RxBin: [id number] PCN: [id number] Member ID: [id number] Group ID: [id number] Dates of Eligibility: 08/17/2023 through 08/15/2024  Fund:  Prostate Cancer - Medicare Access  [place] (Patty) Chet Burnet, CPhT  [facility], [place], [place] Oral Chemotherapy Patient Advocate Specialist III Phone: [phone]  Fax: [phone]

## 2023-09-16 NOTE — Telephone Encounter (Signed)
 Patient successfully OnBoarded and drug education provided by pharmacist. Medication scheduled to be shipped on 08/18 for delivery on 08/19 from [facility] to patient's address. Patient also knows to call me at [phone] with any questions or concerns regarding receiving medication or if there is any unexpected change in co-pay.   Saanya Zieske (Patty) Chet Burnet, CPhT  [facility], [place], [facility] Oral Chemotherapy Patient Advocate Specialist III Phone: [phone]  Fax: [phone]

## 2023-09-16 NOTE — Telephone Encounter (Signed)
-----   Message from Barbette Chet Burnet sent at 09/16/2023 12:28 PM EDT ----- Patient is set to have Nubeqa  (Darolutamide ) delivered next Tuesday, 09/20/2023, in the afternoon. ----- Message ----- From: Babara Call, MD Sent: 09/15/2023  10:01 PM EDT To: Leonard JINNY Nett, RN; Fonda KANDICE Sax, CMA#  Hi  I plan to start him on ARPI.  Darolutamide > enzalutamide> Abiateron. Would you please check insurance approval. I plan to see him 2 to 3 weeks after starting on treatments.- lab MD cbc cmp PSA  Please also arrange patient to have firmagon  injection 4 weeks from today.  Thank you

## 2023-09-16 NOTE — Telephone Encounter (Signed)
 Oral Oncology Patient Advocate Encounter  Prior Authorization for Nubeqa  has been approved.    PA# [id number]  Effective dates: 09/16/2023 through 09/15/2024  Patients co-pay is $1,509.64.    Leonard Stokes (Patty) Chet Burnet, CPhT  [facility], [place], [facility] Oral Chemotherapy Patient Advocate Specialist III Phone: [phone]  Fax: [phone]

## 2023-09-16 NOTE — Telephone Encounter (Signed)
 Pt will receive medication next Tuesady  09/20/23. Please schedule and contact pt with appt detals:   2 - 3 weeks after 8/19: lab/MD (cbc,cmp, psa)

## 2023-09-16 NOTE — Telephone Encounter (Signed)
 Copied from CRM [id number]. Topic: New Scheduling - Other New Intake >> Sep 16, 2023 11:26 AM Harlene NOVAK wrote: Leonard, Stokes 04/25/1945 [phone]  We just received a call regarding this patient. They are requesting a new appointment for a Genitourinary/GU prostate cancer diagnosis. A warm transfer was placed to a new intake scheduler.  Referral: Yes  Thank you,  Harlene Stager [facility] [phone]

## 2023-09-19 ENCOUNTER — Telehealth: Payer: Self-pay | Admitting: *Deleted

## 2023-09-19 ENCOUNTER — Other Ambulatory Visit: Payer: Self-pay

## 2023-09-19 NOTE — Telephone Encounter (Signed)
 Wife called today about a paper that she had last week and she asked about ending the paper for them because they will not be able to get on the cruise I think it is and they needed it to be canceled because of his treatment.  I called her today and I told her that Leonard Stokes did send it last week and it went through and she is going to do it again, to make sure that everything goes okay.  Wife was happy with.

## 2023-09-19 NOTE — Telephone Encounter (Signed)
 The wife called today and says that she would like to have someone set up a walker with wheels and seat.

## 2023-09-20 ENCOUNTER — Encounter: Payer: Self-pay | Admitting: Oncology

## 2023-09-20 ENCOUNTER — Telehealth: Payer: Self-pay | Admitting: *Deleted

## 2023-09-20 NOTE — Telephone Encounter (Signed)
 Ms. Dinovo called today to see when they are going to get the rolling walker and when I called back there was no answer so I left a message it is going to be delivered to them by and [employer] and it usually takes up to 3 to 7 days to get it done.

## 2023-09-23 ENCOUNTER — Telehealth: Payer: Self-pay | Admitting: *Deleted

## 2023-09-23 ENCOUNTER — Emergency Department

## 2023-09-23 ENCOUNTER — Other Ambulatory Visit: Payer: Self-pay

## 2023-09-23 ENCOUNTER — Emergency Department
Admission: EM | Admit: 2023-09-23 | Discharge: 2023-09-23 | Discharge status: Against Medical Advice | Attending: Emergency Medicine | Admitting: Emergency Medicine

## 2023-09-23 DIAGNOSIS — R932 Abnormal findings on diagnostic imaging of liver and biliary tract: Secondary | ICD-10-CM | POA: Diagnosis not present

## 2023-09-23 DIAGNOSIS — R0789 Other chest pain: Secondary | ICD-10-CM | POA: Insufficient documentation

## 2023-09-23 DIAGNOSIS — C61 Malignant neoplasm of prostate: Secondary | ICD-10-CM | POA: Diagnosis not present

## 2023-09-23 DIAGNOSIS — Z8546 Personal history of malignant neoplasm of prostate: Secondary | ICD-10-CM | POA: Insufficient documentation

## 2023-09-23 DIAGNOSIS — C778 Secondary and unspecified malignant neoplasm of lymph nodes of multiple regions: Secondary | ICD-10-CM | POA: Diagnosis not present

## 2023-09-23 DIAGNOSIS — C7951 Secondary malignant neoplasm of bone: Secondary | ICD-10-CM | POA: Diagnosis not present

## 2023-09-23 DIAGNOSIS — J449 Chronic obstructive pulmonary disease, unspecified: Secondary | ICD-10-CM | POA: Diagnosis not present

## 2023-09-23 DIAGNOSIS — Z85830 Personal history of malignant neoplasm of bone: Secondary | ICD-10-CM | POA: Insufficient documentation

## 2023-09-23 DIAGNOSIS — Z5321 Procedure and treatment not carried out due to patient leaving prior to being seen by health care provider: Secondary | ICD-10-CM | POA: Diagnosis not present

## 2023-09-23 DIAGNOSIS — R079 Chest pain, unspecified: Secondary | ICD-10-CM | POA: Diagnosis not present

## 2023-09-23 LAB — CBC
HCT: 31.2 % — ABNORMAL LOW (ref 39.0–52.0)
Hemoglobin: 10.3 g/dL — ABNORMAL LOW (ref 13.0–17.0)
MCH: 29.3 pg (ref 26.0–34.0)
MCHC: 33 g/dL (ref 30.0–36.0)
MCV: 88.9 fL (ref 80.0–100.0)
Platelets: 351 K/uL (ref 150–400)
RBC: 3.51 MIL/uL — ABNORMAL LOW (ref 4.22–5.81)
RDW: 16.2 % — ABNORMAL HIGH (ref 11.5–15.5)
WBC: 9.4 K/uL (ref 4.0–10.5)
nRBC: 0 % (ref 0.0–0.2)

## 2023-09-23 LAB — BASIC METABOLIC PANEL WITH GFR
Anion gap: 5 (ref 5–15)
BUN: 18 mg/dL (ref 8–23)
CO2: 32 mmol/L (ref 22–32)
Calcium: 8.7 mg/dL — ABNORMAL LOW (ref 8.9–10.3)
Chloride: 97 mmol/L — ABNORMAL LOW (ref 98–111)
Creatinine, Ser: 0.94 mg/dL (ref 0.61–1.24)
GFR, Estimated: >60 mL/min (ref >60)
Glucose, Bld: 169 mg/dL — ABNORMAL HIGH (ref 70–99)
Potassium: 4.8 mmol/L (ref 3.5–5.1)
Sodium: 134 mmol/L — ABNORMAL LOW (ref 135–145)

## 2023-09-23 LAB — TROPONIN I (HIGH SENSITIVITY): Troponin I (High Sensitivity): 8 ng/L (ref <18)

## 2023-09-23 NOTE — Telephone Encounter (Signed)
 I told the patient and wife that I spoke with Dr. Babara and she says because this is a new thing with the pain in the chest that he should be going to the ER.  The wife said that she gave him oxycodone  and the pain is gone.  I told her that I would go ask Dr. Babara again what to do.  I am a doctor side we still do not know what is causing the chest pain so therefore she still says he needs to go to the ER.  When I called back to tell this information no one answered so I left it on the voicemail.

## 2023-09-23 NOTE — ED Triage Notes (Signed)
 Pt to ED via POV from home. Pt ambulatory to triage with cane. Pt reports left sided CP that has been intermittent since this am. Pt had oxycodone  PTA and helped with the pain some. 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

## 2023-09-23 NOTE — ED Notes (Signed)
 Patient and wife to First Nurse Desk stating they wanted to leave. Pt encouraged to stay but declined. NAD noted. Ambulatory out of ED with cane.

## 2023-09-26 DIAGNOSIS — C61 Malignant neoplasm of prostate: Secondary | ICD-10-CM | POA: Diagnosis not present

## 2023-09-26 DIAGNOSIS — R2681 Unsteadiness on feet: Secondary | ICD-10-CM | POA: Diagnosis not present

## 2023-09-26 DIAGNOSIS — M6281 Muscle weakness (generalized): Secondary | ICD-10-CM | POA: Diagnosis not present

## 2023-09-26 DIAGNOSIS — R262 Difficulty in walking, not elsewhere classified: Secondary | ICD-10-CM | POA: Diagnosis not present

## 2023-09-27 ENCOUNTER — Inpatient Hospital Stay

## 2023-09-27 ENCOUNTER — Inpatient Hospital Stay (HOSPITAL_BASED_OUTPATIENT_CLINIC_OR_DEPARTMENT_OTHER): Admitting: Hospice and Palliative Medicine

## 2023-09-27 VITALS — BP 124/83 | HR 93 | Temp 97.3°F | Wt 158.0 lb

## 2023-09-27 DIAGNOSIS — C61 Malignant neoplasm of prostate: Secondary | ICD-10-CM | POA: Diagnosis not present

## 2023-09-27 DIAGNOSIS — Z515 Encounter for palliative care: Secondary | ICD-10-CM | POA: Diagnosis not present

## 2023-09-27 MED ORDER — OXYCODONE HCL 5 MG PO TABS: 5.0000 mg | ORAL_TABLET | Freq: Three times a day (TID) | ORAL | 0 refills | Status: DC | PRN

## 2023-09-27 NOTE — Progress Notes (Signed)
 Palliative Medicine [facility] [phone] [phone]   Name: Leonard Stokes Date: 09/27/2023 MRN: [id number]  DOB: [birth year]  Patient Care Team: Diedra Lame, MD as PCP - General (Family Medicine) Babara Call, MD as Consulting Physician (Oncology)    REASON FOR CONSULTATION: Leonard Stokes is a 78 y.o. 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  Patient referred to palliative care to address goals of manage ongoing symptoms.  SOCIAL HISTORY:     reports that he quit smoking about 27 years ago. His smoking use included cigarettes. He started smoking about 47 years ago. He has a 60 pack-year smoking history. He has never used smokeless tobacco. He reports current alcohol use. He reports that he does not use drugs.  Patient is married lives at home with his wife of 56 years.  They have a son and daughter who live nearby.  They have another son who lives in [place] .  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  ADVANCE DIRECTIVES:  Not on file  CODE STATUS:   PAST MEDICAL HISTORY: Past Medical History:  Diagnosis Date   Anemia    Arthritis    Atherosclerosis of aorta (HCC) 2018   BPH (benign prostatic hyperplasia)    Cancer (HCC) 2019   prostate   Chronic kidney disease 08/2016   kidney stone   Diverticulosis of sigmoid colon    History of kidney stones    Hypertension    Leg cramps     PAST SURGICAL HISTORY:  Past Surgical History:  Procedure Laterality Date   APPENDECTOMY     EXTRACORPOREAL SHOCK WAVE LITHOTRIPSY Right 08/19/2016   Procedure: EXTRACORPOREAL SHOCK WAVE LITHOTRIPSY (ESWL);  Surgeon: Penne Knee, MD;  Location: [facility] ORS;  Service: Urology;  Laterality: Right;   EXTRACORPOREAL SHOCK WAVE LITHOTRIPSY Left 03/24/2017   Procedure: EXTRACORPOREAL SHOCK WAVE LITHOTRIPSY (ESWL);  Surgeon: Penne Knee, MD;  Location: [facility] ORS;  Service: Urology;  Laterality: Left;   EYE MUSCLE SURGERY     INGUINAL HERNIA REPAIR Right 12/23/2017   Direct hernia, medium Ultra Pro mesh;  Surgeon: Dessa Reyes ORN, MD;  Location: [facility] ORS;  Service: General;  Laterality: Right;   JOINT REPLACEMENT     KNEE ARTHROPLASTY Left 11/15/2016   Procedure: COMPUTER ASSISTED TOTAL KNEE ARTHROPLASTY;  Surgeon: Mardee Lynwood SQUIBB, MD;  Location: [facility] ORS;  Service: Orthopedics;  Laterality: Left;   TOTAL KNEE ARTHROPLASTY Right     HEMATOLOGY/ONCOLOGY HISTORY:  Oncology History  Prostate cancer metastatic to multiple sites [facility])  06/03/2017 Imaging   CT abdomen pelvis showed 1. Development of bilateral pelvic sidewall adenopathy, consistent with nodal metastasis. 2.  Aortic Atherosclerosis (ICD10-I70.0). 3. Right-sided urinary bladder entering a right inguinal hernia, similar. 4. 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 At that time, biopsy showed Gleason 4+5 and 4+4 involving all cores on the right up to 96% of the prostate.  Staging indicated bilateral external iliac lymphadenopathy up to 3 cm on the left and 1.8 cm on the right.  Bone scan was negative.  Per neurology note, surgery option was discussed and patient declined.  Patient was started on adjuvant deprivation therapy, status post EBRT and he completed 2.5 years of ADT.  PSA had remained undetectable until 08/29/2023-PSA has  increased to 66.    09/01/2023 Imaging   Patient went to emergency room for evaluation of constipation and abdominal/lower back pain.  As part of the workup, patient had a CT chest abdomen pelvis done.  CT chest showed mildly enlarged bilateral hilar lymph node which are indeterminate.  CT abdomen pelvis with contrast showed Significantly enlarged left periaortic adenopathy, most  prominently seen in the left periaortic region, bilateral nonobstructive nephrolithiasis, sigmoid diverticulosis without inflammation   09/05/2023 Cancer Staging   Staging form: Prostate, AJCC 8th Edition - Clinical stage from 09/05/2023: Stage IVB (rcTX, rcN1, rcM1, PSA: 75) - Signed by Babara Call, MD on 09/06/2023 Stage prefix: Recurrence Prostate specific antigen (PSA) range: 20 or greater   09/06/2023 Imaging   PSMA PET scan showed   1. Widespread tracer-avid osseous metastatic disease without corresponding sclerotic bone lesions. New from the prior PET-CT. 2. Multiple tracer-avid thoracic, abdominal, and pelvic lymph nodes. New from prior PET-CT. 3. Heterogeneous hepatic uptake with multiple geographic areas of relative increased radiotracer uptake, indeterminate for infiltrative hepatocellular disease. No discrete tracer-avid lesion identified. No suspicious liver lesions on CT from 09/01/2023.   09/09/2023 Procedure   Left supraclavicular lymph node biopsy showed metastatic adenocarcinoma.  Additional IHC staining showed the adenocarcinoma is positive with NKX3.1 and prostein while negative with prostate-specific antigen (PSA).  The immunohistochemical findings are consistent with metastatic prostate adenocarcinoma      ALLERGIES:  is allergic to codeine.  MEDICATIONS:  Current Outpatient Medications  Medication Sig Dispense Refill   acetaminophen  (TYLENOL ) 500 MG tablet Take 1,000 mg by mouth every 6 (six) hours as needed for moderate pain.      Ascorbic Acid  (VITAMIN C ) 1000 MG tablet Take 1,000 mg by mouth daily.     atorvastatin (LIPITOR) 20 MG tablet Take by mouth.     B Complex Vitamins (B COMPLEX-B12 PO) Take 1 tablet by mouth daily.     Calcium Acetate, Phos Binder, (CALCIUM ACETATE PO) Take 2 tablets by mouth daily.     Cholecalciferol  (VITAMIN D-3) 125 MCG (5000 UT) TABS Take 5,000 Units by mouth daily.     cyanocobalamin 1000 MCG tablet Take by mouth.     cyclobenzaprine   (FLEXERIL ) 10 MG tablet Take 1 tablet (10 mg total) by mouth 3 (three) times daily as needed for muscle spasms. 30 tablet 0   darolutamide  (NUBEQA ) 300 MG tablet Take 2 tablets (600 mg total) by mouth 2 (two) times daily with a meal. 120 tablet 1   docusate sodium  (COLACE) 100 MG capsule Take 1 capsule (100 mg total) by mouth 2 (two) times daily. 60 capsule 2   famotidine  (PEPCID ) 20 MG tablet Take 20 mg by mouth at bedtime.     ferrous sulfate  325 (65 FE) MG tablet Take 325 mg by mouth daily with breakfast.     finasteride  (PROSCAR ) 5 MG tablet TAKE ONE TABLET (5 MG) BY MOUTH EVERY OTHER DAY 30 tablet 11   hydrochlorothiazide  (HYDRODIURIL ) 12.5 MG tablet      ipratropium (ATROVENT) 0.03 % nasal spray SMARTSIG:1-2 Spray(s) Both Nares 3 Times Daily PRN     lidocaine  (LIDODERM ) 5 % Place 1 patch onto the skin daily. Remove & Discard patch within 12 hours or as directed by MD 30 patch 0   losartan -hydrochlorothiazide  (HYZAAR) 50-12.5 MG tablet Take 1 tablet by mouth every morning.      meloxicam (MOBIC) 15 MG tablet Take 1 tablet by mouth daily.     methocarbamol (ROBAXIN) 500 MG tablet  Take 500 mg by mouth.     Misc Natural Products (OSTEO BI-FLEX TRIPLE STRENGTH PO) Take 1 tablet by mouth daily.      Multiple Vitamins-Minerals (SENIOR MULTIVITAMIN PLUS PO) Take 1 tablet by mouth daily.     NONFORMULARY OR COMPOUNDED ITEM Trimix (30/1/10)-(Pap/Phent/PGE)  Test Dose  1ml vial   Qty #3 Refills 0  [facility] [phone] Fax [phone] 3 each 0   oxyCODONE  (ROXICODONE ) 5 MG immediate release tablet Take 1 tablet (5 mg total) by mouth every 8 (eight) hours as needed for severe pain (pain score 7-10) or breakthrough pain. 30 tablet 0   polyethylene glycol (MIRALAX ) 17 g packet Take 17 g by mouth daily. 30 packet 0   tamsulosin  (FLOMAX ) 0.4 MG CAPS capsule TAKE ONE CAPSULE DAILY 30 MINUTES AFTER THE SAME MEAL EACH DAY 30 capsule 11   vitamin E 180 MG (400 UNITS) capsule Take by mouth.      zolpidem  (AMBIEN ) 10 MG tablet Take 10 mg by mouth at bedtime as needed for sleep.      No current facility-administered medications for this visit.    VITAL SIGNS: BP 124/83 (BP Location: Left Arm, Patient Position: Sitting, Cuff Size: Normal)   Pulse 93   Temp (!) 97.3 F (36.3 C) (Tympanic)   Wt 158 lb (71.7 kg)   SpO2 100%   BMI 22.04 kg/m  Filed Weights   09/27/23 1050  Weight: 158 lb (71.7 kg)    Estimated body mass index is 22.04 kg/m as calculated from the following:   Height as of 09/01/23: 5' 11 (1.803 m).   Weight as of this encounter: 158 lb (71.7 kg).  LABS: CBC:    Component Value Date/Time   WBC 9.4 09/23/2023 1615   HGB 10.3 (L) 09/23/2023 1615   HGB 10.5 (L) 12/10/2011 0514   HCT 31.2 (L) 09/23/2023 1615   HCT 42.0 11/25/2011 1104   PLT 351 09/23/2023 1615   PLT 173 12/10/2011 0514   MCV 88.9 09/23/2023 1615   MCV 94 11/25/2011 1104   Comprehensive Metabolic Panel:    Component Value Date/Time   NA 134 (L) 09/23/2023 1615   NA 141 12/10/2011 0514   K 4.8 09/23/2023 1615   K 4.1 12/10/2011 0514   CL 97 (L) 09/23/2023 1615   CL 108 (H) 12/10/2011 0514   CO2 32 09/23/2023 1615   CO2 24 12/10/2011 0514   BUN 18 09/23/2023 1615   BUN 13 12/10/2011 0514   CREATININE 0.94 09/23/2023 1615   CREATININE 0.78 12/10/2011 0514   GLUCOSE 169 (H) 09/23/2023 1615   GLUCOSE 95 12/10/2011 0514   CALCIUM 8.7 (L) 09/23/2023 1615   CALCIUM 7.8 (L) 12/10/2011 0514   AST 26 09/01/2023 0919   ALT 25 09/01/2023 0919   ALKPHOS 199 (H) 09/01/2023 0919   BILITOT 0.7 09/01/2023 0919   PROT 7.1 09/01/2023 0919   ALBUMIN 3.4 (L) 09/01/2023 0919    RADIOGRAPHIC STUDIES:   PERFORMANCE STATUS (ECOG) : 1 - Symptomatic but completely ambulatory  Review of Systems Unless otherwise noted, a complete review of systems is negative.  Physical Exam General: NAD Cardiovascular: regular rate and rhythm Pulmonary: clear ant fields Abdomen: soft, nontender, + bowel  sounds GU: no suprapubic tenderness Extremities: no edema, no joint deformities Skin: no rashes Neurological: Weakness but otherwise nonfocal  IMPRESSION: I met with patient and wife.  I introduced palliative care.  Patient tearful as he described the emotional impact of his cancer diagnosis.  However, he denies depression or anxiety.  Patient is interested in referral to [facility] to assist with mental health coping.  Will also involve social work.  At baseline, patient lives at home with his wife.  He is independent with his own care but has been recently weak and ambulating with use of a cane.  He describes some balance instability.  Patient has worked with physical therapy in the past with good improvement.  Will refer to [facility] for rehab screening.  Oral intake is poor and patient has had some weight loss.  He is drinking nutritional supplements.  Referral to nutrition.  Patient's goals are aligned with current scope of treatment.  He is optimistic and hopes for improvement.  He understands that his cancer is incurable.  Patient does have advance directives but he is meeting with his attorney to complete new documents.  I did send him home with a MOST form to review with family.  Patient denies pain at present but states that he has intermittently required use of oxycodone , which works well for controlling his symptoms.  He request refill of oxycodone  today.  Discussed importance of a daily bowel regimen to prevent opioid-induced constipation.  PLAN: - Continue current scope of treatment - Referrals to [facility], social work, rehab screening, and nutrition - Refill oxycodone  - Daily bowel regimen - Follow-up telephone visit 1 month   Patient expressed understanding and was in agreement with this plan. He also understands that He can call the clinic at any time with any questions, concerns, or complaints.     Time Total: 45 minutes  Visit consisted of counseling and  education dealing with the complex and emotionally intense issues of symptom management and palliative care in the setting of serious and potentially life-threatening illness.Greater than 50%  of this time was spent counseling and coordinating care related to the above assessment and plan.  Signed by: Fonda Mower, PhD, NP-C

## 2023-09-27 NOTE — Progress Notes (Signed)
 [facility] Clinical Social Work  Initial Assessment   Leonard Stokes is a 78 y.o. year old male contacted by phone. Clinical Social Work was referred by medical provider for assessment of psychosocial needs.   SDOH (Social Determinants of Health) assessments performed: Yes SDOH Interventions    Flowsheet Row Office Visit from 09/05/2023 in [facility] - A Dept Of [facility]  SDOH Interventions   Food Insecurity Interventions Intervention Not Indicated  Housing Interventions Intervention Not Indicated  Transportation Interventions Intervention Not Indicated  Utilities Interventions Intervention Not Indicated  Financial Strain Interventions Intervention Not Indicated  Stress Interventions Intervention Not Indicated  Health Literacy Interventions Intervention Not Indicated    SDOH Screenings   Food Insecurity: No Food Insecurity (09/20/2023)   Received from [facility]  Housing: Low Risk  (09/05/2023)  Transportation Needs: No Transportation Needs (09/20/2023)   Received from [facility]  Utilities: Low Risk  (09/20/2023)   Received from [facility]  Depression [phone]): Low Risk  (09/27/2023)  Financial Resource Strain: Low Risk  (09/20/2023)   Received from [facility]  Stress: No Stress Concern Present (09/05/2023)  Tobacco Use: Medium Risk (09/23/2023)  Health Literacy: Low Risk  (09/20/2023)   Received from [facility]    PHQ 2/9:    09/27/2023   10:57 AM 09/15/2023    1:49 PM 09/05/2023    3:26 PM  Depression screen PHQ 2/9  Decreased Interest 0 0 0  Down, Depressed, Hopeless 0 0 0  PHQ - 2 Score 0 0 0     Distress Screen completed: No    06/16/2017   11:05 AM  ONCBCN DISTRESS SCREENING  Screening Type Initial Screening  How much distress have you been experiencing in the past week? (0-10) 0      Data saved with a previous flowsheet row definition      Family/Social Information:  Housing Arrangement: patient lives with  his wife, Leonard Stokes. Family members/support persons in your life? Family.  Patient has a son and daughter near by and a son nine hours away.   Transportation concerns: no  Employment: Retired  Income source: Actor concerns: No Type of concern: None Food access concerns: no Religious or spiritual practice: Yes-Patient expressed his strong faith in God. Advanced directives: Currently addressing. Services Currently in place:  Medicare  Coping/ Adjustment to diagnosis: Patient understands treatment plan and what happens next? yes Concerns about diagnosis and/or treatment: Patient expressed feeling overwhelmed with cancer progression. Patient reported stressors: Adjusting to my illness Hopes and/or priorities: God Patient enjoys time with family/ friends Current coping skills/ strengths: Average or above average intelligence , Capable of independent living , Communication skills , Contractor , General fund of knowledge , Motivation for treatment/growth , Religious Affiliation , and Supportive family/friends     SUMMARY: Current SDOH Barriers:  None per patient.  Clinical Social Work Clinical Goal(s):  Patient will work with SW to address concerns related to adjusting to his illness.  Interventions: Discussed common feeling and emotions when being diagnosed with cancer, and the importance of support during treatment Informed patient of the support team roles and support services at [facility] Provided CSW contact information and encouraged patient to call with any questions or concerns Provided patient with information about CSW role.  Engineer, drilling and CSW contact information.    Follow Up Plan: CSW will follow-up with patient by phone  Patient verbalizes understanding  of plan: Yes    Leonard CHRISTELLA Au, LCSW Clinical Social Worker [facility]

## 2023-09-29 DIAGNOSIS — C7951 Secondary malignant neoplasm of bone: Secondary | ICD-10-CM | POA: Diagnosis not present

## 2023-09-29 DIAGNOSIS — C61 Malignant neoplasm of prostate: Secondary | ICD-10-CM | POA: Diagnosis not present

## 2023-09-29 DIAGNOSIS — C778 Secondary and unspecified malignant neoplasm of lymph nodes of multiple regions: Secondary | ICD-10-CM | POA: Diagnosis not present

## 2023-09-30 ENCOUNTER — Other Ambulatory Visit: Payer: Self-pay | Admitting: Oncology

## 2023-09-30 DIAGNOSIS — C61 Malignant neoplasm of prostate: Secondary | ICD-10-CM

## 2023-09-30 DIAGNOSIS — C7951 Secondary malignant neoplasm of bone: Secondary | ICD-10-CM

## 2023-10-05 ENCOUNTER — Encounter: Payer: Self-pay | Admitting: Oncology

## 2023-10-05 ENCOUNTER — Inpatient Hospital Stay (HOSPITAL_BASED_OUTPATIENT_CLINIC_OR_DEPARTMENT_OTHER): Admitting: Oncology

## 2023-10-05 ENCOUNTER — Inpatient Hospital Stay: Attending: Oncology

## 2023-10-05 VITALS — BP 111/73 | HR 85 | Temp 97.6°F | Resp 18 | Wt 159.8 lb

## 2023-10-05 DIAGNOSIS — N200 Calculus of kidney: Secondary | ICD-10-CM | POA: Diagnosis not present

## 2023-10-05 DIAGNOSIS — C61 Malignant neoplasm of prostate: Secondary | ICD-10-CM

## 2023-10-05 DIAGNOSIS — I2510 Atherosclerotic heart disease of native coronary artery without angina pectoris: Secondary | ICD-10-CM | POA: Insufficient documentation

## 2023-10-05 DIAGNOSIS — R2681 Unsteadiness on feet: Secondary | ICD-10-CM | POA: Insufficient documentation

## 2023-10-05 DIAGNOSIS — R531 Weakness: Secondary | ICD-10-CM | POA: Diagnosis not present

## 2023-10-05 DIAGNOSIS — C7951 Secondary malignant neoplasm of bone: Secondary | ICD-10-CM | POA: Diagnosis not present

## 2023-10-05 DIAGNOSIS — G893 Neoplasm related pain (acute) (chronic): Secondary | ICD-10-CM | POA: Insufficient documentation

## 2023-10-05 DIAGNOSIS — K4090 Unilateral inguinal hernia, without obstruction or gangrene, not specified as recurrent: Secondary | ICD-10-CM | POA: Diagnosis not present

## 2023-10-05 DIAGNOSIS — M1990 Unspecified osteoarthritis, unspecified site: Secondary | ICD-10-CM | POA: Diagnosis not present

## 2023-10-05 DIAGNOSIS — Z9049 Acquired absence of other specified parts of digestive tract: Secondary | ICD-10-CM | POA: Diagnosis not present

## 2023-10-05 DIAGNOSIS — F39 Unspecified mood [affective] disorder: Secondary | ICD-10-CM | POA: Insufficient documentation

## 2023-10-05 DIAGNOSIS — K5900 Constipation, unspecified: Secondary | ICD-10-CM | POA: Insufficient documentation

## 2023-10-05 DIAGNOSIS — C778 Secondary and unspecified malignant neoplasm of lymph nodes of multiple regions: Secondary | ICD-10-CM | POA: Diagnosis not present

## 2023-10-05 DIAGNOSIS — R4586 Emotional lability: Secondary | ICD-10-CM | POA: Insufficient documentation

## 2023-10-05 DIAGNOSIS — N400 Enlarged prostate without lower urinary tract symptoms: Secondary | ICD-10-CM | POA: Insufficient documentation

## 2023-10-05 DIAGNOSIS — K5730 Diverticulosis of large intestine without perforation or abscess without bleeding: Secondary | ICD-10-CM | POA: Insufficient documentation

## 2023-10-05 DIAGNOSIS — G629 Polyneuropathy, unspecified: Secondary | ICD-10-CM | POA: Insufficient documentation

## 2023-10-05 DIAGNOSIS — Z87442 Personal history of urinary calculi: Secondary | ICD-10-CM | POA: Diagnosis not present

## 2023-10-05 DIAGNOSIS — Z79899 Other long term (current) drug therapy: Secondary | ICD-10-CM | POA: Insufficient documentation

## 2023-10-05 DIAGNOSIS — Z87891 Personal history of nicotine dependence: Secondary | ICD-10-CM | POA: Insufficient documentation

## 2023-10-05 DIAGNOSIS — Z885 Allergy status to narcotic agent status: Secondary | ICD-10-CM | POA: Diagnosis not present

## 2023-10-05 DIAGNOSIS — Z79818 Long term (current) use of other agents affecting estrogen receptors and estrogen levels: Secondary | ICD-10-CM

## 2023-10-05 DIAGNOSIS — M4306 Spondylolysis, lumbar region: Secondary | ICD-10-CM | POA: Insufficient documentation

## 2023-10-05 DIAGNOSIS — R2689 Other abnormalities of gait and mobility: Secondary | ICD-10-CM | POA: Insufficient documentation

## 2023-10-05 DIAGNOSIS — C775 Secondary and unspecified malignant neoplasm of intrapelvic lymph nodes: Secondary | ICD-10-CM | POA: Diagnosis present

## 2023-10-05 DIAGNOSIS — I700 Atherosclerosis of aorta: Secondary | ICD-10-CM | POA: Insufficient documentation

## 2023-10-05 LAB — CMP (CANCER CENTER ONLY)
ALT: 26 U/L (ref 0–44)
AST: 26 U/L (ref 15–41)
Albumin: 3.3 g/dL — ABNORMAL LOW (ref 3.5–5.0)
Alkaline Phosphatase: 266 U/L — ABNORMAL HIGH (ref 38–126)
Anion gap: 8 (ref 5–15)
BUN: 18 mg/dL (ref 8–23)
CO2: 25 mmol/L (ref 22–32)
Calcium: 9 mg/dL (ref 8.9–10.3)
Chloride: 103 mmol/L (ref 98–111)
Creatinine: 0.75 mg/dL (ref 0.61–1.24)
GFR, Estimated: >60 mL/min (ref >60)
Glucose, Bld: 118 mg/dL — ABNORMAL HIGH (ref 70–99)
Potassium: 4.4 mmol/L (ref 3.5–5.1)
Sodium: 136 mmol/L (ref 135–145)
Total Bilirubin: 0.6 mg/dL (ref 0.0–1.2)
Total Protein: 6.6 g/dL (ref 6.5–8.1)

## 2023-10-05 LAB — CBC WITH DIFFERENTIAL (CANCER CENTER ONLY)
Abs Immature Granulocytes: 0.36 K/uL — ABNORMAL HIGH (ref 0.00–0.07)
Basophils Absolute: 0.1 K/uL (ref 0.0–0.1)
Basophils Relative: 1 %
Eosinophils Absolute: 0.1 K/uL (ref 0.0–0.5)
Eosinophils Relative: 1 %
HCT: 31.9 % — ABNORMAL LOW (ref 39.0–52.0)
Hemoglobin: 10.3 g/dL — ABNORMAL LOW (ref 13.0–17.0)
Immature Granulocytes: 5 %
Lymphocytes Relative: 10 %
Lymphs Abs: 0.8 K/uL (ref 0.7–4.0)
MCH: 29.3 pg (ref 26.0–34.0)
MCHC: 32.3 g/dL (ref 30.0–36.0)
MCV: 90.6 fL (ref 80.0–100.0)
Monocytes Absolute: 2.5 K/uL — ABNORMAL HIGH (ref 0.1–1.0)
Monocytes Relative: 31 %
Neutro Abs: 4.3 K/uL (ref 1.7–7.7)
Neutrophils Relative %: 52 %
Platelet Count: 214 K/uL (ref 150–400)
RBC: 3.52 MIL/uL — ABNORMAL LOW (ref 4.22–5.81)
RDW: 17.1 % — ABNORMAL HIGH (ref 11.5–15.5)
WBC Count: 8.1 K/uL (ref 4.0–10.5)
nRBC: 0 % (ref 0.0–0.2)

## 2023-10-05 LAB — PSA: Prostatic Specific Antigen: 5.16 ng/mL — ABNORMAL HIGH (ref 0.00–4.00)

## 2023-10-05 NOTE — Assessment & Plan Note (Addendum)
 09/09/2023 PSMA PET showed wildly metastatic bone and lymphadenopathy, consistent with recurrent prostate cancer, castration sensitive, metastatic.  Heterogeneous hypermetabolic area in liver, no discrete liver lesions.  Indeterminate. Right supraclavicular lymph node biopsy confirmed metastatic adenocarcinoma consistent with prostate origin. On androgen deprivation therapy with Firmagon  Labs are reviewed and discussed with patient. He tolerates Darolutamide  600mg  BID.  Continue current regimen.   Referred to palliative care service.

## 2023-10-05 NOTE — Assessment & Plan Note (Signed)
 Refer to PT

## 2023-10-05 NOTE — Progress Notes (Signed)
 Hematology/Oncology Progress note [phone] [phone]        REFERRING PROVIDER: Diedra Lame, MD    CHIEF COMPLAINTS/PURPOSE OF CONSULTATION:  Castration sensitive metastatic prostate cancer  ASSESSMENT & PLAN:   Cancer Staging  Prostate cancer metastatic to multiple sites [facility]) Staging form: Prostate, AJCC 8th Edition - Clinical stage from 09/05/2023: Stage IVB (rcTX, rcN1, rcM1, PSA: 75) - Signed by Babara Call, MD on 09/06/2023   Prostate cancer metastatic to multiple sites [facility]) 09/09/2023 PSMA PET showed wildly metastatic bone and lymphadenopathy, consistent with recurrent prostate cancer, castration sensitive, metastatic.  Heterogeneous hypermetabolic area in liver, no discrete liver lesions.  Indeterminate. Right supraclavicular lymph node biopsy confirmed metastatic adenocarcinoma consistent with prostate origin. On androgen deprivation therapy with Firmagon  Labs are reviewed and discussed with patient. He tolerates Darolutamide  600mg  BID.  Continue current regimen.   Referred to palliative care service.  Encounter for monitoring androgen deprivation therapy Firmagon  monthly, plan to switch to long acting ADT.   Neoplasm related pain Recommend oxycodone  5 mg every 8 hours as needed pain.  Mood swing Monitor. ? Mood disorder associated with cancer, vs mood changes due to hormone change.  He was referred to [facility] care.    Gait instability Refer to PT    Orders Placed This Encounter  Procedures   CMP (Cancer Center only)    Standing Status:   Future    Expected Date:   11/02/2023    Expiration Date:   01/31/2024   CBC with Differential (Cancer Center Only)    Standing Status:   Future    Expected Date:   11/02/2023    Expiration Date:   01/31/2024   PSA    Standing Status:   Future    Expected Date:   11/02/2023    Expiration Date:   01/31/2024   Ambulatory referral to Physical Therapy    Referral Priority:   Routine    Referral  Type:   Physical Medicine    Referral Reason:   Specialty Services Required    Requested Specialty:   Physical Therapy    Number of Visits Requested:   1   We spent sufficient time to discuss many aspect of care, questions were answered to patient's satisfaction.  All questions were answered. The patient knows to call the clinic with any problems, questions or concerns.  Call Babara, MD, PhD [facility] Hematology Oncology 10/05/2023    HISTORY OF PRESENTING ILLNESS:  Leonard Stokes 78 y.o. male presents to establish care for prostate cancer I have reviewed his chart and materials related to his cancer extensively and collaborated history with the patient. Summary of oncologic history is as follows: Oncology History  Prostate cancer metastatic to multiple sites [facility])  06/03/2017 Imaging   CT abdomen pelvis showed 1. Development of bilateral pelvic sidewall adenopathy, consistent with nodal metastasis. 2.  Aortic Atherosclerosis (ICD10-I70.0). 3. Right-sided urinary bladder entering a right inguinal hernia, similar. 4. 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 At that time, biopsy showed Gleason 4+5 and 4+4 involving all cores on the right up to 96% of the prostate.  Staging indicated bilateral external iliac lymphadenopathy up to 3 cm on the left and 1.8 cm on the right.  Bone scan was negative.  Per neurology note, surgery option was discussed and patient declined.  Patient was started on adjuvant deprivation therapy,  status post EBRT and he completed 2.5 years of ADT.  PSA had remained undetectable until 08/29/2023-PSA has increased to 66.    09/01/2023 Imaging   Patient went to emergency room for evaluation of constipation and abdominal/lower back pain.  As part of the workup, patient had a CT chest abdomen pelvis done.  CT chest showed mildly  enlarged bilateral hilar lymph node which are indeterminate.  CT abdomen pelvis with contrast showed Significantly enlarged left periaortic adenopathy, most prominently seen in the left periaortic region, bilateral nonobstructive nephrolithiasis, sigmoid diverticulosis without inflammation   09/05/2023 Cancer Staging   Staging form: Prostate, AJCC 8th Edition - Clinical stage from 09/05/2023: Stage IVB (rcTX, rcN1, rcM1, PSA: 75) - Signed by Babara Call, MD on 09/06/2023 Stage prefix: Recurrence Prostate specific antigen (PSA) range: 20 or greater   09/06/2023 Imaging   PSMA PET scan showed   1. Widespread tracer-avid osseous metastatic disease without corresponding sclerotic bone lesions. New from the prior PET-CT. 2. Multiple tracer-avid thoracic, abdominal, and pelvic lymph nodes. New from prior PET-CT. 3. Heterogeneous hepatic uptake with multiple geographic areas of relative increased radiotracer uptake, indeterminate for infiltrative hepatocellular disease. No discrete tracer-avid lesion identified. No suspicious liver lesions on CT from 09/01/2023.   09/09/2023 Procedure   Left supraclavicular lymph node biopsy showed metastatic adenocarcinoma.  Additional IHC staining showed the adenocarcinoma is positive with NKX3.1 and prostein while negative with prostate-specific antigen (PSA).  The immunohistochemical findings are consistent with metastatic prostate adenocarcinoma      09/20/23 started on Darolutamide  600mg  BID. Tolerated well.  He reports feeling well at baseline.   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  He walks with a cane. Pain is better controlled. He reports feeling overwhelmed with multiple phone calls from supportive care teams.  + mood swing episodes.     MEDICAL HISTORY:  Past Medical History:  Diagnosis Date   Anemia    Arthritis    Atherosclerosis of aorta (HCC) 2018   BPH (benign prostatic hyperplasia)    Cancer (HCC) 2019   prostate   Chronic  kidney disease 08/2016   kidney stone   Diverticulosis of sigmoid colon    History of kidney stones    Hypertension    Leg cramps     SURGICAL HISTORY: Past Surgical History:  Procedure Laterality Date   APPENDECTOMY     EXTRACORPOREAL SHOCK WAVE LITHOTRIPSY Right 08/19/2016   Procedure: EXTRACORPOREAL SHOCK WAVE LITHOTRIPSY (ESWL);  Surgeon: Penne Knee, MD;  Location: [facility] ORS;  Service: Urology;  Laterality: Right;   EXTRACORPOREAL SHOCK WAVE LITHOTRIPSY Left 03/24/2017   Procedure: EXTRACORPOREAL SHOCK WAVE LITHOTRIPSY (ESWL);  Surgeon: Penne Knee, MD;  Location: [facility] ORS;  Service: Urology;  Laterality: Left;   EYE MUSCLE SURGERY     INGUINAL HERNIA REPAIR Right 12/23/2017   Direct hernia, medium Ultra Pro mesh;  Surgeon: Dessa Reyes ORN, MD;  Location: [facility] ORS;  Service: General;  Laterality: Right;   JOINT REPLACEMENT     KNEE ARTHROPLASTY Left 11/15/2016   Procedure: COMPUTER ASSISTED TOTAL KNEE ARTHROPLASTY;  Surgeon: Mardee Lynwood SQUIBB, MD;  Location: [facility] ORS;  Service: Orthopedics;  Laterality: Left;   TOTAL KNEE ARTHROPLASTY Right     SOCIAL HISTORY: Social History   Socioeconomic History   Marital status: Married    Spouse name: Not on file   Number of children: Not on file   Years of education: Not on file   Highest education level: Not on file  Occupational History  Not on file  Tobacco Use   Smoking status: Former    Current packs/day: 0.00    Average packs/day: 3.0 packs/day for 20.0 years (60.0 ttl pk-yrs)    Types: Cigarettes    Start date: 05/31/1976    Quit date: 05/31/1996    Years since quitting: 27.3   Smokeless tobacco: Never  Vaping Use   Vaping status: Never Used  Substance and Sexual Activity   Alcohol use: Yes    Comment: 4-5 week   Drug use: No   Sexual activity: Yes  Other Topics Concern   Not on file  Social History Narrative   ** Merged History Encounter **       Social Drivers of Health   Financial Resource Strain:  Low Risk  (09/20/2023)   Received from [facility]   Overall Financial Resource Strain (CARDIA)    How hard is it for you to pay for the very basics like food, housing, medical care, and heating?: Not hard at all  Food Insecurity: No Food Insecurity (09/20/2023)   Received from [facility]   Hunger Vital Sign    Within the past 12 months, you worried that your food would run out before you got the money to buy more.: Never true    Within the past 12 months, the food you bought just didn't last and you didn't have money to get more.: Never true  Transportation Needs: No Transportation Needs (09/20/2023)   Received from [facility]   PRAPARE - Transportation    Lack of Transportation (Medical): No    Lack of Transportation (Non-Medical): No  Physical Activity: Not on file  Stress: No Stress Concern Present (09/05/2023)   [employer] of Occupational Health - Occupational Stress Questionnaire    Feeling of Stress: Not at all  Social Connections: Not on file  Intimate Partner Violence: Not At Risk (09/05/2023)   Humiliation, Afraid, Rape, and Kick questionnaire    Fear of Current or Ex-Partner: No    Emotionally Abused: No    Physically Abused: No    Sexually Abused: No    FAMILY HISTORY: History reviewed. No pertinent family history.  ALLERGIES:  is allergic to codeine.  MEDICATIONS:  Current Outpatient Medications  Medication Sig Dispense Refill   acetaminophen  (TYLENOL ) 500 MG tablet Take 1,000 mg by mouth every 6 (six) hours as needed for moderate pain.      Ascorbic Acid  (VITAMIN C ) 1000 MG tablet Take 1,000 mg by mouth daily.     atorvastatin (LIPITOR) 20 MG tablet Take by mouth.     B Complex Vitamins (B COMPLEX-B12 PO) Take 1 tablet by mouth daily.     Calcium Acetate, Phos Binder, (CALCIUM ACETATE PO) Take 2 tablets by mouth daily.     Cholecalciferol  (VITAMIN D-3) 125 MCG (5000 UT) TABS Take 5,000 Units by mouth daily.     cyanocobalamin 1000 MCG tablet Take  by mouth.     cyclobenzaprine  (FLEXERIL ) 10 MG tablet Take 1 tablet (10 mg total) by mouth 3 (three) times daily as needed for muscle spasms. 30 tablet 0   darolutamide  (NUBEQA ) 300 MG tablet Take 2 tablets (600 mg total) by mouth 2 (two) times daily with a meal. 120 tablet 1   docusate sodium  (COLACE) 100 MG capsule Take 1 capsule (100 mg total) by mouth 2 (two) times daily. 60 capsule 2   famotidine  (PEPCID ) 20 MG tablet Take 20 mg by mouth at bedtime.     ferrous  sulfate 325 (65 FE) MG tablet Take 325 mg by mouth daily with breakfast.     finasteride  (PROSCAR ) 5 MG tablet TAKE ONE TABLET (5 MG) BY MOUTH EVERY OTHER DAY 30 tablet 11   gabapentin  (NEURONTIN ) 300 MG capsule Take 300 mg by mouth at bedtime.     ipratropium (ATROVENT) 0.03 % nasal spray SMARTSIG:1-2 Spray(s) Both Nares 3 Times Daily PRN     losartan -hydrochlorothiazide  (HYZAAR) 50-12.5 MG tablet Take 1 tablet by mouth every morning.      meloxicam (MOBIC) 15 MG tablet Take 1 tablet by mouth daily.     Misc Natural Products (OSTEO BI-FLEX TRIPLE STRENGTH PO) Take 1 tablet by mouth daily.      Multiple Vitamins-Minerals (SENIOR MULTIVITAMIN PLUS PO) Take 1 tablet by mouth daily.     oxyCODONE  (ROXICODONE ) 5 MG immediate release tablet Take 1 tablet (5 mg total) by mouth every 8 (eight) hours as needed for severe pain (pain score 7-10) or breakthrough pain. 30 tablet 0   polyethylene glycol (MIRALAX ) 17 g packet Take 17 g by mouth daily. 30 packet 0   tamsulosin  (FLOMAX ) 0.4 MG CAPS capsule TAKE ONE CAPSULE DAILY 30 MINUTES AFTER THE SAME MEAL EACH DAY 30 capsule 11   vitamin E 180 MG (400 UNITS) capsule Take by mouth.     zolpidem  (AMBIEN ) 10 MG tablet Take 10 mg by mouth at bedtime as needed for sleep.      hydrochlorothiazide  (HYDRODIURIL ) 12.5 MG tablet  (Patient not taking: Reported on 10/05/2023)     methocarbamol (ROBAXIN) 500 MG tablet Take 500 mg by mouth. (Patient not taking: Reported on 10/05/2023)     NONFORMULARY OR  COMPOUNDED ITEM Trimix (30/1/10)-(Pap/Phent/PGE)  Test Dose  1ml vial   Qty #3 Refills 0  [facility] [phone] Fax [phone] (Patient not taking: Reported on 09/27/2023) 3 each 0   No current facility-administered medications for this visit.    Review of Systems  Constitutional:  Positive for fatigue. Negative for appetite change, chills, fever and unexpected weight change.  HENT:   Negative for hearing loss and voice change.   Eyes:  Negative for eye problems and icterus.  Respiratory:  Negative for chest tightness, cough and shortness of breath.   Cardiovascular:  Negative for chest pain and leg swelling.  Gastrointestinal:  Negative for abdominal distention and abdominal pain.  Endocrine: Negative for hot flashes.  Genitourinary:  Negative for difficulty urinating, dysuria and frequency.   Musculoskeletal:  Positive for arthralgias and gait problem.  Skin:  Negative for itching and rash.  Neurological:  Positive for gait problem. Negative for light-headedness and numbness.  Hematological:  Negative for adenopathy. Does not bruise/bleed easily.  Psychiatric/Behavioral:  Negative for confusion.      PHYSICAL EXAMINATION: ECOG PERFORMANCE STATUS: 1 - Symptomatic but completely ambulatory  Vitals:   10/05/23 0840  BP: 111/73  Pulse: 85  Resp: 18  Temp: 97.6 F (36.4 C)  SpO2: 100%   Filed Weights   10/05/23 0840  Weight: 159 lb 12.8 oz (72.5 kg)    Physical Exam Constitutional:      General: He is not in acute distress.    Appearance: He is not diaphoretic.  HENT:     Head: Normocephalic and atraumatic.  Eyes:     General: No scleral icterus. Cardiovascular:     Rate and Rhythm: Normal rate.  Pulmonary:     Effort: Pulmonary effort is normal. No respiratory distress.     Breath sounds: Normal  breath sounds.  Abdominal:     General: There is no distension.     Palpations: Abdomen is soft.     Tenderness: There is no abdominal tenderness.   Musculoskeletal:        General: Normal range of motion.     Cervical back: Normal range of motion and neck supple.  Skin:    General: Skin is warm and dry.     Findings: No erythema.  Neurological:     Mental Status: He is alert and oriented to person, place, and time. Mental status is at baseline.     Motor: No abnormal muscle tone.     Coordination: Coordination normal.  Psychiatric:        Mood and Affect: Mood and affect normal.      LABORATORY DATA:  I have reviewed the data as listed    Latest Ref Rng & Units 10/05/2023    8:20 AM 09/23/2023    4:15 PM 09/01/2023    9:19 AM  CBC  WBC 4.0 - 10.5 K/uL 8.1  9.4  11.0   Hemoglobin 13.0 - 17.0 g/dL 89.6  89.6  88.2   Hematocrit 39.0 - 52.0 % 31.9  31.2  35.2   Platelets 150 - 400 K/uL 214  351  262       Latest Ref Rng & Units 10/05/2023    8:20 AM 09/23/2023    4:15 PM 09/01/2023    9:19 AM  CMP  Glucose 70 - 99 mg/dL 881  830  872   BUN 8 - 23 mg/dL 18  18  17    Creatinine 0.61 - 1.24 mg/dL 9.24  9.05  9.20   Sodium 135 - 145 mmol/L 136  134  135   Potassium 3.5 - 5.1 mmol/L 4.4  4.8  5.0   Chloride 98 - 111 mmol/L 103  97  100   CO2 22 - 32 mmol/L 25  32  23   Calcium 8.9 - 10.3 mg/dL 9.0  8.7  9.2   Total Protein 6.5 - 8.1 g/dL 6.6   7.1   Total Bilirubin 0.0 - 1.2 mg/dL 0.6   0.7   Alkaline Phos 38 - 126 U/L 266   199   AST 15 - 41 U/L 26   26   ALT 0 - 44 U/L 26   25      RADIOGRAPHIC STUDIES: I have personally reviewed the radiological images as listed and agreed with the findings in the report. US  CORE BIOPSY (LYMPH NODES) Result Date: 09/09/2023 INDICATION: 78 year old with prostate cancer. Recent PET-CT suggests widespread metastatic disease. Patient presents for ultrasound-guided lymph node biopsy. EXAM: ULTRASOUND-GUIDED CORE BIOPSY OF LEFT SUPRACLAVICULAR LYMPH NODE MEDICATIONS: 1% lidocaine  for local anesthetic ANESTHESIA/SEDATION: None FLUOROSCOPY TIME:  None COMPLICATIONS: None immediate. PROCEDURE:  Informed written consent was obtained from the patient after a thorough discussion of the procedural risks, benefits and alternatives. All questions were addressed. A timeout was performed prior to the initiation of the procedure. Ultrasound was used to identify enlarged and abnormal left supraclavicular lymph nodes. A left supraclavicular lymph node was targeted for biopsy. The left lower neck was prepped with chlorhexidine  and sterile field was created. Skin was anesthetized with 1% lidocaine . Small incision was made. Using ultrasound guidance, an 18 gauge core needle was directed into the lymph node and a total of 5 core biopsies were obtained. Specimens placed on a Telfa pad with saline. Bandage placed over the puncture site. Ultrasound images were  taken and saved for this procedure. FINDINGS: Enlarged and abnormal left supraclavicular lymph nodes. Most accessible lymph node was biopsied. Five adequate specimens obtained. No immediate bleeding or hematoma formation. IMPRESSION: Ultrasound-guided core biopsy of a left supraclavicular lymph node. Electronically Signed   By: Juliene Balder M.D.   On: 09/09/2023 15:30   NM PET (PSMA) SKULL TO MID THIGH Addendum Date: 09/09/2023 ADDENDUM #1 EXAM: PROSTATE PET SKULL BASE TO MID THIGHS 09/06/2023 02:38:55 PM TECHNIQUE: PET imaging was obtained from skull vertex to mid thighs. Computed tomography was used for attenuation correction and localization. Fusion imaging was obtained. RADIOPHARMACEUTICAL: 8.65 mCi F-18 flotufolastaf (Posluma ) injected intravenously. COMPARISON: 06/24/2022 CLINICAL HISTORY: Prostate cancer, residual or recurrent disease suspected. No recent biopsy. Diagnosed 2019. Went thru chemo/rad therapy 2019. Lupron  injection. Current PSA 75.44. FINDINGS: PROSTATE AND PROSTATE BED: Normal low-level 36F-FDG uptake within the prostate gland without focal area of hypermetabolism. SUV max within the prostate measures 3.0 cm, axial image 152. LYMPH NODES:  Multiple tracer avid thoracic lymph nodes are identified. Index left supraclavicular node measures 7 mm with SUV max of 16.9, axial image 38. Index lower left paratracheal lymph node measures 6 mm with SUV max of 15.4. The index right hilar lymph node has an SUV max of 12.6. Multiple tracer-avid abdominal and pelvic lymph nodes are identified. Index left retroperitoneal lymph node measures 1.2 cm with SUV max of 19.6, axial image 97. Index retrocaval lymph node measures 1.24 cm with SUV max of 20.23, axial image 103. Left posterior pelvic lymph node measures 0.8 cm with SUV max of 10.7. BONES: Widespread tracer-avid osseous metastatic disease identified without corresponding changes on the CT images. Index lesion within the right scapula has an SUV max of 19.1, axial image 46. Index lesion within the posterior aspect of the L4 vertebra has an SUV max of 30, axial image 116. Index lesion in the right iliac bone has an SUV max of 23.6, axial image 11/29. OTHER PET FINDINGS: There is heterogeneous uptake throughout both lobes of the liver with multiple geographic areas of relative increased radiotracer uptake. Significance of this is indeterminate. No discrete tracer avid lesion identified although underlying infiltrative hepatocellular disease cannot be excluded. On the CT from 09/01/2023, there were no suspicious liver lesions. No tracer avid pulmonary nodules. Aortic atherosclerosis and coronary artery calcifications. Penile prosthesis is identified. IMPRESSION: 1. Widespread tracer-avid osseous metastatic disease without corresponding sclerotic bone lesions. New from the prior PET-CT. 2. Multiple tracer-avid thoracic, abdominal, and pelvic lymph nodes. New from prior PET-CT. 3. Heterogeneous hepatic uptake with multiple geographic areas of relative increased radiotracer uptake, indeterminate for infiltrative hepatocellular disease. No discrete tracer-avid lesion identified. No suspicious liver lesions on CT from  09/01/2023. Electronically signed by: Waddell Calk MD 09/09/2023 08:42 AM EDT RP Workstation: [id number]   Result Date: 09/09/2023 ORIGINAL REPORT  EXAM: PROSTATE PET SKULL BASE TO MID THIGHS 09/06/2023 02:38:55 PM TECHNIQUE: PET imaging was obtained from skull vertex to mid thighs. Computed tomography was used for attenuation correction and localization. Fusion imaging was obtained. RADIOPHARMACEUTICAL: 8.65 mCi F-18 flotufolastaf (Posluma ) injected intravenously. COMPARISON: 06/24/2022 CLINICAL HISTORY: Prostate cancer, residual or recurrent disease suspected. No recent biopsy. Diagnosed 2019. Went thru chemo/rad therapy 2019. Lupron  injection. Current PSA 75.44. FINDINGS: PROSTATE AND PROSTATE BED: Normal low-level 36F-FDG uptake within the prostate gland without focal area of hypermetabolism. SUV max within the prostate measures 3.0 cm, axial image 152. LYMPH NODES: Multiple tracer rapid thoracic lymph nodes are identified. Index right supraclavicular node measures 7 mm  with SUV max of 16.9, axial image 38. Index lower left paratracheal lymph node measures 6 mm with SUV max of 15.4. The index right hilar lymph node has an SUV max of 12.6. Multiple tracer-avid abdominal and pelvic lymph nodes are identified. Index left retroperitoneal lymph node measures 1.2 cm with SUV max of 19.6, axial image 97. Index retrocaval lymph node measures 1.24 cm with SUV max of 20.23, axial image 103. Left posterior pelvic lymph node measures 0.8 cm with SUV max of 10.7. BONES: Widespread tracer-avid osseous metastatic disease identified without corresponding changes on the CT images. Index lesion within the right scapula has an SUV max of 19.1, axial image 46. Index lesion within the posterior aspect of the L4 vertebra has an SUV max of 30, axial image 116. Index lesion in the right iliac bone has an SUV max of 23.6, axial image 11/29. OTHER PET FINDINGS: There is heterogeneous uptake throughout both lobes of the liver with  multiple geographic areas of relative increased radiotracer uptake. Significance of this is indeterminate. No discrete tracer avid lesion identified although underlying infiltrative hepatocellular disease cannot be excluded. On the CT from 09/01/2023, there were no suspicious liver lesions. No tracer avid pulmonary nodules. Aortic atherosclerosis and coronary artery calcifications. Penile prosthesis is identified. IMPRESSION: 1. Widespread tracer-avid osseous metastatic disease without corresponding sclerotic bone lesions. New from the prior PET-CT. 2. Multiple tracer-avid thoracic, abdominal, and pelvic lymph nodes. New from prior PET-CT. 3. Heterogeneous hepatic uptake with multiple geographic areas of relative increased radiotracer uptake, indeterminate for infiltrative hepatocellular disease. No discrete tracer-avid lesion identified. No suspicious liver lesions on CT from 09/01/2023. Electronically signed by: Waddell Calk MD 09/06/2023 04:09 PM EDT RP Workstation: [id number]   CT Chest W Contrast Result Date: 09/01/2023 CLINICAL DATA:  left posterior rib pain. History of prostate carcinoma. * Tracking Code: BO * EXAM: CT CHEST WITH CONTRAST TECHNIQUE: Multidetector CT imaging of the chest was performed during intravenous contrast administration. RADIATION DOSE REDUCTION: This exam was performed according to the departmental dose-optimization program which includes automated exposure control, adjustment of the mA and/or kV according to patient size and/or use of iterative reconstruction technique. CONTRAST:  75mL OMNIPAQUE  IOHEXOL  300 MG/ML  SOLN COMPARISON:  CT scan chest from 10/02/2014 and PET-CT scan from 06/23/2017. FINDINGS: Cardiovascular: Normal cardiac size. No pericardial effusion. No aortic aneurysm. There are coronary artery calcifications, in keeping with coronary artery disease. There are also mild-to-moderate peripheral atherosclerotic vascular calcifications of thoracic aorta and its major  branches. Mediastinum/Nodes: Visualized thyroid  gland appears grossly unremarkable. No solid / cystic mediastinal masses. The esophagus is nondistended precluding optimal assessment. There are few mildly enlarged bilateral hilar lymph nodes with largest in the right hilum measuring up to 10 x 17 mm. These are indeterminate in etiology. No axillary or mediastinal lymphadenopathy by size criteria. Lungs/Pleura: The central tracheo-bronchial tree is patent. There are patchy areas of linear, plate-like atelectasis and/or scarring throughout bilateral lungs. No mass or consolidation. No pleural effusion or pneumothorax. No suspicious lung nodules. There are multiple sub 5 mm noncalcified opacities in the minor fissure (marked with electronic arrow sign on series 2005), favored to represent intra fissural lymph nodes. No suspicious lung nodules. Upper Abdomen: There is left para-aortic conglomerate nodal mass measuring up to 2.4 x 3.1 cm. There are additional retroperitoneal lymph nodes as well. Please refer to same-day acquired CT scan abdomen and pelvis report for additional details. Remaining visualized upper abdominal viscera within normal limits. Musculoskeletal: The visualized soft tissues of  the chest wall are grossly unremarkable. No suspicious osseous lesions. There are mild multilevel degenerative changes in the visualized spine. IMPRESSION: 1. There are few mildly enlarged bilateral hilar lymph nodes, which are indeterminate in etiology. There are enlarged upper abdominal lymph nodes as well, better evaluated on the same-day acquired CT scan abdomen and pelvis. 2. Otherwise, no metastatic disease identified within the chest. 3. Multiple other nonacute observations, as described above. Aortic Atherosclerosis (ICD10-I70.0). Electronically Signed   By: Ree Molt M.D.   On: 09/01/2023 15:05   CT ABDOMEN PELVIS W CONTRAST Result Date: 09/01/2023 CLINICAL DATA:  Acute lower abdominal pain. History of prostate  cancer. EXAM: CT ABDOMEN AND PELVIS WITH CONTRAST TECHNIQUE: Multidetector CT imaging of the abdomen and pelvis was performed using the standard protocol following bolus administration of intravenous contrast. RADIATION DOSE REDUCTION: This exam was performed according to the departmental dose-optimization program which includes automated exposure control, adjustment of the mA and/or kV according to patient size and/or use of iterative reconstruction technique. CONTRAST:  OMNIPAQUE  IOHEXOL  300 MG/ML  SOLN COMPARISON:  Jun 03, 2017. FINDINGS: Lower chest: No acute abnormality. Hepatobiliary: Grossly stable low densities are noted in the hepatic parenchyma most consistent with cysts. No gallstones, gallbladder wall thickening, or biliary dilatation. Pancreas: Unremarkable. No pancreatic ductal dilatation or surrounding inflammatory changes. Spleen: Normal in size without focal abnormality. Adrenals/Urinary Tract: Adrenal glands are unremarkable. Bilateral nonobstructive nephrolithiasis. No hydronephrosis or renal obstruction is noted. Bladder is unremarkable. Stomach/Bowel: The stomach is unremarkable. Status post appendectomy. No evidence of bowel obstruction or inflammation. Sigmoid diverticulosis without inflammation. Moderate amount of stool seen throughout the colon. Vascular/Lymphatic: Aortic atherosclerosis. Significantly enlarged periaortic adenopathy is noted compared with prior exam. This is most prominently seen in the left periaortic region, with the largest conglomerate measuring 2.5 x 2.0 cm. This is consistent with metastatic disease. Reproductive: Prostate is unremarkable in size. Penile implant reservoir is noted anteriorly in left side of pelvis. Other: No ascites or hernia is noted. Musculoskeletal: Minimal grade 1 anterolisthesis is noted secondary to bilateral L5 spondylolysis. IMPRESSION: Significantly enlarged periaortic adenopathy is noted compared to prior exam, most prominently seen in  the left periaortic region, consistent with worsening metastatic disease. Bilateral nonobstructive nephrolithiasis. Sigmoid diverticulosis without inflammation. Aortic Atherosclerosis (ICD10-I70.0). Electronically Signed   By: Lynwood Landy Raddle M.D.   On: 09/01/2023 12:22

## 2023-10-05 NOTE — Progress Notes (Signed)
 Patient here for follow up. Pt has questions regarding interaction between Ambien  and Gabapentin .

## 2023-10-05 NOTE — Assessment & Plan Note (Signed)
 Recommend oxycodone  5 mg every 8 hours as needed pain.

## 2023-10-05 NOTE — Assessment & Plan Note (Signed)
 Firmagon  monthly, plan to switch to long acting ADT.

## 2023-10-05 NOTE — Assessment & Plan Note (Signed)
 Monitor. ? Mood disorder associated with cancer, vs mood changes due to hormone change.  He was referred to [facility].

## 2023-10-07 ENCOUNTER — Other Ambulatory Visit: Payer: Self-pay

## 2023-10-07 ENCOUNTER — Other Ambulatory Visit: Payer: Self-pay | Admitting: Pharmacy Technician

## 2023-10-07 DIAGNOSIS — C61 Malignant neoplasm of prostate: Secondary | ICD-10-CM | POA: Diagnosis not present

## 2023-10-07 NOTE — Progress Notes (Signed)
 Specialty Pharmacy Ongoing Clinical Assessment Note  Leonard Stokes is a 78 y.o. male who is being followed by the specialty pharmacy service for RxSp Oncology   Patient's specialty medication(s) reviewed today: Darolutamide  (NUBEQA )   Missed doses in the last 4 weeks: 0   Patient/Caregiver did not have any additional questions or concerns.   Therapeutic benefit summary: Patient is achieving benefit   Adverse events/side effects summary: No adverse events/side effects   Patient's therapy is appropriate to: Continue    Goals Addressed             This Visit's Progress    Slow Disease Progression       Patient is on track. Patient will maintain adherence. PSA decreased significantly to 5.16 on 10/05/23.           Follow up: 3 months  Aviannah Castoro M Ronan Duecker Specialty Pharmacist

## 2023-10-07 NOTE — Progress Notes (Signed)
 Specialty Pharmacy Refill Coordination Note  Leonard Stokes is a 78 y.o. male contacted today regarding refills of specialty medication(s) Darolutamide  (NUBEQA )   Patient requested Delivery   Delivery date: 10/18/23   Verified address: [address]   [place] [address]   Medication will be filled on 10/17/23.

## 2023-10-10 ENCOUNTER — Ambulatory Visit
Admission: RE | Admit: 2023-10-10 | Discharge: 2023-10-10 | Disposition: A | Discharge status: Home / Self Care | Source: Ambulatory Visit | Attending: Oncology | Admitting: Oncology

## 2023-10-10 DIAGNOSIS — N281 Cyst of kidney, acquired: Secondary | ICD-10-CM | POA: Diagnosis not present

## 2023-10-10 DIAGNOSIS — C7951 Secondary malignant neoplasm of bone: Secondary | ICD-10-CM | POA: Insufficient documentation

## 2023-10-10 DIAGNOSIS — C61 Malignant neoplasm of prostate: Secondary | ICD-10-CM | POA: Insufficient documentation

## 2023-10-10 MED ORDER — GADOBUTROL 1 MMOL/ML IV SOLN
7.0000 mL | Freq: Once | INTRAVENOUS | Status: AC | PRN
  Administered 2023-10-10: 7 mL via INTRAVENOUS

## 2023-10-11 DIAGNOSIS — R590 Localized enlarged lymph nodes: Secondary | ICD-10-CM | POA: Diagnosis not present

## 2023-10-12 ENCOUNTER — Inpatient Hospital Stay

## 2023-10-12 ENCOUNTER — Inpatient Hospital Stay: Admitting: Occupational Therapy

## 2023-10-12 DIAGNOSIS — R531 Weakness: Secondary | ICD-10-CM

## 2023-10-12 NOTE — Therapy (Signed)
 [facility] - A Dept Of [place]. [facility] [address], [address] [place], [place], [address] Phone: [phone]   Fax:  [phone]  Occupational Therapy Screen  Patient Details  Name: Leonard Stokes MRN: [id number] Date of Birth: 1945/09/09 No data recorded  Encounter Date: 10/12/2023   OT End of Session - 10/12/23 1046     Visit Number 0          Past Medical History:  Diagnosis Date   Anemia    Arthritis    Atherosclerosis of aorta (HCC) 2018   BPH (benign prostatic hyperplasia)    Cancer (HCC) 2019   prostate   Chronic kidney disease 08/2016   kidney stone   Diverticulosis of sigmoid colon    History of kidney stones    Hypertension    Leg cramps     Past Surgical History:  Procedure Laterality Date   APPENDECTOMY     EXTRACORPOREAL SHOCK WAVE LITHOTRIPSY Right 08/19/2016   Procedure: EXTRACORPOREAL SHOCK WAVE LITHOTRIPSY (ESWL);  Surgeon: Penne Knee, MD;  Location: [facility] ORS;  Service: Urology;  Laterality: Right;   EXTRACORPOREAL SHOCK WAVE LITHOTRIPSY Left 03/24/2017   Procedure: EXTRACORPOREAL SHOCK WAVE LITHOTRIPSY (ESWL);  Surgeon: Penne Knee, MD;  Location: [facility] ORS;  Service: Urology;  Laterality: Left;   EYE MUSCLE SURGERY     INGUINAL HERNIA REPAIR Right 12/23/2017   Direct hernia, medium Ultra Pro mesh;  Surgeon: Dessa Reyes ORN, MD;  Location: [facility] ORS;  Service: General;  Laterality: Right;   JOINT REPLACEMENT     KNEE ARTHROPLASTY Left 11/15/2016   Procedure: COMPUTER ASSISTED TOTAL KNEE ARTHROPLASTY;  Surgeon: Mardee Lynwood SQUIBB, MD;  Location: [facility] ORS;  Service: Orthopedics;  Laterality: Left;   TOTAL KNEE ARTHROPLASTY Right     There were no vitals filed for this visit.     09/27/23 Josh Borders NP note: IMPRESSION: I met with patient and wife.  I introduced palliative care.   Patient tearful as he described the emotional impact of his cancer diagnosis.  However, he denies  depression or anxiety.  Patient is interested in referral to [facility] to assist with mental health coping.  Will also involve social work.   At baseline, patient lives at home with his wife.  He is independent with his own care but has been recently weak and ambulating with use of a cane.  He describes some balance instability.  Patient has worked with physical therapy in the past with good improvement.  Will refer to [facility] for rehab screening.   Oral intake is poor and patient has had some weight loss.  He is drinking nutritional supplements.  Referral to nutrition.   Patient's goals are aligned with current scope of treatment.  He is optimistic and hopes for improvement.  He understands that his cancer is incurable.   Patient does have advance directives but he is meeting with his attorney to complete new documents.  I did send him home with a MOST form to review with family.   Patient denies pain at present but states that he has intermittently required use of oxycodone , which works well for controlling his symptoms.  He request refill of oxycodone  today.  Discussed importance of a daily bowel regimen to prevent opioid-induced constipation.   PLAN: - Continue current scope of treatment - Referrals to [facility], social work, rehab screening, and nutrition - Refill oxycodone  - Daily bowel regimen - Follow-up telephone visit 1  month      OT SCREEN 10/12/23:  Patient arrive ambulating without a cane.  Wife accompanying him. Patient reports increased weakness since April.  Did had physical therapy prior to April but did not keep up with his exercises at home. Was also diagnosed since then with recurrence of cancer. Patient uses single-point cane at times.  Or furniture or hand-held assist Patient's 2 steps to enter the house from the front.  Walk-in shower with a nonslip surface but no chair. Rails around the toilet.  And higher seat. Use bedside commode at night to decrease fall  risk. More sedentary in the last few months.  History of bilateral total knee replacements Patient's high risk for fall.  Did not finish BERG balance test Patient several times lost balance in session.  Hand-held assist by OT. Recommend at this time physical therapy outpatient for increase strength and balance. Discussed with patient after finishing physical therapy needs to refer him to CARE program -cancer exercise classes II times a week for him to transition to after physical therapy to maintain his physical abilities. Patient and wife in agreement. Want to go to physical and sports rehab across [place] [address] [place] in [place].                                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 [facility] [address] [place], [place], [address] Phone: [phone]   Fax:  [phone]  Name: Leonard Stokes MRN: [id number] Date of Birth: 03/29/1945

## 2023-10-12 NOTE — Progress Notes (Signed)
 Nutrition Assessment   Reason for Assessment:  Referral from Palliative care    ASSESSMENT:  78 year old male with metastatic prostate cancer.  Past medical history of CKD, anemia, HTN, BPH.  Patient receiving darolutamide  and firmagon   Met with patient and wife following OT evaluation.  Reports good appetite at present however decreased appetite in July at the time of diagnosis.  Was feeling weak, fatigued, some pain, decreased appetite.  Typically eats cereal for breakfast or eggs with breakfast meat, bread, juice and whole milk, coffee. Eats lunch and supper. Supper is usually meat and couple of sides.  Drinks water, 2 boost shakes a day.  Denies stomach upset.  Some constipation.      Nutrition Focused Physical Exam:   Orbital Region: mild Buccal Region: moderate Upper Arm Region: moderate Thoracic and Lumbar Region: moderate Temple Region: moderate Clavicle Bone Region: unable to assess Shoulder and Acromion Bone Region: moderate Scapular Bone Region: moderate Dorsal Hand: normal Patellar Region: mild Anterior Thigh Region: moderate Posterior Calf Region: mild Edema (RD assessment): none Hair: observed Eyes: observed Mouth: observed Skin: observed Nails: observed   Medications: colace, MVI, calcium and vit d, Vit B 12   Labs: reviewed   Anthropometrics:   Height: 71 inches Weight: 159 lb 12.8 oz on 9/3 181 lb on 03/22/23 BMI: 22  12% weight loss in the last 7 months, concerning    Estimated Energy Needs  Kcals: 1800-2100 Protein: 73-86 g Fluid: > 1800 ml   NUTRITION DIAGNOSIS: Inadequate oral intake related to cancer and related side effects as evidenced by 12% weight loss in the last 7 months, mild-moderate fat loss, moderate muscle mass loss, eating less than 75% of estimated energy needs   MALNUTRITION DIAGNOSIS: Meets criteria for moderate malnutrition   INTERVENTION:  Encouraged high calorie, high protein diet. Handout provided Encouraged  high calorie oral nutrition supplement. Samples of ensure complete given Contact information provided   MONITORING, EVALUATION, GOAL: weight trends, intake   Next Visit: Thursday, Oct 2nd after MD visit  [place] B. Dasie SOLON, CSO, LDN Registered Dietitian [phone]

## 2023-10-13 ENCOUNTER — Inpatient Hospital Stay

## 2023-10-13 DIAGNOSIS — C61 Malignant neoplasm of prostate: Secondary | ICD-10-CM | POA: Diagnosis not present

## 2023-10-13 MED ORDER — DEGARELIX ACETATE 80 MG ~~LOC~~ SOLR
80.0000 mg | Freq: Once | SUBCUTANEOUS | Status: AC
  Administered 2023-10-13: 80 mg via SUBCUTANEOUS
  Filled 2023-10-13: qty 4

## 2023-10-17 ENCOUNTER — Other Ambulatory Visit: Payer: Self-pay

## 2023-10-17 ENCOUNTER — Ambulatory Visit

## 2023-10-18 DIAGNOSIS — C61 Malignant neoplasm of prostate: Secondary | ICD-10-CM | POA: Diagnosis not present

## 2023-10-20 ENCOUNTER — Ambulatory Visit: Attending: Oncology

## 2023-10-20 DIAGNOSIS — Z9181 History of falling: Secondary | ICD-10-CM | POA: Diagnosis not present

## 2023-10-20 DIAGNOSIS — C61 Malignant neoplasm of prostate: Secondary | ICD-10-CM | POA: Insufficient documentation

## 2023-10-20 DIAGNOSIS — R531 Weakness: Secondary | ICD-10-CM | POA: Diagnosis not present

## 2023-10-20 DIAGNOSIS — M6281 Muscle weakness (generalized): Secondary | ICD-10-CM | POA: Diagnosis not present

## 2023-10-20 DIAGNOSIS — R2681 Unsteadiness on feet: Secondary | ICD-10-CM | POA: Insufficient documentation

## 2023-10-20 NOTE — Therapy (Signed)
 OUTPATIENT PHYSICAL THERAPY EVALUATION   Patient Name: Leonard Stokes MRN: [id number] DOB:1945-06-20, 78 y.o., male Today's Date: 10/20/2023  END OF SESSION:  PT End of Session - 10/20/23 0946     Visit Number 1    Number of Visits 17    Date for Recertification  12/16/23    PT Start Time 0947    PT Stop Time 1029    PT Time Calculation (min) 42 min    Activity Tolerance Patient tolerated treatment well    Behavior During Therapy [facility] for tasks assessed/performed          Past Medical History:  Diagnosis Date   Anemia    Arthritis    Atherosclerosis of aorta (HCC) 2018   BPH (benign prostatic hyperplasia)    Cancer (HCC) 2019   prostate   Chronic kidney disease 08/2016   kidney stone   Diverticulosis of sigmoid colon    History of kidney stones    Hypertension    Leg cramps    Past Surgical History:  Procedure Laterality Date   APPENDECTOMY     EXTRACORPOREAL SHOCK WAVE LITHOTRIPSY Right 08/19/2016   Procedure: EXTRACORPOREAL SHOCK WAVE LITHOTRIPSY (ESWL);  Surgeon: Penne Knee, MD;  Location: [facility] ORS;  Service: Urology;  Laterality: Right;   EXTRACORPOREAL SHOCK WAVE LITHOTRIPSY Left 03/24/2017   Procedure: EXTRACORPOREAL SHOCK WAVE LITHOTRIPSY (ESWL);  Surgeon: Penne Knee, MD;  Location: [facility] OR[facility];  Service: Urology;  Laterality: Left;   EYE MUSCLE SURGERY     INGUINAL HERNIA REPAIR Right 12/23/2017   Direct hernia, medium Ultra Pro mesh;  Surgeon: Dessa Reyes ORN, MD;  Location: [facility] OR[facility];  Service: General;  Laterality: Right;   JOINT REPLACEMENT     KNEE ARTHROPLASTY Left 11/15/2016   Procedure: COMPUTER ASSISTED TOTAL KNEE ARTHROPLASTY;  Surgeon: Mardee Lynwood SQUIBB, MD;  Location: [facility] OR[facility];  Service: Orthopedics;  Laterality: Left;   TOTAL KNEE ARTHROPLASTY Right    Patient Active Problem List   Diagnosis Date Noted   Mood swing 10/05/2023   Gait instability 10/05/2023   Encounter for monitoring androgen deprivation therapy 09/15/2023   Neoplasm  related pain 09/15/2023   Prostate cancer metastatic to multiple sites [facility]) 12/01/2017   Right inguinal hernia 04/15/2017   Hypertension 11/30/2016   S/P total knee arthroplasty 11/15/2016   Personal history of tobacco use, presenting hazards to health 09/26/2014    PCP: Diedra Lame, MD   REFERRING PROVIDER: Babara Call, MD   REFERRING DIAG: C61 (ICD-10-CM) - Prostate cancer metastatic to multiple sites (HCC) R53.1 (ICD-10-CM) - Weakness  Rationale for Evaluation and Treatment: Rehabilitation  THERAPY DIAG:  Unsteadiness on feet - Plan: PT plan of care cert/re-cert  Muscle weakness (generalized) - Plan: PT plan of care cert/re-cert  History of falling - Plan: PT plan of care cert/re-cert  ONSET DATE: 10/05/2023 (Date PT referral signed)  SUBJECTIVE:  SUBJECTIVE STATEMENT: See pertinent history.   PERTINENT HISTORY:  Weakness, decreased balance. Uneven surfaces causes him to lose balance. Also is unsteady when he first wakes up in the morning. Difficulty with balance started about 6-8 weeks ago which is worsening. Pt was cleaning up after his dog and lost balance going forward and could not get up. His PT neighbor helped him up. Had difficulty using his L LE to get himself up.    Blood pressure is controlled per pt.  No latex allergies.    No pain currently. Pain is primarily at night, around his abdominal area.   Standing up from a seated position such as a toilet, is very difficult, needs a grab bar assist.      PAIN:  Are you having pain? Yes: NPRS scale: none at the moment.  Pain location: abdominal area and some in the back, might be cancer related.  Pain description: achy Aggravating factors: unknown, does not have it all the time.  Relieving factors: oxycodone   PRECAUTIONS:  Fall and Other: CA  RED FLAGS: Bowel or bladder incontinence: No and Cauda equina syndrome: No   WEIGHT BEARING RESTRICTIONS: No  FALLS:  Has patient fallen in last 6 months? Yes. Number of falls 6, pt states catching himself most of the time. Did not really fall, he just loss balance. Uneven surfaces causes him to lose balance  LIVING ENVIRONMENT: Lives with: lives with their spouse Lives in: House/apartment Stairs: Yes: Internal: 14 steps; on right going up and External: 2 steps; on right going up Has following equipment at home: Single point cane, Walker - 4 wheeled, and Grab bars  OCCUPATION: retired  PLOF: Independent  PATIENT GOALS: getting up from the chair and toilet, and improve B LE strength L > R  NEXT MD VISIT: about 1-2 weeks from now.  OBJECTIVE:  Note: Objective measures were completed at Evaluation unless otherwise noted.  DIAGNOSTIC FINDINGS:    PATIENT SURVEYS:  ABC scale: The Activities-Specific Balance Confidence (ABC) Scale 0% 10 20 30  40 50 60 70 80 90 100% No confidence<->completely confident  "How confident are you that you will not lose your balance or become unsteady when you . . .   Date tested 10/20/2023  Walk around the house 50%  2. Walk up or down stairs 50%  3. Bend over and pick up a slipper from in front of a closet floor 0%  4. Reach for a small can off a shelf at eye level 100%  5. Stand on tip toes and reach for something above your head 50%  6. Stand on a chair and reach for something 30%  7. Sweep the floor 60%  8. Walk outside the house to a car parked in the driveway 100%  9. Get into or out of a car 100%  10. Walk across a parking lot to the mall 80%  11. Walk up or down a ramp 80%  12. Walk in a crowded mall where people rapidly walk past you 100%  13. Are bumped into by people as you walk through the mall 80%  14. Step onto or off of an escalator while you are holding onto the railing 100%  15. Step onto or off an escalator  while holding onto parcels such that you cannot hold onto the railing 40%  16. Walk outside on icy sidewalks 0%  Total: #/16 1020%/16 = 63.75%     COGNITION: Overall cognitive status: Within functional limits for tasks assessed  SENSATION:   MUSCLE LENGTH:   POSTURE: wide base of support, hip and knee flexion, forward flexed posture, R hip ER,   PALPATION:   LUMBAR ROM:   AROM eval  Flexion in sitting  full  Extension WFL (LOB backwards in standing)  Right lateral flexion  WFL  Left lateral flexion WFL  Right rotation in sitting WFL  Left rotation in sitting WFL   (Blank rows = not tested)  LOWER EXTREMITY ROM:     Passive  Right eval Left eval  Hip flexion    Hip extension    Hip abduction    Hip adduction    Hip internal rotation    Hip external rotation    Knee flexion    Knee extension    Ankle dorsiflexion    Ankle plantarflexion    Ankle inversion    Ankle eversion     (Blank rows = not tested)  LOWER EXTREMITY MMT:    MMT Right eval Left eval  Hip flexion 4 3-  Hip extension (seated manually resisted) 4- 4-  Hip abduction (seated manually resisted) 4 4  Hip adduction    Hip internal rotation    Hip external rotation    Knee flexion 4+ 4  Knee extension 5 4+  Ankle dorsiflexion    Ankle plantarflexion    Ankle inversion    Ankle eversion     (Blank rows = not tested)  LUMBAR SPECIAL TESTS:  (-) heel to shin test (+) finger to nose on L side.   Difficulty with rapid alternating palms up and down.   FUNCTIONAL TESTS:  Lars Balance Scale:  Item Test date: 10/20/2023 Date:  Date:   Sitting to standing 3. able to stand independently using hands Insert SmartPhrase OPRCBERGREEVAL Insert SmartPhrase OPRCBERGREEVAL  2. Standing unsupported 2. able to stand 30 seconds unsupported    3. Sitting with back unsupported, feet supported 4. able to sit safely and securely for 2 minutes    4. Standing to sitting 3. controls descent by using hands     5. Pivot transfer  3. able to transfer safely with definite need of hands    6. Standing unsupported with eyes closed 3. able to stand 10 seconds with supervision    7. Standing unsupported with feet together 3. able to place feet together independently and stand 1 minute with supervision    8. Reaching forward with outstretched arms while standing 2. can reach forward 5 cm (2 inches)    9. Pick up object from the floor from standing 3. able to pick up slipper but needs supervision    10. Turning to look behind over left and right shoulders while standing 1. needs supervision when turning    11. Turn 360 degrees 1. needs close supervision or verbal cuing    12. Place alternate foot on step or stool while standing unsupported 0. needs assistance to keep from falling/unable to try    13. Standing unsupported one foot in front 1. needs help to step but can hold 15 seconds    14. Standing on one leg 0. unable to try of needs assist to prevent fall      Total Score 29/56 Total Score:    Total Score:       Score Interpretation: Score of <19 indicates high risk of falls.  Minimally Clinically Important Difference (MCID):  =DGI scores of<21/24 = 1.80 points DGI scores of >21/24 = 0.60 points   Alexa DASEN, Sharolyn SAILOR,  Brozgol M, Giladi N, Hausdorff JM. The Dynamic Gait Index in healthy older adults: the role of stair climbing, fear of falling and gender. Gait Posture. 2009 Feb;29(2):237-41. doi: 10.1016/j.gaitpost.2008.08.013. Epub 2008 Oct 8. PMID: 81154560; PMCID: EFR7290501.  Pardasaney, MYRTIS LOIS Bonus, GEANNIE POUR., et al. (2012). Sensitivity to change and responsiveness of four balance measures for community-dwelling older adults. Physical therapy 92(3): 388-397.   GAIT: Distance walked: 30 ft Assistive device utilized: Single point cane Level of assistance: Modified independence and SBA Comments: SPC on R, decreased stance R LE, forward flexed, decreased gait velocity.   TREATMENT DATE:  10/20/2023                                                                                                                                 PATIENT EDUCATION:  Education details: POC Person educated: Patient Education method: Explanation Education comprehension: verbalized understanding  HOME EXERCISE PROGRAM:   ASSESSMENT:  CLINICAL IMPRESSION: Patient is a 78 y.o. male who was seen today for physical therapy evaluation and treatment for weakness. He also presents with altered gait pattern and posture, decreased balance (low Berg Balance Test score), decreased coordination, decreased B LE strength, and difficulty ambulating, performing transfers, and performing standing tasks secondary to weakness and increased fall risk. Pt will benefit from skilled physical therapy services to address the aforementioned deficits.   OBJECTIVE IMPAIRMENTS: Abnormal gait, decreased balance, difficulty walking, decreased strength, improper body mechanics, and postural dysfunction.   ACTIVITY LIMITATIONS: carrying, lifting, bending, standing, squatting, stairs, transfers, toileting, dressing, and locomotion level  PARTICIPATION LIMITATIONS:   PERSONAL FACTORS: Age, Fitness, Past/current experiences, Time since onset of injury/illness/exacerbation, and 3+ comorbidities: Cancer, HTN, arthritis are also affecting patient's functional outcome.   REHAB POTENTIAL: Fair    CLINICAL DECISION MAKING: Evolving/moderate complexity; balance is worsening per pt.   EVALUATION COMPLEXITY: Moderate   GOALS: Goals reviewed with patient? Yes  SHORT TERM GOALS: Target date: 10/28/2023  Pt will be independent with his initial HEP to improve LE strength, balance, and function.  Baseline: Pt has not yet started his initial HEP (10/20/2023) Goal status: INITIAL   LONG TERM GOALS: Target date: 12/16/2023  Pt will improve his Berg Balance Test score to at least 48/56 as a demonstration of improved balance and decreased  need for AD.  Baseline: 29/56 (10/20/2023) Goal status: INITIAL  2.  Pt will improve his B LE strength by at least 1/2 MMT grade to promote ability to perform transfers and standing tasks with less difficulty and with improved balance.  Baseline:  MMT Right eval Left eval  Hip flexion 4 3-  Hip extension (seated manually resisted) 4- 4-  Hip abduction (seated manually resisted) 4 4  Knee flexion 4+ 4  Knee extension 5 4+   Goal status: INITIAL  3.  Pt will improve his Activities Specific Balance Confidence (ABC) scale by at least 15% as a demonstration of improved balance.  Baseline:  63.75% (  10/20/2023) Goal status: INITIAL   PLAN:  PT FREQUENCY: 1-2x/week  PT DURATION: 8 weeks  PLANNED INTERVENTIONS: 97110-Therapeutic exercises, 97530- Therapeutic activity, [id number]- Neuromuscular re-education, 97535- Self Care, [id number]- Manual therapy, [id number]- Gait training, Patient/Family education, Balance training, and Stair training.  PLAN FOR NEXT SESSION: posture, trunk, hip and knee strengthening, balance training, manual techniques, modalities PRN.    Kaelem Brach, PT, DPT 10/20/2023, 12:44 PM

## 2023-10-21 DIAGNOSIS — C61 Malignant neoplasm of prostate: Secondary | ICD-10-CM | POA: Diagnosis not present

## 2023-10-21 DIAGNOSIS — Z1331 Encounter for screening for depression: Secondary | ICD-10-CM | POA: Diagnosis not present

## 2023-10-21 DIAGNOSIS — Z0000 Encounter for general adult medical examination without abnormal findings: Secondary | ICD-10-CM | POA: Diagnosis not present

## 2023-10-21 DIAGNOSIS — Z23 Encounter for immunization: Secondary | ICD-10-CM | POA: Diagnosis not present

## 2023-10-21 DIAGNOSIS — I10 Essential (primary) hypertension: Secondary | ICD-10-CM | POA: Diagnosis not present

## 2023-10-24 ENCOUNTER — Other Ambulatory Visit: Payer: Self-pay | Admitting: *Deleted

## 2023-10-24 DIAGNOSIS — C61 Malignant neoplasm of prostate: Secondary | ICD-10-CM

## 2023-10-25 ENCOUNTER — Ambulatory Visit

## 2023-10-25 ENCOUNTER — Telehealth: Payer: Self-pay

## 2023-10-25 NOTE — Telephone Encounter (Signed)
 No show. Called patient who said that he did not realize that he had an appointment today. Will be able to make it to his next scheduled follow up appointment.

## 2023-10-27 ENCOUNTER — Ambulatory Visit

## 2023-10-27 DIAGNOSIS — M6281 Muscle weakness (generalized): Secondary | ICD-10-CM

## 2023-10-27 DIAGNOSIS — Z9181 History of falling: Secondary | ICD-10-CM

## 2023-10-27 DIAGNOSIS — R2681 Unsteadiness on feet: Secondary | ICD-10-CM

## 2023-10-27 NOTE — Therapy (Signed)
 OUTPATIENT PHYSICAL THERAPY TREATMENT   Patient Name: Leonard Stokes MRN: [id number] DOB:March 18, 1945, 78 y.o., male Today's Date: 10/27/2023  END OF SESSION:  PT End of Session - 10/27/23 0943     Visit Number 2    Number of Visits 17    Date for Recertification  12/16/23    PT Start Time 0943    PT Stop Time 1024    PT Time Calculation (min) 41 min    Activity Tolerance Patient tolerated treatment well    Behavior During Therapy [facility] for tasks assessed/performed           Past Medical History:  Diagnosis Date   Anemia    Arthritis    Atherosclerosis of aorta 2018   BPH (benign prostatic hyperplasia)    Cancer (HCC) 2019   prostate   Chronic kidney disease 08/2016   kidney stone   Diverticulosis of sigmoid colon    History of kidney stones    Hypertension    Leg cramps    Past Surgical History:  Procedure Laterality Date   APPENDECTOMY     EXTRACORPOREAL SHOCK WAVE LITHOTRIPSY Right 08/19/2016   Procedure: EXTRACORPOREAL SHOCK WAVE LITHOTRIPSY (ESWL);  Surgeon: Penne Knee, MD;  Location: [facility] OR[facility];  Service: Urology;  Laterality: Right;   EXTRACORPOREAL SHOCK WAVE LITHOTRIPSY Left 03/24/2017   Procedure: EXTRACORPOREAL SHOCK WAVE LITHOTRIPSY (ESWL);  Surgeon: Penne Knee, MD;  Location: [facility];  Service: Urology;  Laterality: Left;   EYE MUSCLE SURGERY     INGUINAL HERNIA REPAIR Right 12/23/2017   Direct hernia, medium Ultra Pro mesh;  Surgeon: Dessa Reyes ORN, MD;  Location: [facility];  Service: General;  Laterality: Right;   JOINT REPLACEMENT     KNEE ARTHROPLASTY Left 11/15/2016   Procedure: COMPUTER ASSISTED TOTAL KNEE ARTHROPLASTY;  Surgeon: Mardee Lynwood SQUIBB, MD;  Location: [facility] OR[facility];  Service: Orthopedics;  Laterality: Left;   TOTAL KNEE ARTHROPLASTY Right    Patient Active Problem List   Diagnosis Date Noted   Mood swing 10/05/2023   Gait instability 10/05/2023   Encounter for monitoring androgen deprivation therapy 09/15/2023   Neoplasm related  pain 09/15/2023   Prostate cancer metastatic to multiple sites [facility]) 12/01/2017   Right inguinal hernia 04/15/2017   Hypertension 11/30/2016   S/P total knee arthroplasty 11/15/2016   Personal history of tobacco use, presenting hazards to health 09/26/2014    PCP: Diedra Lame, MD   REFERRING PROVIDER: Babara Call, MD   REFERRING DIAG: C61 (ICD-10-CM) - Prostate cancer metastatic to multiple sites (HCC) R53.1 (ICD-10-CM) - Weakness  Rationale for Evaluation and Treatment: Rehabilitation  THERAPY DIAG:  Unsteadiness on feet  Muscle weakness (generalized)  History of falling  ONSET DATE: 10/05/2023 (Date PT referral signed)  SUBJECTIVE:  SUBJECTIVE STATEMENT: Walked about half a mile yesterday without the SPC   PERTINENT HISTORY:  Weakness, decreased balance. Uneven surfaces causes him to lose balance. Also is unsteady when he first wakes up in the morning. Difficulty with balance started about 6-8 weeks ago which is worsening. Pt was cleaning up after his dog and lost balance going forward and could not get up. His PT neighbor helped him up. Had difficulty using his L LE to get himself up.    Blood pressure is controlled per pt.  No latex allergies.    No pain currently. Pain is primarily at night, around his abdominal area.   Standing up from a seated position such as a toilet, is very difficult, needs a grab bar assist.      PAIN:  Are you having pain? Yes: NPRS scale: none at the moment.  Pain location: abdominal area and some in the back, might be cancer related.  Pain description: achy Aggravating factors: unknown, does not have it all the time.  Relieving factors: oxycodone   PRECAUTIONS: Fall and Other: CA  RED FLAGS: Bowel or bladder incontinence: No and Cauda equina  syndrome: No   WEIGHT BEARING RESTRICTIONS: No  FALLS:  Has patient fallen in last 6 months? Yes. Number of falls 6, pt states catching himself most of the time. Did not really fall, he just loss balance. Uneven surfaces causes him to lose balance  LIVING ENVIRONMENT: Lives with: lives with their spouse Lives in: House/apartment Stairs: Yes: Internal: 14 steps; on right going up and External: 2 steps; on right going up Has following equipment at home: Single point cane, Walker - 4 wheeled, and Grab bars  OCCUPATION: retired  PLOF: Independent  PATIENT GOALS: getting up from the chair and toilet, and improve B LE strength L > R  NEXT MD VISIT: about 1-2 weeks from now.  OBJECTIVE:  Note: Objective measures were completed at Evaluation unless otherwise noted.  DIAGNOSTIC FINDINGS:    PATIENT SURVEYS:  ABC scale: The Activities-Specific Balance Confidence (ABC) Scale 0% 10 20 30  40 50 60 70 80 90 100% No confidence<->completely confident  "How confident are you that you will not lose your balance or become unsteady when you . . .   Date tested 10/20/2023  Walk around the house 50%  2. Walk up or down stairs 50%  3. Bend over and pick up a slipper from in front of a closet floor 0%  4. Reach for a small can off a shelf at eye level 100%  5. Stand on tip toes and reach for something above your head 50%  6. Stand on a chair and reach for something 30%  7. Sweep the floor 60%  8. Walk outside the house to a car parked in the driveway 100%  9. Get into or out of a car 100%  10. Walk across a parking lot to the mall 80%  11. Walk up or down a ramp 80%  12. Walk in a crowded mall where people rapidly walk past you 100%  13. Are bumped into by people as you walk through the mall 80%  14. Step onto or off of an escalator while you are holding onto the railing 100%  15. Step onto or off an escalator while holding onto parcels such that you cannot hold onto the railing 40%  16. Walk  outside on icy sidewalks 0%  Total: #/16 1020%/16 = 63.75%     COGNITION: Overall cognitive status: Within functional  limits for tasks assessed     SENSATION:   MUSCLE LENGTH:   POSTURE: wide base of support, hip and knee flexion, forward flexed posture, R hip ER,   PALPATION:   LUMBAR ROM:   AROM eval  Flexion in sitting  full  Extension WFL (LOB backwards in standing)  Right lateral flexion  WFL  Left lateral flexion WFL  Right rotation in sitting WFL  Left rotation in sitting WFL   (Blank rows = not tested)  LOWER EXTREMITY ROM:     Passive  Right eval Left eval  Hip flexion    Hip extension    Hip abduction    Hip adduction    Hip internal rotation    Hip external rotation    Knee flexion    Knee extension    Ankle dorsiflexion    Ankle plantarflexion    Ankle inversion    Ankle eversion     (Blank rows = not tested)  LOWER EXTREMITY MMT:    MMT Right eval Left eval  Hip flexion 4 3-  Hip extension (seated manually resisted) 4- 4-  Hip abduction (seated manually resisted) 4 4  Hip adduction    Hip internal rotation    Hip external rotation    Knee flexion 4+ 4  Knee extension 5 4+  Ankle dorsiflexion    Ankle plantarflexion    Ankle inversion    Ankle eversion     (Blank rows = not tested)  LUMBAR SPECIAL TESTS:  (-) heel to shin test (+) finger to nose on L side.   Difficulty with rapid alternating palms up and down.   FUNCTIONAL TESTS:  Lars Balance Scale:  Item Test date: 10/20/2023 Date:  Date:   Sitting to standing 3. able to stand independently using hands Insert SmartPhrase OPRCBERGREEVAL Insert SmartPhrase OPRCBERGREEVAL  2. Standing unsupported 2. able to stand 30 seconds unsupported    3. Sitting with back unsupported, feet supported 4. able to sit safely and securely for 2 minutes    4. Standing to sitting 3. controls descent by using hands    5. Pivot transfer  3. able to transfer safely with definite need of hands    6.  Standing unsupported with eyes closed 3. able to stand 10 seconds with supervision    7. Standing unsupported with feet together 3. able to place feet together independently and stand 1 minute with supervision    8. Reaching forward with outstretched arms while standing 2. can reach forward 5 cm (2 inches)    9. Pick up object from the floor from standing 3. able to pick up slipper but needs supervision    10. Turning to look behind over left and right shoulders while standing 1. needs supervision when turning    11. Turn 360 degrees 1. needs close supervision or verbal cuing    12. Place alternate foot on step or stool while standing unsupported 0. needs assistance to keep from falling/unable to try    13. Standing unsupported one foot in front 1. needs help to step but can hold 15 seconds    14. Standing on one leg 0. unable to try of needs assist to prevent fall      Total Score 29/56 Total Score:    Total Score:       Score Interpretation: Score of <19 indicates high risk of falls.  Minimally Clinically Important Difference (MCID):  =DGI scores of<21/24 = 1.80 points DGI scores of >21/24 =  0.60 points   [employer], [place], Brozgol M, Giladi N, [place] JM. The Dynamic Gait Index in healthy older adults: the role of stair climbing, fear of falling and gender. Gait Posture. 2009 Feb;29(2):237-41. doi: 10.1016/j.gaitpost.2008.08.013. Epub 2008 Oct 8. PMID: 81154560; PMCID: EFR7290501.  Pardasaney, MYRTIS LOIS Bonus, GEANNIE POUR., et al. (2012). Sensitivity to change and responsiveness of four balance measures for community-dwelling older adults. Physical therapy 92(3): 388-397.   GAIT: Distance walked: 30 ft Assistive device utilized: Single point cane Level of assistance: Modified independence and SBA Comments: SPC on R, decreased stance R LE, forward flexed, decreased gait velocity.   TREATMENT DATE: 10/27/2023                                                                                                                                Neuromuscular Re education Pt was recommended to continue using his [facility] for now for safety   Seated L hip flexion with manual assist 5x3 with 3-5 second holds with eccentric return  Standing forward weight shift onto fore feet, empasis on ankle strategy 10x3 with 5 seconds   No UE assist Static standing feet shoulder width apart eyes closed 30 seconds x3  CGA to SBA  LOB 1x during 3rd set when verbal cues removed.   Static standing without UE assist   Feet together   eyes open 30 seconds x 3   Eyes closed 30 seconds x 3   Tandem stance    Eyes open   R foot in front 30 seconds    L foot in front 30 seconds     LOB x 1   Improved technique, movement at target joints, use of target muscles after mod verbal, visual, tactile cues.     PATIENT EDUCATION:  Education details: POC Person educated: Patient Education method: Explanation Education comprehension: verbalized understanding  HOME EXERCISE PROGRAM: Access Code: [email or web address] URL: [email or web address] Date: 10/27/2023 Prepared by: Emil Glassman  Exercises - Seated Hip Flexion  - 1 x daily - 7 x weekly - 3 sets - 5 reps  ASSESSMENT:  CLINICAL IMPRESSION: Demonstrates hip strategy while trying to maintain his center of gravity over base of support. Max verbal, tactile and mod visual cues needed to promote ankle strategy. Might demonstrate B gastroc tightness which may affect ankle strategy. Pt toerated session well withotu aggrvation of symptoms. Pt will benefit from continued skilled physical therapy services to improve strength, balance and function.    OBJECTIVE IMPAIRMENTS: Abnormal gait, decreased balance, difficulty walking, decreased strength, improper body mechanics, and postural dysfunction.   ACTIVITY LIMITATIONS: carrying, lifting, bending, standing, squatting, stairs, transfers, toileting, dressing, and locomotion level  PARTICIPATION  LIMITATIONS:   PERSONAL FACTORS: Age, Fitness, Past/current experiences, Time since onset of injury/illness/exacerbation, and 3+ comorbidities: Cancer, HTN, arthritis are also affecting patient's functional outcome.   REHAB POTENTIAL: Fair    CLINICAL DECISION MAKING: Evolving/moderate complexity; balance is worsening per pt.   EVALUATION  COMPLEXITY: Moderate   GOALS: Goals reviewed with patient? Yes  SHORT TERM GOALS: Target date: 10/28/2023  Pt will be independent with his initial HEP to improve LE strength, balance, and function.  Baseline: Pt has not yet started his initial HEP (10/20/2023) Goal status: INITIAL   LONG TERM GOALS: Target date: 12/16/2023  Pt will improve his Berg Balance Test score to at least 48/56 as a demonstration of improved balance and decreased need for AD.  Baseline: 29/56 (10/20/2023) Goal status: INITIAL  2.  Pt will improve his B LE strength by at least 1/2 MMT grade to promote ability to perform transfers and standing tasks with less difficulty and with improved balance.  Baseline:  MMT Right eval Left eval  Hip flexion 4 3-  Hip extension (seated manually resisted) 4- 4-  Hip abduction (seated manually resisted) 4 4  Knee flexion 4+ 4  Knee extension 5 4+   Goal status: INITIAL  3.  Pt will improve his Activities Specific Balance Confidence (ABC) scale by at least 15% as a demonstration of improved balance.  Baseline:  63.75% (10/20/2023) Goal status: INITIAL   PLAN:  PT FREQUENCY: 1-2x/week  PT DURATION: 8 weeks  PLANNED INTERVENTIONS: 97110-Therapeutic exercises, 97530- Therapeutic activity, [id number]- Neuromuscular re-education, 97535- Self Care, [id number]- Manual therapy, [phone]- Gait training, Patient/Family education, Balance training, and Stair training.  PLAN FOR NEXT SESSION: posture, trunk, hip and knee strengthening, balance training, manual techniques, modalities PRN.    Lawrence Roldan, PT, DPT 10/27/2023, 1:15 PM

## 2023-10-31 ENCOUNTER — Inpatient Hospital Stay: Admitting: Hospice and Palliative Medicine

## 2023-10-31 ENCOUNTER — Telehealth: Admitting: Hospice and Palliative Medicine

## 2023-10-31 DIAGNOSIS — Z515 Encounter for palliative care: Secondary | ICD-10-CM | POA: Diagnosis not present

## 2023-10-31 DIAGNOSIS — C61 Malignant neoplasm of prostate: Secondary | ICD-10-CM | POA: Diagnosis not present

## 2023-10-31 NOTE — Progress Notes (Signed)
 Virtual Visit via Telephone Note  I connected with [name] on 10/31/23 at 11:30 AM EDT by telephone and verified that I am speaking with the correct person using two identifiers.  Location: Patient: Home Provider: Clinic   I discussed the limitations, risks, security and privacy concerns of performing an evaluation and management service by telephone and the availability of in person appointments. I also discussed with the patient that there may be a patient responsible charge related to this service. The patient expressed understanding and agreed to proceed.   History of Present Illness: Leonard Stokes is a 78 y.o. 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  Patient referred to palliative care to address goals of manage ongoing symptoms.    Observations/Objective: I spoke with patient and wife by phone.  Patient reports he is doing well.  Denies any significant changes or concerns.  Patient says that he has not really had much pain.  He has not required oxycodone  recently.  He had questions about the possibility of future pain, which we discussed in detail.  He does endorse occasional loose stools and I suggested that he could try holding his MiraLAX  and see if that improves.  Appetite is reportedly good.  Performance status is improving with home physical therapy.  Overall, patient feels well.  Assessment and Plan: Metastatic prostate cancer -on [place]  and [place] .  PSA downtrending.  Patient doing well symptomatically.  Follow Up Instructions: Follow-up telephone visit 2 to 3 months   I discussed the assessment and treatment plan with the patient. The patient was provided an opportunity to ask questions and all were answered. The patient agreed with the plan and demonstrated an understanding of the instructions.   The patient was advised to call back or seek an in-person evaluation if the symptoms worsen or if the  condition fails to improve as anticipated.  I provided 20 minutes of non-face-to-face time during this encounter.   FONDA JONELLE MOWER, NP

## 2023-11-01 ENCOUNTER — Ambulatory Visit

## 2023-11-01 DIAGNOSIS — R2681 Unsteadiness on feet: Secondary | ICD-10-CM

## 2023-11-01 DIAGNOSIS — M6281 Muscle weakness (generalized): Secondary | ICD-10-CM

## 2023-11-01 DIAGNOSIS — Z9181 History of falling: Secondary | ICD-10-CM

## 2023-11-01 NOTE — Therapy (Signed)
 OUTPATIENT PHYSICAL THERAPY TREATMENT   Patient Name: Leonard Stokes MRN: [id number] DOB:Oct 30, 1945, 78 y.o., male Today's Date: 11/01/2023  END OF SESSION:  PT End of Session - 11/01/23 0906     Visit Number 3    Number of Visits 17    Date for Recertification  12/16/23    PT Start Time 0906    PT Stop Time 0945    PT Time Calculation (min) 39 min    Activity Tolerance Patient tolerated treatment well    Behavior During Therapy [facility] for tasks assessed/performed            Past Medical History:  Diagnosis Date   Anemia    Arthritis    Atherosclerosis of aorta 2018   BPH (benign prostatic hyperplasia)    Cancer (HCC) 2019   prostate   Chronic kidney disease 08/2016   kidney stone   Diverticulosis of sigmoid colon    History of kidney stones    Hypertension    Leg cramps    Past Surgical History:  Procedure Laterality Date   APPENDECTOMY     EXTRACORPOREAL SHOCK WAVE LITHOTRIPSY Right 08/19/2016   Procedure: EXTRACORPOREAL SHOCK WAVE LITHOTRIPSY (ESWL);  Surgeon: Penne Knee, MD;  Location: [facility] ORS;  Service: Urology;  Laterality: Right;   EXTRACORPOREAL SHOCK WAVE LITHOTRIPSY Left 03/24/2017   Procedure: EXTRACORPOREAL SHOCK WAVE LITHOTRIPSY (ESWL);  Surgeon: Penne Knee, MD;  Location: [facility] OR[facility];  Service: Urology;  Laterality: Left;   EYE MUSCLE SURGERY     INGUINAL HERNIA REPAIR Right 12/23/2017   Direct hernia, medium Ultra Pro mesh;  Surgeon: Dessa Reyes ORN, MD;  Location: [facility] OR[facility];  Service: General;  Laterality: Right;   JOINT REPLACEMENT     KNEE ARTHROPLASTY Left 11/15/2016   Procedure: COMPUTER ASSISTED TOTAL KNEE ARTHROPLASTY;  Surgeon: Mardee Lynwood SQUIBB, MD;  Location: [facility] OR[facility];  Service: Orthopedics;  Laterality: Left;   TOTAL KNEE ARTHROPLASTY Right    Patient Active Problem List   Diagnosis Date Noted   Mood swing 10/05/2023   Gait instability 10/05/2023   Encounter for monitoring androgen deprivation therapy 09/15/2023   Neoplasm  related pain 09/15/2023   Prostate cancer metastatic to multiple sites [facility]) 12/01/2017   Right inguinal hernia 04/15/2017   Hypertension 11/30/2016   S/P total knee arthroplasty 11/15/2016   Personal history of tobacco use, presenting hazards to health 09/26/2014    PCP: Diedra Lame, MD   REFERRING PROVIDER: Babara Call, MD   REFERRING DIAG: C61 (ICD-10-CM) - Prostate cancer metastatic to multiple sites (HCC) R53.1 (ICD-10-CM) - Weakness  Rationale for Evaluation and Treatment: Rehabilitation  THERAPY DIAG:  Unsteadiness on feet  Muscle weakness (generalized)  History of falling  ONSET DATE: 10/05/2023 (Date PT referral signed)  SUBJECTIVE:  SUBJECTIVE STATEMENT: Doing ok.   PERTINENT HISTORY:  Weakness, decreased balance. Uneven surfaces causes him to lose balance. Also is unsteady when he first wakes up in the morning. Difficulty with balance started about 6-8 weeks ago which is worsening. Pt was cleaning up after his dog and lost balance going forward and could not get up. His PT neighbor helped him up. Had difficulty using his L LE to get himself up.    Blood pressure is controlled per pt.  No latex allergies.    No pain currently. Pain is primarily at night, around his abdominal area.   Standing up from a seated position such as a toilet, is very difficult, needs a grab bar assist.      PAIN:  Are you having pain? Yes: NPRS scale: none at the moment.  Pain location: abdominal area and some in the back, might be cancer related.  Pain description: achy Aggravating factors: unknown, does not have it all the time.  Relieving factors: oxycodone   PRECAUTIONS: Fall and Other: CA  RED FLAGS: Bowel or bladder incontinence: No and Cauda equina syndrome: No   WEIGHT BEARING  RESTRICTIONS: No  FALLS:  Has patient fallen in last 6 months? Yes. Number of falls 6, pt states catching himself most of the time. Did not really fall, he just loss balance. Uneven surfaces causes him to lose balance  LIVING ENVIRONMENT: Lives with: lives with their spouse Lives in: House/apartment Stairs: Yes: Internal: 14 steps; on right going up and External: 2 steps; on right going up Has following equipment at home: Single point cane, Walker - 4 wheeled, and Grab bars  OCCUPATION: retired  PLOF: Independent  PATIENT GOALS: getting up from the chair and toilet, and improve B LE strength L > R  NEXT MD VISIT: about 1-2 weeks from now.  OBJECTIVE:  Note: Objective measures were completed at Evaluation unless otherwise noted.  DIAGNOSTIC FINDINGS:    PATIENT SURVEYS:  ABC scale: The Activities-Specific Balance Confidence (ABC) Scale 0% 10 20 30  40 50 60 70 80 90 100% No confidence<->completely confident  "How confident are you that you will not lose your balance or become unsteady when you . . .   Date tested 10/20/2023  Walk around the house 50%  2. Walk up or down stairs 50%  3. Bend over and pick up a slipper from in front of a closet floor 0%  4. Reach for a small can off a shelf at eye level 100%  5. Stand on tip toes and reach for something above your head 50%  6. Stand on a chair and reach for something 30%  7. Sweep the floor 60%  8. Walk outside the house to a car parked in the driveway 100%  9. Get into or out of a car 100%  10. Walk across a parking lot to the mall 80%  11. Walk up or down a ramp 80%  12. Walk in a crowded mall where people rapidly walk past you 100%  13. Are bumped into by people as you walk through the mall 80%  14. Step onto or off of an escalator while you are holding onto the railing 100%  15. Step onto or off an escalator while holding onto parcels such that you cannot hold onto the railing 40%  16. Walk outside on icy sidewalks 0%   Total: #/16 1020%/16 = 63.75%     COGNITION: Overall cognitive status: Within functional limits for tasks assessed  SENSATION:   MUSCLE LENGTH:   POSTURE: wide base of support, hip and knee flexion, forward flexed posture, R hip ER,   PALPATION:   LUMBAR ROM:   AROM eval  Flexion in sitting  full  Extension WFL (LOB backwards in standing)  Right lateral flexion  WFL  Left lateral flexion WFL  Right rotation in sitting WFL  Left rotation in sitting WFL   (Blank rows = not tested)  LOWER EXTREMITY ROM:     Passive  Right eval Left eval  Hip flexion    Hip extension    Hip abduction    Hip adduction    Hip internal rotation    Hip external rotation    Knee flexion    Knee extension    Ankle dorsiflexion    Ankle plantarflexion    Ankle inversion    Ankle eversion     (Blank rows = not tested)  LOWER EXTREMITY MMT:    MMT Right eval Left eval  Hip flexion 4 3-  Hip extension (seated manually resisted) 4- 4-  Hip abduction (seated manually resisted) 4 4  Hip adduction    Hip internal rotation    Hip external rotation    Knee flexion 4+ 4  Knee extension 5 4+  Ankle dorsiflexion    Ankle plantarflexion    Ankle inversion    Ankle eversion     (Blank rows = not tested)  LUMBAR SPECIAL TESTS:  (-) heel to shin test (+) finger to nose on L side.   Difficulty with rapid alternating palms up and down.   FUNCTIONAL TESTS:  Lars Balance Scale:  Item Test date: 10/20/2023 Date:  Date:   Sitting to standing 3. able to stand independently using hands Insert SmartPhrase OPRCBERGREEVAL Insert SmartPhrase OPRCBERGREEVAL  2. Standing unsupported 2. able to stand 30 seconds unsupported    3. Sitting with back unsupported, feet supported 4. able to sit safely and securely for 2 minutes    4. Standing to sitting 3. controls descent by using hands    5. Pivot transfer  3. able to transfer safely with definite need of hands    6. Standing unsupported with  eyes closed 3. able to stand 10 seconds with supervision    7. Standing unsupported with feet together 3. able to place feet together independently and stand 1 minute with supervision    8. Reaching forward with outstretched arms while standing 2. can reach forward 5 cm (2 inches)    9. Pick up object from the floor from standing 3. able to pick up slipper but needs supervision    10. Turning to look behind over left and right shoulders while standing 1. needs supervision when turning    11. Turn 360 degrees 1. needs close supervision or verbal cuing    12. Place alternate foot on step or stool while standing unsupported 0. needs assistance to keep from falling/unable to try    13. Standing unsupported one foot in front 1. needs help to step but can hold 15 seconds    14. Standing on one leg 0. unable to try of needs assist to prevent fall      Total Score 29/56 Total Score:    Total Score:       Score Interpretation: Score of <19 indicates high risk of falls.  Minimally Clinically Important Difference (MCID):  =DGI scores of<21/24 = 1.80 points DGI scores of >21/24 = 0.60 points   Alexa DASEN, Sharolyn SAILOR,  Brozgol M, Giladi N, Hausdorff JM. The Dynamic Gait Index in healthy older adults: the role of stair climbing, fear of falling and gender. Gait Posture. 2009 Feb;29(2):237-41. doi: 10.1016/j.gaitpost.2008.08.013. Epub 2008 Oct 8. PMID: 81154560; PMCID: EFR7290501.  Pardasaney, MYRTIS LOIS Bonus, GEANNIE POUR., et al. (2012). Sensitivity to change and responsiveness of four balance measures for community-dwelling older adults. Physical therapy 92(3): 388-397.   GAIT: Distance walked: 30 ft Assistive device utilized: Single point cane Level of assistance: Modified independence and SBA Comments: SPC on R, decreased stance R LE, forward flexed, decreased gait velocity.   TREATMENT DATE: 11/01/2023                                                                                                                                Neuromuscular Re education Pt was recommended to continue using his [facility] for now for safety   No UE assist Static standing feet shoulder width apart eyes closed 30 seconds x3  CGA to SBA   Standing forward weight shift onto fore feet, empasis on ankle strategy 10x3 with 5 seconds  Mod A from PT to perform properly during first set. Better able to perform himself during last 2 sets  Performed to promote ankle strategy for balance   Static standing without UE assist   Feet together     Eyes closed 30 seconds x 3   eyes open 30 seconds x 3   Tandem stance    Eyes open   R foot in front 30 seconds    L foot in front 30 seconds x 3    More challenging    Pt states his L LE is usually more weak.    Seated L hip flexion with manual assist 5x3 with 5 second holds with eccentric return  Standing static mini lunge with R UE assist   L 10x2     Improved technique, movement at target joints, use of target muscles after mod verbal, visual, tactile cues.     PATIENT EDUCATION:  Education details: POC Person educated: Patient Education method: Explanation Education comprehension: verbalized understanding  HOME EXERCISE PROGRAM: Access Code: [email or web address] URL: [email or web address] Date: 10/27/2023 Prepared by: Emil Glassman  Exercises - Seated Hip Flexion  - 1 x daily - 7 x weekly - 3 sets - 5 reps  ASSESSMENT:  CLINICAL IMPRESSION:  Continued working on improving ankle strategy as well as ability to place and maintain his center of gravity over his base of support to promote balance. Utilized eyes closed as well as more narrow base of support to challenge balance. Also worked on L hip flexion and L quadriceps and glute strengthening to promote ability to support himself with his L LE as well as to promote L hip flexion to not trip on objects.  Pt toerated session well withotu aggrvation of symptoms. Pt will benefit from continued skilled  physical therapy services to improve strength, balance and function.  OBJECTIVE IMPAIRMENTS: Abnormal gait, decreased balance, difficulty walking, decreased strength, improper body mechanics, and postural dysfunction.   ACTIVITY LIMITATIONS: carrying, lifting, bending, standing, squatting, stairs, transfers, toileting, dressing, and locomotion level  PARTICIPATION LIMITATIONS:   PERSONAL FACTORS: Age, Fitness, Past/current experiences, Time since onset of injury/illness/exacerbation, and 3+ comorbidities: Cancer, HTN, arthritis are also affecting patient's functional outcome.   REHAB POTENTIAL: Fair    CLINICAL DECISION MAKING: Evolving/moderate complexity; balance is worsening per pt.   EVALUATION COMPLEXITY: Moderate   GOALS: Goals reviewed with patient? Yes  SHORT TERM GOALS: Target date: 10/28/2023  Pt will be independent with his initial HEP to improve LE strength, balance, and function.  Baseline: Pt has not yet started his initial HEP (10/20/2023) Goal status: INITIAL   LONG TERM GOALS: Target date: 12/16/2023  Pt will improve his Berg Balance Test score to at least 48/56 as a demonstration of improved balance and decreased need for AD.  Baseline: 29/56 (10/20/2023) Goal status: INITIAL  2.  Pt will improve his B LE strength by at least 1/2 MMT grade to promote ability to perform transfers and standing tasks with less difficulty and with improved balance.  Baseline:  MMT Right eval Left eval  Hip flexion 4 3-  Hip extension (seated manually resisted) 4- 4-  Hip abduction (seated manually resisted) 4 4  Knee flexion 4+ 4  Knee extension 5 4+   Goal status: INITIAL  3.  Pt will improve his Activities Specific Balance Confidence (ABC) scale by at least 15% as a demonstration of improved balance.  Baseline:  63.75% (10/20/2023) Goal status: INITIAL   PLAN:  PT FREQUENCY: 1-2x/week  PT DURATION: 8 weeks  PLANNED INTERVENTIONS: 97110-Therapeutic exercises,  97530- Therapeutic activity, [id number]- Neuromuscular re-education, 97535- Self Care, [id number]- Manual therapy, [phone]- Gait training, Patient/Family education, Balance training, and Stair training.  PLAN FOR NEXT SESSION: posture, trunk, hip and knee strengthening, balance training, manual techniques, modalities PRN.    Stormi Vandevelde, PT, DPT 11/01/2023, 9:57 AM

## 2023-11-02 ENCOUNTER — Other Ambulatory Visit

## 2023-11-02 DIAGNOSIS — C61 Malignant neoplasm of prostate: Secondary | ICD-10-CM | POA: Diagnosis not present

## 2023-11-03 ENCOUNTER — Encounter: Payer: Self-pay | Admitting: Oncology

## 2023-11-03 ENCOUNTER — Ambulatory Visit: Payer: Self-pay | Admitting: Physician Assistant

## 2023-11-03 ENCOUNTER — Inpatient Hospital Stay (HOSPITAL_BASED_OUTPATIENT_CLINIC_OR_DEPARTMENT_OTHER): Admitting: Oncology

## 2023-11-03 ENCOUNTER — Inpatient Hospital Stay

## 2023-11-03 ENCOUNTER — Inpatient Hospital Stay: Attending: Hospice and Palliative Medicine

## 2023-11-03 VITALS — BP 132/79 | HR 79 | Temp 96.9°F | Resp 18 | Ht 71.0 in | Wt 164.5 lb

## 2023-11-03 DIAGNOSIS — Z79899 Other long term (current) drug therapy: Secondary | ICD-10-CM | POA: Insufficient documentation

## 2023-11-03 DIAGNOSIS — C775 Secondary and unspecified malignant neoplasm of intrapelvic lymph nodes: Secondary | ICD-10-CM | POA: Diagnosis not present

## 2023-11-03 DIAGNOSIS — C61 Malignant neoplasm of prostate: Secondary | ICD-10-CM | POA: Diagnosis not present

## 2023-11-03 DIAGNOSIS — R2681 Unsteadiness on feet: Secondary | ICD-10-CM | POA: Diagnosis not present

## 2023-11-03 DIAGNOSIS — Z79818 Long term (current) use of other agents affecting estrogen receptors and estrogen levels: Secondary | ICD-10-CM

## 2023-11-03 DIAGNOSIS — D696 Thrombocytopenia, unspecified: Secondary | ICD-10-CM | POA: Insufficient documentation

## 2023-11-03 DIAGNOSIS — C7951 Secondary malignant neoplasm of bone: Secondary | ICD-10-CM | POA: Insufficient documentation

## 2023-11-03 LAB — CBC WITH DIFFERENTIAL (CANCER CENTER ONLY)
Abs Immature Granulocytes: 0.04 K/uL (ref 0.00–0.07)
Basophils Absolute: 0 K/uL (ref 0.0–0.1)
Basophils Relative: 0 %
Eosinophils Absolute: 0.1 K/uL (ref 0.0–0.5)
Eosinophils Relative: 2 %
HCT: 32.6 % — ABNORMAL LOW (ref 39.0–52.0)
Hemoglobin: 10.7 g/dL — ABNORMAL LOW (ref 13.0–17.0)
Immature Granulocytes: 1 %
Lymphocytes Relative: 16 %
Lymphs Abs: 0.7 K/uL (ref 0.7–4.0)
MCH: 30.3 pg (ref 26.0–34.0)
MCHC: 32.8 g/dL (ref 30.0–36.0)
MCV: 92.4 fL (ref 80.0–100.0)
Monocytes Absolute: 1.7 K/uL — ABNORMAL HIGH (ref 0.1–1.0)
Monocytes Relative: 38 %
Neutro Abs: 2 K/uL (ref 1.7–7.7)
Neutrophils Relative %: 43 %
Platelet Count: 117 K/uL — ABNORMAL LOW (ref 150–400)
RBC: 3.53 MIL/uL — ABNORMAL LOW (ref 4.22–5.81)
RDW: 18.5 % — ABNORMAL HIGH (ref 11.5–15.5)
WBC Count: 4.5 K/uL (ref 4.0–10.5)
nRBC: 0 % (ref 0.0–0.2)

## 2023-11-03 LAB — CMP (CANCER CENTER ONLY)
ALT: 25 U/L (ref 0–44)
AST: 25 U/L (ref 15–41)
Albumin: 4.1 g/dL (ref 3.5–5.0)
Alkaline Phosphatase: 148 U/L — ABNORMAL HIGH (ref 38–126)
Anion gap: 7 (ref 5–15)
BUN: 19 mg/dL (ref 8–23)
CO2: 25 mmol/L (ref 22–32)
Calcium: 9.3 mg/dL (ref 8.9–10.3)
Chloride: 104 mmol/L (ref 98–111)
Creatinine: 0.68 mg/dL (ref 0.61–1.24)
GFR, Estimated: >60 mL/min (ref >60)
Glucose, Bld: 102 mg/dL — ABNORMAL HIGH (ref 70–99)
Potassium: 4.8 mmol/L (ref 3.5–5.1)
Sodium: 136 mmol/L (ref 135–145)
Total Bilirubin: 0.9 mg/dL (ref 0.0–1.2)
Total Protein: 6.8 g/dL (ref 6.5–8.1)

## 2023-11-03 LAB — PSA: Prostate Specific Ag, Serum: 0.5 ng/mL (ref 0.0–4.0)

## 2023-11-03 NOTE — Assessment & Plan Note (Addendum)
 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  Indeterminate. Right supraclavicular lymph node biopsy confirmed metastatic adenocarcinoma consistent with prostate origin. On androgen deprivation therapy with Firmagon  Labs are reviewed and discussed with patient. PSA has responded well He tolerates Darolutamide  600mg  BID.  Continue current regimen.   Referred to palliative care service.

## 2023-11-03 NOTE — Assessment & Plan Note (Signed)
 11/10/23 switch to long acting ADT - Eligard 

## 2023-11-03 NOTE — Assessment & Plan Note (Signed)
 Continue PT

## 2023-11-03 NOTE — Progress Notes (Signed)
 Nutrition Follow-up:   Patient with metastatic prostate cancer.  Patient receiving darolutamide  and firmagon .  Met with patient and wife following MD visit.  Reports that appetite is good/better.  Eating cereal with whole milk and 3 times a week will eat eggs, sasuage and toast.  Lunch is left overs.  Dinner is out to eat or prepared meal.  Has been snacking. Drinking 2 boost shakes a day.      Medications: reviewed  Labs: reviewed  Anthropometrics:   Weight 164 lb 8 oz today  159 lb 12.8 oz on 9/3 181 lb on 03/22/23  Does not want to get back to 181 lb   NUTRITION DIAGNOSIS: Inadequate oral intake stable  Moderate malnutrition improved   INTERVENTION:  Can decrease or stop boost shakes if weight stays stable.  Encouraged well balanced diet including plant foods and lean protein Wife has RD contact information and will call with questions  NEXT VISIT: no follow-up RD available as needed  Bette Brienza B. Dasie SOLON, CSO, LDN Registered Dietitian [phone]

## 2023-11-03 NOTE — Progress Notes (Signed)
 Patient doing well and eating better. He has no new or acute concerns at this time.

## 2023-11-03 NOTE — Progress Notes (Addendum)
 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  Indeterminate. Right supraclavicular lymph node biopsy confirmed metastatic adenocarcinoma consistent with prostate origin. On androgen deprivation therapy with Firmagon  Labs are reviewed and discussed with patient. PSA has responded well He tolerates Darolutamide  600mg  BID.  Continue current regimen.   Referred to palliative care service.  Encounter for monitoring androgen deprivation therapy 11/10/23 switch to long acting ADT - Eligard    Gait instability Continue PT    Orders Placed This Encounter  Procedures   CMP (Cancer Center only)    Standing Status:   Future    Expected Date:   12/01/2023    Expiration Date:   02/29/2024   CBC with Differential (Cancer Center Only)    Standing Status:   Future    Expected Date:   12/01/2023    Expiration Date:   02/29/2024   PSA    Standing Status:   Future    Expected Date:   12/01/2023    Expiration Date:   02/29/2024   Follow-up in 2 months. We spent sufficient time to discuss many aspect of care, questions were answered to patient's satisfaction.  All questions were answered. The patient knows to call the clinic with any problems, questions or concerns.  Call Babara, MD, PhD [facility] Hematology Oncology 11/03/2023    HISTORY OF PRESENTING  ILLNESS:  Leonard Stokes 78 y.o. male presents to establish care for prostate cancer I have reviewed his chart and materials related to his cancer extensively and collaborated history with the patient. Summary of oncologic history is as follows: Oncology History  Prostate cancer metastatic to multiple sites [facility])  06/03/2017 Imaging   CT abdomen pelvis showed 1. Development of bilateral pelvic sidewall adenopathy, consistent with nodal metastasis. 2.  Aortic Atherosclerosis (ICD10-I70.0). 3. Right-sided urinary bladder entering a right inguinal hernia, similar. 4. 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 At that time, biopsy showed Gleason 4+5 and 4+4 involving all cores on the right up to 96% of the prostate.  Staging indicated bilateral external iliac lymphadenopathy up to 3 cm on the left and 1.8 cm on the right.  Bone scan was negative.  Per neurology note, surgery option was discussed and patient declined.  Patient was started on adjuvant deprivation therapy, status post EBRT and he completed 2.5 years of ADT.  PSA had remained undetectable until 08/29/2023-PSA has increased to 66.    09/01/2023 Imaging   Patient went to emergency room for evaluation of constipation and abdominal/lower back pain.  As part of the workup, patient had a CT chest abdomen pelvis done.  CT chest showed mildly enlarged bilateral hilar lymph node which are indeterminate.  CT abdomen pelvis with contrast showed Significantly enlarged left periaortic adenopathy, most prominently seen in the left periaortic region, bilateral nonobstructive  nephrolithiasis, sigmoid diverticulosis without inflammation   09/05/2023 Cancer Staging   Staging form: Prostate, AJCC 8th Edition - Clinical stage from 09/05/2023: Stage IVB (rcTX, rcN1, rcM1, PSA: 75) - Signed by Babara Call, MD on  09/06/2023 Stage prefix: Recurrence Prostate specific antigen (PSA) range: 20 or greater   09/06/2023 Imaging   PSMA PET scan showed   1. Widespread tracer-avid osseous metastatic disease without corresponding sclerotic bone lesions. New from the prior PET-CT. 2. Multiple tracer-avid thoracic, abdominal, and pelvic lymph nodes. New from prior PET-CT. 3. Heterogeneous hepatic uptake with multiple geographic areas of relative increased radiotracer uptake, indeterminate for infiltrative hepatocellular disease. No discrete tracer-avid lesion identified. No suspicious liver lesions on CT from 09/01/2023.   09/09/2023 Procedure   Left supraclavicular lymph node biopsy showed metastatic adenocarcinoma.  Additional IHC staining showed the adenocarcinoma is positive with NKX3.1 and prostein while negative with prostate-specific antigen (PSA).  The immunohistochemical findings are consistent with metastatic prostate adenocarcinoma      09/20/23 started on Darolutamide  600mg  BID. Tolerated well.  He reports feeling well at baseline.   Gait instability and physical strength has improved with physical therapy. Pain has resolved.    MEDICAL HISTORY:  Past Medical History:  Diagnosis Date   Anemia    Arthritis    Atherosclerosis of aorta 2018   BPH (benign prostatic hyperplasia)    Cancer (HCC) 2019   prostate   Chronic kidney disease 08/2016   kidney stone   Diverticulosis of sigmoid colon    History of kidney stones    Hypertension    Leg cramps     SURGICAL HISTORY: Past Surgical History:  Procedure Laterality Date   APPENDECTOMY     EXTRACORPOREAL SHOCK WAVE LITHOTRIPSY Right 08/19/2016   Procedure: EXTRACORPOREAL SHOCK WAVE LITHOTRIPSY (ESWL);  Surgeon: Penne Knee, MD;  Location: [facility] ORS;  Service: Urology;  Laterality: Right;   EXTRACORPOREAL SHOCK WAVE LITHOTRIPSY Left 03/24/2017   Procedure: EXTRACORPOREAL SHOCK WAVE LITHOTRIPSY (ESWL);  Surgeon: Penne Knee, MD;   Location: [facility] ORS;  Service: Urology;  Laterality: Left;   EYE MUSCLE SURGERY     INGUINAL HERNIA REPAIR Right 12/23/2017   Direct hernia, medium Ultra Pro mesh;  Surgeon: Dessa Reyes ORN, MD;  Location: [facility] ORS;  Service: General;  Laterality: Right;   JOINT REPLACEMENT     KNEE ARTHROPLASTY Left 11/15/2016   Procedure: COMPUTER ASSISTED TOTAL KNEE ARTHROPLASTY;  Surgeon: Mardee Lynwood SQUIBB, MD;  Location: [facility] ORS;  Service: Orthopedics;  Laterality: Left;   TOTAL KNEE ARTHROPLASTY Right     SOCIAL HISTORY: Social History   Socioeconomic History   Marital status: Married    Spouse name: Not on file   Number of children: Not on file   Years of education: Not on file   Highest education level: Not on file  Occupational History   Not on file  Tobacco Use   Smoking status: Former    Current packs/day: 0.00    Average packs/day: 3.0 packs/day for 20.0 years (60.0 ttl pk-yrs)    Types: Cigarettes    Start date: 05/31/1976    Quit date: 05/31/1996    Years since quitting: 27.4   Smokeless tobacco: Never  Vaping Use   Vaping status: Never Used  Substance and Sexual Activity   Alcohol use: Yes    Comment: 4-5 week   Drug use: No   Sexual activity: Yes  Other Topics Concern   Not on file  Social History Narrative   ** Merged History  Encounter **       Social Drivers of Health   Financial Resource Strain: Patient Declined (10/21/2023)   Received from [facility]   Overall Financial Resource Strain (CARDIA)    Difficulty of Paying Living Expenses: Patient declined  Food Insecurity: Patient Declined (10/21/2023)   Received from [facility]   Hunger Vital Sign    Within the past 12 months, you worried that your food would run out before you got the money to buy more.: Patient declined    Within the past 12 months, the food you bought just didn't last and you didn't have money to get more.: Patient declined  Transportation Needs: Patient  Declined (10/21/2023)   Received from [facility] - Transportation    In the past 12 months, has lack of transportation kept you from medical appointments or from getting medications?: Patient declined    Lack of Transportation (Non-Medical): Patient declined  Physical Activity: Not on file  Stress: No Stress Concern Present (09/05/2023)   [employer] of Occupational Health - Occupational Stress Questionnaire    Feeling of Stress: Not at all  Social Connections: Not on file  Intimate Partner Violence: Not At Risk (09/05/2023)   Humiliation, Afraid, Rape, and Kick questionnaire    Fear of Current or Ex-Partner: No    Emotionally Abused: No    Physically Abused: No    Sexually Abused: No    FAMILY HISTORY: History reviewed. No pertinent family history.  ALLERGIES:  is allergic to codeine.  MEDICATIONS:  Current Outpatient Medications  Medication Sig Dispense Refill   acetaminophen  (TYLENOL ) 500 MG tablet Take 1,000 mg by mouth every 6 (six) hours as needed for moderate pain.      Ascorbic Acid  (VITAMIN C ) 1000 MG tablet Take 1,000 mg by mouth daily.     atorvastatin (LIPITOR) 20 MG tablet Take by mouth.     B Complex Vitamins (B COMPLEX-B12 PO) Take 1 tablet by mouth daily.     Calcium Acetate, Phos Binder, (CALCIUM ACETATE PO) Take 2 tablets by mouth daily.     Cholecalciferol  (VITAMIN D-3) 125 MCG (5000 UT) TABS Take 5,000 Units by mouth daily.     cyanocobalamin 1000 MCG tablet Take by mouth.     cyclobenzaprine  (FLEXERIL ) 10 MG tablet Take 1 tablet (10 mg total) by mouth 3 (three) times daily as needed for muscle spasms. 30 tablet 0   darolutamide  (NUBEQA ) 300 MG tablet Take 2 tablets (600 mg total) by mouth 2 (two) times daily with a meal. 120 tablet 1   docusate sodium  (COLACE) 100 MG capsule Take 1 capsule (100 mg total) by mouth 2 (two) times daily. 60 capsule 2   famotidine  (PEPCID ) 20 MG tablet Take 20 mg by mouth at bedtime.     ferrous  sulfate 325 (65 FE) MG tablet Take 325 mg by mouth daily with breakfast.     finasteride  (PROSCAR ) 5 MG tablet TAKE ONE TABLET (5 MG) BY MOUTH EVERY OTHER DAY 30 tablet 11   gabapentin  (NEURONTIN ) 300 MG capsule Take 300 mg by mouth at bedtime.     hydrochlorothiazide  (HYDRODIURIL ) 12.5 MG tablet  (Patient not taking: Reported on 10/05/2023)     ipratropium (ATROVENT) 0.03 % nasal spray SMARTSIG:1-2 Spray(s) Both Nares 3 Times Daily PRN     losartan -hydrochlorothiazide  (HYZAAR) 50-12.5 MG tablet Take 1 tablet by mouth every morning.      meloxicam (MOBIC) 15 MG tablet Take 1  tablet by mouth daily.     methocarbamol (ROBAXIN) 500 MG tablet Take 500 mg by mouth. (Patient not taking: Reported on 10/05/2023)     Misc Natural Products (OSTEO BI-FLEX TRIPLE STRENGTH PO) Take 1 tablet by mouth daily.      Multiple Vitamins-Minerals (SENIOR MULTIVITAMIN PLUS PO) Take 1 tablet by mouth daily.     NONFORMULARY OR COMPOUNDED ITEM Trimix (30/1/10)-(Pap/Phent/PGE)  Test Dose  1ml vial   Qty #3 Refills 0  [facility] [phone] Fax [phone] (Patient not taking: Reported on 09/27/2023) 3 each 0   oxyCODONE  (ROXICODONE ) 5 MG immediate release tablet Take 1 tablet (5 mg total) by mouth every 8 (eight) hours as needed for severe pain (pain score 7-10) or breakthrough pain. 30 tablet 0   polyethylene glycol (MIRALAX ) 17 g packet Take 17 g by mouth daily. 30 packet 0   tamsulosin  (FLOMAX ) 0.4 MG CAPS capsule TAKE ONE CAPSULE DAILY 30 MINUTES AFTER THE SAME MEAL EACH DAY 30 capsule 11   vitamin E 180 MG (400 UNITS) capsule Take by mouth.     zolpidem  (AMBIEN ) 10 MG tablet Take 10 mg by mouth at bedtime as needed for sleep.      No current facility-administered medications for this visit.    Review of Systems  Constitutional:  Positive for fatigue. Negative for appetite change, chills, fever and unexpected weight change.  HENT:   Negative for hearing loss and voice change.   Eyes:  Negative for  eye problems and icterus.  Respiratory:  Negative for chest tightness, cough and shortness of breath.   Cardiovascular:  Negative for chest pain and leg swelling.  Gastrointestinal:  Negative for abdominal distention and abdominal pain.  Endocrine: Negative for hot flashes.  Genitourinary:  Negative for difficulty urinating, dysuria and frequency.   Musculoskeletal:  Positive for arthralgias and gait problem.  Skin:  Negative for itching and rash.  Neurological:  Positive for gait problem. Negative for light-headedness and numbness.  Hematological:  Negative for adenopathy. Does not bruise/bleed easily.  Psychiatric/Behavioral:  Negative for confusion.      PHYSICAL EXAMINATION: ECOG PERFORMANCE STATUS: 1 - Symptomatic but completely ambulatory  Vitals:   11/03/23 1005  BP: 132/79  Pulse: 79  Resp: 18  Temp: (!) 96.9 F (36.1 C)  SpO2: 100%   Filed Weights   11/03/23 1005  Weight: 164 lb 8 oz (74.6 kg)    Physical Exam Constitutional:      General: He is not in acute distress.    Appearance: He is not diaphoretic.  HENT:     Head: Normocephalic and atraumatic.  Eyes:     General: No scleral icterus. Cardiovascular:     Rate and Rhythm: Normal rate.  Pulmonary:     Effort: Pulmonary effort is normal. No respiratory distress.     Breath sounds: Normal breath sounds.  Abdominal:     General: There is no distension.     Palpations: Abdomen is soft.     Tenderness: There is no abdominal tenderness.  Musculoskeletal:        General: Normal range of motion.     Cervical back: Normal range of motion and neck supple.  Skin:    General: Skin is warm and dry.     Findings: No erythema.  Neurological:     Mental Status: He is alert and oriented to person, place, and time. Mental status is at baseline.     Motor: No abnormal muscle tone.  Coordination: Coordination normal.  Psychiatric:        Mood and Affect: Mood and affect normal.      LABORATORY DATA:  I have  reviewed the data as listed    Latest Ref Rng & Units 11/03/2023    9:47 AM 10/05/2023    8:20 AM 09/23/2023    4:15 PM  CBC  WBC 4.0 - 10.5 K/uL 4.5  8.1  9.4   Hemoglobin 13.0 - 17.0 g/dL 89.2  89.6  89.6   Hematocrit 39.0 - 52.0 % 32.6  31.9  31.2   Platelets 150 - 400 K/uL 117  214  351       Latest Ref Rng & Units 11/03/2023    9:47 AM 10/05/2023    8:20 AM 09/23/2023    4:15 PM  CMP  Glucose 70 - 99 mg/dL 897  881  830   BUN 8 - 23 mg/dL 19  18  18    Creatinine 0.61 - 1.24 mg/dL 9.31  9.24  9.05   Sodium 135 - 145 mmol/L 136  136  134   Potassium 3.5 - 5.1 mmol/L 4.8  4.4  4.8   Chloride 98 - 111 mmol/L 104  103  97   CO2 22 - 32 mmol/L 25  25  32   Calcium 8.9 - 10.3 mg/dL 9.3  9.0  8.7   Total Protein 6.5 - 8.1 g/dL 6.8  6.6    Total Bilirubin 0.0 - 1.2 mg/dL 0.9  0.6    Alkaline Phos 38 - 126 U/L 148  266    AST 15 - 41 U/L 25  26    ALT 0 - 44 U/L 25  26       RADIOGRAPHIC STUDIES: I have personally reviewed the radiological images as listed and agreed with the findings in the report. US  CORE BIOPSY (LYMPH NODES) Result Date: 09/09/2023 INDICATION: 78 year old with prostate cancer. Recent PET-CT suggests widespread metastatic disease. Patient presents for ultrasound-guided lymph node biopsy. EXAM: ULTRASOUND-GUIDED CORE BIOPSY OF LEFT SUPRACLAVICULAR LYMPH NODE MEDICATIONS: 1% lidocaine  for local anesthetic ANESTHESIA/SEDATION: None FLUOROSCOPY TIME:  None COMPLICATIONS: None immediate. PROCEDURE: Informed written consent was obtained from the patient after a thorough discussion of the procedural risks, benefits and alternatives. All questions were addressed. A timeout was performed prior to the initiation of the procedure. Ultrasound was used to identify enlarged and abnormal left supraclavicular lymph nodes. A left supraclavicular lymph node was targeted for biopsy. The left lower neck was prepped with chlorhexidine  and sterile field was created. Skin was anesthetized with 1%  lidocaine . Small incision was made. Using ultrasound guidance, an 18 gauge core needle was directed into the lymph node and a total of 5 core biopsies were obtained. Specimens placed on a Telfa pad with saline. Bandage placed over the puncture site. Ultrasound images were taken and saved for this procedure. FINDINGS: Enlarged and abnormal left supraclavicular lymph nodes. Most accessible lymph node was biopsied. Five adequate specimens obtained. No immediate bleeding or hematoma formation. IMPRESSION: Ultrasound-guided core biopsy of a left supraclavicular lymph node. Electronically Signed   By: Juliene Balder M.D.   On: 09/09/2023 15:30   NM PET (PSMA) SKULL TO MID THIGH Addendum Date: 09/09/2023 ADDENDUM #1 EXAM: PROSTATE PET SKULL BASE TO MID THIGHS 09/06/2023 02:38:55 PM TECHNIQUE: PET imaging was obtained from skull vertex to mid thighs. Computed tomography was used for attenuation correction and localization. Fusion imaging was obtained. RADIOPHARMACEUTICAL: 8.65 mCi F-18 flotufolastaf (Posluma ) injected intravenously. COMPARISON:  06/24/2022 CLINICAL HISTORY: Prostate cancer, residual or recurrent disease suspected. No recent biopsy. Diagnosed 2019. Went thru chemo/rad therapy 2019. Lupron  injection. Current PSA 75.44. FINDINGS: PROSTATE AND PROSTATE BED: Normal low-level 28F-FDG uptake within the prostate gland without focal area of hypermetabolism. SUV max within the prostate measures 3.0 cm, axial image 152. LYMPH NODES: Multiple tracer avid thoracic lymph nodes are identified. Index left supraclavicular node measures 7 mm with SUV max of 16.9, axial image 38. Index lower left paratracheal lymph node measures 6 mm with SUV max of 15.4. The index right hilar lymph node has an SUV max of 12.6. Multiple tracer-avid abdominal and pelvic lymph nodes are identified. Index left retroperitoneal lymph node measures 1.2 cm with SUV max of 19.6, axial image 97. Index retrocaval lymph node measures 1.24 cm with SUV max  of 20.23, axial image 103. Left posterior pelvic lymph node measures 0.8 cm with SUV max of 10.7. BONES: Widespread tracer-avid osseous metastatic disease identified without corresponding changes on the CT images. Index lesion within the right scapula has an SUV max of 19.1, axial image 46. Index lesion within the posterior aspect of the L4 vertebra has an SUV max of 30, axial image 116. Index lesion in the right iliac bone has an SUV max of 23.6, axial image 11/29. OTHER PET FINDINGS: There is heterogeneous uptake throughout both lobes of the liver with multiple geographic areas of relative increased radiotracer uptake. Significance of this is indeterminate. No discrete tracer avid lesion identified although underlying infiltrative hepatocellular disease cannot be excluded. On the CT from 09/01/2023, there were no suspicious liver lesions. No tracer avid pulmonary nodules. Aortic atherosclerosis and coronary artery calcifications. Penile prosthesis is identified. IMPRESSION: 1. Widespread tracer-avid osseous metastatic disease without corresponding sclerotic bone lesions. New from the prior PET-CT. 2. Multiple tracer-avid thoracic, abdominal, and pelvic lymph nodes. New from prior PET-CT. 3. Heterogeneous hepatic uptake with multiple geographic areas of relative increased radiotracer uptake, indeterminate for infiltrative hepatocellular disease. No discrete tracer-avid lesion identified. No suspicious liver lesions on CT from 09/01/2023. Electronically signed by: Waddell Calk MD 09/09/2023 08:42 AM EDT RP Workstation: [id number]   Result Date: 09/09/2023 ORIGINAL REPORT  EXAM: PROSTATE PET SKULL BASE TO MID THIGHS 09/06/2023 02:38:55 PM TECHNIQUE: PET imaging was obtained from skull vertex to mid thighs. Computed tomography was used for attenuation correction and localization. Fusion imaging was obtained. RADIOPHARMACEUTICAL: 8.65 mCi F-18 flotufolastaf (Posluma ) injected intravenously. COMPARISON: 06/24/2022  CLINICAL HISTORY: Prostate cancer, residual or recurrent disease suspected. No recent biopsy. Diagnosed 2019. Went thru chemo/rad therapy 2019. Lupron  injection. Current PSA 75.44. FINDINGS: PROSTATE AND PROSTATE BED: Normal low-level 28F-FDG uptake within the prostate gland without focal area of hypermetabolism. SUV max within the prostate measures 3.0 cm, axial image 152. LYMPH NODES: Multiple tracer rapid thoracic lymph nodes are identified. Index right supraclavicular node measures 7 mm with SUV max of 16.9, axial image 38. Index lower left paratracheal lymph node measures 6 mm with SUV max of 15.4. The index right hilar lymph node has an SUV max of 12.6. Multiple tracer-avid abdominal and pelvic lymph nodes are identified. Index left retroperitoneal lymph node measures 1.2 cm with SUV max of 19.6, axial image 97. Index retrocaval lymph node measures 1.24 cm with SUV max of 20.23, axial image 103. Left posterior pelvic lymph node measures 0.8 cm with SUV max of 10.7. BONES: Widespread tracer-avid osseous metastatic disease identified without corresponding changes on the CT images. Index lesion within the right scapula has an SUV max of  19.1, axial image 46. Index lesion within the posterior aspect of the L4 vertebra has an SUV max of 30, axial image 116. Index lesion in the right iliac bone has an SUV max of 23.6, axial image 11/29. OTHER PET FINDINGS: There is heterogeneous uptake throughout both lobes of the liver with multiple geographic areas of relative increased radiotracer uptake. Significance of this is indeterminate. No discrete tracer avid lesion identified although underlying infiltrative hepatocellular disease cannot be excluded. On the CT from 09/01/2023, there were no suspicious liver lesions. No tracer avid pulmonary nodules. Aortic atherosclerosis and coronary artery calcifications. Penile prosthesis is identified. IMPRESSION: 1. Widespread tracer-avid osseous metastatic disease without  corresponding sclerotic bone lesions. New from the prior PET-CT. 2. Multiple tracer-avid thoracic, abdominal, and pelvic lymph nodes. New from prior PET-CT. 3. Heterogeneous hepatic uptake with multiple geographic areas of relative increased radiotracer uptake, indeterminate for infiltrative hepatocellular disease. No discrete tracer-avid lesion identified. No suspicious liver lesions on CT from 09/01/2023. Electronically signed by: Waddell Calk MD 09/06/2023 04:09 PM EDT RP Workstation: [id number]   CT Chest W Contrast Result Date: 09/01/2023 CLINICAL DATA:  left posterior rib pain. History of prostate carcinoma. * Tracking Code: BO * EXAM: CT CHEST WITH CONTRAST TECHNIQUE: Multidetector CT imaging of the chest was performed during intravenous contrast administration. RADIATION DOSE REDUCTION: This exam was performed according to the departmental dose-optimization program which includes automated exposure control, adjustment of the mA and/or kV according to patient size and/or use of iterative reconstruction technique. CONTRAST:  75mL OMNIPAQUE  IOHEXOL  300 MG/ML  SOLN COMPARISON:  CT scan chest from 10/02/2014 and PET-CT scan from 06/23/2017. FINDINGS: Cardiovascular: Normal cardiac size. No pericardial effusion. No aortic aneurysm. There are coronary artery calcifications, in keeping with coronary artery disease. There are also mild-to-moderate peripheral atherosclerotic vascular calcifications of thoracic aorta and its major branches. Mediastinum/Nodes: Visualized thyroid  gland appears grossly unremarkable. No solid / cystic mediastinal masses. The esophagus is nondistended precluding optimal assessment. There are few mildly enlarged bilateral hilar lymph nodes with largest in the right hilum measuring up to 10 x 17 mm. These are indeterminate in etiology. No axillary or mediastinal lymphadenopathy by size criteria. Lungs/Pleura: The central tracheo-bronchial tree is patent. There are patchy areas of linear,  plate-like atelectasis and/or scarring throughout bilateral lungs. No mass or consolidation. No pleural effusion or pneumothorax. No suspicious lung nodules. There are multiple sub 5 mm noncalcified opacities in the minor fissure (marked with electronic arrow sign on series 2005), favored to represent intra fissural lymph nodes. No suspicious lung nodules. Upper Abdomen: There is left para-aortic conglomerate nodal mass measuring up to 2.4 x 3.1 cm. There are additional retroperitoneal lymph nodes as well. Please refer to same-day acquired CT scan abdomen and pelvis report for additional details. Remaining visualized upper abdominal viscera within normal limits. Musculoskeletal: The visualized soft tissues of the chest wall are grossly unremarkable. No suspicious osseous lesions. There are mild multilevel degenerative changes in the visualized spine. IMPRESSION: 1. There are few mildly enlarged bilateral hilar lymph nodes, which are indeterminate in etiology. There are enlarged upper abdominal lymph nodes as well, better evaluated on the same-day acquired CT scan abdomen and pelvis. 2. Otherwise, no metastatic disease identified within the chest. 3. Multiple other nonacute observations, as described above. Aortic Atherosclerosis (ICD10-I70.0). Electronically Signed   By: Ree Molt M.D.   On: 09/01/2023 15:05   CT ABDOMEN PELVIS W CONTRAST Result Date: 09/01/2023 CLINICAL DATA:  Acute lower abdominal pain. History of prostate cancer. EXAM:  CT ABDOMEN AND PELVIS WITH CONTRAST TECHNIQUE: Multidetector CT imaging of the abdomen and pelvis was performed using the standard protocol following bolus administration of intravenous contrast. RADIATION DOSE REDUCTION: This exam was performed according to the departmental dose-optimization program which includes automated exposure control, adjustment of the mA and/or kV according to patient size and/or use of iterative reconstruction technique. CONTRAST:  OMNIPAQUE   IOHEXOL  300 MG/ML  SOLN COMPARISON:  Jun 03, 2017. FINDINGS: Lower chest: No acute abnormality. Hepatobiliary: Grossly stable low densities are noted in the hepatic parenchyma most consistent with cysts. No gallstones, gallbladder wall thickening, or biliary dilatation. Pancreas: Unremarkable. No pancreatic ductal dilatation or surrounding inflammatory changes. Spleen: Normal in size without focal abnormality. Adrenals/Urinary Tract: Adrenal glands are unremarkable. Bilateral nonobstructive nephrolithiasis. No hydronephrosis or renal obstruction is noted. Bladder is unremarkable. Stomach/Bowel: The stomach is unremarkable. Status post appendectomy. No evidence of bowel obstruction or inflammation. Sigmoid diverticulosis without inflammation. Moderate amount of stool seen throughout the colon. Vascular/Lymphatic: Aortic atherosclerosis. Significantly enlarged periaortic adenopathy is noted compared with prior exam. This is most prominently seen in the left periaortic region, with the largest conglomerate measuring 2.5 x 2.0 cm. This is consistent with metastatic disease. Reproductive: Prostate is unremarkable in size. Penile implant reservoir is noted anteriorly in left side of pelvis. Other: No ascites or hernia is noted. Musculoskeletal: Minimal grade 1 anterolisthesis is noted secondary to bilateral L5 spondylolysis. IMPRESSION: Significantly enlarged periaortic adenopathy is noted compared to prior exam, most prominently seen in the left periaortic region, consistent with worsening metastatic disease. Bilateral nonobstructive nephrolithiasis. Sigmoid diverticulosis without inflammation. Aortic Atherosclerosis (ICD10-I70.0). Electronically Signed   By: Lynwood Landy Raddle M.D.   On: 09/01/2023 12:22

## 2023-11-08 ENCOUNTER — Ambulatory Visit: Admitting: Physician Assistant

## 2023-11-08 ENCOUNTER — Ambulatory Visit: Attending: Oncology

## 2023-11-08 VITALS — BP 128/79 | HR 87 | Ht 71.0 in | Wt 165.0 lb

## 2023-11-08 DIAGNOSIS — R2681 Unsteadiness on feet: Secondary | ICD-10-CM | POA: Diagnosis not present

## 2023-11-08 DIAGNOSIS — Z9181 History of falling: Secondary | ICD-10-CM | POA: Diagnosis not present

## 2023-11-08 DIAGNOSIS — R350 Frequency of micturition: Secondary | ICD-10-CM

## 2023-11-08 DIAGNOSIS — C61 Malignant neoplasm of prostate: Secondary | ICD-10-CM | POA: Diagnosis not present

## 2023-11-08 DIAGNOSIS — M6281 Muscle weakness (generalized): Secondary | ICD-10-CM | POA: Insufficient documentation

## 2023-11-08 DIAGNOSIS — N401 Enlarged prostate with lower urinary tract symptoms: Secondary | ICD-10-CM | POA: Diagnosis not present

## 2023-11-08 MED ORDER — GEMTESA 75 MG PO TABS: 75.0000 mg | ORAL_TABLET | Freq: Every day | ORAL | Status: DC

## 2023-11-08 NOTE — Therapy (Signed)
 OUTPATIENT PHYSICAL THERAPY TREATMENT   Patient Name: Leonard Stokes MRN: [id number] DOB:1945/03/26, 78 y.o., male Today's Date: 11/08/2023  END OF SESSION:  PT End of Session - 11/08/23 0905     Visit Number 4    Number of Visits 17    Date for Recertification  12/16/23    PT Start Time 0905    PT Stop Time 0945    PT Time Calculation (min) 40 min    Activity Tolerance Patient tolerated treatment well    Behavior During Therapy [facility] for tasks assessed/performed             Past Medical History:  Diagnosis Date   Anemia    Arthritis    Atherosclerosis of aorta 2018   BPH (benign prostatic hyperplasia)    Cancer (HCC) 2019   prostate   Chronic kidney disease 08/2016   kidney stone   Diverticulosis of sigmoid colon    History of kidney stones    Hypertension    Leg cramps    Past Surgical History:  Procedure Laterality Date   APPENDECTOMY     EXTRACORPOREAL SHOCK WAVE LITHOTRIPSY Right 08/19/2016   Procedure: EXTRACORPOREAL SHOCK WAVE LITHOTRIPSY (ESWL);  Surgeon: Penne Knee, MD;  Location: [facility] ORS;  Service: Urology;  Laterality: Right;   EXTRACORPOREAL SHOCK WAVE LITHOTRIPSY Left 03/24/2017   Procedure: EXTRACORPOREAL SHOCK WAVE LITHOTRIPSY (ESWL);  Surgeon: Penne Knee, MD;  Location: [facility] ORS;  Service: Urology;  Laterality: Left;   EYE MUSCLE SURGERY     INGUINAL HERNIA REPAIR Right 12/23/2017   Direct hernia, medium Ultra Pro mesh;  Surgeon: Dessa Reyes ORN, MD;  Location: [facility] ORS;  Service: General;  Laterality: Right;   JOINT REPLACEMENT     KNEE ARTHROPLASTY Left 11/15/2016   Procedure: COMPUTER ASSISTED TOTAL KNEE ARTHROPLASTY;  Surgeon: Mardee Lynwood SQUIBB, MD;  Location: [facility] ORS;  Service: Orthopedics;  Laterality: Left;   TOTAL KNEE ARTHROPLASTY Right    Patient Active Problem List   Diagnosis Date Noted   Mood swing 10/05/2023   Gait instability 10/05/2023   Encounter for monitoring androgen deprivation therapy 09/15/2023   Neoplasm  related pain 09/15/2023   Prostate cancer metastatic to multiple sites [facility]) 12/01/2017   Right inguinal hernia 04/15/2017   Hypertension 11/30/2016   S/P total knee arthroplasty 11/15/2016   Personal history of tobacco use, presenting hazards to health 09/26/2014    PCP: Diedra Lame, MD   REFERRING PROVIDER: Babara Call, MD   REFERRING DIAG: C61 (ICD-10-CM) - Prostate cancer metastatic to multiple sites (HCC) R53.1 (ICD-10-CM) - Weakness  Rationale for Evaluation and Treatment: Rehabilitation  THERAPY DIAG:  Unsteadiness on feet  Muscle weakness (generalized)  History of falling  ONSET DATE: 10/05/2023 (Date PT referral signed)  SUBJECTIVE:  SUBJECTIVE STATEMENT: Doing ok. Usually has to get his sea legs in the morning. Had a burning sensation behind his L knee yesterday and wanted to use a heating pad. Will have a heating pad ready tonight.    Better able to lift his L LE to put his pants on.     PERTINENT HISTORY:  Weakness, decreased balance. Uneven surfaces causes him to lose balance. Also is unsteady when he first wakes up in the morning. Difficulty with balance started about 6-8 weeks ago which is worsening. Pt was cleaning up after his dog and lost balance going forward and could not get up. His PT neighbor helped him up. Had difficulty using his L LE to get himself up.    Blood pressure is controlled per pt.  No latex allergies.    No pain currently. Pain is primarily at night, around his abdominal area.   Standing up from a seated position such as a toilet, is very difficult, needs a grab bar assist.      PAIN:  Are you having pain? Yes: NPRS scale: none at the moment.  Pain location: abdominal area and some in the back, might be cancer related.  Pain description:  achy Aggravating factors: unknown, does not have it all the time.  Relieving factors: oxycodone   PRECAUTIONS: Fall and Other: CA  RED FLAGS: Bowel or bladder incontinence: No and Cauda equina syndrome: No   WEIGHT BEARING RESTRICTIONS: No  FALLS:  Has patient fallen in last 6 months? Yes. Number of falls 6, pt states catching himself most of the time. Did not really fall, he just loss balance. Uneven surfaces causes him to lose balance  LIVING ENVIRONMENT: Lives with: lives with their spouse Lives in: House/apartment Stairs: Yes: Internal: 14 steps; on right going up and External: 2 steps; on right going up Has following equipment at home: Single point cane, Walker - 4 wheeled, and Grab bars  OCCUPATION: retired  PLOF: Independent  PATIENT GOALS: getting up from the chair and toilet, and improve B LE strength L > R  NEXT MD VISIT: about 1-2 weeks from now.  OBJECTIVE:  Note: Objective measures were completed at Evaluation unless otherwise noted.  DIAGNOSTIC FINDINGS:    PATIENT SURVEYS:  ABC scale: The Activities-Specific Balance Confidence (ABC) Scale 0% 10 20 30  40 50 60 70 80 90 100% No confidence<->completely confident  "How confident are you that you will not lose your balance or become unsteady when you . . .   Date tested 10/20/2023  Walk around the house 50%  2. Walk up or down stairs 50%  3. Bend over and pick up a slipper from in front of a closet floor 0%  4. Reach for a small can off a shelf at eye level 100%  5. Stand on tip toes and reach for something above your head 50%  6. Stand on a chair and reach for something 30%  7. Sweep the floor 60%  8. Walk outside the house to a car parked in the driveway 100%  9. Get into or out of a car 100%  10. Walk across a parking lot to the mall 80%  11. Walk up or down a ramp 80%  12. Walk in a crowded mall where people rapidly walk past you 100%  13. Are bumped into by people as you walk through the mall 80%   14. Step onto or off of an escalator while you are holding onto the railing 100%  15.  Step onto or off an escalator while holding onto parcels such that you cannot hold onto the railing 40%  16. Walk outside on icy sidewalks 0%  Total: #/16 1020%/16 = 63.75%     COGNITION: Overall cognitive status: Within functional limits for tasks assessed     SENSATION:   MUSCLE LENGTH:   POSTURE: wide base of support, hip and knee flexion, forward flexed posture, R hip ER,   PALPATION:   LUMBAR ROM:   AROM eval  Flexion in sitting  full  Extension WFL (LOB backwards in standing)  Right lateral flexion  WFL  Left lateral flexion WFL  Right rotation in sitting WFL  Left rotation in sitting WFL   (Blank rows = not tested)  LOWER EXTREMITY ROM:     Passive  Right eval Left eval  Hip flexion    Hip extension    Hip abduction    Hip adduction    Hip internal rotation    Hip external rotation    Knee flexion    Knee extension    Ankle dorsiflexion    Ankle plantarflexion    Ankle inversion    Ankle eversion     (Blank rows = not tested)  LOWER EXTREMITY MMT:    MMT Right eval Left eval  Hip flexion 4 3-  Hip extension (seated manually resisted) 4- 4-  Hip abduction (seated manually resisted) 4 4  Hip adduction    Hip internal rotation    Hip external rotation    Knee flexion 4+ 4  Knee extension 5 4+  Ankle dorsiflexion    Ankle plantarflexion    Ankle inversion    Ankle eversion     (Blank rows = not tested)  LUMBAR SPECIAL TESTS:  (-) heel to shin test (+) finger to nose on L side.   Difficulty with rapid alternating palms up and down.   FUNCTIONAL TESTS:  Lars Balance Scale:  Item Test date: 10/20/2023 Date:  Date:   Sitting to standing 3. able to stand independently using hands Insert SmartPhrase OPRCBERGREEVAL Insert SmartPhrase OPRCBERGREEVAL  2. Standing unsupported 2. able to stand 30 seconds unsupported    3. Sitting with back unsupported, feet  supported 4. able to sit safely and securely for 2 minutes    4. Standing to sitting 3. controls descent by using hands    5. Pivot transfer  3. able to transfer safely with definite need of hands    6. Standing unsupported with eyes closed 3. able to stand 10 seconds with supervision    7. Standing unsupported with feet together 3. able to place feet together independently and stand 1 minute with supervision    8. Reaching forward with outstretched arms while standing 2. can reach forward 5 cm (2 inches)    9. Pick up object from the floor from standing 3. able to pick up slipper but needs supervision    10. Turning to look behind over left and right shoulders while standing 1. needs supervision when turning    11. Turn 360 degrees 1. needs close supervision or verbal cuing    12. Place alternate foot on step or stool while standing unsupported 0. needs assistance to keep from falling/unable to try    13. Standing unsupported one foot in front 1. needs help to step but can hold 15 seconds    14. Standing on one leg 0. unable to try of needs assist to prevent fall      Total  Score 29/56 Total Score:    Total Score:       Score Interpretation: Score of <19 indicates high risk of falls.  Minimally Clinically Important Difference (MCID):  =DGI scores of<21/24 = 1.80 points DGI scores of >21/24 = 0.60 points   [place] T, Inbar-Borovsky N, Brozgol M, Giladi N, [place] JM. The Dynamic Gait Index in healthy older adults: the role of stair climbing, fear of falling and gender. Gait Posture. 2009 Feb;29(2):237-41. doi: 10.1016/j.gaitpost.2008.08.013. Epub 2008 Oct 8. PMID: 81154560; PMCID: EFR7290501.  Pardasaney, MYRTIS LOIS Bonus, GEANNIE POUR., et al. (2012). Sensitivity to change and responsiveness of four balance measures for community-dwelling older adults. Physical therapy 92(3): 388-397.   GAIT: Distance walked: 30 ft Assistive device utilized: Single point cane Level of assistance: Modified  independence and SBA Comments: SPC on R, decreased stance R LE, forward flexed, decreased gait velocity.   TREATMENT DATE: 11/08/2023                                                                                                                               Neuromuscular Re education Pt was recommended to continue using his [facility] for now for safety   No UE assist    Standing forward weight shift onto fore feet, empasis on ankle strategy 10x3 with 5 seconds  Mod A to min A from PT to perform properly  Performed to promote ankle strategy for balance   Standing with B UE assist   gastroc stretch    R 1 min x 3   L 1 min x 3   To promote ankle strategy with balance.   Gait 100 ft CGA   Side stepping 30 ft to the R and 30 ft to the L CGA   Static standing forward lunge with contralateral UE assist   R 10x3  L 10x3   Performed to train body for forward weight shifting while strengthening glute and quad muscles.    Seated L hip flexion with manual assist 5x2 with 5 second holds with eccentric return     Improved technique, movement at target joints, use of target muscles after mod verbal, visual, tactile cues.     PATIENT EDUCATION:  Education details: POC Person educated: Patient Education method: Explanation Education comprehension: verbalized understanding  HOME EXERCISE PROGRAM: Access Code: [email or web address] URL: [email or web address] Date: 11/08/2023 Prepared by: Emil Glassman  Exercises - Seated Hip Flexion  - 1 x daily - 7 x weekly - 3 sets - 5 reps - Narrow Stance with Counter Support  - 3 x daily - 7 x weekly - 1 sets - 5 reps - 30 seconds hold - Standing Gastroc Stretch at Counter  - 3 x daily - 7 x weekly - 1 sets - 3 reps - 1 minute hold     ASSESSMENT:  CLINICAL IMPRESSION:  Pt demonstrates tendency for backwards weight shifting and LOB backwards. Continued working on forward weight shifting to help address. Better  able to bend at his ankles  today for static standing forward weight shifting exercise.  Also worked on L hip flexion and L quadriceps and glute strengthening to promote ability to support himself with his L LE as well as to promote L hip flexion to not trip on objects.  Pt tolerated session well without aggravation of symptoms. Pt will benefit from continued skilled physical therapy services to improve strength, balance and function.    OBJECTIVE IMPAIRMENTS: Abnormal gait, decreased balance, difficulty walking, decreased strength, improper body mechanics, and postural dysfunction.   ACTIVITY LIMITATIONS: carrying, lifting, bending, standing, squatting, stairs, transfers, toileting, dressing, and locomotion level  PARTICIPATION LIMITATIONS:   PERSONAL FACTORS: Age, Fitness, Past/current experiences, Time since onset of injury/illness/exacerbation, and 3+ comorbidities: Cancer, HTN, arthritis are also affecting patient's functional outcome.   REHAB POTENTIAL: Fair    CLINICAL DECISION MAKING: Evolving/moderate complexity; balance is worsening per pt.   EVALUATION COMPLEXITY: Moderate   GOALS: Goals reviewed with patient? Yes  SHORT TERM GOALS: Target date: 10/28/2023  Pt will be independent with his initial HEP to improve LE strength, balance, and function.  Baseline: Pt has not yet started his initial HEP (10/20/2023) Goal status: INITIAL   LONG TERM GOALS: Target date: 12/16/2023  Pt will improve his Berg Balance Test score to at least 48/56 as a demonstration of improved balance and decreased need for AD.  Baseline: 29/56 (10/20/2023) Goal status: INITIAL  2.  Pt will improve his B LE strength by at least 1/2 MMT grade to promote ability to perform transfers and standing tasks with less difficulty and with improved balance.  Baseline:  MMT Right eval Left eval  Hip flexion 4 3-  Hip extension (seated manually resisted) 4- 4-  Hip abduction (seated manually resisted) 4 4  Knee flexion 4+ 4  Knee  extension 5 4+   Goal status: INITIAL  3.  Pt will improve his Activities Specific Balance Confidence (ABC) scale by at least 15% as a demonstration of improved balance.  Baseline:  63.75% (10/20/2023) Goal status: INITIAL   PLAN:  PT FREQUENCY: 1-2x/week  PT DURATION: 8 weeks  PLANNED INTERVENTIONS: 97110-Therapeutic exercises, 97530- Therapeutic activity, [id number]- Neuromuscular re-education, 97535- Self Care, [id number]- Manual therapy, [phone]- Gait training, Patient/Family education, Balance training, and Stair training.  PLAN FOR NEXT SESSION: posture, trunk, hip and knee strengthening, balance training, manual techniques, modalities PRN.    Zoie Sarin, PT, DPT 11/08/2023, 12:30 PM

## 2023-11-08 NOTE — Patient Instructions (Signed)
 Please take the following dietary supplements for the duration of your hormone suppression therapy to reduce your risk for bone loss: -Calcium 1000-1200mg  daily -Vitamin D 800-1000IU daily

## 2023-11-08 NOTE — Progress Notes (Unsigned)
 11/08/2023 3:05 PM   [name] Oct 18, 1945 [id number]  CC: Chief Complaint  Patient presents with   Follow-up   Prostate Cancer   HPI: Leonard Stokes is a 78 y.o. male with metastatic prostate cancer, ED s/p IPP placement and revision, and BPH on Flomax  and finasteride  who presents today for follow-up.   He has been under the care of Drs. Babara and [facility] since his diagnosis with metastatic prostate cancer earlier this year.  He is now on Nubeqa  and ADT.  Today he reports feeling much better since starting dual pharmacotherapy.  He does have some lingering weakness and has been going to PT twice weekly.  He has hot flashes, but is not particularly bothered by these.  He is mostly bothered by nocturia x 4-5 as well as daytime frequency every hour.  Otherwise, he is very grateful for the care he has received by his care team.  Most recent PSA has come down significantly to 0.5, previously 66.9.  PVR 54 mL.  PMH: Past Medical History:  Diagnosis Date   Anemia    Arthritis    Atherosclerosis of aorta 2018   BPH (benign prostatic hyperplasia)    Cancer (HCC) 2019   prostate   Chronic kidney disease 08/2016   kidney stone   Diverticulosis of sigmoid colon    History of kidney stones    Hypertension    Leg cramps     Surgical History: Past Surgical History:  Procedure Laterality Date   APPENDECTOMY     EXTRACORPOREAL SHOCK WAVE LITHOTRIPSY Right 08/19/2016   Procedure: EXTRACORPOREAL SHOCK WAVE LITHOTRIPSY (ESWL);  Surgeon: Penne Knee, MD;  Location: [facility];  Service: Urology;  Laterality: Right;   EXTRACORPOREAL SHOCK WAVE LITHOTRIPSY Left 03/24/2017   Procedure: EXTRACORPOREAL SHOCK WAVE LITHOTRIPSY (ESWL);  Surgeon: Penne Knee, MD;  Location: [facility];  Service: Urology;  Laterality: Left;   EYE MUSCLE SURGERY     INGUINAL HERNIA REPAIR Right 12/23/2017   Direct hernia, medium Ultra Pro mesh;  Surgeon: Dessa Reyes ORN, MD;  Location: [facility];  Service:  General;  Laterality: Right;   JOINT REPLACEMENT     KNEE ARTHROPLASTY Left 11/15/2016   Procedure: COMPUTER ASSISTED TOTAL KNEE ARTHROPLASTY;  Surgeon: Mardee Lynwood SQUIBB, MD;  Location: [facility];  Service: Orthopedics;  Laterality: Left;   TOTAL KNEE ARTHROPLASTY Right     Home Medications:  Allergies as of 11/08/2023       Reactions   Codeine Rash        Medication List        Accurate as of November 08, 2023  3:05 PM. If you have any questions, ask your nurse or doctor.          STOP taking these medications    gabapentin  300 MG capsule Commonly known as: NEURONTIN  Stopped by: Lucie Hones       TAKE these medications    acetaminophen  500 MG tablet Commonly known as: TYLENOL  Take 1,000 mg by mouth every 6 (six) hours as needed for moderate pain.   atorvastatin 20 MG tablet Commonly known as: LIPITOR Take by mouth.   B COMPLEX-B12 PO Take 1 tablet by mouth daily.   CALCIUM ACETATE PO Take 2 tablets by mouth daily.   cyanocobalamin 1000 MCG tablet Take by mouth.   cyclobenzaprine  10 MG tablet Commonly known as: FLEXERIL  Take 1 tablet (10 mg total) by mouth 3 (three) times daily as needed for muscle spasms.   docusate sodium  100  MG capsule Commonly known as: Colace Take 1 capsule (100 mg total) by mouth 2 (two) times daily.   famotidine  20 MG tablet Commonly known as: PEPCID  Take 20 mg by mouth at bedtime.   ferrous sulfate  325 (65 FE) MG tablet Take 325 mg by mouth daily with breakfast.   finasteride  5 MG tablet Commonly known as: PROSCAR  TAKE ONE TABLET (5 MG) BY MOUTH EVERY OTHER DAY   Gemtesa 75 MG Tabs Generic drug: Vibegron Take 1 tablet (75 mg total) by mouth daily. Started by: Johngabriel Verde   hydrochlorothiazide  12.5 MG tablet Commonly known as: HYDRODIURIL    ipratropium 0.03 % nasal spray Commonly known as: ATROVENT SMARTSIG:1-2 Spray(s) Both Nares 3 Times Daily PRN   losartan -hydrochlorothiazide  50-12.5 MG  tablet Commonly known as: HYZAAR Take 1 tablet by mouth every morning.   meloxicam 15 MG tablet Commonly known as: MOBIC Take 1 tablet by mouth daily.   methocarbamol 500 MG tablet Commonly known as: ROBAXIN Take 500 mg by mouth.   NONFORMULARY OR COMPOUNDED ITEM Trimix (30/1/10)-(Pap/Phent/PGE)  Test Dose  1ml vial   Qty #3 Refills 0  [facility] [phone] Fax [phone]   Nubeqa  300 MG tablet Generic drug: darolutamide  Take 2 tablets (600 mg total) by mouth 2 (two) times daily with a meal.   OSTEO BI-FLEX TRIPLE STRENGTH PO Take 1 tablet by mouth daily.   oxyCODONE  5 MG immediate release tablet Commonly known as: Roxicodone  Take 1 tablet (5 mg total) by mouth every 8 (eight) hours as needed for severe pain (pain score 7-10) or breakthrough pain.   polyethylene glycol 17 g packet Commonly known as: MiraLax  Take 17 g by mouth daily.   SENIOR MULTIVITAMIN PLUS PO Take 1 tablet by mouth daily.   tamsulosin  0.4 MG Caps capsule Commonly known as: FLOMAX  TAKE ONE CAPSULE DAILY 30 MINUTES AFTER THE SAME MEAL EACH DAY   vitamin C  1000 MG tablet Take 1,000 mg by mouth daily.   Vitamin D-3 125 MCG (5000 UT) Tabs Take 5,000 Units by mouth daily.   vitamin E 180 MG (400 UNITS) capsule Take by mouth.   zolpidem  10 MG tablet Commonly known as: AMBIEN  Take 10 mg by mouth at bedtime as needed for sleep.        Allergies:  Allergies  Allergen Reactions   Codeine Rash    Family History: No family history on file.  Social History:   reports that he quit smoking about 27 years ago. His smoking use included cigarettes. He started smoking about 47 years ago. He has a 60 pack-year smoking history. He has never used smokeless tobacco. He reports current alcohol use. He reports that he does not use drugs.  Physical Exam: BP 128/79 (BP Location: Left Arm, Patient Position: Sitting, Cuff Size: Normal)   Pulse 87   Ht 5' 11 (1.803 m)   Wt 165 lb (74.8  kg)   SpO2 98%   BMI 23.01 kg/m   Constitutional:  Alert and oriented, no acute distress, nontoxic appearing HEENT: [place], AT Cardiovascular: No clubbing, cyanosis, or edema Respiratory: Normal respiratory effort, no increased work of breathing Skin: No rashes, bruises or suspicious lesions Neurologic: Grossly intact, no focal deficits, moving all 4 extremities Psychiatric: Normal mood and affect  Laboratory Data: Results for orders placed or performed in visit on 11/08/23  BLADDER SCAN AMB NON-IMAGING   Collection Time: 11/11/23 12:42 PM  Result Value Ref Range   Scan Result 54 ml   Assessment & Plan:  1. Prostate cancer (HCC) (Primary) Excellent therapeutic response on Nubeqa  and ADT.  I am very pleased with his progress.  He is tolerating ADT with minimal bother, may consider sertraline in the future to help with hot flashes.  2. Benign prostatic hyperplasia with urinary frequency He is emptying appropriately.  Will start Gemtesa.  Samples provided today. - BLADDER SCAN AMB NON-IMAGING - Vibegron (GEMTESA) 75 MG TABS; Take 1 tablet (75 mg total) by mouth daily.  Return in about 6 weeks (around 12/20/2023) for Symptom recheck with PVR.  Lucie Hones, PA-C  [facility] [place] [address] [place], [place] [address] [phone]

## 2023-11-09 ENCOUNTER — Other Ambulatory Visit: Payer: Self-pay | Admitting: Oncology

## 2023-11-09 ENCOUNTER — Other Ambulatory Visit: Payer: Self-pay

## 2023-11-09 DIAGNOSIS — C61 Malignant neoplasm of prostate: Secondary | ICD-10-CM

## 2023-11-09 MED ORDER — NUBEQA 300 MG PO TABS
600.0000 mg | ORAL_TABLET | Freq: Two times a day (BID) | ORAL | 1 refills | Status: DC
  Filled 2023-11-09 – 2023-11-11 (×2): qty 120, 30d supply, fill #0
  Filled 2023-12-07 – 2023-12-23 (×2): qty 120, 30d supply, fill #1

## 2023-11-10 ENCOUNTER — Inpatient Hospital Stay

## 2023-11-10 DIAGNOSIS — C61 Malignant neoplasm of prostate: Secondary | ICD-10-CM | POA: Diagnosis not present

## 2023-11-10 MED ORDER — LEUPROLIDE ACETATE (6 MONTH) 45 MG ~~LOC~~ KIT
45.0000 mg | PACK | Freq: Once | SUBCUTANEOUS | Status: AC
  Administered 2023-11-10: 45 mg via SUBCUTANEOUS
  Filled 2023-11-10: qty 45

## 2023-11-11 ENCOUNTER — Other Ambulatory Visit: Payer: Self-pay

## 2023-11-11 ENCOUNTER — Other Ambulatory Visit: Payer: Self-pay | Admitting: Pharmacy Technician

## 2023-11-11 LAB — BLADDER SCAN AMB NON-IMAGING: Scan Result: 54 mL

## 2023-11-11 NOTE — Progress Notes (Signed)
 Specialty Pharmacy Refill Coordination Note  Leonard Stokes is a 78 y.o. male contacted today regarding refills of specialty medication(s) Darolutamide  (Nubeqa )   Patient requested Delivery   Delivery date: 11/15/23   Verified address: [address] [place]   Medication will be filled on 11/14/23.

## 2023-11-11 NOTE — Progress Notes (Signed)
 Clinical Intervention Note  Clinical Intervention Notes: Patient reported starting leuprolide  infusions yesterday. No DDIs identified with Nubeqa    Clinical Intervention Outcomes: Prevention of an adverse drug event   Advertising account planner

## 2023-11-14 ENCOUNTER — Other Ambulatory Visit: Payer: Self-pay

## 2023-11-15 ENCOUNTER — Encounter: Payer: Self-pay | Admitting: Oncology

## 2023-11-15 ENCOUNTER — Ambulatory Visit

## 2023-11-15 DIAGNOSIS — M6281 Muscle weakness (generalized): Secondary | ICD-10-CM

## 2023-11-15 DIAGNOSIS — R2681 Unsteadiness on feet: Secondary | ICD-10-CM

## 2023-11-15 DIAGNOSIS — M25562 Pain in left knee: Secondary | ICD-10-CM | POA: Diagnosis not present

## 2023-11-15 DIAGNOSIS — Z9181 History of falling: Secondary | ICD-10-CM

## 2023-11-15 NOTE — Progress Notes (Signed)
 Patient declined [facility].  Patient reports he has the support he needs.

## 2023-11-15 NOTE — Therapy (Signed)
 OUTPATIENT PHYSICAL THERAPY TREATMENT   Patient Name: Leonard Stokes MRN: [id number] DOB:1945/05/02, 78 y.o., male Today's Date: 11/15/2023  END OF SESSION:  PT End of Session - 11/15/23 0951     Visit Number 5    Number of Visits 17    Date for Recertification  12/16/23    PT Start Time 0951    PT Stop Time 1029    PT Time Calculation (min) 38 min    Activity Tolerance Patient tolerated treatment well    Behavior During Therapy [facility] for tasks assessed/performed              Past Medical History:  Diagnosis Date   Anemia    Arthritis    Atherosclerosis of aorta 2018   BPH (benign prostatic hyperplasia)    Cancer (HCC) 2019   prostate   Chronic kidney disease 08/2016   kidney stone   Diverticulosis of sigmoid colon    History of kidney stones    Hypertension    Leg cramps    Past Surgical History:  Procedure Laterality Date   APPENDECTOMY     EXTRACORPOREAL SHOCK WAVE LITHOTRIPSY Right 08/19/2016   Procedure: EXTRACORPOREAL SHOCK WAVE LITHOTRIPSY (ESWL);  Surgeon: Penne Knee, MD;  Location: [facility];  Service: Urology;  Laterality: Right;   EXTRACORPOREAL SHOCK WAVE LITHOTRIPSY Left 03/24/2017   Procedure: EXTRACORPOREAL SHOCK WAVE LITHOTRIPSY (ESWL);  Surgeon: Penne Knee, MD;  Location: [facility];  Service: Urology;  Laterality: Left;   EYE MUSCLE SURGERY     INGUINAL HERNIA REPAIR Right 12/23/2017   Direct hernia, medium Ultra Pro mesh;  Surgeon: Dessa Reyes ORN, MD;  Location: [facility];  Service: General;  Laterality: Right;   JOINT REPLACEMENT     KNEE ARTHROPLASTY Left 11/15/2016   Procedure: COMPUTER ASSISTED TOTAL KNEE ARTHROPLASTY;  Surgeon: Mardee Lynwood SQUIBB, MD;  Location: [facility];  Service: Orthopedics;  Laterality: Left;   TOTAL KNEE ARTHROPLASTY Right    Patient Active Problem List   Diagnosis Date Noted   Mood swing 10/05/2023   Gait instability 10/05/2023   Encounter for monitoring androgen deprivation therapy 09/15/2023   Neoplasm  related pain 09/15/2023   Prostate cancer metastatic to multiple sites [facility]) 12/01/2017   Right inguinal hernia 04/15/2017   Hypertension 11/30/2016   S/P total knee arthroplasty 11/15/2016   Personal history of tobacco use, presenting hazards to health 09/26/2014    PCP: Diedra Lame, MD   REFERRING PROVIDER: Babara Call, MD   REFERRING DIAG: C61 (ICD-10-CM) - Prostate cancer metastatic to multiple sites (HCC) R53.1 (ICD-10-CM) - Weakness  Rationale for Evaluation and Treatment: Rehabilitation  THERAPY DIAG:  Unsteadiness on feet  Muscle weakness (generalized)  History of falling  ONSET DATE: 10/05/2023 (Date PT referral signed)  SUBJECTIVE:  SUBJECTIVE STATEMENT: Overall balance and walking is getting better but slow. Pt fell trying to get the power washer onto his R side. No pain currently.     PERTINENT HISTORY:  Weakness, decreased balance. Uneven surfaces causes him to lose balance. Also is unsteady when he first wakes up in the morning. Difficulty with balance started about 6-8 weeks ago which is worsening. Pt was cleaning up after his dog and lost balance going forward and could not get up. His PT neighbor helped him up. Had difficulty using his L LE to get himself up.    Blood pressure is controlled per pt.  No latex allergies.    No pain currently. Pain is primarily at night, around his abdominal area.   Standing up from a seated position such as a toilet, is very difficult, needs a grab bar assist.      PAIN:  Are you having pain? Yes: NPRS scale: none at the moment.  Pain location: abdominal area and some in the back, might be cancer related.  Pain description: achy Aggravating factors: unknown, does not have it all the time.  Relieving factors: oxycodone   PRECAUTIONS:  Fall and Other: CA  RED FLAGS: Bowel or bladder incontinence: No and Cauda equina syndrome: No   WEIGHT BEARING RESTRICTIONS: No  FALLS:  Has patient fallen in last 6 months? Yes. Number of falls 6, pt states catching himself most of the time. Did not really fall, he just loss balance. Uneven surfaces causes him to lose balance  LIVING ENVIRONMENT: Lives with: lives with their spouse Lives in: House/apartment Stairs: Yes: Internal: 14 steps; on right going up and External: 2 steps; on right going up Has following equipment at home: Single point cane, Walker - 4 wheeled, and Grab bars  OCCUPATION: retired  PLOF: Independent  PATIENT GOALS: getting up from the chair and toilet, and improve B LE strength L > R  NEXT MD VISIT: about 1-2 weeks from now.  OBJECTIVE:  Note: Objective measures were completed at Evaluation unless otherwise noted.  DIAGNOSTIC FINDINGS:    PATIENT SURVEYS:  ABC scale: The Activities-Specific Balance Confidence (ABC) Scale 0% 10 20 30  40 50 60 70 80 90 100% No confidence<->completely confident  "How confident are you that you will not lose your balance or become unsteady when you . . .   Date tested 10/20/2023  Walk around the house 50%  2. Walk up or down stairs 50%  3. Bend over and pick up a slipper from in front of a closet floor 0%  4. Reach for a small can off a shelf at eye level 100%  5. Stand on tip toes and reach for something above your head 50%  6. Stand on a chair and reach for something 30%  7. Sweep the floor 60%  8. Walk outside the house to a car parked in the driveway 100%  9. Get into or out of a car 100%  10. Walk across a parking lot to the mall 80%  11. Walk up or down a ramp 80%  12. Walk in a crowded mall where people rapidly walk past you 100%  13. Are bumped into by people as you walk through the mall 80%  14. Step onto or off of an escalator while you are holding onto the railing 100%  15. Step onto or off an escalator  while holding onto parcels such that you cannot hold onto the railing 40%  16. Walk outside on icy sidewalks  0%  Total: #/16 1020%/16 = 63.75%     COGNITION: Overall cognitive status: Within functional limits for tasks assessed     SENSATION:   MUSCLE LENGTH:   POSTURE: wide base of support, hip and knee flexion, forward flexed posture, R hip ER,   PALPATION:   LUMBAR ROM:   AROM eval  Flexion in sitting  full  Extension WFL (LOB backwards in standing)  Right lateral flexion  WFL  Left lateral flexion WFL  Right rotation in sitting WFL  Left rotation in sitting WFL   (Blank rows = not tested)  LOWER EXTREMITY ROM:     Passive  Right eval Left eval  Hip flexion    Hip extension    Hip abduction    Hip adduction    Hip internal rotation    Hip external rotation    Knee flexion    Knee extension    Ankle dorsiflexion    Ankle plantarflexion    Ankle inversion    Ankle eversion     (Blank rows = not tested)  LOWER EXTREMITY MMT:    MMT Right eval Left eval  Hip flexion 4 3-  Hip extension (seated manually resisted) 4- 4-  Hip abduction (seated manually resisted) 4 4  Hip adduction    Hip internal rotation    Hip external rotation    Knee flexion 4+ 4  Knee extension 5 4+  Ankle dorsiflexion    Ankle plantarflexion    Ankle inversion    Ankle eversion     (Blank rows = not tested)  LUMBAR SPECIAL TESTS:  (-) heel to shin test (+) finger to nose on L side.   Difficulty with rapid alternating palms up and down.   FUNCTIONAL TESTS:  Lars Balance Scale:  Item Test date: 10/20/2023 Date:  Date:   Sitting to standing 3. able to stand independently using hands Insert SmartPhrase OPRCBERGREEVAL Insert SmartPhrase OPRCBERGREEVAL  2. Standing unsupported 2. able to stand 30 seconds unsupported    3. Sitting with back unsupported, feet supported 4. able to sit safely and securely for 2 minutes    4. Standing to sitting 3. controls descent by using hands     5. Pivot transfer  3. able to transfer safely with definite need of hands    6. Standing unsupported with eyes closed 3. able to stand 10 seconds with supervision    7. Standing unsupported with feet together 3. able to place feet together independently and stand 1 minute with supervision    8. Reaching forward with outstretched arms while standing 2. can reach forward 5 cm (2 inches)    9. Pick up object from the floor from standing 3. able to pick up slipper but needs supervision    10. Turning to look behind over left and right shoulders while standing 1. needs supervision when turning    11. Turn 360 degrees 1. needs close supervision or verbal cuing    12. Place alternate foot on step or stool while standing unsupported 0. needs assistance to keep from falling/unable to try    13. Standing unsupported one foot in front 1. needs help to step but can hold 15 seconds    14. Standing on one leg 0. unable to try of needs assist to prevent fall      Total Score 29/56 Total Score:    Total Score:       Score Interpretation: Score of <19 indicates high risk of falls.  Minimally Clinically Important Difference (MCID):  =DGI scores of<21/24 = 1.80 points DGI scores of >21/24 = 0.60 points   Chenequa T, Inbar-Borovsky N, Brozgol M, Giladi N, [place] JM. The Dynamic Gait Index in healthy older adults: the role of stair climbing, fear of falling and gender. Gait Posture. 2009 Feb;29(2):237-41. doi: 10.1016/j.gaitpost.2008.08.013. Epub 2008 Oct 8. PMID: 81154560; PMCID: EFR7290501.  Pardasaney, MYRTIS LOIS Bonus, GEANNIE POUR., et al. (2012). Sensitivity to change and responsiveness of four balance measures for community-dwelling older adults. Physical therapy 92(3): 388-397.   GAIT: Distance walked: 30 ft Assistive device utilized: Single point cane Level of assistance: Modified independence and SBA Comments: SPC on R, decreased stance R LE, forward flexed, decreased gait velocity.   TREATMENT DATE:  11/15/2023                                                                                                                               Neuromuscular Re education Pt was recommended to continue using his [facility] for now for safety  Standing with B UE assist   gastroc stretch    R 1 min x 3   L 1 min x 3   To promote ankle strategy with balance.   Stepping over 2 mini hurdles  With one UE assist 10x   Then with one UE assist PRN 6x     Unsteady     Cues to place and maintain his center of gravity over his stance foot/base of support  SLS with contralateral light touch assist, emphasis on maintaining his center of gravity over his base of support (foot)  R 10x5 secodns  for 2 sets   L 10x5 seconds for 2 sets   Standing with one UE assist   Split squat   R 10x   L 10x         Improved technique, movement at target joints, use of target muscles after mod verbal, visual, tactile cues.     PATIENT EDUCATION:  Education details: POC Person educated: Patient Education method: Explanation Education comprehension: verbalized understanding  HOME EXERCISE PROGRAM: Access Code: [email or web address] URL: [email or web address] Date: 11/08/2023 Prepared by: Emil Glassman  Exercises - Seated Hip Flexion  - 1 x daily - 7 x weekly - 3 sets - 5 reps - Narrow Stance with Counter Support  - 3 x daily - 7 x weekly - 1 sets - 5 reps - 30 seconds hold - Standing Gastroc Stretch at Counter  - 3 x daily - 7 x weekly - 1 sets - 3 reps - 1 minute hold  - Standing Single Leg Stance with Counter Support  - 1 x daily - 7 x weekly - 3 sets - 10 reps - 5 seconds hold   ASSESSMENT:  CLINICAL IMPRESSION:  Worked on obstacle negotiation as well as placing and maintaining his center of gravity over his base of support to improve balance and decrease fall risk.  Difficulty performing aforementioned task.  Pt tolerated session well without aggravation of symptoms. Pt will benefit from continued  skilled physical therapy services to improve strength, balance and function.    OBJECTIVE IMPAIRMENTS: Abnormal gait, decreased balance, difficulty walking, decreased strength, improper body mechanics, and postural dysfunction.   ACTIVITY LIMITATIONS: carrying, lifting, bending, standing, squatting, stairs, transfers, toileting, dressing, and locomotion level  PARTICIPATION LIMITATIONS:   PERSONAL FACTORS: Age, Fitness, Past/current experiences, Time since onset of injury/illness/exacerbation, and 3+ comorbidities: Cancer, HTN, arthritis are also affecting patient's functional outcome.   REHAB POTENTIAL: Fair    CLINICAL DECISION MAKING: Evolving/moderate complexity; balance is worsening per pt.   EVALUATION COMPLEXITY: Moderate   GOALS: Goals reviewed with patient? Yes  SHORT TERM GOALS: Target date: 10/28/2023  Pt will be independent with his initial HEP to improve LE strength, balance, and function.  Baseline: Pt has not yet started his initial HEP (10/20/2023) Goal status: INITIAL   LONG TERM GOALS: Target date: 12/16/2023  Pt will improve his Berg Balance Test score to at least 48/56 as a demonstration of improved balance and decreased need for AD.  Baseline: 29/56 (10/20/2023) Goal status: INITIAL  2.  Pt will improve his B LE strength by at least 1/2 MMT grade to promote ability to perform transfers and standing tasks with less difficulty and with improved balance.  Baseline:  MMT Right eval Left eval  Hip flexion 4 3-  Hip extension (seated manually resisted) 4- 4-  Hip abduction (seated manually resisted) 4 4  Knee flexion 4+ 4  Knee extension 5 4+   Goal status: INITIAL  3.  Pt will improve his Activities Specific Balance Confidence (ABC) scale by at least 15% as a demonstration of improved balance.  Baseline:  63.75% (10/20/2023) Goal status: INITIAL   PLAN:  PT FREQUENCY: 1-2x/week  PT DURATION: 8 weeks  PLANNED INTERVENTIONS: 97110-Therapeutic  exercises, 97530- Therapeutic activity, [id number]- Neuromuscular re-education, 97535- Self Care, [id number]- Manual therapy, [phone]- Gait training, Patient/Family education, Balance training, and Stair training.  PLAN FOR NEXT SESSION: posture, trunk, hip and knee strengthening, balance training, manual techniques, modalities PRN.    Tavari Loadholt, PT, DPT 11/15/2023, 4:24 PM

## 2023-11-17 ENCOUNTER — Ambulatory Visit

## 2023-11-17 DIAGNOSIS — R2681 Unsteadiness on feet: Secondary | ICD-10-CM

## 2023-11-17 DIAGNOSIS — M6281 Muscle weakness (generalized): Secondary | ICD-10-CM

## 2023-11-17 DIAGNOSIS — Z9181 History of falling: Secondary | ICD-10-CM

## 2023-11-17 NOTE — Therapy (Signed)
 OUTPATIENT PHYSICAL THERAPY TREATMENT   Patient Name: Leonard Stokes MRN: [id number] DOB:1945-09-15, 78 y.o., male Today's Date: 11/17/2023  END OF SESSION:  PT End of Session - 11/17/23 0952     Visit Number 6    Number of Visits 17    Date for Recertification  12/16/23    PT Start Time 0952    PT Stop Time 1031    PT Time Calculation (min) 39 min    Activity Tolerance Patient tolerated treatment well    Behavior During Therapy [facility] for tasks assessed/performed               Past Medical History:  Diagnosis Date   Anemia    Arthritis    Atherosclerosis of aorta 2018   BPH (benign prostatic hyperplasia)    Cancer (HCC) 2019   prostate   Chronic kidney disease 08/2016   kidney stone   Diverticulosis of sigmoid colon    History of kidney stones    Hypertension    Leg cramps    Past Surgical History:  Procedure Laterality Date   APPENDECTOMY     EXTRACORPOREAL SHOCK WAVE LITHOTRIPSY Right 08/19/2016   Procedure: EXTRACORPOREAL SHOCK WAVE LITHOTRIPSY (ESWL);  Surgeon: Penne Knee, MD;  Location: [facility] ORS;  Service: Urology;  Laterality: Right;   EXTRACORPOREAL SHOCK WAVE LITHOTRIPSY Left 03/24/2017   Procedure: EXTRACORPOREAL SHOCK WAVE LITHOTRIPSY (ESWL);  Surgeon: Penne Knee, MD;  Location: [facility] OR[facility];  Service: Urology;  Laterality: Left;   EYE MUSCLE SURGERY     INGUINAL HERNIA REPAIR Right 12/23/2017   Direct hernia, medium Ultra Pro mesh;  Surgeon: Dessa Reyes ORN, MD;  Location: [facility] OR[facility];  Service: General;  Laterality: Right;   JOINT REPLACEMENT     KNEE ARTHROPLASTY Left 11/15/2016   Procedure: COMPUTER ASSISTED TOTAL KNEE ARTHROPLASTY;  Surgeon: Mardee Lynwood SQUIBB, MD;  Location: [facility] OR[facility];  Service: Orthopedics;  Laterality: Left;   TOTAL KNEE ARTHROPLASTY Right    Patient Active Problem List   Diagnosis Date Noted   Mood swing 10/05/2023   Gait instability 10/05/2023   Encounter for monitoring androgen deprivation therapy 09/15/2023    Neoplasm related pain 09/15/2023   Prostate cancer metastatic to multiple sites [facility]) 12/01/2017   Right inguinal hernia 04/15/2017   Hypertension 11/30/2016   S/P total knee arthroplasty 11/15/2016   Personal history of tobacco use, presenting hazards to health 09/26/2014    PCP: Diedra Lame, MD   REFERRING PROVIDER: Babara Call, MD   REFERRING DIAG: C61 (ICD-10-CM) - Prostate cancer metastatic to multiple sites (HCC) R53.1 (ICD-10-CM) - Weakness  Rationale for Evaluation and Treatment: Rehabilitation  THERAPY DIAG:  Unsteadiness on feet  Muscle weakness (generalized)  History of falling  ONSET DATE: 10/05/2023 (Date PT referral signed)  SUBJECTIVE:  SUBJECTIVE STATEMENT: Had B quadriceps soreness after last session. Better this morning.       PERTINENT HISTORY:  Weakness, decreased balance. Uneven surfaces causes him to lose balance. Also is unsteady when he first wakes up in the morning. Difficulty with balance started about 6-8 weeks ago which is worsening. Pt was cleaning up after his dog and lost balance going forward and could not get up. His PT neighbor helped him up. Had difficulty using his L LE to get himself up.    Blood pressure is controlled per pt.  No latex allergies.    No pain currently. Pain is primarily at night, around his abdominal area.   Standing up from a seated position such as a toilet, is very difficult, needs a grab bar assist.      PAIN:  Are you having pain? Yes: NPRS scale: none at the moment.  Pain location: abdominal area and some in the back, might be cancer related.  Pain description: achy Aggravating factors: unknown, does not have it all the time.  Relieving factors: oxycodone   PRECAUTIONS: Fall and Other: CA  RED FLAGS: Bowel or bladder  incontinence: No and Cauda equina syndrome: No   WEIGHT BEARING RESTRICTIONS: No  FALLS:  Has patient fallen in last 6 months? Yes. Number of falls 6, pt states catching himself most of the time. Did not really fall, he just loss balance. Uneven surfaces causes him to lose balance  LIVING ENVIRONMENT: Lives with: lives with their spouse Lives in: House/apartment Stairs: Yes: Internal: 14 steps; on right going up and External: 2 steps; on right going up Has following equipment at home: Single point cane, Walker - 4 wheeled, and Grab bars  OCCUPATION: retired  PLOF: Independent  PATIENT GOALS: getting up from the chair and toilet, and improve B LE strength L > R  NEXT MD VISIT: about 1-2 weeks from now.  OBJECTIVE:  Note: Objective measures were completed at Evaluation unless otherwise noted.  DIAGNOSTIC FINDINGS:    PATIENT SURVEYS:  ABC scale: The Activities-Specific Balance Confidence (ABC) Scale 0% 10 20 30  40 50 60 70 80 90 100% No confidence<->completely confident  "How confident are you that you will not lose your balance or become unsteady when you . . .   Date tested 10/20/2023  Walk around the house 50%  2. Walk up or down stairs 50%  3. Bend over and pick up a slipper from in front of a closet floor 0%  4. Reach for a small can off a shelf at eye level 100%  5. Stand on tip toes and reach for something above your head 50%  6. Stand on a chair and reach for something 30%  7. Sweep the floor 60%  8. Walk outside the house to a car parked in the driveway 100%  9. Get into or out of a car 100%  10. Walk across a parking lot to the mall 80%  11. Walk up or down a ramp 80%  12. Walk in a crowded mall where people rapidly walk past you 100%  13. Are bumped into by people as you walk through the mall 80%  14. Step onto or off of an escalator while you are holding onto the railing 100%  15. Step onto or off an escalator while holding onto parcels such that you cannot  hold onto the railing 40%  16. Walk outside on icy sidewalks 0%  Total: #/16 1020%/16 = 63.75%     COGNITION:  Overall cognitive status: Within functional limits for tasks assessed     SENSATION:   MUSCLE LENGTH:   POSTURE: wide base of support, hip and knee flexion, forward flexed posture, R hip ER,   PALPATION:   LUMBAR ROM:   AROM eval  Flexion in sitting  full  Extension WFL (LOB backwards in standing)  Right lateral flexion  WFL  Left lateral flexion WFL  Right rotation in sitting WFL  Left rotation in sitting WFL   (Blank rows = not tested)  LOWER EXTREMITY ROM:     Passive  Right eval Left eval  Hip flexion    Hip extension    Hip abduction    Hip adduction    Hip internal rotation    Hip external rotation    Knee flexion    Knee extension    Ankle dorsiflexion    Ankle plantarflexion    Ankle inversion    Ankle eversion     (Blank rows = not tested)  LOWER EXTREMITY MMT:    MMT Right eval Left eval  Hip flexion 4 3-  Hip extension (seated manually resisted) 4- 4-  Hip abduction (seated manually resisted) 4 4  Hip adduction    Hip internal rotation    Hip external rotation    Knee flexion 4+ 4  Knee extension 5 4+  Ankle dorsiflexion    Ankle plantarflexion    Ankle inversion    Ankle eversion     (Blank rows = not tested)  LUMBAR SPECIAL TESTS:  (-) heel to shin test (+) finger to nose on L side.   Difficulty with rapid alternating palms up and down.   FUNCTIONAL TESTS:  Lars Balance Scale:  Item Test date: 10/20/2023 Date:  Date:   Sitting to standing 3. able to stand independently using hands Insert SmartPhrase OPRCBERGREEVAL Insert SmartPhrase OPRCBERGREEVAL  2. Standing unsupported 2. able to stand 30 seconds unsupported    3. Sitting with back unsupported, feet supported 4. able to sit safely and securely for 2 minutes    4. Standing to sitting 3. controls descent by using hands    5. Pivot transfer  3. able to transfer  safely with definite need of hands    6. Standing unsupported with eyes closed 3. able to stand 10 seconds with supervision    7. Standing unsupported with feet together 3. able to place feet together independently and stand 1 minute with supervision    8. Reaching forward with outstretched arms while standing 2. can reach forward 5 cm (2 inches)    9. Pick up object from the floor from standing 3. able to pick up slipper but needs supervision    10. Turning to look behind over left and right shoulders while standing 1. needs supervision when turning    11. Turn 360 degrees 1. needs close supervision or verbal cuing    12. Place alternate foot on step or stool while standing unsupported 0. needs assistance to keep from falling/unable to try    13. Standing unsupported one foot in front 1. needs help to step but can hold 15 seconds    14. Standing on one leg 0. unable to try of needs assist to prevent fall      Total Score 29/56 Total Score:    Total Score:       Score Interpretation: Score of <19 indicates high risk of falls.  Minimally Clinically Important Difference (MCID):  =DGI scores of<21/24 = 1.80 points  DGI scores of >21/24 = 0.60 points   [place] T, Inbar-Borovsky N, Brozgol M, Giladi N, [place] JM. The Dynamic Gait Index in healthy older adults: the role of stair climbing, fear of falling and gender. Gait Posture. 2009 Feb;29(2):237-41. doi: 10.1016/j.gaitpost.2008.08.013. Epub 2008 Oct 8. PMID: 81154560; PMCID: EFR7290501.  Pardasaney, MYRTIS LOIS Bonus, GEANNIE POUR., et al. (2012). Sensitivity to change and responsiveness of four balance measures for community-dwelling older adults. Physical therapy 92(3): 388-397.   GAIT: Distance walked: 30 ft Assistive device utilized: Single point cane Level of assistance: Modified independence and SBA Comments: SPC on R, decreased stance R LE, forward flexed, decreased gait velocity.   TREATMENT DATE: 11/17/2023                                                                                                                                Therapeutic exercises  Standing with B UE assist   gastroc stretch    R 1 min x 2   L 1 min x 2   To promote ankle strategy with balance.   Bent over onto low mat table  Glute max extension    R 10x3   L 10x3  Standing with B UE assist   Hip abduction    R 10x3   L 10x3  Standing heel toe raises with B UE assist 1x each direction to promote ankle strength with balance. Difficulty performing  Seated ankle PF blue band  R 10x3  L 10x3    Improved exercise technique, movement at target joints, use of target muscles after mod verbal, visual, tactile cues.     Neuromuscular Re education  Pt was recommended to continue using his [facility] for now for safety    SLS with contralateral UE assist, emphasis on maintaining his center of gravity over his base of support (foot)  R 10x5 secodns  for 2 sets   L 10x5 seconds for 2 sets   Standing marches without UE assist 10x each LE, emphasis on maintaining his center of gravity over his base of support (foot)  Difficulty performing.    Improved technique, movement at target joints, use of target muscles after mod verbal, visual, tactile cues.     PATIENT EDUCATION:  Education details: POC Person educated: Patient Education method: Explanation Education comprehension: verbalized understanding  HOME EXERCISE PROGRAM: Access Code: [email or web address] URL: [email or web address] Date: 11/08/2023 Prepared by: Emil Glassman  Exercises - Seated Hip Flexion  - 1 x daily - 7 x weekly - 3 sets - 5 reps - Narrow Stance with Counter Support  - 3 x daily - 7 x weekly - 1 sets - 5 reps - 30 seconds hold - Standing Gastroc Stretch at Counter  - 3 x daily - 7 x weekly - 1 sets - 3 reps - 1 minute hold  - Standing Single Leg Stance with Counter Support  - 1 x daily - 7 x weekly - 3 sets -  10 reps - 5 seconds hold  - Seated Ankle Plantar  Flexion with Resistance Loop  - 1 x daily - 7 x weekly - 3 sets - 10 reps  Blue band.    ASSESSMENT:  CLINICAL IMPRESSION:  Worked on LE strengthening to promote ability support himself with stance LE when perform standing tasks and obstacle negotiation. Good muscle use observed with exercises. Pt tolerated session well without aggravation of symptoms. Pt will benefit from continued skilled physical therapy services to improve strength, balance and function.    OBJECTIVE IMPAIRMENTS: Abnormal gait, decreased balance, difficulty walking, decreased strength, improper body mechanics, and postural dysfunction.   ACTIVITY LIMITATIONS: carrying, lifting, bending, standing, squatting, stairs, transfers, toileting, dressing, and locomotion level  PARTICIPATION LIMITATIONS:   PERSONAL FACTORS: Age, Fitness, Past/current experiences, Time since onset of injury/illness/exacerbation, and 3+ comorbidities: Cancer, HTN, arthritis are also affecting patient's functional outcome.   REHAB POTENTIAL: Fair    CLINICAL DECISION MAKING: Evolving/moderate complexity; balance is worsening per pt.   EVALUATION COMPLEXITY: Moderate   GOALS: Goals reviewed with patient? Yes  SHORT TERM GOALS: Target date: 10/28/2023  Pt will be independent with his initial HEP to improve LE strength, balance, and function.  Baseline: Pt has not yet started his initial HEP (10/20/2023) Goal status: INITIAL   LONG TERM GOALS: Target date: 12/16/2023  Pt will improve his Berg Balance Test score to at least 48/56 as a demonstration of improved balance and decreased need for AD.  Baseline: 29/56 (10/20/2023) Goal status: INITIAL  2.  Pt will improve his B LE strength by at least 1/2 MMT grade to promote ability to perform transfers and standing tasks with less difficulty and with improved balance.  Baseline:  MMT Right eval Left eval  Hip flexion 4 3-  Hip extension (seated manually resisted) 4- 4-  Hip abduction  (seated manually resisted) 4 4  Knee flexion 4+ 4  Knee extension 5 4+   Goal status: INITIAL  3.  Pt will improve his Activities Specific Balance Confidence (ABC) scale by at least 15% as a demonstration of improved balance.  Baseline:  63.75% (10/20/2023) Goal status: INITIAL   PLAN:  PT FREQUENCY: 1-2x/week  PT DURATION: 8 weeks  PLANNED INTERVENTIONS: 97110-Therapeutic exercises, 97530- Therapeutic activity, [id number]- Neuromuscular re-education, 97535- Self Care, [id number]- Manual therapy, [phone]- Gait training, Patient/Family education, Balance training, and Stair training.  PLAN FOR NEXT SESSION: posture, trunk, hip and knee strengthening, balance training, manual techniques, modalities PRN.    Tekisha Darcey, PT, DPT 11/17/2023, 10:33 AM

## 2023-11-22 ENCOUNTER — Ambulatory Visit

## 2023-11-22 DIAGNOSIS — R2681 Unsteadiness on feet: Secondary | ICD-10-CM

## 2023-11-22 DIAGNOSIS — M6281 Muscle weakness (generalized): Secondary | ICD-10-CM

## 2023-11-22 DIAGNOSIS — Z9181 History of falling: Secondary | ICD-10-CM

## 2023-11-22 NOTE — Therapy (Signed)
 OUTPATIENT PHYSICAL THERAPY TREATMENT   Patient Name: Leonard Stokes MRN: [id number] DOB:1945-07-26, 78 y.o., male Today's Date: 11/22/2023  END OF SESSION:  PT End of Session - 11/22/23 1035     Visit Number 7    Number of Visits 17    Date for Recertification  12/16/23    PT Start Time 1035    PT Stop Time 1115    PT Time Calculation (min) 40 min    Activity Tolerance Patient tolerated treatment well    Behavior During Therapy [facility] for tasks assessed/performed                Past Medical History:  Diagnosis Date   Anemia    Arthritis    Atherosclerosis of aorta 2018   BPH (benign prostatic hyperplasia)    Cancer (HCC) 2019   prostate   Chronic kidney disease 08/2016   kidney stone   Diverticulosis of sigmoid colon    History of kidney stones    Hypertension    Leg cramps    Past Surgical History:  Procedure Laterality Date   APPENDECTOMY     EXTRACORPOREAL SHOCK WAVE LITHOTRIPSY Right 08/19/2016   Procedure: EXTRACORPOREAL SHOCK WAVE LITHOTRIPSY (ESWL);  Surgeon: Penne Knee, MD;  Location: [facility] ORS;  Service: Urology;  Laterality: Right;   EXTRACORPOREAL SHOCK WAVE LITHOTRIPSY Left 03/24/2017   Procedure: EXTRACORPOREAL SHOCK WAVE LITHOTRIPSY (ESWL);  Surgeon: Penne Knee, MD;  Location: [facility] OR[facility];  Service: Urology;  Laterality: Left;   EYE MUSCLE SURGERY     INGUINAL HERNIA REPAIR Right 12/23/2017   Direct hernia, medium Ultra Pro mesh;  Surgeon: Dessa Reyes ORN, MD;  Location: [facility] OR[facility];  Service: General;  Laterality: Right;   JOINT REPLACEMENT     KNEE ARTHROPLASTY Left 11/15/2016   Procedure: COMPUTER ASSISTED TOTAL KNEE ARTHROPLASTY;  Surgeon: Mardee Lynwood SQUIBB, MD;  Location: [facility] OR[facility];  Service: Orthopedics;  Laterality: Left;   TOTAL KNEE ARTHROPLASTY Right    Patient Active Problem List   Diagnosis Date Noted   Mood swing 10/05/2023   Gait instability 10/05/2023   Encounter for monitoring androgen deprivation therapy 09/15/2023    Neoplasm related pain 09/15/2023   Prostate cancer metastatic to multiple sites [facility]) 12/01/2017   Right inguinal hernia 04/15/2017   Hypertension 11/30/2016   S/P total knee arthroplasty 11/15/2016   Personal history of tobacco use, presenting hazards to health 09/26/2014    PCP: Diedra Lame, MD   REFERRING PROVIDER: Babara Call, MD   REFERRING DIAG: C61 (ICD-10-CM) - Prostate cancer metastatic to multiple sites (HCC) R53.1 (ICD-10-CM) - Weakness  Rationale for Evaluation and Treatment: Rehabilitation  THERAPY DIAG:  Unsteadiness on feet  Muscle weakness (generalized)  History of falling  ONSET DATE: 10/05/2023 (Date PT referral signed)  SUBJECTIVE:  SUBJECTIVE STATEMENT: Has been doing his stretches and ankle band exercise. Has a fear of stepping onto and off a curb while walking his dog secondary to falling before doing that activity.      PERTINENT HISTORY:  Weakness, decreased balance. Uneven surfaces causes him to lose balance. Also is unsteady when he first wakes up in the morning. Difficulty with balance started about 6-8 weeks ago which is worsening. Pt was cleaning up after his dog and lost balance going forward and could not get up. His PT neighbor helped him up. Had difficulty using his L LE to get himself up.    Blood pressure is controlled per pt.  No latex allergies.    No pain currently. Pain is primarily at night, around his abdominal area.   Standing up from a seated position such as a toilet, is very difficult, needs a grab bar assist.      PAIN:  Are you having pain? Yes: NPRS scale: none at the moment.  Pain location: abdominal area and some in the back, might be cancer related.  Pain description: achy Aggravating factors: unknown, does not have it all the time.   Relieving factors: oxycodone   PRECAUTIONS: Fall and Other: CA  RED FLAGS: Bowel or bladder incontinence: No and Cauda equina syndrome: No   WEIGHT BEARING RESTRICTIONS: No  FALLS:  Has patient fallen in last 6 months? Yes. Number of falls 6, pt states catching himself most of the time. Did not really fall, he just loss balance. Uneven surfaces causes him to lose balance  LIVING ENVIRONMENT: Lives with: lives with their spouse Lives in: House/apartment Stairs: Yes: Internal: 14 steps; on right going up and External: 2 steps; on right going up Has following equipment at home: Single point cane, Walker - 4 wheeled, and Grab bars  OCCUPATION: retired  PLOF: Independent  PATIENT GOALS: getting up from the chair and toilet, and improve B LE strength L > R  NEXT MD VISIT: about 1-2 weeks from now.  OBJECTIVE:  Note: Objective measures were completed at Evaluation unless otherwise noted.  DIAGNOSTIC FINDINGS:    PATIENT SURVEYS:  ABC scale: The Activities-Specific Balance Confidence (ABC) Scale 0% 10 20 30  40 50 60 70 80 90 100% No confidence<->completely confident  "How confident are you that you will not lose your balance or become unsteady when you . . .   Date tested 10/20/2023  Walk around the house 50%  2. Walk up or down stairs 50%  3. Bend over and pick up a slipper from in front of a closet floor 0%  4. Reach for a small can off a shelf at eye level 100%  5. Stand on tip toes and reach for something above your head 50%  6. Stand on a chair and reach for something 30%  7. Sweep the floor 60%  8. Walk outside the house to a car parked in the driveway 100%  9. Get into or out of a car 100%  10. Walk across a parking lot to the mall 80%  11. Walk up or down a ramp 80%  12. Walk in a crowded mall where people rapidly walk past you 100%  13. Are bumped into by people as you walk through the mall 80%  14. Step onto or off of an escalator while you are holding onto the  railing 100%  15. Step onto or off an escalator while holding onto parcels such that you cannot hold onto the railing 40%  16. Walk outside on icy sidewalks 0%  Total: #/16 1020%/16 = 63.75%     COGNITION: Overall cognitive status: Within functional limits for tasks assessed     SENSATION:   MUSCLE LENGTH:   POSTURE: wide base of support, hip and knee flexion, forward flexed posture, R hip ER,   PALPATION:   LUMBAR ROM:   AROM eval  Flexion in sitting  full  Extension WFL (LOB backwards in standing)  Right lateral flexion  WFL  Left lateral flexion WFL  Right rotation in sitting WFL  Left rotation in sitting WFL   (Blank rows = not tested)  LOWER EXTREMITY ROM:     Passive  Right eval Left eval  Hip flexion    Hip extension    Hip abduction    Hip adduction    Hip internal rotation    Hip external rotation    Knee flexion    Knee extension    Ankle dorsiflexion    Ankle plantarflexion    Ankle inversion    Ankle eversion     (Blank rows = not tested)  LOWER EXTREMITY MMT:    MMT Right eval Left eval  Hip flexion 4 3-  Hip extension (seated manually resisted) 4- 4-  Hip abduction (seated manually resisted) 4 4  Hip adduction    Hip internal rotation    Hip external rotation    Knee flexion 4+ 4  Knee extension 5 4+  Ankle dorsiflexion    Ankle plantarflexion    Ankle inversion    Ankle eversion     (Blank rows = not tested)  LUMBAR SPECIAL TESTS:  (-) heel to shin test (+) finger to nose on L side.   Difficulty with rapid alternating palms up and down.   FUNCTIONAL TESTS:  Lars Balance Scale:  Item Test date: 10/20/2023 Date:  Date:   Sitting to standing 3. able to stand independently using hands Insert SmartPhrase OPRCBERGREEVAL Insert SmartPhrase OPRCBERGREEVAL  2. Standing unsupported 2. able to stand 30 seconds unsupported    3. Sitting with back unsupported, feet supported 4. able to sit safely and securely for 2 minutes    4.  Standing to sitting 3. controls descent by using hands    5. Pivot transfer  3. able to transfer safely with definite need of hands    6. Standing unsupported with eyes closed 3. able to stand 10 seconds with supervision    7. Standing unsupported with feet together 3. able to place feet together independently and stand 1 minute with supervision    8. Reaching forward with outstretched arms while standing 2. can reach forward 5 cm (2 inches)    9. Pick up object from the floor from standing 3. able to pick up slipper but needs supervision    10. Turning to look behind over left and right shoulders while standing 1. needs supervision when turning    11. Turn 360 degrees 1. needs close supervision or verbal cuing    12. Place alternate foot on step or stool while standing unsupported 0. needs assistance to keep from falling/unable to try    13. Standing unsupported one foot in front 1. needs help to step but can hold 15 seconds    14. Standing on one leg 0. unable to try of needs assist to prevent fall      Total Score 29/56 Total Score:    Total Score:       Score Interpretation: Score of <19  indicates high risk of falls.  Minimally Clinically Important Difference (MCID):  =DGI scores of<21/24 = 1.80 points DGI scores of >21/24 = 0.60 points   [place] T, Inbar-Borovsky N, Brozgol M, Giladi N, [place] JM. The Dynamic Gait Index in healthy older adults: the role of stair climbing, fear of falling and gender. Gait Posture. 2009 Feb;29(2):237-41. doi: 10.1016/j.gaitpost.2008.08.013. Epub 2008 Oct 8. PMID: 81154560; PMCID: EFR7290501.  Pardasaney, MYRTIS LOIS Bonus, GEANNIE POUR., et al. (2012). Sensitivity to change and responsiveness of four balance measures for community-dwelling older adults. Physical therapy 92(3): 388-397.   GAIT: Distance walked: 30 ft Assistive device utilized: Single point cane Level of assistance: Modified independence and SBA Comments: SPC on R, decreased stance R LE,  forward flexed, decreased gait velocity.   TREATMENT DATE: 11/22/2023                                                                                                                               Therapeutic exercises  Standing with B UE assist   gastroc stretch    R 1 min x 2   L 1 min x 2   To promote ankle strategy with balance.   Standing mini heel raises with B UE assist 10x2  Standing with B UE assist   Hip abduction    R 10x3   L 10x3   Improved exercise technique, movement at target joints, use of target muscles after mod verbal, visual, tactile cues.    Neuromuscular re education  Stepping over 4 mini hurdles with one UE assist 10x2  Forward step up onto and over 4 inch step to simulate curb with one UE assist   L 10x2  R 10x2   Verbal, visual, tactile cues to weight shift onto base of support  Standing toe taps onto cone with light touch assist CGA to min A  R 10x3  L 10x3   Tiring per pt   SLS with contralateral UE assist, emphasis on maintaining his center of gravity over his base of support (foot)  R 10x5 seconds   L 10x5 seconds    Improved technique, movement at target joints, use of target muscles after mod verbal, visual, tactile cues.       PATIENT EDUCATION:  Education details: POC Person educated: Patient Education method: Explanation Education comprehension: verbalized understanding  HOME EXERCISE PROGRAM: Access Code: [email or web address] URL: [email or web address] Date: 11/08/2023 Prepared by: Emil Glassman  Exercises - Seated Hip Flexion  - 1 x daily - 7 x weekly - 3 sets - 5 reps - Narrow Stance with Counter Support  - 3 x daily - 7 x weekly - 1 sets - 5 reps - 30 seconds hold - Standing Gastroc Stretch at Counter  - 3 x daily - 7 x weekly - 1 sets - 3 reps - 1 minute hold  - Standing Single Leg Stance with Counter Support  - 1 x daily - 7 x weekly - 3  sets - 10 reps - 5 seconds hold  - Seated Ankle Plantar Flexion with  Resistance Loop  - 1 x daily - 7 x weekly - 3 sets - 10 reps  Blue band.    ASSESSMENT:  CLINICAL IMPRESSION:  Continued with LE strengthening as well as improving ability to place and maintain his center of gravity over his base on support to promote ability to ambulate and negotiate obstacles with less difficulty and more safely. Good muscle use observed with exercises. Pt tolerated session well without aggravation of symptoms. Pt will benefit from continued skilled physical therapy services to improve strength, balance and function.    OBJECTIVE IMPAIRMENTS: Abnormal gait, decreased balance, difficulty walking, decreased strength, improper body mechanics, and postural dysfunction.   ACTIVITY LIMITATIONS: carrying, lifting, bending, standing, squatting, stairs, transfers, toileting, dressing, and locomotion level  PARTICIPATION LIMITATIONS:   PERSONAL FACTORS: Age, Fitness, Past/current experiences, Time since onset of injury/illness/exacerbation, and 3+ comorbidities: Cancer, HTN, arthritis are also affecting patient's functional outcome.   REHAB POTENTIAL: Fair    CLINICAL DECISION MAKING: Evolving/moderate complexity; balance is worsening per pt.   EVALUATION COMPLEXITY: Moderate   GOALS: Goals reviewed with patient? Yes  SHORT TERM GOALS: Target date: 10/28/2023  Pt will be independent with his initial HEP to improve LE strength, balance, and function.  Baseline: Pt has not yet started his initial HEP (10/20/2023) Goal status: INITIAL   LONG TERM GOALS: Target date: 12/16/2023  Pt will improve his Berg Balance Test score to at least 48/56 as a demonstration of improved balance and decreased need for AD.  Baseline: 29/56 (10/20/2023) Goal status: INITIAL  2.  Pt will improve his B LE strength by at least 1/2 MMT grade to promote ability to perform transfers and standing tasks with less difficulty and with improved balance.  Baseline:  MMT Right eval Left eval  Hip  flexion 4 3-  Hip extension (seated manually resisted) 4- 4-  Hip abduction (seated manually resisted) 4 4  Knee flexion 4+ 4  Knee extension 5 4+   Goal status: INITIAL  3.  Pt will improve his Activities Specific Balance Confidence (ABC) scale by at least 15% as a demonstration of improved balance.  Baseline:  63.75% (10/20/2023) Goal status: INITIAL   PLAN:  PT FREQUENCY: 1-2x/week  PT DURATION: 8 weeks  PLANNED INTERVENTIONS: 97110-Therapeutic exercises, 97530- Therapeutic activity, [id number]- Neuromuscular re-education, 97535- Self Care, [id number]- Manual therapy, [phone]- Gait training, Patient/Family education, Balance training, and Stair training.  PLAN FOR NEXT SESSION: posture, trunk, hip and knee strengthening, balance training, manual techniques, modalities PRN.    Lachrista Heslin, PT, DPT 11/22/2023, 11:27 AM

## 2023-11-23 ENCOUNTER — Ambulatory Visit

## 2023-11-23 DIAGNOSIS — R2681 Unsteadiness on feet: Secondary | ICD-10-CM | POA: Diagnosis not present

## 2023-11-23 DIAGNOSIS — M6281 Muscle weakness (generalized): Secondary | ICD-10-CM

## 2023-11-23 DIAGNOSIS — Z9181 History of falling: Secondary | ICD-10-CM

## 2023-11-23 NOTE — Therapy (Signed)
 OUTPATIENT PHYSICAL THERAPY TREATMENT   Patient Name: Leonard Stokes MRN: [id number] DOB:Aug 14, 1945, 78 y.o., male Today's Date: 11/23/2023  END OF SESSION:  PT End of Session - 11/23/23 1347     Visit Number 8    Number of Visits 17    Date for Recertification  12/16/23    PT Start Time 1347    PT Stop Time 1426    PT Time Calculation (min) 39 min    Activity Tolerance Patient tolerated treatment well    Behavior During Therapy [facility] for tasks assessed/performed                 Past Medical History:  Diagnosis Date   Anemia    Arthritis    Atherosclerosis of aorta 2018   BPH (benign prostatic hyperplasia)    Cancer (HCC) 2019   prostate   Chronic kidney disease 08/2016   kidney stone   Diverticulosis of sigmoid colon    History of kidney stones    Hypertension    Leg cramps    Past Surgical History:  Procedure Laterality Date   APPENDECTOMY     EXTRACORPOREAL SHOCK WAVE LITHOTRIPSY Right 08/19/2016   Procedure: EXTRACORPOREAL SHOCK WAVE LITHOTRIPSY (ESWL);  Surgeon: Penne Knee, MD;  Location: [facility] ORS;  Service: Urology;  Laterality: Right;   EXTRACORPOREAL SHOCK WAVE LITHOTRIPSY Left 03/24/2017   Procedure: EXTRACORPOREAL SHOCK WAVE LITHOTRIPSY (ESWL);  Surgeon: Penne Knee, MD;  Location: [facility] ORS;  Service: Urology;  Laterality: Left;   EYE MUSCLE SURGERY     INGUINAL HERNIA REPAIR Right 12/23/2017   Direct hernia, medium Ultra Pro mesh;  Surgeon: Dessa Reyes ORN, MD;  Location: [facility] OR[facility];  Service: General;  Laterality: Right;   JOINT REPLACEMENT     KNEE ARTHROPLASTY Left 11/15/2016   Procedure: COMPUTER ASSISTED TOTAL KNEE ARTHROPLASTY;  Surgeon: Mardee Lynwood SQUIBB, MD;  Location: [facility] OR[facility];  Service: Orthopedics;  Laterality: Left;   TOTAL KNEE ARTHROPLASTY Right    Patient Active Problem List   Diagnosis Date Noted   Mood swing 10/05/2023   Gait instability 10/05/2023   Encounter for monitoring androgen deprivation therapy 09/15/2023    Neoplasm related pain 09/15/2023   Prostate cancer metastatic to multiple sites [facility] [place]) 12/01/2017   Right inguinal hernia 04/15/2017   Hypertension 11/30/2016   S/P total knee arthroplasty 11/15/2016   Personal history of tobacco use, presenting hazards to health 09/26/2014    PCP: Diedra Lame, MD   REFERRING PROVIDER: Babara Call, MD   REFERRING DIAG: C61 (ICD-10-CM) - Prostate cancer metastatic to multiple sites (HCC) R53.1 (ICD-10-CM) - Weakness  Rationale for Evaluation and Treatment: Rehabilitation  THERAPY DIAG:  Unsteadiness on feet  Muscle weakness (generalized)  History of falling  ONSET DATE: 10/05/2023 (Date PT referral signed)  SUBJECTIVE:  SUBJECTIVE STATEMENT: Sore today. Ankles gave way yesterday while at [employer]. Did not fall.   Doing better (with balance) compared to the first day of PT.      PERTINENT HISTORY:  Weakness, decreased balance. Uneven surfaces causes him to lose balance. Also is unsteady when he first wakes up in the morning. Difficulty with balance started about 6-8 weeks ago which is worsening. Pt was cleaning up after his dog and lost balance going forward and could not get up. His PT neighbor helped him up. Had difficulty using his L LE to get himself up.    Blood pressure is controlled per pt.  No latex allergies.    No pain currently. Pain is primarily at night, around his abdominal area.   Standing up from a seated position such as a toilet, is very difficult, needs a grab bar assist.      PAIN:  Are you having pain? Yes: NPRS scale: none at the moment.  Pain location: abdominal area and some in the back, might be cancer related.  Pain description: achy Aggravating factors: unknown, does not have it all the time.  Relieving factors:  oxycodone   PRECAUTIONS: Fall and Other: CA  RED FLAGS: Bowel or bladder incontinence: No and Cauda equina syndrome: No   WEIGHT BEARING RESTRICTIONS: No  FALLS:  Has patient fallen in last 6 months? Yes. Number of falls 6, pt states catching himself most of the time. Did not really fall, he just loss balance. Uneven surfaces causes him to lose balance  LIVING ENVIRONMENT: Lives with: lives with their spouse Lives in: House/apartment Stairs: Yes: Internal: 14 steps; on right going up and External: 2 steps; on right going up Has following equipment at home: Single point cane, Walker - 4 wheeled, and Grab bars  OCCUPATION: retired  PLOF: Independent  PATIENT GOALS: getting up from the chair and toilet, and improve B LE strength L > R  NEXT MD VISIT: about 1-2 weeks from now.  OBJECTIVE:  Note: Objective measures were completed at Evaluation unless otherwise noted.  DIAGNOSTIC FINDINGS:    PATIENT SURVEYS:  ABC scale: The Activities-Specific Balance Confidence (ABC) Scale 0% 10 20 30  40 50 60 70 80 90 100% No confidence<->completely confident  "How confident are you that you will not lose your balance or become unsteady when you . . .   Date tested 10/20/2023  Walk around the house 50%  2. Walk up or down stairs 50%  3. Bend over and pick up a slipper from in front of a closet floor 0%  4. Reach for a small can off a shelf at eye level 100%  5. Stand on tip toes and reach for something above your head 50%  6. Stand on a chair and reach for something 30%  7. Sweep the floor 60%  8. Walk outside the house to a car parked in the driveway 100%  9. Get into or out of a car 100%  10. Walk across a parking lot to the mall 80%  11. Walk up or down a ramp 80%  12. Walk in a crowded mall where people rapidly walk past you 100%  13. Are bumped into by people as you walk through the mall 80%  14. Step onto or off of an escalator while you are holding onto the railing 100%  15.  Step onto or off an escalator while holding onto parcels such that you cannot hold onto the railing 40%  16. Walk outside on  icy sidewalks 0%  Total: #/16 1020%/16 = 63.75%     COGNITION: Overall cognitive status: Within functional limits for tasks assessed     SENSATION:   MUSCLE LENGTH:   POSTURE: wide base of support, hip and knee flexion, forward flexed posture, R hip ER,   PALPATION:   LUMBAR ROM:   AROM eval  Flexion in sitting  full  Extension WFL (LOB backwards in standing)  Right lateral flexion  WFL  Left lateral flexion WFL  Right rotation in sitting WFL  Left rotation in sitting WFL   (Blank rows = not tested)  LOWER EXTREMITY ROM:     Passive  Right eval Left eval  Hip flexion    Hip extension    Hip abduction    Hip adduction    Hip internal rotation    Hip external rotation    Knee flexion    Knee extension    Ankle dorsiflexion    Ankle plantarflexion    Ankle inversion    Ankle eversion     (Blank rows = not tested)  LOWER EXTREMITY MMT:    MMT Right eval Left eval  Hip flexion 4 3-  Hip extension (seated manually resisted) 4- 4-  Hip abduction (seated manually resisted) 4 4  Hip adduction    Hip internal rotation    Hip external rotation    Knee flexion 4+ 4  Knee extension 5 4+  Ankle dorsiflexion    Ankle plantarflexion    Ankle inversion    Ankle eversion     (Blank rows = not tested)  LUMBAR SPECIAL TESTS:  (-) heel to shin test (+) finger to nose on L side.   Difficulty with rapid alternating palms up and down.   FUNCTIONAL TESTS:  Lars Balance Scale:  Item Test date: 10/20/2023 Date:  Date:   Sitting to standing 3. able to stand independently using hands Insert SmartPhrase OPRCBERGREEVAL Insert SmartPhrase OPRCBERGREEVAL  2. Standing unsupported 2. able to stand 30 seconds unsupported    3. Sitting with back unsupported, feet supported 4. able to sit safely and securely for 2 minutes    4. Standing to sitting 3.  controls descent by using hands    5. Pivot transfer  3. able to transfer safely with definite need of hands    6. Standing unsupported with eyes closed 3. able to stand 10 seconds with supervision    7. Standing unsupported with feet together 3. able to place feet together independently and stand 1 minute with supervision    8. Reaching forward with outstretched arms while standing 2. can reach forward 5 cm (2 inches)    9. Pick up object from the floor from standing 3. able to pick up slipper but needs supervision    10. Turning to look behind over left and right shoulders while standing 1. needs supervision when turning    11. Turn 360 degrees 1. needs close supervision or verbal cuing    12. Place alternate foot on step or stool while standing unsupported 0. needs assistance to keep from falling/unable to try    13. Standing unsupported one foot in front 1. needs help to step but can hold 15 seconds    14. Standing on one leg 0. unable to try of needs assist to prevent fall      Total Score 29/56 Total Score:    Total Score:       Score Interpretation: Score of <19 indicates high risk of  falls.  Minimally Clinically Important Difference (MCID):  =DGI scores of<21/24 = 1.80 points DGI scores of >21/24 = 0.60 points   Edgerton T, Inbar-Borovsky N, Brozgol M, Giladi N, [place] JM. The Dynamic Gait Index in healthy older adults: the role of stair climbing, fear of falling and gender. Gait Posture. 2009 Feb;29(2):237-41. doi: 10.1016/j.gaitpost.2008.08.013. Epub 2008 Oct 8. PMID: 81154560; PMCID: EFR7290501.  Pardasaney, MYRTIS LOIS Bonus, GEANNIE POUR., et al. (2012). Sensitivity to change and responsiveness of four balance measures for community-dwelling older adults. Physical therapy 92(3): 388-397.   GAIT: Distance walked: 30 ft Assistive device utilized: Single point cane Level of assistance: Modified independence and SBA Comments: SPC on R, decreased stance R LE, forward flexed, decreased  gait velocity.   TREATMENT DATE: 11/23/2023                                                                                                                               Therapeutic exercises  Standing with B UE assist  Ankle DF/PF on rocker board 2 minutes to promote gastroc muscle stretching.    Standing with B UE assist   Hip abduction    R 10x3 with 5 second holds   L 10x3 with 5 second holds  Improved exercise technique, movement at target joints, use of target muscles after mod verbal, visual, tactile cues.    Neuromuscular re education  Forward step up onto Air ex pad with one UE assist   R 10x3  L 10x3  Standing on Air Ex pad   Feet shoulder width apart   Eyes open 30 seconds x 2, CGA    Eyes closed 30 seconds x 3 with light touch assist PRN, CGA    Ankle strategy utilization observed.     Feet together   Eyes open 30 seconds x 3 with UE assist PRN CGA    Eyes closed 30 seconds x 3 with UE assist PRN CGA   Improved technique, movement at target joints, use of target muscles after mod verbal, visual, tactile cues.       PATIENT EDUCATION:  Education details: POC Person educated: Patient Education method: Explanation Education comprehension: verbalized understanding  HOME EXERCISE PROGRAM: Access Code: [email or web address] URL: [email or web address] Date: 11/08/2023 Prepared by: Emil Glassman  Exercises - Seated Hip Flexion  - 1 x daily - 7 x weekly - 3 sets - 5 reps - Narrow Stance with Counter Support  - 3 x daily - 7 x weekly - 1 sets - 5 reps - 30 seconds hold - Standing Gastroc Stretch at Counter  - 3 x daily - 7 x weekly - 1 sets - 3 reps - 1 minute hold  - Standing Single Leg Stance with Counter Support  - 1 x daily - 7 x weekly - 3 sets - 10 reps - 5 seconds hold  - Seated Ankle Plantar Flexion with Resistance Loop  - 1 x daily - 7 x weekly - 3  sets - 10 reps  Blue band.    ASSESSMENT:  CLINICAL IMPRESSION: Worked on B glute med  strengthening as well as improving ability to place and maintain his center of gravity over his base of support on soft/uneven surface to promote balance both with eyes open and closed and with a more narrow base of support. Pt demonstrates tendency for backwards weight shifting, cues needed to promote gentle forward weight shifting to maintain balance. Improved ankle strategy observed with balance exercises. Pt tolerated session well without aggravation of symptoms. Pt will benefit from continued skilled physical therapy services to improve strength, balance and function.    OBJECTIVE IMPAIRMENTS: Abnormal gait, decreased balance, difficulty walking, decreased strength, improper body mechanics, and postural dysfunction.   ACTIVITY LIMITATIONS: carrying, lifting, bending, standing, squatting, stairs, transfers, toileting, dressing, and locomotion level  PARTICIPATION LIMITATIONS:   PERSONAL FACTORS: Age, Fitness, Past/current experiences, Time since onset of injury/illness/exacerbation, and 3+ comorbidities: Cancer, HTN, arthritis are also affecting patient's functional outcome.   REHAB POTENTIAL: Fair    CLINICAL DECISION MAKING: Evolving/moderate complexity; balance is worsening per pt.   EVALUATION COMPLEXITY: Moderate   GOALS: Goals reviewed with patient? Yes  SHORT TERM GOALS: Target date: 10/28/2023  Pt will be independent with his initial HEP to improve LE strength, balance, and function.  Baseline: Pt has not yet started his initial HEP (10/20/2023) Goal status: INITIAL   LONG TERM GOALS: Target date: 12/16/2023  Pt will improve his Berg Balance Test score to at least 48/56 as a demonstration of improved balance and decreased need for AD.  Baseline: 29/56 (10/20/2023) Goal status: INITIAL  2.  Pt will improve his B LE strength by at least 1/2 MMT grade to promote ability to perform transfers and standing tasks with less difficulty and with improved balance.  Baseline:  MMT  Right eval Left eval  Hip flexion 4 3-  Hip extension (seated manually resisted) 4- 4-  Hip abduction (seated manually resisted) 4 4  Knee flexion 4+ 4  Knee extension 5 4+   Goal status: INITIAL  3.  Pt will improve his Activities Specific Balance Confidence (ABC) scale by at least 15% as a demonstration of improved balance.  Baseline:  63.75% (10/20/2023) Goal status: INITIAL   PLAN:  PT FREQUENCY: 1-2x/week  PT DURATION: 8 weeks  PLANNED INTERVENTIONS: 97110-Therapeutic exercises, 97530- Therapeutic activity, [id number]- Neuromuscular re-education, 97535- Self Care, [id number]- Manual therapy, [phone]- Gait training, Patient/Family education, Balance training, and Stair training.  PLAN FOR NEXT SESSION: posture, trunk, hip and knee strengthening, balance training, manual techniques, modalities PRN.    Halley Shepheard, PT, DPT 11/23/2023, 3:29 PM

## 2023-11-24 ENCOUNTER — Ambulatory Visit

## 2023-11-28 ENCOUNTER — Ambulatory Visit

## 2023-11-28 DIAGNOSIS — M6281 Muscle weakness (generalized): Secondary | ICD-10-CM

## 2023-11-28 DIAGNOSIS — Z9181 History of falling: Secondary | ICD-10-CM

## 2023-11-28 DIAGNOSIS — R2681 Unsteadiness on feet: Secondary | ICD-10-CM | POA: Diagnosis not present

## 2023-11-28 NOTE — Therapy (Signed)
 OUTPATIENT PHYSICAL THERAPY TREATMENT   Patient Name: Leonard Stokes MRN: [id number] DOB:Jul 29, 1945, 78 y.o., male Today's Date: 11/28/2023  END OF SESSION:  PT End of Session - 11/28/23 1519     Visit Number 9    Number of Visits 17    Date for Recertification  12/16/23    PT Start Time 1520    PT Stop Time 1558    PT Time Calculation (min) 38 min    Activity Tolerance Patient tolerated treatment well    Behavior During Therapy [facility] for tasks assessed/performed                  Past Medical History:  Diagnosis Date   Anemia    Arthritis    Atherosclerosis of aorta 2018   BPH (benign prostatic hyperplasia)    Cancer (HCC) 2019   prostate   Chronic kidney disease 08/2016   kidney stone   Diverticulosis of sigmoid colon    History of kidney stones    Hypertension    Leg cramps    Past Surgical History:  Procedure Laterality Date   APPENDECTOMY     EXTRACORPOREAL SHOCK WAVE LITHOTRIPSY Right 08/19/2016   Procedure: EXTRACORPOREAL SHOCK WAVE LITHOTRIPSY (ESWL);  Surgeon: Penne Knee, MD;  Location: [facility] ORS;  Service: Urology;  Laterality: Right;   EXTRACORPOREAL SHOCK WAVE LITHOTRIPSY Left 03/24/2017   Procedure: EXTRACORPOREAL SHOCK WAVE LITHOTRIPSY (ESWL);  Surgeon: Penne Knee, MD;  Location: [facility] ORS;  Service: Urology;  Laterality: Left;   EYE MUSCLE SURGERY     INGUINAL HERNIA REPAIR Right 12/23/2017   Direct hernia, medium Ultra Pro mesh;  Surgeon: Dessa Reyes ORN, MD;  Location: [facility] ORS;  Service: General;  Laterality: Right;   JOINT REPLACEMENT     KNEE ARTHROPLASTY Left 11/15/2016   Procedure: COMPUTER ASSISTED TOTAL KNEE ARTHROPLASTY;  Surgeon: Mardee Lynwood SQUIBB, MD;  Location: [facility] ORS;  Service: Orthopedics;  Laterality: Left;   TOTAL KNEE ARTHROPLASTY Right    Patient Active Problem List   Diagnosis Date Noted   Mood swing 10/05/2023   Gait instability 10/05/2023   Encounter for monitoring androgen deprivation therapy 09/15/2023    Neoplasm related pain 09/15/2023   Prostate cancer metastatic to multiple sites [facility]) 12/01/2017   Right inguinal hernia 04/15/2017   Hypertension 11/30/2016   S/P total knee arthroplasty 11/15/2016   Personal history of tobacco use, presenting hazards to health 09/26/2014    PCP: Diedra Lame, MD   REFERRING PROVIDER: Babara Call, MD   REFERRING DIAG: C61 (ICD-10-CM) - Prostate cancer metastatic to multiple sites (HCC) R53.1 (ICD-10-CM) - Weakness  Rationale for Evaluation and Treatment: Rehabilitation  THERAPY DIAG:  Unsteadiness on feet  Muscle weakness (generalized)  History of falling  ONSET DATE: 10/05/2023 (Date PT referral signed)  SUBJECTIVE:  SUBJECTIVE STATEMENT: Balance progress feels slow. Feels like he walks backwards in order to regain his balance. Whenever he is up, he likes to back into or touch something to get balance. Does not have neuropathy/tingling or numbness in his feet. More balanced when wearing shoes.     PERTINENT HISTORY:  Weakness, decreased balance. Uneven surfaces causes him to lose balance. Also is unsteady when he first wakes up in the morning. Difficulty with balance started about 6-8 weeks ago which is worsening. Pt was cleaning up after his dog and lost balance going forward and could not get up. His PT neighbor helped him up. Had difficulty using his L LE to get himself up.    Blood pressure is controlled per pt.  No latex allergies.    No pain currently. Pain is primarily at night, around his abdominal area.   Standing up from a seated position such as a toilet, is very difficult, needs a grab bar assist.      PAIN:  Are you having pain? Yes: NPRS scale: none at the moment.  Pain location: abdominal area and some in the back, might be cancer  related.  Pain description: achy Aggravating factors: unknown, does not have it all the time.  Relieving factors: oxycodone   PRECAUTIONS: Fall and Other: CA  RED FLAGS: Bowel or bladder incontinence: No and Cauda equina syndrome: No   WEIGHT BEARING RESTRICTIONS: No  FALLS:  Has patient fallen in last 6 months? Yes. Number of falls 6, pt states catching himself most of the time. Did not really fall, he just loss balance. Uneven surfaces causes him to lose balance  LIVING ENVIRONMENT: Lives with: lives with their spouse Lives in: House/apartment Stairs: Yes: Internal: 14 steps; on right going up and External: 2 steps; on right going up Has following equipment at home: Single point cane, Walker - 4 wheeled, and Grab bars  OCCUPATION: retired  PLOF: Independent  PATIENT GOALS: getting up from the chair and toilet, and improve B LE strength L > R  NEXT MD VISIT: about 1-2 weeks from now.  OBJECTIVE:  Note: Objective measures were completed at Evaluation unless otherwise noted.  DIAGNOSTIC FINDINGS:    PATIENT SURVEYS:  ABC scale: The Activities-Specific Balance Confidence (ABC) Scale 0% 10 20 30  40 50 60 70 80 90 100% No confidence<->completely confident  "How confident are you that you will not lose your balance or become unsteady when you . . .   Date tested 10/20/2023  Walk around the house 50%  2. Walk up or down stairs 50%  3. Bend over and pick up a slipper from in front of a closet floor 0%  4. Reach for a small can off a shelf at eye level 100%  5. Stand on tip toes and reach for something above your head 50%  6. Stand on a chair and reach for something 30%  7. Sweep the floor 60%  8. Walk outside the house to a car parked in the driveway 100%  9. Get into or out of a car 100%  10. Walk across a parking lot to the mall 80%  11. Walk up or down a ramp 80%  12. Walk in a crowded mall where people rapidly walk past you 100%  13. Are bumped into by people as you  walk through the mall 80%  14. Step onto or off of an escalator while you are holding onto the railing 100%  15. Step onto or off an escalator  while holding onto parcels such that you cannot hold onto the railing 40%  16. Walk outside on icy sidewalks 0%  Total: #/16 1020%/16 = 63.75%     COGNITION: Overall cognitive status: Within functional limits for tasks assessed     SENSATION:   MUSCLE LENGTH:   POSTURE: wide base of support, hip and knee flexion, forward flexed posture, R hip ER,   PALPATION:   LUMBAR ROM:   AROM eval  Flexion in sitting  full  Extension WFL (LOB backwards in standing)  Right lateral flexion  WFL  Left lateral flexion WFL  Right rotation in sitting WFL  Left rotation in sitting WFL   (Blank rows = not tested)  LOWER EXTREMITY ROM:     Passive  Right eval Left eval  Hip flexion    Hip extension    Hip abduction    Hip adduction    Hip internal rotation    Hip external rotation    Knee flexion    Knee extension    Ankle dorsiflexion    Ankle plantarflexion    Ankle inversion    Ankle eversion     (Blank rows = not tested)  LOWER EXTREMITY MMT:    MMT Right eval Left eval  Hip flexion 4 3-  Hip extension (seated manually resisted) 4- 4-  Hip abduction (seated manually resisted) 4 4  Hip adduction    Hip internal rotation    Hip external rotation    Knee flexion 4+ 4  Knee extension 5 4+  Ankle dorsiflexion    Ankle plantarflexion    Ankle inversion    Ankle eversion     (Blank rows = not tested)  LUMBAR SPECIAL TESTS:  (-) heel to shin test (+) finger to nose on L side.   Difficulty with rapid alternating palms up and down.   FUNCTIONAL TESTS:  Lars Balance Scale:  Item Test date: 10/20/2023 Date:  Date:   Sitting to standing 3. able to stand independently using hands Insert SmartPhrase OPRCBERGREEVAL Insert SmartPhrase OPRCBERGREEVAL  2. Standing unsupported 2. able to stand 30 seconds unsupported    3. Sitting with  back unsupported, feet supported 4. able to sit safely and securely for 2 minutes    4. Standing to sitting 3. controls descent by using hands    5. Pivot transfer  3. able to transfer safely with definite need of hands    6. Standing unsupported with eyes closed 3. able to stand 10 seconds with supervision    7. Standing unsupported with feet together 3. able to place feet together independently and stand 1 minute with supervision    8. Reaching forward with outstretched arms while standing 2. can reach forward 5 cm (2 inches)    9. Pick up object from the floor from standing 3. able to pick up slipper but needs supervision    10. Turning to look behind over left and right shoulders while standing 1. needs supervision when turning    11. Turn 360 degrees 1. needs close supervision or verbal cuing    12. Place alternate foot on step or stool while standing unsupported 0. needs assistance to keep from falling/unable to try    13. Standing unsupported one foot in front 1. needs help to step but can hold 15 seconds    14. Standing on one leg 0. unable to try of needs assist to prevent fall      Total Score 29/56 Total Score:  Total Score:       Score Interpretation: Score of <19 indicates high risk of falls.  Minimally Clinically Important Difference (MCID):  =DGI scores of<21/24 = 1.80 points DGI scores of >21/24 = 0.60 points   La Conner T, Inbar-Borovsky N, Brozgol M, Giladi N, [place] JM. The Dynamic Gait Index in healthy older adults: the role of stair climbing, fear of falling and gender. Gait Posture. 2009 Feb;29(2):237-41. doi: 10.1016/j.gaitpost.2008.08.013. Epub 2008 Oct 8. PMID: 81154560; PMCID: EFR7290501.  Pardasaney, MYRTIS LOIS Bonus, GEANNIE POUR., et al. (2012). Sensitivity to change and responsiveness of four balance measures for community-dwelling older adults. Physical therapy 92(3): 388-397.   Item Test date: 11/28/2023 Date:  Date:   Sitting to standing 3. able to stand  independently using hands Insert SmartPhrase OPRCBERGREEVAL Insert SmartPhrase OPRCBERGREEVAL  2. Standing unsupported 3. able to stand 2 minutes with supervision    3. Sitting with back unsupported, feet supported 4. able to sit safely and securely for 2 minutes    4. Standing to sitting 4. sits safely with minimal use of hands    5. Pivot transfer  3. able to transfer safely with definite need of hands    6. Standing unsupported with eyes closed 3. able to stand 10 seconds with supervision    7. Standing unsupported with feet together 0. needs help to attain position and unable to hold for 15 seconds    8. Reaching forward with outstretched arms while standing 0. loses balance while trying/requires external support    9. Pick up object from the floor from standing 3. able to pick up slipper but needs supervision    10. Turning to look behind over left and right shoulders while standing 3. looks behind one side only, other side shows less weight shift    11. Turn 360 degrees 0. needs assistance while turning    12. Place alternate foot on step or stool while standing unsupported 0. needs assistance to keep from falling/unable to try    13. Standing unsupported one foot in front 0. loses balance while stepping or standing    14. Standing on one leg 0. unable to try of needs assist to prevent fall      Total Score 26/56 Total Score:    Total Score:      GAIT: Distance walked: 30 ft Assistive device utilized: Single point cane Level of assistance: Modified independence and SBA Comments: SPC on R, decreased stance R LE, forward flexed, decreased gait velocity.   TREATMENT DATE: 11/28/2023                                                                                                                               Neuromuscular re education  Directed patient with sit <> stand throughout session Stand pivot transfer chair <> low mat table Static standing shoulder width apart, then with eyes  closed, then with eyes open feet together, tandem stance with feet shoulder width  apart Picking up an object (computer mouse) from the floor,   Turning 360 degrees to the R and to the L 1x Looking behind to the R and to the L,   Standing forward reach,    Standing alternate toe taps 4 x each LE onto first regular step  Standing cervical AROM  Cervical Active ROM in standing A/PROM (deg) 11/28/2023  Flexion WFL  Extension WFL with LOB  Right lateral flexion WFL  Left lateral flexion WFL  Right rotation [facility] with LOB  Left rotation WFL with LOB   (Blank rows = not tested)    No dizziness or vision changes   Sitting B scapular retraction to promote thoracic extension to promote cervical movement 10x2 with 5 second holds  Seated chin tucks to promote thoracic and cervical mobility 10x5 seconds   Possible improved balance after thoracic extension exercises reported by pt  Improved technique, movement at target joints, use of target muscles after mod verbal, visual, tactile cues.       PATIENT EDUCATION:  Education details: POC Person educated: Patient Education method: Explanation Education comprehension: verbalized understanding  HOME EXERCISE PROGRAM: Access Code: [email or web address] URL: [email or web address] Date: 11/08/2023 Prepared by: Emil Glassman  Exercises - Seated Hip Flexion  - 1 x daily - 7 x weekly - 3 sets - 5 reps - Narrow Stance with Counter Support  - 3 x daily - 7 x weekly - 1 sets - 5 reps - 30 seconds hold - Standing Gastroc Stretch at Counter  - 3 x daily - 7 x weekly - 1 sets - 3 reps - 1 minute hold  - Standing Single Leg Stance with Counter Support  - 1 x daily - 7 x weekly - 3 sets - 10 reps - 5 seconds hold  - Seated Ankle Plantar Flexion with Resistance Loop  - 1 x daily - 7 x weekly - 3 sets - 10 reps  Blue band.    ASSESSMENT:  CLINICAL IMPRESSION: Pt demonstrates decreased Berg Balance score compared to previous measurement with  pt tendency to lose balance backwards. Performed standing cervical AROM and pt demonstrated LOB with cervical extension as well as R and L cervical rotation AROM. Worked on upper thoracic mobility exercises to help decrease cervical stiffness secondary to possible cervical involvement with unsteadiness on his feet. Possible improved balance reported by pt after thoracic extension exercises reported.  Pt tolerated session well without aggravation of symptoms. Pt will benefit from continued skilled physical therapy services to improve strength, balance and function.    OBJECTIVE IMPAIRMENTS: Abnormal gait, decreased balance, difficulty walking, decreased strength, improper body mechanics, and postural dysfunction.   ACTIVITY LIMITATIONS: carrying, lifting, bending, standing, squatting, stairs, transfers, toileting, dressing, and locomotion level  PARTICIPATION LIMITATIONS:   PERSONAL FACTORS: Age, Fitness, Past/current experiences, Time since onset of injury/illness/exacerbation, and 3+ comorbidities: Cancer, HTN, arthritis are also affecting patient's functional outcome.   REHAB POTENTIAL: Fair    CLINICAL DECISION MAKING: Evolving/moderate complexity; balance is worsening per pt.   EVALUATION COMPLEXITY: Moderate   GOALS: Goals reviewed with patient? Yes  SHORT TERM GOALS: Target date: 10/28/2023  Pt will be independent with his initial HEP to improve LE strength, balance, and function.  Baseline: Pt has not yet started his initial HEP (10/20/2023) Goal status: INITIAL   LONG TERM GOALS: Target date: 12/16/2023  Pt will improve his Berg Balance Test score to at least 48/56 as a demonstration of improved balance and decreased need for AD.  Baseline: 29/56 (  10/20/2023); 26/56 (11/28/2023) Goal status: ONGOING  2.  Pt will improve his B LE strength by at least 1/2 MMT grade to promote ability to perform transfers and standing tasks with less difficulty and with improved balance.   Baseline:  MMT Right eval Left eval  Hip flexion 4 3-  Hip extension (seated manually resisted) 4- 4-  Hip abduction (seated manually resisted) 4 4  Knee flexion 4+ 4  Knee extension 5 4+   Goal status: INITIAL  3.  Pt will improve his Activities Specific Balance Confidence (ABC) scale by at least 15% as a demonstration of improved balance.  Baseline:  63.75% (10/20/2023) Goal status: INITIAL   PLAN:  PT FREQUENCY: 1-2x/week  PT DURATION: 8 weeks  PLANNED INTERVENTIONS: 97110-Therapeutic exercises, 97530- Therapeutic activity, [id number]- Neuromuscular re-education, 97535- Self Care, [id number]- Manual therapy, [phone]- Gait training, Patient/Family education, Balance training, and Stair training.  PLAN FOR NEXT SESSION: posture, trunk, hip and knee strengthening, balance training, manual techniques, modalities PRN.    Dirck Butch, PT, DPT 11/28/2023, 4:05 PM

## 2023-11-30 ENCOUNTER — Ambulatory Visit

## 2023-11-30 DIAGNOSIS — R2681 Unsteadiness on feet: Secondary | ICD-10-CM

## 2023-11-30 DIAGNOSIS — M6281 Muscle weakness (generalized): Secondary | ICD-10-CM

## 2023-11-30 DIAGNOSIS — Z9181 History of falling: Secondary | ICD-10-CM

## 2023-11-30 NOTE — Therapy (Signed)
 OUTPATIENT PHYSICAL THERAPY TREATMENT And Progress Report (10/20/2023 - 11/30/2023)   Patient Name: Leonard Stokes MRN: [id number] DOB:11-Jul-1945, 78 y.o., male Today's Date: 11/30/2023  END OF SESSION:  PT End of Session - 11/30/23 1432     Visit Number 10    Number of Visits 17    Date for Recertification  12/16/23    PT Start Time 1433    PT Stop Time 1513    PT Time Calculation (min) 40 min    Activity Tolerance Patient tolerated treatment well    Behavior During Therapy [facility] for tasks assessed/performed                   Past Medical History:  Diagnosis Date   Anemia    Arthritis    Atherosclerosis of aorta 2018   BPH (benign prostatic hyperplasia)    Cancer (HCC) 2019   prostate   Chronic kidney disease 08/2016   kidney stone   Diverticulosis of sigmoid colon    History of kidney stones    Hypertension    Leg cramps    Past Surgical History:  Procedure Laterality Date   APPENDECTOMY     EXTRACORPOREAL SHOCK WAVE LITHOTRIPSY Right 08/19/2016   Procedure: EXTRACORPOREAL SHOCK WAVE LITHOTRIPSY (ESWL);  Surgeon: Penne Knee, MD;  Location: [facility] ORS;  Service: Urology;  Laterality: Right;   EXTRACORPOREAL SHOCK WAVE LITHOTRIPSY Left 03/24/2017   Procedure: EXTRACORPOREAL SHOCK WAVE LITHOTRIPSY (ESWL);  Surgeon: Penne Knee, MD;  Location: [facility] OR[facility];  Service: Urology;  Laterality: Left;   EYE MUSCLE SURGERY     INGUINAL HERNIA REPAIR Right 12/23/2017   Direct hernia, medium Ultra Pro mesh;  Surgeon: Dessa Reyes ORN, MD;  Location: [facility] OR[facility];  Service: General;  Laterality: Right;   JOINT REPLACEMENT     KNEE ARTHROPLASTY Left 11/15/2016   Procedure: COMPUTER ASSISTED TOTAL KNEE ARTHROPLASTY;  Surgeon: Mardee Lynwood SQUIBB, MD;  Location: [facility] OR[facility];  Service: Orthopedics;  Laterality: Left;   TOTAL KNEE ARTHROPLASTY Right    Patient Active Problem List   Diagnosis Date Noted   Mood swing 10/05/2023   Gait instability 10/05/2023   Encounter for  monitoring androgen deprivation therapy 09/15/2023   Neoplasm related pain 09/15/2023   Prostate cancer metastatic to multiple sites [facility]) 12/01/2017   Right inguinal hernia 04/15/2017   Hypertension 11/30/2016   S/P total knee arthroplasty 11/15/2016   Personal history of tobacco use, presenting hazards to health 09/26/2014    PCP: Diedra Lame, MD   REFERRING PROVIDER: Babara Call, MD   REFERRING DIAG: C61 (ICD-10-CM) - Prostate cancer metastatic to multiple sites (HCC) R53.1 (ICD-10-CM) - Weakness  Rationale for Evaluation and Treatment: Rehabilitation  THERAPY DIAG:  Unsteadiness on feet  Muscle weakness (generalized)  History of falling  ONSET DATE: 10/05/2023 (Date PT referral signed)  SUBJECTIVE:  SUBJECTIVE STATEMENT: Has an oncology appointment tomorrow. L hip flexion strength is getting better.      PERTINENT HISTORY:  Weakness, decreased balance. Uneven surfaces causes him to lose balance. Also is unsteady when he first wakes up in the morning. Difficulty with balance started about 6-8 weeks ago which is worsening. Pt was cleaning up after his dog and lost balance going forward and could not get up. His PT neighbor helped him up. Had difficulty using his L LE to get himself up.    Blood pressure is controlled per pt.  No latex allergies.    No pain currently. Pain is primarily at night, around his abdominal area.   Standing up from a seated position such as a toilet, is very difficult, needs a grab bar assist.      PAIN:  Are you having pain? Yes: NPRS scale: none at the moment.  Pain location: abdominal area and some in the back, might be cancer related.  Pain description: achy Aggravating factors: unknown, does not have it all the time.  Relieving factors:  oxycodone   PRECAUTIONS: Fall and Other: CA  RED FLAGS: Bowel or bladder incontinence: No and Cauda equina syndrome: No   WEIGHT BEARING RESTRICTIONS: No  FALLS:  Has patient fallen in last 6 months? Yes. Number of falls 6, pt states catching himself most of the time. Did not really fall, he just loss balance. Uneven surfaces causes him to lose balance  LIVING ENVIRONMENT: Lives with: lives with their spouse Lives in: House/apartment Stairs: Yes: Internal: 14 steps; on right going up and External: 2 steps; on right going up Has following equipment at home: Single point cane, Walker - 4 wheeled, and Grab bars  OCCUPATION: retired  PLOF: Independent  PATIENT GOALS: getting up from the chair and toilet, and improve B LE strength L > R  NEXT MD VISIT: about 1-2 weeks from now.  OBJECTIVE:  Note: Objective measures were completed at Evaluation unless otherwise noted.  DIAGNOSTIC FINDINGS:    PATIENT SURVEYS:  ABC scale: The Activities-Specific Balance Confidence (ABC) Scale 0% 10 20 30  40 50 60 70 80 90 100% No confidence<->completely confident  "How confident are you that you will not lose your balance or become unsteady when you . . .   Date tested 10/20/2023  Walk around the house 50%  2. Walk up or down stairs 50%  3. Bend over and pick up a slipper from in front of a closet floor 0%  4. Reach for a small can off a shelf at eye level 100%  5. Stand on tip toes and reach for something above your head 50%  6. Stand on a chair and reach for something 30%  7. Sweep the floor 60%  8. Walk outside the house to a car parked in the driveway 100%  9. Get into or out of a car 100%  10. Walk across a parking lot to the mall 80%  11. Walk up or down a ramp 80%  12. Walk in a crowded mall where people rapidly walk past you 100%  13. Are bumped into by people as you walk through the mall 80%  14. Step onto or off of an escalator while you are holding onto the railing 100%  15.  Step onto or off an escalator while holding onto parcels such that you cannot hold onto the railing 40%  16. Walk outside on icy sidewalks 0%  Total: #/16 1020%/16 = 63.75%  COGNITION: Overall cognitive status: Within functional limits for tasks assessed     SENSATION:   MUSCLE LENGTH:   POSTURE: wide base of support, hip and knee flexion, forward flexed posture, R hip ER,   PALPATION:   LUMBAR ROM:   AROM eval  Flexion in sitting  full  Extension WFL (LOB backwards in standing)  Right lateral flexion  WFL  Left lateral flexion WFL  Right rotation in sitting WFL  Left rotation in sitting WFL   (Blank rows = not tested)  LOWER EXTREMITY ROM:     Passive  Right 11/30/2023 Left 11/30/2023  Hip flexion    Hip extension    Hip abduction    Hip adduction    Hip internal rotation    Hip external rotation    Knee flexion    Knee extension    Ankle dorsiflexion -38 (-10 AAROM) -20 (-4 AAROM)  Ankle plantarflexion    Ankle inversion    Ankle eversion     (Blank rows = not tested)  LOWER EXTREMITY MMT:    MMT Right eval Left eval  Hip flexion 4 3-  Hip extension (seated manually resisted) 4- 4-  Hip abduction (seated manually resisted) 4 4  Hip adduction    Hip internal rotation    Hip external rotation    Knee flexion 4+ 4  Knee extension 5 4+  Ankle dorsiflexion    Ankle plantarflexion    Ankle inversion    Ankle eversion     (Blank rows = not tested)  LUMBAR SPECIAL TESTS:  (-) heel to shin test (+) finger to nose on L side.   Difficulty with rapid alternating palms up and down.   FUNCTIONAL TESTS:  Lars Balance Scale:  Item Test date: 10/20/2023 Date:  Date:   Sitting to standing 3. able to stand independently using hands Insert SmartPhrase OPRCBERGREEVAL Insert SmartPhrase OPRCBERGREEVAL  2. Standing unsupported 2. able to stand 30 seconds unsupported    3. Sitting with back unsupported, feet supported 4. able to sit safely and securely for 2  minutes    4. Standing to sitting 3. controls descent by using hands    5. Pivot transfer  3. able to transfer safely with definite need of hands    6. Standing unsupported with eyes closed 3. able to stand 10 seconds with supervision    7. Standing unsupported with feet together 3. able to place feet together independently and stand 1 minute with supervision    8. Reaching forward with outstretched arms while standing 2. can reach forward 5 cm (2 inches)    9. Pick up object from the floor from standing 3. able to pick up slipper but needs supervision    10. Turning to look behind over left and right shoulders while standing 1. needs supervision when turning    11. Turn 360 degrees 1. needs close supervision or verbal cuing    12. Place alternate foot on step or stool while standing unsupported 0. needs assistance to keep from falling/unable to try    13. Standing unsupported one foot in front 1. needs help to step but can hold 15 seconds    14. Standing on one leg 0. unable to try of needs assist to prevent fall      Total Score 29/56 Total Score:    Total Score:       Score Interpretation: Score of <19 indicates high risk of falls.  Minimally Clinically Important Difference (MCID):  =DGI  scores of<21/24 = 1.80 points DGI scores of >21/24 = 0.60 points   Chrisman T, Inbar-Borovsky N, Brozgol M, Giladi N, [place] JM. The Dynamic Gait Index in healthy older adults: the role of stair climbing, fear of falling and gender. Gait Posture. 2009 Feb;29(2):237-41. doi: 10.1016/j.gaitpost.2008.08.013. Epub 2008 Oct 8. PMID: 81154560; PMCID: EFR7290501.  Pardasaney, MYRTIS LOIS Bonus, GEANNIE POUR., et al. (2012). Sensitivity to change and responsiveness of four balance measures for community-dwelling older adults. Physical therapy 92(3): 388-397.   Item Test date: 11/28/2023 Date:  Date:   Sitting to standing 3. able to stand independently using hands Insert SmartPhrase OPRCBERGREEVAL Insert SmartPhrase  OPRCBERGREEVAL  2. Standing unsupported 3. able to stand 2 minutes with supervision    3. Sitting with back unsupported, feet supported 4. able to sit safely and securely for 2 minutes    4. Standing to sitting 4. sits safely with minimal use of hands    5. Pivot transfer  3. able to transfer safely with definite need of hands    6. Standing unsupported with eyes closed 3. able to stand 10 seconds with supervision    7. Standing unsupported with feet together 0. needs help to attain position and unable to hold for 15 seconds    8. Reaching forward with outstretched arms while standing 0. loses balance while trying/requires external support    9. Pick up object from the floor from standing 3. able to pick up slipper but needs supervision    10. Turning to look behind over left and right shoulders while standing 3. looks behind one side only, other side shows less weight shift    11. Turn 360 degrees 0. needs assistance while turning    12. Place alternate foot on step or stool while standing unsupported 0. needs assistance to keep from falling/unable to try    13. Standing unsupported one foot in front 0. loses balance while stepping or standing    14. Standing on one leg 0. unable to try of needs assist to prevent fall      Total Score 26/56 Total Score:    Total Score:      GAIT: Distance walked: 30 ft Assistive device utilized: Single point cane Level of assistance: Modified independence and SBA Comments: SPC on R, decreased stance R LE, forward flexed, decreased gait velocity.   TREATMENT DATE: 11/30/2023                                                                                                                               Therapeutic activities Performed with the intent of improving ability to perform standing tasks with less difficulty and less fall risk.   Seated manually resisted hip flexion, hip extension, abduction, knee flexion, knee extension 1x each way for each  LE Reviewed progress/current status with strength with pt.     Reclined   Ankle DF stretch with tibialis anterior activation to promote forward weight shifting and  ankle strategy for balance.    R 10x5 seconds for 3 sets   L 10x5 seconds for 3 sets  Standing gastroc stretch with B UE assist   R 1 minute for 2 sets   L 1 minute for 2 sets   Cues to keep stretch foot in neutral (instead of in external rotation) for more even stretch   Improved technique, movement at target joints, use of target muscles after mod verbal, visual, tactile cues.       PATIENT EDUCATION:  Education details: POC Person educated: Patient Education method: Explanation Education comprehension: verbalized understanding  HOME EXERCISE PROGRAM: Access Code: [email or web address] URL: [email or web address] Date: 11/08/2023 Prepared by: Emil Glassman  Exercises - Seated Hip Flexion  - 1 x daily - 7 x weekly - 3 sets - 5 reps - Narrow Stance with Counter Support  - 3 x daily - 7 x weekly - 1 sets - 5 reps - 30 seconds hold - Standing Gastroc Stretch at Counter  - 3 x daily - 7 x weekly - 1 sets - 3 reps - 1 minute hold  - Standing Single Leg Stance with Counter Support  - 1 x daily - 7 x weekly - 3 sets - 10 reps - 5 seconds hold  - Seated Ankle Plantar Flexion with Resistance Loop  - 1 x daily - 7 x weekly - 3 sets - 10 reps  Blue band.    ASSESSMENT:  CLINICAL IMPRESSION: Pt demonstrates improved B LE strength since initial evaluation. Pt however still has difficulty with balance with tendency for backward lean causing pt to lose balance backwards frequently. Pt currently demonstrates limited B ankle DF A/AROM which may cause him to lose balance backwards. Worked on improving ankle DF ROM and tibialis anterior muscle activation to help address and promote forward weight shifting and ankle strategy with balance.  Pt tolerated session well without aggravation of symptoms. Pt will benefit from continued  skilled physical therapy services to improve strength, balance and function.    OBJECTIVE IMPAIRMENTS: Abnormal gait, decreased balance, difficulty walking, decreased strength, improper body mechanics, and postural dysfunction.   ACTIVITY LIMITATIONS: carrying, lifting, bending, standing, squatting, stairs, transfers, toileting, dressing, and locomotion level  PARTICIPATION LIMITATIONS:   PERSONAL FACTORS: Age, Fitness, Past/current experiences, Time since onset of injury/illness/exacerbation, and 3+ comorbidities: Cancer, HTN, arthritis are also affecting patient's functional outcome.   REHAB POTENTIAL: Fair    CLINICAL DECISION MAKING: Evolving/moderate complexity; balance is worsening per pt.   EVALUATION COMPLEXITY: Moderate   GOALS: Goals reviewed with patient? Yes  SHORT TERM GOALS: Target date: 10/28/2023  Pt will be independent with his initial HEP to improve LE strength, balance, and function.  Baseline: Pt has not yet started his initial HEP (10/20/2023); no questions (11/30/2023) Goal status: MET   LONG TERM GOALS: Target date: 12/16/2023  Pt will improve his Berg Balance Test score to at least 48/56 as a demonstration of improved balance and decreased need for AD.  Baseline: 29/56 (10/20/2023); 26/56 (11/28/2023) Goal status: ONGOING  2.  Pt will improve his B LE strength by at least 1/2 MMT grade to promote ability to perform transfers and standing tasks with less difficulty and with improved balance.  Baseline:  MMT Right eval Left eval R 11/30/2023 L 11/30/2023  Hip flexion 4 3- 4 4-  Hip extension (seated manually resisted) 4- 4- 4+ 4+  Hip abduction (seated manually resisted) 4 4 4+ 4+  Knee flexion 4+ 4 5 4+  Knee extension  5 4+ 5 5   Goal status: PARTIALLY MET   3.  Pt will improve his Activities Specific Balance Confidence (ABC) scale by at least 15% as a demonstration of improved balance.  Baseline:  63.75% (9/18/025); 76% (11/30/2023) Goal status:  PARTIALLY MET   PLAN:  PT FREQUENCY: 1-2x/week  PT DURATION: 8 weeks  PLANNED INTERVENTIONS: 97110-Therapeutic exercises, 97530- Therapeutic activity, [id number]- Neuromuscular re-education, 97535- Self Care, [id number]- Manual therapy, [phone]- Gait training, Patient/Family education, Balance training, and Stair training.  PLAN FOR NEXT SESSION: posture, trunk, hip and knee strengthening, balance training, manual techniques, modalities PRN.   Thank you for your referral.  Kani Chauvin, PT, DPT 11/30/2023, 4:41 PM

## 2023-12-01 ENCOUNTER — Inpatient Hospital Stay

## 2023-12-01 ENCOUNTER — Inpatient Hospital Stay (HOSPITAL_BASED_OUTPATIENT_CLINIC_OR_DEPARTMENT_OTHER): Admitting: Oncology

## 2023-12-01 ENCOUNTER — Encounter: Payer: Self-pay | Admitting: Oncology

## 2023-12-01 VITALS — BP 120/69 | HR 71 | Temp 97.1°F | Resp 18 | Wt 167.7 lb

## 2023-12-01 DIAGNOSIS — D696 Thrombocytopenia, unspecified: Secondary | ICD-10-CM | POA: Insufficient documentation

## 2023-12-01 DIAGNOSIS — C61 Malignant neoplasm of prostate: Secondary | ICD-10-CM

## 2023-12-01 DIAGNOSIS — Z79818 Long term (current) use of other agents affecting estrogen receptors and estrogen levels: Secondary | ICD-10-CM | POA: Diagnosis not present

## 2023-12-01 LAB — CBC WITH DIFFERENTIAL (CANCER CENTER ONLY)
Abs Immature Granulocytes: 0.02 K/uL (ref 0.00–0.07)
Basophils Absolute: 0 K/uL (ref 0.0–0.1)
Basophils Relative: 0 %
Eosinophils Absolute: 0 K/uL (ref 0.0–0.5)
Eosinophils Relative: 0 %
HCT: 34.6 % — ABNORMAL LOW (ref 39.0–52.0)
Hemoglobin: 11.7 g/dL — ABNORMAL LOW (ref 13.0–17.0)
Immature Granulocytes: 1 %
Lymphocytes Relative: 18 %
Lymphs Abs: 0.6 K/uL — ABNORMAL LOW (ref 0.7–4.0)
MCH: 31.3 pg (ref 26.0–34.0)
MCHC: 33.8 g/dL (ref 30.0–36.0)
MCV: 92.5 fL (ref 80.0–100.0)
Monocytes Absolute: 1.2 K/uL — ABNORMAL HIGH (ref 0.1–1.0)
Monocytes Relative: 39 %
Neutro Abs: 1.3 K/uL — ABNORMAL LOW (ref 1.7–7.7)
Neutrophils Relative %: 42 %
Platelet Count: 102 K/uL — ABNORMAL LOW (ref 150–400)
RBC: 3.74 MIL/uL — ABNORMAL LOW (ref 4.22–5.81)
RDW: 16.5 % — ABNORMAL HIGH (ref 11.5–15.5)
WBC Count: 3.1 K/uL — ABNORMAL LOW (ref 4.0–10.5)
nRBC: 0 % (ref 0.0–0.2)

## 2023-12-01 LAB — CMP (CANCER CENTER ONLY)
ALT: 20 U/L (ref 0–44)
AST: 23 U/L (ref 15–41)
Albumin: 4.2 g/dL (ref 3.5–5.0)
Alkaline Phosphatase: 116 U/L (ref 38–126)
Anion gap: 9 (ref 5–15)
BUN: 21 mg/dL (ref 8–23)
CO2: 23 mmol/L (ref 22–32)
Calcium: 9.3 mg/dL (ref 8.9–10.3)
Chloride: 103 mmol/L (ref 98–111)
Creatinine: 0.81 mg/dL (ref 0.61–1.24)
GFR, Estimated: >60 mL/min (ref >60)
Glucose, Bld: 104 mg/dL — ABNORMAL HIGH (ref 70–99)
Potassium: 4.4 mmol/L (ref 3.5–5.1)
Sodium: 135 mmol/L (ref 135–145)
Total Bilirubin: 1 mg/dL (ref 0.0–1.2)
Total Protein: 6.8 g/dL (ref 6.5–8.1)

## 2023-12-01 LAB — PSA: Prostatic Specific Antigen: 0.25 ng/mL (ref 0.00–4.00)

## 2023-12-01 NOTE — Assessment & Plan Note (Signed)
 11/10/23 switch to long acting ADT - Eligard , next due in April 2026

## 2023-12-01 NOTE — Progress Notes (Signed)
 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  Indeterminate. Right supraclavicular lymph node biopsy confirmed metastatic adenocarcinoma consistent with prostate origin. On androgen deprivation therapy with Firmagon  Labs are reviewed and discussed with patient. PSA has responded well He tolerates Darolutamide  600mg  BID.  Continue current regimen.  I plan to repeat PSMA PET once PSA reaches nadir Referred to palliative care service.  Encounter for monitoring androgen deprivation therapy 11/10/23 switch to long acting ADT - Eligard , next due in April 2026  Thrombocytopenia Mild and count is above 100K.  Recommend patient to take empiric B12 supplementation.  Repeat labs and check B12, folate in 3 months.     Orders Placed This Encounter  Procedures   CBC with Differential (Cancer Center Only)    Standing Status:   Future    Expected Date:   02/07/2024    Expiration Date:   05/07/2024   CMP (Cancer Center only)    Standing Status:   Future    Expected Date:   02/07/2024    Expiration Date:   05/07/2024   PSA    Standing Status:   Future    Expected Date:   02/07/2024    Expiration Date:   05/07/2024   Vitamin B12    Standing Status:   Future    Expected Date:   02/07/2024    Expiration Date:   05/07/2024   Folate    Standing Status:   Future     Expected Date:   02/07/2024    Expiration Date:   05/07/2024   Immature Platelet Fraction    Standing Status:   Future    Expected Date:   02/07/2024    Expiration Date:   05/07/2024   Follow-up in 3 months. We spent sufficient time to discuss many aspect of care, questions were answered to patient's satisfaction.  All questions were answered. The patient knows to call the clinic with any problems, questions or concerns.  Call Babara, MD, PhD [facility] Hematology Oncology 12/01/2023    HISTORY OF PRESENTING ILLNESS:  Leonard Stokes 78 y.o. male presents to establish care for prostate cancer I have reviewed his chart and materials related to his cancer extensively and collaborated history with the patient. Summary of oncologic history is as follows: Oncology History  Prostate cancer metastatic to multiple sites [facility])  06/03/2017 Imaging   CT abdomen pelvis showed 1. Development of bilateral pelvic sidewall adenopathy, consistent with nodal metastasis. 2.  Aortic Atherosclerosis (ICD10-I70.0). 3. Right-sided urinary bladder entering a right inguinal hernia, similar. 4. 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 At that time, biopsy showed Gleason 4+5 and 4+4 involving all cores on the right up to 96% of the prostate.  Staging indicated bilateral external iliac lymphadenopathy up to 3 cm on the left and  1.8 cm on the right.  Bone scan was negative.  Per neurology note, surgery option was discussed and patient declined.  Patient was started on adjuvant deprivation therapy, status post EBRT and he completed 2.5 years of ADT.  PSA had remained undetectable until 08/29/2023-PSA has increased to 66.    09/01/2023 Imaging   Patient went to emergency room for evaluation of constipation and abdominal/lower back pain.  As part of the workup, patient had a CT chest  abdomen pelvis done.  CT chest showed mildly enlarged bilateral hilar lymph node which are indeterminate.  CT abdomen pelvis with contrast showed Significantly enlarged left periaortic adenopathy, most prominently seen in the left periaortic region, bilateral nonobstructive nephrolithiasis, sigmoid diverticulosis without inflammation   09/05/2023 Cancer Staging   Staging form: Prostate, AJCC 8th Edition - Clinical stage from 09/05/2023: Stage IVB (rcTX, rcN1, rcM1, PSA: 75) - Signed by Babara Call, MD on 09/06/2023 Stage prefix: Recurrence Prostate specific antigen (PSA) range: 20 or greater   09/06/2023 Imaging   PSMA PET scan showed   1. Widespread tracer-avid osseous metastatic disease without corresponding sclerotic bone lesions. New from the prior PET-CT. 2. Multiple tracer-avid thoracic, abdominal, and pelvic lymph nodes. New from prior PET-CT. 3. Heterogeneous hepatic uptake with multiple geographic areas of relative increased radiotracer uptake, indeterminate for infiltrative hepatocellular disease. No discrete tracer-avid lesion identified. No suspicious liver lesions on CT from 09/01/2023.   09/09/2023 Procedure   Left supraclavicular lymph node biopsy showed metastatic adenocarcinoma.  Additional IHC staining showed the adenocarcinoma is positive with NKX3.1 and prostein while negative with prostate-specific antigen (PSA).  The immunohistochemical findings are consistent with metastatic prostate adenocarcinoma      09/20/23 started on Darolutamide  600mg  BID. Tolerated well.  He reports feeling well at baseline.   Gait instability and physical strength has improved with physical therapy. Pain has resolved. He sometimes experiences right shoulder locking sensation.     MEDICAL HISTORY:  Past Medical History:  Diagnosis Date   Anemia    Arthritis    Atherosclerosis of aorta 2018   BPH (benign prostatic hyperplasia)    Cancer (HCC) 2019   prostate   Chronic kidney disease  08/2016   kidney stone   Diverticulosis of sigmoid colon    History of kidney stones    Hypertension    Leg cramps     SURGICAL HISTORY: Past Surgical History:  Procedure Laterality Date   APPENDECTOMY     EXTRACORPOREAL SHOCK WAVE LITHOTRIPSY Right 08/19/2016   Procedure: EXTRACORPOREAL SHOCK WAVE LITHOTRIPSY (ESWL);  Surgeon: Penne Knee, MD;  Location: [facility] ORS;  Service: Urology;  Laterality: Right;   EXTRACORPOREAL SHOCK WAVE LITHOTRIPSY Left 03/24/2017   Procedure: EXTRACORPOREAL SHOCK WAVE LITHOTRIPSY (ESWL);  Surgeon: Penne Knee, MD;  Location: [facility] ORS;  Service: Urology;  Laterality: Left;   EYE MUSCLE SURGERY     INGUINAL HERNIA REPAIR Right 12/23/2017   Direct hernia, medium Ultra Pro mesh;  Surgeon: Dessa Reyes ORN, MD;  Location: [facility] ORS;  Service: General;  Laterality: Right;   JOINT REPLACEMENT     KNEE ARTHROPLASTY Left 11/15/2016   Procedure: COMPUTER ASSISTED TOTAL KNEE ARTHROPLASTY;  Surgeon: Mardee Lynwood SQUIBB, MD;  Location: [facility] ORS;  Service: Orthopedics;  Laterality: Left;   TOTAL KNEE ARTHROPLASTY Right     SOCIAL HISTORY: Social History   Socioeconomic History   Marital status: Married    Spouse name: Not on file   Number of children: Not on file   Years of education: Not  on file   Highest education level: Not on file  Occupational History   Not on file  Tobacco Use   Smoking status: Former    Current packs/day: 0.00    Average packs/day: 3.0 packs/day for 20.0 years (60.0 ttl pk-yrs)    Types: Cigarettes    Start date: 05/31/1976    Quit date: 05/31/1996    Years since quitting: 27.5   Smokeless tobacco: Never  Vaping Use   Vaping status: Never Used  Substance and Sexual Activity   Alcohol use: Yes    Comment: 4-5 week   Drug use: No   Sexual activity: Yes  Other Topics Concern   Not on file  Social History Narrative   ** Merged History Encounter **       Social Drivers of Health   Financial Resource Strain: Patient  Declined (10/21/2023)   Received from [facility]   Overall Financial Resource Strain (CARDIA)    Difficulty of Paying Living Expenses: Patient declined  Food Insecurity: Patient Declined (10/21/2023)   Received from [facility]   Hunger Vital Sign    Within the past 12 months, you worried that your food would run out before you got the money to buy more.: Patient declined    Within the past 12 months, the food you bought just didn't last and you didn't have money to get more.: Patient declined  Transportation Needs: Patient Declined (10/21/2023)   Received from [facility] - Transportation    In the past 12 months, has lack of transportation kept you from medical appointments or from getting medications?: Patient declined    Lack of Transportation (Non-Medical): Patient declined  Physical Activity: Not on file  Stress: No Stress Concern Present (09/05/2023)   [employer] - Occupational Stress Questionnaire    Feeling of Stress: Not at all  Social Connections: Not on file  Intimate Partner Violence: Not At Risk (09/05/2023)   Humiliation, Afraid, Rape, and Kick questionnaire    Fear of Current or Ex-Partner: No    Emotionally Abused: No    Physically Abused: No    Sexually Abused: No    FAMILY HISTORY: History reviewed. No pertinent family history.  ALLERGIES:  is allergic to codeine.  MEDICATIONS:  Current Outpatient Medications  Medication Sig Dispense Refill   acetaminophen  (TYLENOL ) 500 MG tablet Take 1,000 mg by mouth every 6 (six) hours as needed for moderate pain.      Ascorbic Acid  (VITAMIN C ) 1000 MG tablet Take 1,000 mg by mouth daily.     atorvastatin (LIPITOR) 20 MG tablet Take by mouth.     B Complex Vitamins (B COMPLEX-B12 PO) Take 1 tablet by mouth daily.     Calcium Acetate, Phos Binder, (CALCIUM ACETATE PO) Take 2 tablets by mouth daily.     Cholecalciferol  (VITAMIN D-3) 125  MCG (5000 UT) TABS Take 5,000 Units by mouth daily.     cyanocobalamin 1000 MCG tablet Take by mouth.     cyclobenzaprine  (FLEXERIL ) 10 MG tablet Take 1 tablet (10 mg total) by mouth 3 (three) times daily as needed for muscle spasms. 30 tablet 0   darolutamide  (NUBEQA ) 300 MG tablet Take 2 tablets (600 mg total) by mouth 2 (two) times daily with a meal. 120 tablet 1   docusate sodium  (COLACE) 100 MG capsule Take 1 capsule (100 mg total) by mouth 2 (two) times daily. 60 capsule 2   famotidine  (PEPCID )  20 MG tablet Take 20 mg by mouth at bedtime.     ferrous sulfate  325 (65 FE) MG tablet Take 325 mg by mouth daily with breakfast.     finasteride  (PROSCAR ) 5 MG tablet TAKE ONE TABLET (5 MG) BY MOUTH EVERY OTHER DAY 30 tablet 11   ipratropium (ATROVENT) 0.03 % nasal spray SMARTSIG:1-2 Spray(s) Both Nares 3 Times Daily PRN     losartan -hydrochlorothiazide  (HYZAAR) 50-12.5 MG tablet Take 1 tablet by mouth every morning.      meloxicam (MOBIC) 15 MG tablet Take 1 tablet by mouth daily.     methocarbamol (ROBAXIN) 500 MG tablet Take 500 mg by mouth.     Misc Natural Products (OSTEO BI-FLEX TRIPLE STRENGTH PO) Take 1 tablet by mouth daily.      Multiple Vitamins-Minerals (SENIOR MULTIVITAMIN PLUS PO) Take 1 tablet by mouth daily.     NONFORMULARY OR COMPOUNDED ITEM Trimix (30/1/10)-(Pap/Phent/PGE)  Test Dose  1ml vial   Qty #3 Refills 0  [facility] [phone] Fax [phone] 3 each 0   oxyCODONE  (ROXICODONE ) 5 MG immediate release tablet Take 1 tablet (5 mg total) by mouth every 8 (eight) hours as needed for severe pain (pain score 7-10) or breakthrough pain. 30 tablet 0   polyethylene glycol (MIRALAX ) 17 g packet Take 17 g by mouth daily. 30 packet 0   tamsulosin  (FLOMAX ) 0.4 MG CAPS capsule TAKE ONE CAPSULE DAILY 30 MINUTES AFTER THE SAME MEAL EACH DAY 30 capsule 11   Vibegron (GEMTESA) 75 MG TABS Take 1 tablet (75 mg total) by mouth daily.     vitamin E 180 MG (400 UNITS) capsule  Take by mouth.     zolpidem  (AMBIEN ) 10 MG tablet Take 10 mg by mouth at bedtime as needed for sleep.      hydrochlorothiazide  (HYDRODIURIL ) 12.5 MG tablet  (Patient not taking: Reported on 12/01/2023)     No current facility-administered medications for this visit.    Review of Systems  Constitutional:  Positive for fatigue. Negative for appetite change, chills, fever and unexpected weight change.  HENT:   Negative for hearing loss and voice change.   Eyes:  Negative for eye problems and icterus.  Respiratory:  Negative for chest tightness, cough and shortness of breath.   Cardiovascular:  Negative for chest pain and leg swelling.  Gastrointestinal:  Negative for abdominal distention and abdominal pain.  Endocrine: Negative for hot flashes.  Genitourinary:  Negative for difficulty urinating, dysuria and frequency.   Musculoskeletal:  Positive for arthralgias and gait problem.  Skin:  Negative for itching and rash.  Neurological:  Positive for gait problem. Negative for light-headedness and numbness.  Hematological:  Negative for adenopathy. Does not bruise/bleed easily.  Psychiatric/Behavioral:  Negative for confusion.      PHYSICAL EXAMINATION: ECOG PERFORMANCE STATUS: 1 - Symptomatic but completely ambulatory  Vitals:   12/01/23 1110  BP: 120/69  Pulse: 71  Resp: 18  Temp: (!) 97.1 F (36.2 C)   Filed Weights   12/01/23 1110  Weight: 167 lb 11.2 oz (76.1 kg)    Physical Exam Constitutional:      General: He is not in acute distress.    Appearance: He is not diaphoretic.  HENT:     Head: Normocephalic and atraumatic.  Eyes:     General: No scleral icterus. Cardiovascular:     Rate and Rhythm: Normal rate.  Pulmonary:     Effort: Pulmonary effort is normal. No respiratory distress.  Breath sounds: Normal breath sounds.  Abdominal:     General: There is no distension.     Palpations: Abdomen is soft.     Tenderness: There is no abdominal tenderness.   Musculoskeletal:        General: Normal range of motion.     Cervical back: Normal range of motion and neck supple.  Skin:    General: Skin is warm and dry.     Findings: No erythema.  Neurological:     Mental Status: He is alert and oriented to person, place, and time. Mental status is at baseline.     Motor: No abnormal muscle tone.     Coordination: Coordination normal.  Psychiatric:        Mood and Affect: Mood and affect normal.      LABORATORY DATA:  I have reviewed the data as listed    Latest Ref Rng & Units 12/01/2023   10:40 AM 11/03/2023    9:47 AM 10/05/2023    8:20 AM  CBC  WBC 4.0 - 10.5 K/uL 3.1  4.5  8.1   Hemoglobin 13.0 - 17.0 g/dL 88.2  89.2  89.6   Hematocrit 39.0 - 52.0 % 34.6  32.6  31.9   Platelets 150 - 400 K/uL 102  117  214       Latest Ref Rng & Units 12/01/2023   10:40 AM 11/03/2023    9:47 AM 10/05/2023    8:20 AM  CMP  Glucose 70 - 99 mg/dL 895  897  881   BUN 8 - 23 mg/dL 21  19  18    Creatinine 0.61 - 1.24 mg/dL 9.18  9.31  9.24   Sodium 135 - 145 mmol/L 135  136  136   Potassium 3.5 - 5.1 mmol/L 4.4  4.8  4.4   Chloride 98 - 111 mmol/L 103  104  103   CO2 22 - 32 mmol/L 23  25  25    Calcium 8.9 - 10.3 mg/dL 9.3  9.3  9.0   Total Protein 6.5 - 8.1 g/dL 6.8  6.8  6.6   Total Bilirubin 0.0 - 1.2 mg/dL 1.0  0.9  0.6   Alkaline Phos 38 - 126 U/L 116  148  266   AST 15 - 41 U/L 23  25  26    ALT 0 - 44 U/L 20  25  26       RADIOGRAPHIC STUDIES: I have personally reviewed the radiological images as listed and agreed with the findings in the report. US  CORE BIOPSY (LYMPH NODES) Result Date: 09/09/2023 INDICATION: 78 year old with prostate cancer. Recent PET-CT suggests widespread metastatic disease. Patient presents for ultrasound-guided lymph node biopsy. EXAM: ULTRASOUND-GUIDED CORE BIOPSY OF LEFT SUPRACLAVICULAR LYMPH NODE MEDICATIONS: 1% lidocaine  for local anesthetic ANESTHESIA/SEDATION: None FLUOROSCOPY TIME:  None COMPLICATIONS: None  immediate. PROCEDURE: Informed written consent was obtained from the patient after a thorough discussion of the procedural risks, benefits and alternatives. All questions were addressed. A timeout was performed prior to the initiation of the procedure. Ultrasound was used to identify enlarged and abnormal left supraclavicular lymph nodes. A left supraclavicular lymph node was targeted for biopsy. The left lower neck was prepped with chlorhexidine  and sterile field was created. Skin was anesthetized with 1% lidocaine . Small incision was made. Using ultrasound guidance, an 18 gauge core needle was directed into the lymph node and a total of 5 core biopsies were obtained. Specimens placed on a Telfa pad with saline. Bandage placed over  the puncture site. Ultrasound images were taken and saved for this procedure. FINDINGS: Enlarged and abnormal left supraclavicular lymph nodes. Most accessible lymph node was biopsied. Five adequate specimens obtained. No immediate bleeding or hematoma formation. IMPRESSION: Ultrasound-guided core biopsy of a left supraclavicular lymph node. Electronically Signed   By: Juliene Balder M.D.   On: 09/09/2023 15:30   NM PET (PSMA) SKULL TO MID THIGH Addendum Date: 09/09/2023 ADDENDUM #1 EXAM: PROSTATE PET SKULL BASE TO MID THIGHS 09/06/2023 02:38:55 PM TECHNIQUE: PET imaging was obtained from skull vertex to mid thighs. Computed tomography was used for attenuation correction and localization. Fusion imaging was obtained. RADIOPHARMACEUTICAL: 8.65 mCi F-18 flotufolastaf (Posluma ) injected intravenously. COMPARISON: 06/24/2022 CLINICAL HISTORY: Prostate cancer, residual or recurrent disease suspected. No recent biopsy. Diagnosed 2019. Went thru chemo/rad therapy 2019. Lupron  injection. Current PSA 75.44. FINDINGS: PROSTATE AND PROSTATE BED: Normal low-level 75F-FDG uptake within the prostate gland without focal area of hypermetabolism. SUV max within the prostate measures 3.0 cm, axial image  152. LYMPH NODES: Multiple tracer avid thoracic lymph nodes are identified. Index left supraclavicular node measures 7 mm with SUV max of 16.9, axial image 38. Index lower left paratracheal lymph node measures 6 mm with SUV max of 15.4. The index right hilar lymph node has an SUV max of 12.6. Multiple tracer-avid abdominal and pelvic lymph nodes are identified. Index left retroperitoneal lymph node measures 1.2 cm with SUV max of 19.6, axial image 97. Index retrocaval lymph node measures 1.24 cm with SUV max of 20.23, axial image 103. Left posterior pelvic lymph node measures 0.8 cm with SUV max of 10.7. BONES: Widespread tracer-avid osseous metastatic disease identified without corresponding changes on the CT images. Index lesion within the right scapula has an SUV max of 19.1, axial image 46. Index lesion within the posterior aspect of the L4 vertebra has an SUV max of 30, axial image 116. Index lesion in the right iliac bone has an SUV max of 23.6, axial image 11/29. OTHER PET FINDINGS: There is heterogeneous uptake throughout both lobes of the liver with multiple geographic areas of relative increased radiotracer uptake. Significance of this is indeterminate. No discrete tracer avid lesion identified although underlying infiltrative hepatocellular disease cannot be excluded. On the CT from 09/01/2023, there were no suspicious liver lesions. No tracer avid pulmonary nodules. Aortic atherosclerosis and coronary artery calcifications. Penile prosthesis is identified. IMPRESSION: 1. Widespread tracer-avid osseous metastatic disease without corresponding sclerotic bone lesions. New from the prior PET-CT. 2. Multiple tracer-avid thoracic, abdominal, and pelvic lymph nodes. New from prior PET-CT. 3. Heterogeneous hepatic uptake with multiple geographic areas of relative increased radiotracer uptake, indeterminate for infiltrative hepatocellular disease. No discrete tracer-avid lesion identified. No suspicious liver  lesions on CT from 09/01/2023. Electronically signed by: Waddell Calk MD 09/09/2023 08:42 AM EDT RP Workstation: [id number]   Result Date: 09/09/2023 ORIGINAL REPORT  EXAM: PROSTATE PET SKULL BASE TO MID THIGHS 09/06/2023 02:38:55 PM TECHNIQUE: PET imaging was obtained from skull vertex to mid thighs. Computed tomography was used for attenuation correction and localization. Fusion imaging was obtained. RADIOPHARMACEUTICAL: 8.65 mCi F-18 flotufolastaf (Posluma ) injected intravenously. COMPARISON: 06/24/2022 CLINICAL HISTORY: Prostate cancer, residual or recurrent disease suspected. No recent biopsy. Diagnosed 2019. Went thru chemo/rad therapy 2019. Lupron  injection. Current PSA 75.44. FINDINGS: PROSTATE AND PROSTATE BED: Normal low-level 75F-FDG uptake within the prostate gland without focal area of hypermetabolism. SUV max within the prostate measures 3.0 cm, axial image 152. LYMPH NODES: Multiple tracer rapid thoracic lymph nodes are identified. Index  right supraclavicular node measures 7 mm with SUV max of 16.9, axial image 38. Index lower left paratracheal lymph node measures 6 mm with SUV max of 15.4. The index right hilar lymph node has an SUV max of 12.6. Multiple tracer-avid abdominal and pelvic lymph nodes are identified. Index left retroperitoneal lymph node measures 1.2 cm with SUV max of 19.6, axial image 97. Index retrocaval lymph node measures 1.24 cm with SUV max of 20.23, axial image 103. Left posterior pelvic lymph node measures 0.8 cm with SUV max of 10.7. BONES: Widespread tracer-avid osseous metastatic disease identified without corresponding changes on the CT images. Index lesion within the right scapula has an SUV max of 19.1, axial image 46. Index lesion within the posterior aspect of the L4 vertebra has an SUV max of 30, axial image 116. Index lesion in the right iliac bone has an SUV max of 23.6, axial image 11/29. OTHER PET FINDINGS: There is heterogeneous uptake throughout both lobes of  the liver with multiple geographic areas of relative increased radiotracer uptake. Significance of this is indeterminate. No discrete tracer avid lesion identified although underlying infiltrative hepatocellular disease cannot be excluded. On the CT from 09/01/2023, there were no suspicious liver lesions. No tracer avid pulmonary nodules. Aortic atherosclerosis and coronary artery calcifications. Penile prosthesis is identified. IMPRESSION: 1. Widespread tracer-avid osseous metastatic disease without corresponding sclerotic bone lesions. New from the prior PET-CT. 2. Multiple tracer-avid thoracic, abdominal, and pelvic lymph nodes. New from prior PET-CT. 3. Heterogeneous hepatic uptake with multiple geographic areas of relative increased radiotracer uptake, indeterminate for infiltrative hepatocellular disease. No discrete tracer-avid lesion identified. No suspicious liver lesions on CT from 09/01/2023. Electronically signed by: Waddell Calk MD 09/06/2023 04:09 PM EDT RP Workstation: [id number]   CT Chest W Contrast Result Date: 09/01/2023 CLINICAL DATA:  left posterior rib pain. History of prostate carcinoma. * Tracking Code: BO * EXAM: CT CHEST WITH CONTRAST TECHNIQUE: Multidetector CT imaging of the chest was performed during intravenous contrast administration. RADIATION DOSE REDUCTION: This exam was performed according to the departmental dose-optimization program which includes automated exposure control, adjustment of the mA and/or kV according to patient size and/or use of iterative reconstruction technique. CONTRAST:  75mL OMNIPAQUE  IOHEXOL  300 MG/ML  SOLN COMPARISON:  CT scan chest from 10/02/2014 and PET-CT scan from 06/23/2017. FINDINGS: Cardiovascular: Normal cardiac size. No pericardial effusion. No aortic aneurysm. There are coronary artery calcifications, in keeping with coronary artery disease. There are also mild-to-moderate peripheral atherosclerotic vascular calcifications of thoracic aorta  and its major branches. Mediastinum/Nodes: Visualized thyroid  gland appears grossly unremarkable. No solid / cystic mediastinal masses. The esophagus is nondistended precluding optimal assessment. There are few mildly enlarged bilateral hilar lymph nodes with largest in the right hilum measuring up to 10 x 17 mm. These are indeterminate in etiology. No axillary or mediastinal lymphadenopathy by size criteria. Lungs/Pleura: The central tracheo-bronchial tree is patent. There are patchy areas of linear, plate-like atelectasis and/or scarring throughout bilateral lungs. No mass or consolidation. No pleural effusion or pneumothorax. No suspicious lung nodules. There are multiple sub 5 mm noncalcified opacities in the minor fissure (marked with electronic arrow sign on series 2005), favored to represent intra fissural lymph nodes. No suspicious lung nodules. Upper Abdomen: There is left para-aortic conglomerate nodal mass measuring up to 2.4 x 3.1 cm. There are additional retroperitoneal lymph nodes as well. Please refer to same-day acquired CT scan abdomen and pelvis report for additional details. Remaining visualized upper abdominal viscera within normal limits.  Musculoskeletal: The visualized soft tissues of the chest wall are grossly unremarkable. No suspicious osseous lesions. There are mild multilevel degenerative changes in the visualized spine. IMPRESSION: 1. There are few mildly enlarged bilateral hilar lymph nodes, which are indeterminate in etiology. There are enlarged upper abdominal lymph nodes as well, better evaluated on the same-day acquired CT scan abdomen and pelvis. 2. Otherwise, no metastatic disease identified within the chest. 3. Multiple other nonacute observations, as described above. Aortic Atherosclerosis (ICD10-I70.0). Electronically Signed   By: Ree Molt M.D.   On: 09/01/2023 15:05   CT ABDOMEN PELVIS W CONTRAST Result Date: 09/01/2023 CLINICAL DATA:  Acute lower abdominal pain.  History of prostate cancer. EXAM: CT ABDOMEN AND PELVIS WITH CONTRAST TECHNIQUE: Multidetector CT imaging of the abdomen and pelvis was performed using the standard protocol following bolus administration of intravenous contrast. RADIATION DOSE REDUCTION: This exam was performed according to the departmental dose-optimization program which includes automated exposure control, adjustment of the mA and/or kV according to patient size and/or use of iterative reconstruction technique. CONTRAST:  OMNIPAQUE  IOHEXOL  300 MG/ML  SOLN COMPARISON:  Jun 03, 2017. FINDINGS: Lower chest: No acute abnormality. Hepatobiliary: Grossly stable low densities are noted in the hepatic parenchyma most consistent with cysts. No gallstones, gallbladder wall thickening, or biliary dilatation. Pancreas: Unremarkable. No pancreatic ductal dilatation or surrounding inflammatory changes. Spleen: Normal in size without focal abnormality. Adrenals/Urinary Tract: Adrenal glands are unremarkable. Bilateral nonobstructive nephrolithiasis. No hydronephrosis or renal obstruction is noted. Bladder is unremarkable. Stomach/Bowel: The stomach is unremarkable. Status post appendectomy. No evidence of bowel obstruction or inflammation. Sigmoid diverticulosis without inflammation. Moderate amount of stool seen throughout the colon. Vascular/Lymphatic: Aortic atherosclerosis. Significantly enlarged periaortic adenopathy is noted compared with prior exam. This is most prominently seen in the left periaortic region, with the largest conglomerate measuring 2.5 x 2.0 cm. This is consistent with metastatic disease. Reproductive: Prostate is unremarkable in size. Penile implant reservoir is noted anteriorly in left side of pelvis. Other: No ascites or hernia is noted. Musculoskeletal: Minimal grade 1 anterolisthesis is noted secondary to bilateral L5 spondylolysis. IMPRESSION: Significantly enlarged periaortic adenopathy is noted compared to prior exam, most  prominently seen in the left periaortic region, consistent with worsening metastatic disease. Bilateral nonobstructive nephrolithiasis. Sigmoid diverticulosis without inflammation. Aortic Atherosclerosis (ICD10-I70.0). Electronically Signed   By: Lynwood Landy Raddle M.D.   On: 09/01/2023 12:22

## 2023-12-01 NOTE — Assessment & Plan Note (Signed)
 Mild and count is above 100K.  Recommend patient to take empiric B12 supplementation.  Repeat labs and check B12, folate in 3 months.

## 2023-12-01 NOTE — Assessment & Plan Note (Addendum)
 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  Indeterminate. Right supraclavicular lymph node biopsy confirmed metastatic adenocarcinoma consistent with prostate origin. On androgen deprivation therapy with Firmagon  Labs are reviewed and discussed with patient. PSA has responded well He tolerates Darolutamide  600mg  BID.  Continue current regimen.  I plan to repeat PSMA PET once PSA reaches nadir Referred to palliative care service.

## 2023-12-02 ENCOUNTER — Encounter: Payer: Self-pay | Admitting: Oncology

## 2023-12-05 ENCOUNTER — Ambulatory Visit: Attending: Oncology

## 2023-12-05 DIAGNOSIS — R2681 Unsteadiness on feet: Secondary | ICD-10-CM | POA: Diagnosis not present

## 2023-12-05 DIAGNOSIS — Z9181 History of falling: Secondary | ICD-10-CM | POA: Insufficient documentation

## 2023-12-05 DIAGNOSIS — M6281 Muscle weakness (generalized): Secondary | ICD-10-CM | POA: Diagnosis not present

## 2023-12-05 NOTE — Therapy (Signed)
 OUTPATIENT PHYSICAL THERAPY TREATMENT And Progress Report (10/20/2023 - 11/30/2023)   Patient Name: Leonard Stokes MRN: [id number] DOB:1945-08-13, 78 y.o., male Today's Date: 12/05/2023  END OF SESSION:  PT End of Session - 12/05/23 1520     Visit Number 11    Number of Visits 17    Date for Recertification  12/16/23    PT Start Time 1520    PT Stop Time 1600    PT Time Calculation (min) 40 min    Activity Tolerance Patient tolerated treatment well    Behavior During Therapy [facility] for tasks assessed/performed                    Past Medical History:  Diagnosis Date   Anemia    Arthritis    Atherosclerosis of aorta 2018   BPH (benign prostatic hyperplasia)    Cancer (HCC) 2019   prostate   Chronic kidney disease 08/2016   kidney stone   Diverticulosis of sigmoid colon    History of kidney stones    Hypertension    Leg cramps    Past Surgical History:  Procedure Laterality Date   APPENDECTOMY     EXTRACORPOREAL SHOCK WAVE LITHOTRIPSY Right 08/19/2016   Procedure: EXTRACORPOREAL SHOCK WAVE LITHOTRIPSY (ESWL);  Surgeon: Penne Knee, MD;  Location: [facility] ORS;  Service: Urology;  Laterality: Right;   EXTRACORPOREAL SHOCK WAVE LITHOTRIPSY Left 03/24/2017   Procedure: EXTRACORPOREAL SHOCK WAVE LITHOTRIPSY (ESWL);  Surgeon: Penne Knee, MD;  Location: [facility] ORS;  Service: Urology;  Laterality: Left;   EYE MUSCLE SURGERY     INGUINAL HERNIA REPAIR Right 12/23/2017   Direct hernia, medium Ultra Pro mesh;  Surgeon: Dessa Reyes ORN, MD;  Location: [facility] ORS;  Service: General;  Laterality: Right;   JOINT REPLACEMENT     KNEE ARTHROPLASTY Left 11/15/2016   Procedure: COMPUTER ASSISTED TOTAL KNEE ARTHROPLASTY;  Surgeon: Mardee Lynwood SQUIBB, MD;  Location: [facility] ORS;  Service: Orthopedics;  Laterality: Left;   TOTAL KNEE ARTHROPLASTY Right    Patient Active Problem List   Diagnosis Date Noted   Thrombocytopenia 12/01/2023   Mood swing 10/05/2023   Gait instability  10/05/2023   Encounter for monitoring androgen deprivation therapy 09/15/2023   Neoplasm related pain 09/15/2023   Prostate cancer metastatic to multiple sites [facility]) 12/01/2017   Right inguinal hernia 04/15/2017   Hypertension 11/30/2016   S/P total knee arthroplasty 11/15/2016   Personal history of tobacco use, presenting hazards to health 09/26/2014    PCP: Diedra Lame, MD   REFERRING PROVIDER: Babara Call, MD   REFERRING DIAG: C61 (ICD-10-CM) - Prostate cancer metastatic to multiple sites (HCC) R53.1 (ICD-10-CM) - Weakness  Rationale for Evaluation and Treatment: Rehabilitation  THERAPY DIAG:  Unsteadiness on feet  Muscle weakness (generalized)  History of falling  ONSET DATE: 10/05/2023 (Date PT referral signed)  SUBJECTIVE:  SUBJECTIVE STATEMENT: Oncology appointment went well. Has been stretching his calf muscle. Going up his stairs wipes him out.       PERTINENT HISTORY:  Weakness, decreased balance. Uneven surfaces causes him to lose balance. Also is unsteady when he first wakes up in the morning. Difficulty with balance started about 6-8 weeks ago which is worsening. Pt was cleaning up after his dog and lost balance going forward and could not get up. His PT neighbor helped him up. Had difficulty using his L LE to get himself up.    Blood pressure is controlled per pt.  No latex allergies.    No pain currently. Pain is primarily at night, around his abdominal area.   Standing up from a seated position such as a toilet, is very difficult, needs a grab bar assist.      PAIN:  Are you having pain? Yes: NPRS scale: none at the moment.  Pain location: abdominal area and some in the back, might be cancer related.  Pain description: achy Aggravating factors: unknown, does not  have it all the time.  Relieving factors: oxycodone   PRECAUTIONS: Fall and Other: CA  RED FLAGS: Bowel or bladder incontinence: No and Cauda equina syndrome: No   WEIGHT BEARING RESTRICTIONS: No  FALLS:  Has patient fallen in last 6 months? Yes. Number of falls 6, pt states catching himself most of the time. Did not really fall, he just loss balance. Uneven surfaces causes him to lose balance  LIVING ENVIRONMENT: Lives with: lives with their spouse Lives in: House/apartment Stairs: Yes: Internal: 14 steps; on right going up and External: 2 steps; on right going up Has following equipment at home: Single point cane, Walker - 4 wheeled, and Grab bars  OCCUPATION: retired  PLOF: Independent  PATIENT GOALS: getting up from the chair and toilet, and improve B LE strength L > R  NEXT MD VISIT: about 1-2 weeks from now.  OBJECTIVE:  Note: Objective measures were completed at Evaluation unless otherwise noted.  DIAGNOSTIC FINDINGS:    PATIENT SURVEYS:  ABC scale: The Activities-Specific Balance Confidence (ABC) Scale 0% 10 20 30  40 50 60 70 80 90 100% No confidence<->completely confident  "How confident are you that you will not lose your balance or become unsteady when you . . .   Date tested 10/20/2023  Walk around the house 50%  2. Walk up or down stairs 50%  3. Bend over and pick up a slipper from in front of a closet floor 0%  4. Reach for a small can off a shelf at eye level 100%  5. Stand on tip toes and reach for something above your head 50%  6. Stand on a chair and reach for something 30%  7. Sweep the floor 60%  8. Walk outside the house to a car parked in the driveway 100%  9. Get into or out of a car 100%  10. Walk across a parking lot to the mall 80%  11. Walk up or down a ramp 80%  12. Walk in a crowded mall where people rapidly walk past you 100%  13. Are bumped into by people as you walk through the mall 80%  14. Step onto or off of an escalator while you  are holding onto the railing 100%  15. Step onto or off an escalator while holding onto parcels such that you cannot hold onto the railing 40%  16. Walk outside on icy sidewalks 0%  Total: #/16 1020%/16 =  63.75%     COGNITION: Overall cognitive status: Within functional limits for tasks assessed     SENSATION:   MUSCLE LENGTH:   POSTURE: wide base of support, hip and knee flexion, forward flexed posture, R hip ER,   PALPATION:   LUMBAR ROM:   AROM eval  Flexion in sitting  full  Extension WFL (LOB backwards in standing)  Right lateral flexion  WFL  Left lateral flexion WFL  Right rotation in sitting WFL  Left rotation in sitting WFL   (Blank rows = not tested)  LOWER EXTREMITY ROM:     Passive  Right 11/30/2023 Left 11/30/2023  Hip flexion    Hip extension    Hip abduction    Hip adduction    Hip internal rotation    Hip external rotation    Knee flexion    Knee extension    Ankle dorsiflexion -38 (-10 AAROM) -20 (-4 AAROM)  Ankle plantarflexion    Ankle inversion    Ankle eversion     (Blank rows = not tested)  LOWER EXTREMITY MMT:    MMT Right eval Left eval  Hip flexion 4 3-  Hip extension (seated manually resisted) 4- 4-  Hip abduction (seated manually resisted) 4 4  Hip adduction    Hip internal rotation    Hip external rotation    Knee flexion 4+ 4  Knee extension 5 4+  Ankle dorsiflexion    Ankle plantarflexion    Ankle inversion    Ankle eversion     (Blank rows = not tested)  LUMBAR SPECIAL TESTS:  (-) heel to shin test (+) finger to nose on L side.   Difficulty with rapid alternating palms up and down.   FUNCTIONAL TESTS:  Lars Balance Scale:  Item Test date: 10/20/2023 Date:  Date:   Sitting to standing 3. able to stand independently using hands Insert SmartPhrase OPRCBERGREEVAL Insert SmartPhrase OPRCBERGREEVAL  2. Standing unsupported 2. able to stand 30 seconds unsupported    3. Sitting with back unsupported, feet supported  4. able to sit safely and securely for 2 minutes    4. Standing to sitting 3. controls descent by using hands    5. Pivot transfer  3. able to transfer safely with definite need of hands    6. Standing unsupported with eyes closed 3. able to stand 10 seconds with supervision    7. Standing unsupported with feet together 3. able to place feet together independently and stand 1 minute with supervision    8. Reaching forward with outstretched arms while standing 2. can reach forward 5 cm (2 inches)    9. Pick up object from the floor from standing 3. able to pick up slipper but needs supervision    10. Turning to look behind over left and right shoulders while standing 1. needs supervision when turning    11. Turn 360 degrees 1. needs close supervision or verbal cuing    12. Place alternate foot on step or stool while standing unsupported 0. needs assistance to keep from falling/unable to try    13. Standing unsupported one foot in front 1. needs help to step but can hold 15 seconds    14. Standing on one leg 0. unable to try of needs assist to prevent fall      Total Score 29/56 Total Score:    Total Score:       Score Interpretation: Score of <19 indicates high risk of falls.  Minimally Clinically  Important Difference (MCID):  =DGI scores of<21/24 = 1.80 points DGI scores of >21/24 = 0.60 points   Triana T, Inbar-Borovsky N, Brozgol M, Giladi N, [place] JM. The Dynamic Gait Index in healthy older adults: the role of stair climbing, fear of falling and gender. Gait Posture. 2009 Feb;29(2):237-41. doi: 10.1016/j.gaitpost.2008.08.013. Epub 2008 Oct 8. PMID: 81154560; PMCID: EFR7290501.  Pardasaney, MYRTIS LOIS Bonus, GEANNIE POUR., et al. (2012). Sensitivity to change and responsiveness of four balance measures for community-dwelling older adults. Physical therapy 92(3): 388-397.   Item Test date: 11/28/2023 Date:  Date:   Sitting to standing 3. able to stand independently using hands Insert  SmartPhrase OPRCBERGREEVAL Insert SmartPhrase OPRCBERGREEVAL  2. Standing unsupported 3. able to stand 2 minutes with supervision    3. Sitting with back unsupported, feet supported 4. able to sit safely and securely for 2 minutes    4. Standing to sitting 4. sits safely with minimal use of hands    5. Pivot transfer  3. able to transfer safely with definite need of hands    6. Standing unsupported with eyes closed 3. able to stand 10 seconds with supervision    7. Standing unsupported with feet together 0. needs help to attain position and unable to hold for 15 seconds    8. Reaching forward with outstretched arms while standing 0. loses balance while trying/requires external support    9. Pick up object from the floor from standing 3. able to pick up slipper but needs supervision    10. Turning to look behind over left and right shoulders while standing 3. looks behind one side only, other side shows less weight shift    11. Turn 360 degrees 0. needs assistance while turning    12. Place alternate foot on step or stool while standing unsupported 0. needs assistance to keep from falling/unable to try    13. Standing unsupported one foot in front 0. loses balance while stepping or standing    14. Standing on one leg 0. unable to try of needs assist to prevent fall      Total Score 26/56 Total Score:    Total Score:      GAIT: Distance walked: 30 ft Assistive device utilized: Single point cane Level of assistance: Modified independence and SBA Comments: SPC on R, decreased stance R LE, forward flexed, decreased gait velocity.   TREATMENT DATE: 12/05/2023                                                                                                                               Therapeutic activities Performed with the intent of improving ability to perform standing tasks with less difficulty and less fall risk.   Reclined   Ankle DF stretch with tibialis anterior activation to promote  forward weight shifting and ankle strategy for balance.    R 10x5 seconds for 3 sets   L 10x5 seconds for 3 sets  Standing static  Mini lunge with contralateral UE assist   R 10x3   L 10x3  SLS with one UE assist   R 30 seconds for 3 sets  L 30 seconds for 3 sets  Forward step up onto first regular step with B UE   R 10x  L 10x   Improved technique, movement at target joints, use of target muscles after mod verbal, visual, tactile cues.       PATIENT EDUCATION:  Education details: POC Person educated: Patient Education method: Explanation Education comprehension: verbalized understanding  HOME EXERCISE PROGRAM: Access Code: [email or web address] URL: [email or web address] Date: 11/08/2023 Prepared by: Emil Glassman  Exercises - Seated Hip Flexion  - 1 x daily - 7 x weekly - 3 sets - 5 reps - Narrow Stance with Counter Support  - 3 x daily - 7 x weekly - 1 sets - 5 reps - 30 seconds hold - Standing Gastroc Stretch at Counter  - 3 x daily - 7 x weekly - 1 sets - 3 reps - 1 minute hold  - Standing Single Leg Stance with Counter Support  - 1 x daily - 7 x weekly - 3 sets - 10 reps - 5 seconds hold  - Seated Ankle Plantar Flexion with Resistance Loop  - 1 x daily - 7 x weekly - 3 sets - 10 reps  Blue band.  - Lunge with Counter Support  - 1 x daily - 7 x weekly - 3 sets - 10 reps    ASSESSMENT:  CLINICAL IMPRESSION:  Continued working on improving B ankle DF strength and ROM to improve forward weight shifting, ankle strategy and balance, as well as improving B LE functional strength for stair negotiation. Good muscle use observed with exercises.  Pt tolerated session well without aggravation of symptoms. Pt will benefit from continued skilled physical therapy services to improve strength, balance and function.    OBJECTIVE IMPAIRMENTS: Abnormal gait, decreased balance, difficulty walking, decreased strength, improper body mechanics, and postural dysfunction.    ACTIVITY LIMITATIONS: carrying, lifting, bending, standing, squatting, stairs, transfers, toileting, dressing, and locomotion level  PARTICIPATION LIMITATIONS:   PERSONAL FACTORS: Age, Fitness, Past/current experiences, Time since onset of injury/illness/exacerbation, and 3+ comorbidities: Cancer, HTN, arthritis are also affecting patient's functional outcome.   REHAB POTENTIAL: Fair    CLINICAL DECISION MAKING: Evolving/moderate complexity; balance is worsening per pt.   EVALUATION COMPLEXITY: Moderate   GOALS: Goals reviewed with patient? Yes  SHORT TERM GOALS: Target date: 10/28/2023  Pt will be independent with his initial HEP to improve LE strength, balance, and function.  Baseline: Pt has not yet started his initial HEP (10/20/2023); no questions (11/30/2023) Goal status: MET   LONG TERM GOALS: Target date: 12/16/2023  Pt will improve his Berg Balance Test score to at least 48/56 as a demonstration of improved balance and decreased need for AD.  Baseline: 29/56 (10/20/2023); 26/56 (11/28/2023) Goal status: ONGOING  2.  Pt will improve his B LE strength by at least 1/2 MMT grade to promote ability to perform transfers and standing tasks with less difficulty and with improved balance.  Baseline:  MMT Right eval Left eval R 11/30/2023 L 11/30/2023  Hip flexion 4 3- 4 4-  Hip extension (seated manually resisted) 4- 4- 4+ 4+  Hip abduction (seated manually resisted) 4 4 4+ 4+  Knee flexion 4+ 4 5 4+  Knee extension 5 4+ 5 5   Goal status: PARTIALLY MET   3.  Pt will improve his Activities Specific  Balance Confidence (ABC) scale by at least 15% as a demonstration of improved balance.  Baseline:  63.75% (9/18/025); 76% (11/30/2023) Goal status: PARTIALLY MET   PLAN:  PT FREQUENCY: 1-2x/week  PT DURATION: 8 weeks  PLANNED INTERVENTIONS: 97110-Therapeutic exercises, 97530- Therapeutic activity, [id number]- Neuromuscular re-education, 97535- Self Care, [id number]- Manual  therapy, [phone]- Gait training, Patient/Family education, Balance training, and Stair training.  PLAN FOR NEXT SESSION: posture, trunk, hip and knee strengthening, balance training, manual techniques, modalities PRN.    Shon Mansouri, PT, DPT 12/05/2023, 4:11 PM

## 2023-12-07 ENCOUNTER — Encounter

## 2023-12-07 ENCOUNTER — Other Ambulatory Visit: Payer: Self-pay

## 2023-12-08 ENCOUNTER — Ambulatory Visit

## 2023-12-08 DIAGNOSIS — R2681 Unsteadiness on feet: Secondary | ICD-10-CM

## 2023-12-08 DIAGNOSIS — M6281 Muscle weakness (generalized): Secondary | ICD-10-CM

## 2023-12-08 DIAGNOSIS — Z9181 History of falling: Secondary | ICD-10-CM

## 2023-12-08 NOTE — Therapy (Signed)
 OUTPATIENT PHYSICAL THERAPY TREATMENT   Patient Name: Leonard Stokes MRN: [id number] DOB:10-09-1945, 78 y.o., male Today's Date: 12/08/2023  END OF SESSION:  PT End of Session - 12/08/23 1120     Visit Number 12    Number of Visits 17    Date for Recertification  12/16/23    PT Start Time 1120    PT Stop Time 1200    PT Time Calculation (min) 40 min    Activity Tolerance Patient tolerated treatment well    Behavior During Therapy [facility] for tasks assessed/performed                     Past Medical History:  Diagnosis Date   Anemia    Arthritis    Atherosclerosis of aorta 2018   BPH (benign prostatic hyperplasia)    Cancer (HCC) 2019   prostate   Chronic kidney disease 08/2016   kidney stone   Diverticulosis of sigmoid colon    History of kidney stones    Hypertension    Leg cramps    Past Surgical History:  Procedure Laterality Date   APPENDECTOMY     EXTRACORPOREAL SHOCK WAVE LITHOTRIPSY Right 08/19/2016   Procedure: EXTRACORPOREAL SHOCK WAVE LITHOTRIPSY (ESWL);  Surgeon: Penne Knee, MD;  Location: [facility];  Service: Urology;  Laterality: Right;   EXTRACORPOREAL SHOCK WAVE LITHOTRIPSY Left 03/24/2017   Procedure: EXTRACORPOREAL SHOCK WAVE LITHOTRIPSY (ESWL);  Surgeon: Penne Knee, MD;  Location: [facility];  Service: Urology;  Laterality: Left;   EYE MUSCLE SURGERY     INGUINAL HERNIA REPAIR Right 12/23/2017   Direct hernia, medium Ultra Pro mesh;  Surgeon: Dessa Reyes ORN, MD;  Location: [facility];  Service: General;  Laterality: Right;   JOINT REPLACEMENT     KNEE ARTHROPLASTY Left 11/15/2016   Procedure: COMPUTER ASSISTED TOTAL KNEE ARTHROPLASTY;  Surgeon: Mardee Lynwood SQUIBB, MD;  Location: [facility];  Service: Orthopedics;  Laterality: Left;   TOTAL KNEE ARTHROPLASTY Right    Patient Active Problem List   Diagnosis Date Noted   Thrombocytopenia 12/01/2023   Mood swing 10/05/2023   Gait instability 10/05/2023   Encounter for monitoring  androgen deprivation therapy 09/15/2023   Neoplasm related pain 09/15/2023   Prostate cancer metastatic to multiple sites [facility]) 12/01/2017   Right inguinal hernia 04/15/2017   Hypertension 11/30/2016   S/P total knee arthroplasty 11/15/2016   Personal history of tobacco use, presenting hazards to health 09/26/2014    PCP: Diedra Lame, MD   REFERRING PROVIDER: Babara Call, MD   REFERRING DIAG: C61 (ICD-10-CM) - Prostate cancer metastatic to multiple sites (HCC) R53.1 (ICD-10-CM) - Weakness  Rationale for Evaluation and Treatment: Rehabilitation  THERAPY DIAG:  Unsteadiness on feet  Muscle weakness (generalized)  History of falling  ONSET DATE: 10/05/2023 (Date PT referral signed)  SUBJECTIVE:  SUBJECTIVE STATEMENT: Has been doing his ankle stretches. Going out of town next Thursday and will not be back in town until 12/26/23. Going up steps is getting better.       PERTINENT HISTORY:  Weakness, decreased balance. Uneven surfaces causes him to lose balance. Also is unsteady when he first wakes up in the morning. Difficulty with balance started about 6-8 weeks ago which is worsening. Pt was cleaning up after his dog and lost balance going forward and could not get up. His PT neighbor helped him up. Had difficulty using his L LE to get himself up.    Blood pressure is controlled per pt.  No latex allergies.    No pain currently. Pain is primarily at night, around his abdominal area.   Standing up from a seated position such as a toilet, is very difficult, needs a grab bar assist.      PAIN:  Are you having pain? Yes: NPRS scale: none at the moment.  Pain location: abdominal area and some in the back, might be cancer related.  Pain description: achy Aggravating factors: unknown, does  not have it all the time.  Relieving factors: oxycodone   PRECAUTIONS: Fall and Other: CA  RED FLAGS: Bowel or bladder incontinence: No and Cauda equina syndrome: No   WEIGHT BEARING RESTRICTIONS: No  FALLS:  Has patient fallen in last 6 months? Yes. Number of falls 6, pt states catching himself most of the time. Did not really fall, he just loss balance. Uneven surfaces causes him to lose balance  LIVING ENVIRONMENT: Lives with: lives with their spouse Lives in: House/apartment Stairs: Yes: Internal: 14 steps; on right going up and External: 2 steps; on right going up Has following equipment at home: Single point cane, Walker - 4 wheeled, and Grab bars  OCCUPATION: retired  PLOF: Independent  PATIENT GOALS: getting up from the chair and toilet, and improve B LE strength L > R  NEXT MD VISIT: about 1-2 weeks from now.  OBJECTIVE:  Note: Objective measures were completed at Evaluation unless otherwise noted.  DIAGNOSTIC FINDINGS:    PATIENT SURVEYS:  ABC scale: The Activities-Specific Balance Confidence (ABC) Scale 0% 10 20 30  40 50 60 70 80 90 100% No confidence<->completely confident  "How confident are you that you will not lose your balance or become unsteady when you . . .   Date tested 10/20/2023  Walk around the house 50%  2. Walk up or down stairs 50%  3. Bend over and pick up a slipper from in front of a closet floor 0%  4. Reach for a small can off a shelf at eye level 100%  5. Stand on tip toes and reach for something above your head 50%  6. Stand on a chair and reach for something 30%  7. Sweep the floor 60%  8. Walk outside the house to a car parked in the driveway 100%  9. Get into or out of a car 100%  10. Walk across a parking lot to the mall 80%  11. Walk up or down a ramp 80%  12. Walk in a crowded mall where people rapidly walk past you 100%  13. Are bumped into by people as you walk through the mall 80%  14. Step onto or off of an escalator while  you are holding onto the railing 100%  15. Step onto or off an escalator while holding onto parcels such that you cannot hold onto the railing 40%  16.  Walk outside on icy sidewalks 0%  Total: #/16 1020%/16 = 63.75%     COGNITION: Overall cognitive status: Within functional limits for tasks assessed     SENSATION:   MUSCLE LENGTH:   POSTURE: wide base of support, hip and knee flexion, forward flexed posture, R hip ER,   PALPATION:   LUMBAR ROM:   AROM eval  Flexion in sitting  full  Extension WFL (LOB backwards in standing)  Right lateral flexion  WFL  Left lateral flexion WFL  Right rotation in sitting WFL  Left rotation in sitting WFL   (Blank rows = not tested)  LOWER EXTREMITY ROM:     Passive  Right 11/30/2023 Left 11/30/2023  Hip flexion    Hip extension    Hip abduction    Hip adduction    Hip internal rotation    Hip external rotation    Knee flexion    Knee extension    Ankle dorsiflexion -38 (-10 AAROM) -20 (-4 AAROM)  Ankle plantarflexion    Ankle inversion    Ankle eversion     (Blank rows = not tested)  LOWER EXTREMITY MMT:    MMT Right eval Left eval  Hip flexion 4 3-  Hip extension (seated manually resisted) 4- 4-  Hip abduction (seated manually resisted) 4 4  Hip adduction    Hip internal rotation    Hip external rotation    Knee flexion 4+ 4  Knee extension 5 4+  Ankle dorsiflexion    Ankle plantarflexion    Ankle inversion    Ankle eversion     (Blank rows = not tested)  LUMBAR SPECIAL TESTS:  (-) heel to shin test (+) finger to nose on L side.   Difficulty with rapid alternating palms up and down.   FUNCTIONAL TESTS:  Lars Balance Scale:  Item Test date: 10/20/2023 Date:  Date:   Sitting to standing 3. able to stand independently using hands Insert SmartPhrase OPRCBERGREEVAL Insert SmartPhrase OPRCBERGREEVAL  2. Standing unsupported 2. able to stand 30 seconds unsupported    3. Sitting with back unsupported, feet  supported 4. able to sit safely and securely for 2 minutes    4. Standing to sitting 3. controls descent by using hands    5. Pivot transfer  3. able to transfer safely with definite need of hands    6. Standing unsupported with eyes closed 3. able to stand 10 seconds with supervision    7. Standing unsupported with feet together 3. able to place feet together independently and stand 1 minute with supervision    8. Reaching forward with outstretched arms while standing 2. can reach forward 5 cm (2 inches)    9. Pick up object from the floor from standing 3. able to pick up slipper but needs supervision    10. Turning to look behind over left and right shoulders while standing 1. needs supervision when turning    11. Turn 360 degrees 1. needs close supervision or verbal cuing    12. Place alternate foot on step or stool while standing unsupported 0. needs assistance to keep from falling/unable to try    13. Standing unsupported one foot in front 1. needs help to step but can hold 15 seconds    14. Standing on one leg 0. unable to try of needs assist to prevent fall      Total Score 29/56 Total Score:    Total Score:       Score Interpretation:  Score of <19 indicates high risk of falls.  Minimally Clinically Important Difference (MCID):  =DGI scores of<21/24 = 1.80 points DGI scores of >21/24 = 0.60 points   Disautel T, Inbar-Borovsky N, Brozgol M, Giladi N, [place] JM. The Dynamic Gait Index in healthy older adults: the role of stair climbing, fear of falling and gender. Gait Posture. 2009 Feb;29(2):237-41. doi: 10.1016/j.gaitpost.2008.08.013. Epub 2008 Oct 8. PMID: 81154560; PMCID: EFR7290501.  Pardasaney, MYRTIS LOIS Bonus, GEANNIE POUR., et al. (2012). Sensitivity to change and responsiveness of four balance measures for community-dwelling older adults. Physical therapy 92(3): 388-397.   Item Test date: 11/28/2023 Date:  Date:   Sitting to standing 3. able to stand independently using hands  Insert SmartPhrase OPRCBERGREEVAL Insert SmartPhrase OPRCBERGREEVAL  2. Standing unsupported 3. able to stand 2 minutes with supervision    3. Sitting with back unsupported, feet supported 4. able to sit safely and securely for 2 minutes    4. Standing to sitting 4. sits safely with minimal use of hands    5. Pivot transfer  3. able to transfer safely with definite need of hands    6. Standing unsupported with eyes closed 3. able to stand 10 seconds with supervision    7. Standing unsupported with feet together 0. needs help to attain position and unable to hold for 15 seconds    8. Reaching forward with outstretched arms while standing 0. loses balance while trying/requires external support    9. Pick up object from the floor from standing 3. able to pick up slipper but needs supervision    10. Turning to look behind over left and right shoulders while standing 3. looks behind one side only, other side shows less weight shift    11. Turn 360 degrees 0. needs assistance while turning    12. Place alternate foot on step or stool while standing unsupported 0. needs assistance to keep from falling/unable to try    13. Standing unsupported one foot in front 0. loses balance while stepping or standing    14. Standing on one leg 0. unable to try of needs assist to prevent fall      Total Score 26/56 Total Score:    Total Score:      GAIT: Distance walked: 30 ft Assistive device utilized: Single point cane Level of assistance: Modified independence and SBA Comments: SPC on R, decreased stance R LE, forward flexed, decreased gait velocity.   TREATMENT DATE: 12/08/2023                                                                                                                               Manual therapy Supine STM R and L gastroc to decrease muscle tension and promote ankle DF ROM  Therapeutic activities Performed with the intent of improving ability to perform standing tasks with less  difficulty and less fall risk.   Standing with B UE assist   Ankle DF/PF on rocker  board  Reclined   Ankle DF stretch with tibialis anterior activation to promote forward weight shifting and ankle strategy for balance.    R 10x5 seconds for 3 sets   L 10x5 seconds for 3 sets  Standing static   Mini lunge with contralateral UE assist   R 10x   L 10x  Improved technique, movement at target joints, use of target muscles after mod verbal, visual, tactile cues.       PATIENT EDUCATION:  Education details: POC Person educated: Patient Education method: Explanation Education comprehension: verbalized understanding  HOME EXERCISE PROGRAM: Access Code: [email or web address] URL: [email or web address] Date: 11/08/2023 Prepared by: Emil Glassman  Exercises - Seated Hip Flexion  - 1 x daily - 7 x weekly - 3 sets - 5 reps - Narrow Stance with Counter Support  - 3 x daily - 7 x weekly - 1 sets - 5 reps - 30 seconds hold - Standing Gastroc Stretch at Counter  - 3 x daily - 7 x weekly - 1 sets - 3 reps - 1 minute hold  - Standing Single Leg Stance with Counter Support  - 1 x daily - 7 x weekly - 3 sets - 10 reps - 5 seconds hold  - Seated Ankle Plantar Flexion with Resistance Loop  - 1 x daily - 7 x weekly - 3 sets - 10 reps  Blue band.  - Lunge with Counter Support  - 1 x daily - 7 x weekly - 3 sets - 10 reps    ASSESSMENT:  CLINICAL IMPRESSION:  Continued working on improving B ankle DF strength and ROM to improve forward weight shifting, ankle strategy and balance, as well as improving B LE functional strength for stair negotiation. Pt tolerated session well without aggravation of symptoms. Pt will benefit from continued skilled physical therapy services to improve strength, balance and function.    OBJECTIVE IMPAIRMENTS: Abnormal gait, decreased balance, difficulty walking, decreased strength, improper body mechanics, and postural dysfunction.   ACTIVITY LIMITATIONS:  carrying, lifting, bending, standing, squatting, stairs, transfers, toileting, dressing, and locomotion level  PARTICIPATION LIMITATIONS:   PERSONAL FACTORS: Age, Fitness, Past/current experiences, Time since onset of injury/illness/exacerbation, and 3+ comorbidities: Cancer, HTN, arthritis are also affecting patient's functional outcome.   REHAB POTENTIAL: Fair    CLINICAL DECISION MAKING: Evolving/moderate complexity; balance is worsening per pt.   EVALUATION COMPLEXITY: Moderate   GOALS: Goals reviewed with patient? Yes  SHORT TERM GOALS: Target date: 10/28/2023  Pt will be independent with his initial HEP to improve LE strength, balance, and function.  Baseline: Pt has not yet started his initial HEP (10/20/2023); no questions (11/30/2023) Goal status: MET   LONG TERM GOALS: Target date: 12/16/2023  Pt will improve his Berg Balance Test score to at least 48/56 as a demonstration of improved balance and decreased need for AD.  Baseline: 29/56 (10/20/2023); 26/56 (11/28/2023) Goal status: ONGOING  2.  Pt will improve his B LE strength by at least 1/2 MMT grade to promote ability to perform transfers and standing tasks with less difficulty and with improved balance.  Baseline:  MMT Right eval Left eval R 11/30/2023 L 11/30/2023  Hip flexion 4 3- 4 4-  Hip extension (seated manually resisted) 4- 4- 4+ 4+  Hip abduction (seated manually resisted) 4 4 4+ 4+  Knee flexion 4+ 4 5 4+  Knee extension 5 4+ 5 5   Goal status: PARTIALLY MET   3.  Pt will improve his Activities Specific Balance Confidence (ABC)  scale by at least 15% as a demonstration of improved balance.  Baseline:  63.75% (9/18/025); 76% (11/30/2023) Goal status: PARTIALLY MET   PLAN:  PT FREQUENCY: 1-2x/week  PT DURATION: 8 weeks  PLANNED INTERVENTIONS: 97110-Therapeutic exercises, 97530- Therapeutic activity, [id number]- Neuromuscular re-education, 97535- Self Care, [id number]- Manual therapy, [id number]- Gait  training, Patient/Family education, Balance training, and Stair training.  PLAN FOR NEXT SESSION: posture, trunk, hip and knee strengthening, balance training, manual techniques, modalities PRN.    Ej Pinson, PT, DPT 12/08/2023, 12:14 PM

## 2023-12-09 ENCOUNTER — Other Ambulatory Visit (HOSPITAL_COMMUNITY): Payer: Self-pay

## 2023-12-13 ENCOUNTER — Other Ambulatory Visit: Payer: Self-pay

## 2023-12-14 ENCOUNTER — Ambulatory Visit

## 2023-12-14 ENCOUNTER — Telehealth: Payer: Self-pay

## 2023-12-14 NOTE — Telephone Encounter (Signed)
 No show. Called patient who said that he did not have today's appointment in his calendar. Will be able to make it to his next follow up session when he comes back from his trip.

## 2023-12-20 ENCOUNTER — Other Ambulatory Visit: Payer: Self-pay

## 2023-12-20 ENCOUNTER — Ambulatory Visit: Admitting: Physician Assistant

## 2023-12-21 ENCOUNTER — Ambulatory Visit: Admitting: Physician Assistant

## 2023-12-21 ENCOUNTER — Ambulatory Visit

## 2023-12-23 ENCOUNTER — Other Ambulatory Visit: Payer: Self-pay

## 2023-12-23 NOTE — Progress Notes (Signed)
 Specialty Pharmacy Refill Coordination Note  Leonard Stokes is a 78 y.o. male contacted today regarding refills of specialty medication(s) Darolutamide  (Nubeqa )   Patient requested Delivery   Delivery date: 12/27/23   Verified address: [address] [place]   Medication will be filled on: 12/26/23

## 2023-12-23 NOTE — Progress Notes (Signed)
 Spoke with patient and he only has 1 tablet left so it will be brought to [facility] instead.

## 2023-12-23 NOTE — Progress Notes (Signed)
 Specialty Pharmacy Ongoing Clinical Assessment Note  Leonard Stokes is a 78 y.o. male who is being followed by the specialty pharmacy service for RxSp Oncology   Patient's specialty medication(s) reviewed today: Darolutamide  (Nubeqa )   Missed doses in the last 4 weeks: 0   Patient/Caregiver did not have any additional questions or concerns.   Therapeutic benefit summary: Patient is achieving benefit   Adverse events/side effects summary: No adverse events/side effects   Patient's therapy is appropriate to: Continue    Goals Addressed             This Visit's Progress    Slow Disease Progression       Patient is on track. Patient will maintain adherence. PSA decreased significantly to 0.25 on 12/01/23.           Follow up: 3 months  Alice Burnside M Ioanna Colquhoun Specialty Pharmacist

## 2023-12-26 ENCOUNTER — Encounter: Payer: Self-pay | Admitting: Physician Assistant

## 2023-12-26 ENCOUNTER — Ambulatory Visit: Admitting: Physician Assistant

## 2023-12-26 ENCOUNTER — Other Ambulatory Visit: Payer: Self-pay

## 2023-12-26 VITALS — BP 134/76 | HR 79 | Ht 71.0 in | Wt 171.2 lb

## 2023-12-26 DIAGNOSIS — R350 Frequency of micturition: Secondary | ICD-10-CM

## 2023-12-26 DIAGNOSIS — N401 Enlarged prostate with lower urinary tract symptoms: Secondary | ICD-10-CM | POA: Diagnosis not present

## 2023-12-26 LAB — BLADDER SCAN AMB NON-IMAGING

## 2023-12-26 MED ORDER — TROSPIUM CHLORIDE ER 60 MG PO CP24: 1.0000 | ORAL_CAPSULE | Freq: Every day | ORAL | 2 refills | Status: DC

## 2023-12-26 NOTE — Progress Notes (Signed)
 12/26/2023 2:31 PM   Leonard Stokes [birth year] [id number]  CC: Chief Complaint  Patient presents with   Urinary Frequency   HPI: Leonard Stokes is a 78 y.o. male with PMH metastatic prostate cancer on Nubeqa  and ADT per Dr. Babara, ED s/p IPP placement and revision, and BPH with urinary frequency/nocturia on Flomax  and finasteride  who presents today for symptom recheck on Gemtesa .   Today he reports no significant change in his frequency or nocturia on Gemtesa .  He describes hourly nocturia.  PSA has continued to decline on dual pharmacotherapy, most recently 0.25.  Bladder scan on arrival , last void about 1 hour prior.  PMH: Past Medical History:  Diagnosis Date   Anemia    Arthritis    Atherosclerosis of aorta 2018   BPH (benign prostatic hyperplasia)    Cancer (HCC) 2019   prostate   Chronic kidney disease 08/2016   kidney stone   Diverticulosis of sigmoid colon    History of kidney stones    Hypertension    Leg cramps     Surgical History: Past Surgical History:  Procedure Laterality Date   APPENDECTOMY     EXTRACORPOREAL SHOCK WAVE LITHOTRIPSY Right 08/19/2016   Procedure: EXTRACORPOREAL SHOCK WAVE LITHOTRIPSY (ESWL);  Surgeon: Penne Knee, MD;  Location: [facility] OR[facility];  Service: Urology;  Laterality: Right;   EXTRACORPOREAL SHOCK WAVE LITHOTRIPSY Left 03/24/2017   Procedure: EXTRACORPOREAL SHOCK WAVE LITHOTRIPSY (ESWL);  Surgeon: Penne Knee, MD;  Location: [facility] OR[facility];  Service: Urology;  Laterality: Left;   EYE MUSCLE SURGERY     INGUINAL HERNIA REPAIR Right 12/23/2017   Direct hernia, medium Ultra Pro mesh;  Surgeon: Dessa Reyes ORN, MD;  Location: [facility] OR[facility];  Service: General;  Laterality: Right;   JOINT REPLACEMENT     KNEE ARTHROPLASTY Left 11/15/2016   Procedure: COMPUTER ASSISTED TOTAL KNEE ARTHROPLASTY;  Surgeon: Mardee Lynwood SQUIBB, MD;  Location: [facility] ORS;  Service: Orthopedics;  Laterality: Left;   TOTAL KNEE ARTHROPLASTY Right     Home  Medications:  Allergies as of 12/26/2023       Reactions   Codeine Rash        Medication List        Accurate as of December 26, 2023  2:31 PM. If you have any questions, ask your nurse or doctor.          acetaminophen  500 MG tablet Commonly known as: TYLENOL  Take 1,000 mg by mouth every 6 (six) hours as needed for moderate pain.   atorvastatin 20 MG tablet Commonly known as: LIPITOR Take by mouth.   B COMPLEX-B12 PO Take 1 tablet by mouth daily.   CALCIUM ACETATE PO Take 2 tablets by mouth daily.   cyanocobalamin 1000 MCG tablet Take by mouth.   cyclobenzaprine  10 MG tablet Commonly known as: FLEXERIL  Take 1 tablet (10 mg total) by mouth 3 (three) times daily as needed for muscle spasms.   docusate sodium  100 MG capsule Commonly known as: Colace Take 1 capsule (100 mg total) by mouth 2 (two) times daily.   famotidine  20 MG tablet Commonly known as: PEPCID  Take 20 mg by mouth at bedtime.   ferrous sulfate  325 (65 FE) MG tablet Take 325 mg by mouth daily with breakfast.   finasteride  5 MG tablet Commonly known as: PROSCAR  TAKE ONE TABLET (5 MG) BY MOUTH EVERY OTHER DAY   Gemtesa  75 MG Tabs Generic drug: Vibegron  Take 1 tablet (75 mg total) by mouth daily.   hydrochlorothiazide   12.5 MG tablet Commonly known as: HYDRODIURIL    ipratropium 0.03 % nasal spray Commonly known as: ATROVENT SMARTSIG:1-2 Spray(s) Both Nares 3 Times Daily PRN   losartan -hydrochlorothiazide  50-12.5 MG tablet Commonly known as: HYZAAR Take 1 tablet by mouth every morning.   meloxicam 15 MG tablet Commonly known as: MOBIC Take 1 tablet by mouth daily.   methocarbamol 500 MG tablet Commonly known as: ROBAXIN Take 500 mg by mouth.   NONFORMULARY OR COMPOUNDED ITEM Trimix (30/1/10)-(Pap/Phent/PGE)  Test Dose  1ml vial   Qty #3 Refills 0  [facility] [phone] Fax [phone]   Nubeqa  300 MG tablet Generic drug: darolutamide  Take 2 tablets (600 mg  total) by mouth 2 (two) times daily with a meal.   OSTEO BI-FLEX TRIPLE STRENGTH PO Take 1 tablet by mouth daily.   oxyCODONE  5 MG immediate release tablet Commonly known as: Roxicodone  Take 1 tablet (5 mg total) by mouth every 8 (eight) hours as needed for severe pain (pain score 7-10) or breakthrough pain.   polyethylene glycol 17 g packet Commonly known as: MiraLax  Take 17 g by mouth daily.   SENIOR MULTIVITAMIN PLUS PO Take 1 tablet by mouth daily.   tamsulosin  0.4 MG Caps capsule Commonly known as: FLOMAX  TAKE ONE CAPSULE DAILY 30 MINUTES AFTER THE SAME MEAL EACH DAY   vitamin C  1000 MG tablet Take 1,000 mg by mouth daily.   Vitamin D-3 125 MCG (5000 UT) Tabs Take 5,000 Units by mouth daily.   vitamin E 180 MG (400 UNITS) capsule Take by mouth.   zolpidem  10 MG tablet Commonly known as: AMBIEN  Take 10 mg by mouth at bedtime as needed for sleep.        Allergies:  Allergies  Allergen Reactions   Codeine Rash    Family History: No family history on file.  Social History:   reports that he quit smoking about 27 years ago. His smoking use included cigarettes. He started smoking about 47 years ago. He has a 60 pack-year smoking history. He has never used smokeless tobacco. He reports current alcohol use. He reports that he does not use drugs.  Physical Exam: BP 134/76   Pulse 79   Ht 5' 11 (1.803 m)   Wt 171 lb 3.2 oz (77.7 kg)   BMI 23.88 kg/m   Constitutional:  Alert and oriented, no acute distress, nontoxic appearing HEENT: [place], AT Cardiovascular: No clubbing, cyanosis, or edema Respiratory: Normal respiratory effort, no increased work of breathing Skin: No rashes, bruises or suspicious lesions Neurologic: Grossly intact, no focal deficits, moving all 4 extremities Psychiatric: Normal mood and affect  Laboratory Data: Results for orders placed or performed in visit on 12/26/23  Bladder Scan (Post Void Residual) in office   Collection Time:  12/26/23  2:38 PM  Result Value Ref Range   Scan Result    Assessment & Plan:   1. Benign prostatic hyperplasia with urinary frequency (Primary) Failed Gemtesa  due to inefficacy.  Will try trospium  ER 60 mg as an alternative and see him back in 6 weeks for symptom recheck. - Bladder Scan (Post Void Residual) in office - Trospium  Chloride 60 MG CP24; Take 1 capsule (60 mg total) by mouth daily.  Dispense: 30 capsule; Refill: 2   Return in about 6 weeks (around 02/06/2024) for Symptom recheck with PVR.  Lucie Hones, PA-C  [facility] [place] [address] [place], [place] [address] [phone]

## 2023-12-28 ENCOUNTER — Ambulatory Visit

## 2023-12-28 DIAGNOSIS — R2681 Unsteadiness on feet: Secondary | ICD-10-CM

## 2023-12-28 DIAGNOSIS — M6281 Muscle weakness (generalized): Secondary | ICD-10-CM

## 2023-12-28 DIAGNOSIS — Z9181 History of falling: Secondary | ICD-10-CM

## 2023-12-28 NOTE — Therapy (Signed)
 OUTPATIENT PHYSICAL THERAPY TREATMENT   Patient Name: Leonard Stokes MRN: [id number] DOB:19-Nov-1945, 78 y.o., male Today's Date: 12/28/2023  END OF SESSION:  PT End of Session - 12/28/23 0816     Visit Number 13    Number of Visits 33    Date for Recertification  02/24/24    PT Start Time 0816    PT Stop Time 0903    PT Time Calculation (min) 47 min    Activity Tolerance Patient tolerated treatment well    Behavior During Therapy [facility] for tasks assessed/performed                      Past Medical History:  Diagnosis Date   Anemia    Arthritis    Atherosclerosis of aorta 2018   BPH (benign prostatic hyperplasia)    Cancer (HCC) 2019   prostate   Chronic kidney disease 08/2016   kidney stone   Diverticulosis of sigmoid colon    History of kidney stones    Hypertension    Leg cramps    Past Surgical History:  Procedure Laterality Date   APPENDECTOMY     EXTRACORPOREAL SHOCK WAVE LITHOTRIPSY Right 08/19/2016   Procedure: EXTRACORPOREAL SHOCK WAVE LITHOTRIPSY (ESWL);  Surgeon: Penne Knee, MD;  Location: [facility] OR[facility];  Service: Urology;  Laterality: Right;   EXTRACORPOREAL SHOCK WAVE LITHOTRIPSY Left 03/24/2017   Procedure: EXTRACORPOREAL SHOCK WAVE LITHOTRIPSY (ESWL);  Surgeon: Penne Knee, MD;  Location: [facility];  Service: Urology;  Laterality: Left;   EYE MUSCLE SURGERY     INGUINAL HERNIA REPAIR Right 12/23/2017   Direct hernia, medium Ultra Pro mesh;  Surgeon: Dessa Reyes ORN, MD;  Location: [facility];  Service: General;  Laterality: Right;   JOINT REPLACEMENT     KNEE ARTHROPLASTY Left 11/15/2016   Procedure: COMPUTER ASSISTED TOTAL KNEE ARTHROPLASTY;  Surgeon: Mardee Lynwood SQUIBB, MD;  Location: [facility];  Service: Orthopedics;  Laterality: Left;   TOTAL KNEE ARTHROPLASTY Right    Patient Active Problem List   Diagnosis Date Noted   Thrombocytopenia 12/01/2023   Mood swing 10/05/2023   Gait instability 10/05/2023   Encounter for monitoring  androgen deprivation therapy 09/15/2023   Neoplasm related pain 09/15/2023   Prostate cancer metastatic to multiple sites [facility]) 12/01/2017   Right inguinal hernia 04/15/2017   Hypertension 11/30/2016   S/P total knee arthroplasty 11/15/2016   Personal history of tobacco use, presenting hazards to health 09/26/2014    PCP: Diedra Lame, MD   REFERRING PROVIDER: Babara Call, MD   REFERRING DIAG: C61 (ICD-10-CM) - Prostate cancer metastatic to multiple sites (HCC) R53.1 (ICD-10-CM) - Weakness  Rationale for Evaluation and Treatment: Rehabilitation  THERAPY DIAG:  Unsteadiness on feet - Plan: PT plan of care cert/re-cert  Muscle weakness (generalized) - Plan: PT plan of care cert/re-cert  History of falling - Plan: PT plan of care cert/re-cert  ONSET DATE: 10/05/2023 (Date PT referral signed)  SUBJECTIVE:  SUBJECTIVE STATEMENT: Thinks his walking is getting better, using a SPC gives him stability. Has been doing some of his HEP, not all of them but doing his ankle exercises. Wants to continue PT because he feels like he is better off doing so.        PERTINENT HISTORY:  Weakness, decreased balance. Uneven surfaces causes him to lose balance. Also is unsteady when he first wakes up in the morning. Difficulty with balance started about 6-8 weeks ago which is worsening. Pt was cleaning up after his dog and lost balance going forward and could not get up. His PT neighbor helped him up. Had difficulty using his L LE to get himself up.    Blood pressure is controlled per pt.  No latex allergies.    No pain currently. Pain is primarily at night, around his abdominal area.   Standing up from a seated position such as a toilet, is very difficult, needs a grab bar assist.      PAIN:  Are you having  pain? Yes: NPRS scale: none at the moment.  Pain location: abdominal area and some in the back, might be cancer related.  Pain description: achy Aggravating factors: unknown, does not have it all the time.  Relieving factors: oxycodone   PRECAUTIONS: Fall and Other: CA  RED FLAGS: Bowel or bladder incontinence: No and Cauda equina syndrome: No   WEIGHT BEARING RESTRICTIONS: No  FALLS:  Has patient fallen in last 6 months? Yes. Number of falls 6, pt states catching himself most of the time. Did not really fall, he just loss balance. Uneven surfaces causes him to lose balance  LIVING ENVIRONMENT: Lives with: lives with their spouse Lives in: House/apartment Stairs: Yes: Internal: 14 steps; on right going up and External: 2 steps; on right going up Has following equipment at home: Single point cane, Walker - 4 wheeled, and Grab bars  OCCUPATION: retired  PLOF: Independent  PATIENT GOALS: getting up from the chair and toilet, and improve B LE strength L > R  NEXT MD VISIT: about 1-2 weeks from now.  OBJECTIVE:  Note: Objective measures were completed at Evaluation unless otherwise noted.  DIAGNOSTIC FINDINGS:    PATIENT SURVEYS:  ABC scale: The Activities-Specific Balance Confidence (ABC) Scale 0% 10 20 30  40 50 60 70 80 90 100% No confidence<->completely confident  "How confident are you that you will not lose your balance or become unsteady when you . . .   Date tested 10/20/2023  Walk around the house 50%  2. Walk up or down stairs 50%  3. Bend over and pick up a slipper from in front of a closet floor 0%  4. Reach for a small can off a shelf at eye level 100%  5. Stand on tip toes and reach for something above your head 50%  6. Stand on a chair and reach for something 30%  7. Sweep the floor 60%  8. Walk outside the house to a car parked in the driveway 100%  9. Get into or out of a car 100%  10. Walk across a parking lot to the mall 80%  11. Walk up or down a ramp  80%  12. Walk in a crowded mall where people rapidly walk past you 100%  13. Are bumped into by people as you walk through the mall 80%  14. Step onto or off of an escalator while you are holding onto the railing 100%  15. Step onto or off an  escalator while holding onto parcels such that you cannot hold onto the railing 40%  16. Walk outside on icy sidewalks 0%  Total: #/16 1020%/16 = 63.75%     COGNITION: Overall cognitive status: Within functional limits for tasks assessed     SENSATION:   MUSCLE LENGTH:   POSTURE: wide base of support, hip and knee flexion, forward flexed posture, R hip ER,   PALPATION:   LUMBAR ROM:   AROM eval  Flexion in sitting  full  Extension WFL (LOB backwards in standing)  Right lateral flexion  WFL  Left lateral flexion WFL  Right rotation in sitting WFL  Left rotation in sitting WFL   (Blank rows = not tested)  LOWER EXTREMITY ROM:     Passive  Right 11/30/2023 Left 11/30/2023  Hip flexion    Hip extension    Hip abduction    Hip adduction    Hip internal rotation    Hip external rotation    Knee flexion    Knee extension    Ankle dorsiflexion -38 (-10 AAROM) -20 (-4 AAROM)  Ankle plantarflexion    Ankle inversion    Ankle eversion     (Blank rows = not tested)  LOWER EXTREMITY MMT:    MMT Right eval Left eval  Hip flexion 4 3-  Hip extension (seated manually resisted) 4- 4-  Hip abduction (seated manually resisted) 4 4  Hip adduction    Hip internal rotation    Hip external rotation    Knee flexion 4+ 4  Knee extension 5 4+  Ankle dorsiflexion    Ankle plantarflexion    Ankle inversion    Ankle eversion     (Blank rows = not tested)  LUMBAR SPECIAL TESTS:  (-) heel to shin test (+) finger to nose on L side.   Difficulty with rapid alternating palms up and down.   FUNCTIONAL TESTS:  Lars Balance Scale:  Item Test date: 10/20/2023 Date:  Date:   Sitting to standing 3. able to stand independently using hands  Insert SmartPhrase OPRCBERGREEVAL Insert SmartPhrase OPRCBERGREEVAL  2. Standing unsupported 2. able to stand 30 seconds unsupported    3. Sitting with back unsupported, feet supported 4. able to sit safely and securely for 2 minutes    4. Standing to sitting 3. controls descent by using hands    5. Pivot transfer  3. able to transfer safely with definite need of hands    6. Standing unsupported with eyes closed 3. able to stand 10 seconds with supervision    7. Standing unsupported with feet together 3. able to place feet together independently and stand 1 minute with supervision    8. Reaching forward with outstretched arms while standing 2. can reach forward 5 cm (2 inches)    9. Pick up object from the floor from standing 3. able to pick up slipper but needs supervision    10. Turning to look behind over left and right shoulders while standing 1. needs supervision when turning    11. Turn 360 degrees 1. needs close supervision or verbal cuing    12. Place alternate foot on step or stool while standing unsupported 0. needs assistance to keep from falling/unable to try    13. Standing unsupported one foot in front 1. needs help to step but can hold 15 seconds    14. Standing on one leg 0. unable to try of needs assist to prevent fall      Total Score  29/56 Total Score:    Total Score:       Score Interpretation: Score of <19 indicates high risk of falls.  Minimally Clinically Important Difference (MCID):  =DGI scores of<21/24 = 1.80 points DGI scores of >21/24 = 0.60 points   Hamilton T, Inbar-Borovsky N, Brozgol M, Giladi N, [place] JM. The Dynamic Gait Index in healthy older adults: the role of stair climbing, fear of falling and gender. Gait Posture. 2009 Feb;29(2):237-41. doi: 10.1016/j.gaitpost.2008.08.013. Epub 2008 Oct 8. PMID: 81154560; PMCID: EFR7290501.  Pardasaney, MYRTIS LOIS Bonus, GEANNIE POUR., et al. (2012). Sensitivity to change and responsiveness of four balance measures for  community-dwelling older adults. Physical therapy 92(3): 388-397.   Item Test date: 11/28/2023 Date:  Date:   Sitting to standing 3. able to stand independently using hands Insert SmartPhrase OPRCBERGREEVAL Insert SmartPhrase OPRCBERGREEVAL  2. Standing unsupported 3. able to stand 2 minutes with supervision    3. Sitting with back unsupported, feet supported 4. able to sit safely and securely for 2 minutes    4. Standing to sitting 4. sits safely with minimal use of hands    5. Pivot transfer  3. able to transfer safely with definite need of hands    6. Standing unsupported with eyes closed 3. able to stand 10 seconds with supervision    7. Standing unsupported with feet together 0. needs help to attain position and unable to hold for 15 seconds    8. Reaching forward with outstretched arms while standing 0. loses balance while trying/requires external support    9. Pick up object from the floor from standing 3. able to pick up slipper but needs supervision    10. Turning to look behind over left and right shoulders while standing 3. looks behind one side only, other side shows less weight shift    11. Turn 360 degrees 0. needs assistance while turning    12. Place alternate foot on step or stool while standing unsupported 0. needs assistance to keep from falling/unable to try    13. Standing unsupported one foot in front 0. loses balance while stepping or standing    14. Standing on one leg 0. unable to try of needs assist to prevent fall      Total Score 26/56 Total Score:    Total Score:    Item Test date: 12/28/2023 Date:  Date:   Sitting to standing 3. able to stand independently using hands Insert SmartPhrase OPRCBERGREEVAL Insert SmartPhrase OPRCBERGREEVAL  2. Standing unsupported 4. able to stand safely for 2 minutes    3. Sitting with back unsupported, feet supported 4. able to sit safely and securely for 2 minutes    4. Standing to sitting 3. controls descent by using hands    5.  Pivot transfer  3. able to transfer safely with definite need of hands    6. Standing unsupported with eyes closed 4. able to stand 10 seconds safely    7. Standing unsupported with feet together 0. needs help to attain position and unable to hold for 15 seconds    8. Reaching forward with outstretched arms while standing 2. can reach forward 5 cm (2 inches)    9. Pick up object from the floor from standing 3. able to pick up slipper but needs supervision    10. Turning to look behind over left and right shoulders while standing 3. looks behind one side only, other side shows less weight shift    11. Turn 360 degrees  0. needs assistance while turning    12. Place alternate foot on step or stool while standing unsupported 0. needs assistance to keep from falling/unable to try    13. Standing unsupported one foot in front 2. able to take small step independently and hold 30 seconds    14. Standing on one leg 0. unable to try of needs assist to prevent fall      Total Score 31/56 Total Score:    Total Score:      GAIT: Distance walked: 30 ft Assistive device utilized: Single point cane Level of assistance: Modified independence and SBA Comments: SPC on R, decreased stance R LE, forward flexed, decreased gait velocity.   TREATMENT DATE: 12/28/2023                                                                                                                               Therapeutic activities Performed with the intent of improving ability to perform standing tasks with less difficulty and less fall risk.   Seated manually resisted hip flexion, hip extension, hip abduction, knee flexion, knee extension  Directed patient with sit <> stand throughout session Stand pivot transfer chair <> low mat table Static standing shoulder width apart, then with eyes closed, then with eyes open feet together, tandem stance with feet shoulder width apart Picking up an object (computer mouse) from the floor,    Turning 360 degrees to the R and to the L 1x Looking behind to the R and to the L,   Standing forward reach,    Standing alternate toe taps 4 x each LE onto first regular step  Reviewed progress with PT towards goals.   Standing static   Mini lunge with contralateral UE assist   R 10x   L 10x  Reviewed POC: continue another 2x/week for 8 weeks to continue progress.    Improved technique, movement at target joints, use of target muscles after mod verbal, visual, tactile cues.       PATIENT EDUCATION:  Education details: POC Person educated: Patient Education method: Explanation Education comprehension: verbalized understanding  HOME EXERCISE PROGRAM: Access Code: [email or web address] URL: [email or web address] Date: 11/08/2023 Prepared by: Emil Glassman  Exercises - Seated Hip Flexion  - 1 x daily - 7 x weekly - 3 sets - 5 reps - Narrow Stance with Counter Support  - 3 x daily - 7 x weekly - 1 sets - 5 reps - 30 seconds hold - Standing Gastroc Stretch at Counter  - 3 x daily - 7 x weekly - 1 sets - 3 reps - 1 minute hold  - Standing Single Leg Stance with Counter Support  - 1 x daily - 7 x weekly - 3 sets - 10 reps - 5 seconds hold  - Seated Ankle Plantar Flexion with Resistance Loop  - 1 x daily - 7 x weekly - 3 sets - 10 reps  Blue band.  - Lunge with  Counter Support  - 1 x daily - 7 x weekly - 3 sets - 10 reps    ASSESSMENT:  CLINICAL IMPRESSION:  Overall improved B LE strength since initial evaluation as well improved overall balance since most recent measurement. Pt making progress, though slow, with PT towards goals. Challenges to progress include age as well as ankle DF weakness and limited ankle DF ROM. Pt tolerated session well without aggravation of symptoms. Pt will benefit from continued skilled physical therapy services to improve strength, balance and function.    OBJECTIVE IMPAIRMENTS: Abnormal gait, decreased balance, difficulty walking,  decreased strength, improper body mechanics, and postural dysfunction.   ACTIVITY LIMITATIONS: carrying, lifting, bending, standing, squatting, stairs, transfers, toileting, dressing, and locomotion level  PARTICIPATION LIMITATIONS:   PERSONAL FACTORS: Age, Fitness, Past/current experiences, Time since onset of injury/illness/exacerbation, and 3+ comorbidities: Cancer, HTN, arthritis are also affecting patient's functional outcome.   REHAB POTENTIAL: Fair    CLINICAL DECISION MAKING: Evolving/moderate complexity; balance is worsening per pt.   EVALUATION COMPLEXITY: Moderate   GOALS: Goals reviewed with patient? Yes  SHORT TERM GOALS: Target date: 10/28/2023  Pt will be independent with his initial HEP to improve LE strength, balance, and function.  Baseline: Pt has not yet started his initial HEP (10/20/2023); no questions (11/30/2023) Goal status: MET   LONG TERM GOALS: Target date: 02/24/2024  Pt will improve his Berg Balance Test score to at least 48/56 as a demonstration of improved balance and decreased need for AD.  Baseline: 29/56 (10/20/2023); 26/56 (11/28/2023); 31/56 (12/28/2023) Goal status: ONGOING  2.  Pt will improve his B LE strength by at least 1/2 MMT grade to promote ability to perform transfers and standing tasks with less difficulty and with improved balance.  Baseline:  MMT Right eval Left eval R 11/30/2023 L 11/30/2023 R 12/28/2023 L 12/28/2023  Hip flexion 4 3- 4 4- 4 4  Hip extension (seated manually resisted) 4- 4- 4+ 4+ 5 4+  Hip abduction (seated manually resisted) 4 4 4+ 4+ 4+ 5  Knee flexion 4+ 4 5 4+ 5 4  Knee extension 5 4+ 5 5 5 5    Goal status: PARTIALLY MET   3.  Pt will improve his Activities Specific Balance Confidence (ABC) scale by at least 15% as a demonstration of improved balance.  Baseline:  63.75% (9/18/025); 76% (11/30/2023) Goal status: PARTIALLY MET   PLAN:  PT FREQUENCY: 1-2x/week  PT DURATION: 8 weeks  PLANNED  INTERVENTIONS: 97110-Therapeutic exercises, 97530- Therapeutic activity, [id number]- Neuromuscular re-education, 97535- Self Care, [id number]- Manual therapy, [phone]- Gait training, Patient/Family education, Balance training, and Stair training.  PLAN FOR NEXT SESSION: posture, trunk, hip and knee strengthening, balance training, manual techniques, modalities PRN.    Marqui Formby, PT, DPT 12/28/2023, 12:23 PM

## 2024-01-04 ENCOUNTER — Ambulatory Visit

## 2024-01-04 DIAGNOSIS — R2681 Unsteadiness on feet: Secondary | ICD-10-CM | POA: Diagnosis present

## 2024-01-04 DIAGNOSIS — M21371 Foot drop, right foot: Secondary | ICD-10-CM | POA: Diagnosis not present

## 2024-01-04 DIAGNOSIS — M6281 Muscle weakness (generalized): Secondary | ICD-10-CM | POA: Insufficient documentation

## 2024-01-04 DIAGNOSIS — Z9181 History of falling: Secondary | ICD-10-CM | POA: Diagnosis present

## 2024-01-04 DIAGNOSIS — R7303 Prediabetes: Secondary | ICD-10-CM | POA: Diagnosis not present

## 2024-01-04 DIAGNOSIS — B351 Tinea unguium: Secondary | ICD-10-CM | POA: Diagnosis not present

## 2024-01-04 NOTE — Therapy (Signed)
 OUTPATIENT PHYSICAL THERAPY TREATMENT   Patient Name: Leonard Stokes MRN: [id number] DOB:12/23/1945, 78 y.o., male Today's Date: 01/04/2024  END OF SESSION:  PT End of Session - 01/04/24 1307     Visit Number 14    Number of Visits 33    Date for Recertification  02/24/24    PT Start Time 1307    PT Stop Time 1346    PT Time Calculation (min) 39 min    Activity Tolerance Patient tolerated treatment well    Behavior During Therapy [facility] for tasks assessed/performed                       Past Medical History:  Diagnosis Date   Anemia    Arthritis    Atherosclerosis of aorta 2018   BPH (benign prostatic hyperplasia)    Cancer (HCC) 2019   prostate   Chronic kidney disease 08/2016   kidney stone   Diverticulosis of sigmoid colon    History of kidney stones    Hypertension    Leg cramps    Past Surgical History:  Procedure Laterality Date   APPENDECTOMY     EXTRACORPOREAL SHOCK WAVE LITHOTRIPSY Right 08/19/2016   Procedure: EXTRACORPOREAL SHOCK WAVE LITHOTRIPSY (ESWL);  Surgeon: Penne Knee, MD;  Location: [facility] ORS;  Service: Urology;  Laterality: Right;   EXTRACORPOREAL SHOCK WAVE LITHOTRIPSY Left 03/24/2017   Procedure: EXTRACORPOREAL SHOCK WAVE LITHOTRIPSY (ESWL);  Surgeon: Penne Knee, MD;  Location: [facility] OR[facility];  Service: Urology;  Laterality: Left;   EYE MUSCLE SURGERY     INGUINAL HERNIA REPAIR Right 12/23/2017   Direct hernia, medium Ultra Pro mesh;  Surgeon: Dessa Reyes ORN, MD;  Location: [facility] OR[facility];  Service: General;  Laterality: Right;   JOINT REPLACEMENT     KNEE ARTHROPLASTY Left 11/15/2016   Procedure: COMPUTER ASSISTED TOTAL KNEE ARTHROPLASTY;  Surgeon: Mardee Lynwood SQUIBB, MD;  Location: [facility] OR[facility];  Service: Orthopedics;  Laterality: Left;   TOTAL KNEE ARTHROPLASTY Right    Patient Active Problem List   Diagnosis Date Noted   Thrombocytopenia 12/01/2023   Mood swing 10/05/2023   Gait instability 10/05/2023   Encounter for monitoring  androgen deprivation therapy 09/15/2023   Neoplasm related pain 09/15/2023   Prostate cancer metastatic to multiple sites [facility]) 12/01/2017   Right inguinal hernia 04/15/2017   Hypertension 11/30/2016   S/P total knee arthroplasty 11/15/2016   Personal history of tobacco use, presenting hazards to health 09/26/2014    PCP: Diedra Lame, MD   REFERRING PROVIDER: Babara Call, MD   REFERRING DIAG: C61 (ICD-10-CM) - Prostate cancer metastatic to multiple sites (HCC) R53.1 (ICD-10-CM) - Weakness  Rationale for Evaluation and Treatment: Rehabilitation  THERAPY DIAG:  Unsteadiness on feet  Muscle weakness (generalized)  History of falling  ONSET DATE: 10/05/2023 (Date PT referral signed)  SUBJECTIVE:  SUBJECTIVE STATEMENT: Thinks his walking is slowly getting better. Got a prescription of AFO from Dr. Neill    PERTINENT HISTORY:  Weakness, decreased balance. Uneven surfaces causes him to lose balance. Also is unsteady when he first wakes up in the morning. Difficulty with balance started about 6-8 weeks ago which is worsening. Pt was cleaning up after his dog and lost balance going forward and could not get up. His PT neighbor helped him up. Had difficulty using his L LE to get himself up.    Blood pressure is controlled per pt.  No latex allergies.    No pain currently. Pain is primarily at night, around his abdominal area.   Standing up from a seated position such as a toilet, is very difficult, needs a grab bar assist.      PAIN:  Are you having pain? Yes: NPRS scale: none at the moment.  Pain location: abdominal area and some in the back, might be cancer related.  Pain description: achy Aggravating factors: unknown, does not have it all the time.  Relieving factors:  oxycodone   PRECAUTIONS: Fall and Other: CA  RED FLAGS: Bowel or bladder incontinence: No and Cauda equina syndrome: No   WEIGHT BEARING RESTRICTIONS: No  FALLS:  Has patient fallen in last 6 months? Yes. Number of falls 6, pt states catching himself most of the time. Did not really fall, he just loss balance. Uneven surfaces causes him to lose balance  LIVING ENVIRONMENT: Lives with: lives with their spouse Lives in: House/apartment Stairs: Yes: Internal: 14 steps; on right going up and External: 2 steps; on right going up Has following equipment at home: Single point cane, Walker - 4 wheeled, and Grab bars  OCCUPATION: retired  PLOF: Independent  PATIENT GOALS: getting up from the chair and toilet, and improve B LE strength L > R  NEXT MD VISIT: about 1-2 weeks from now.  OBJECTIVE:  Note: Objective measures were completed at Evaluation unless otherwise noted.  DIAGNOSTIC FINDINGS:    PATIENT SURVEYS:  ABC scale: The Activities-Specific Balance Confidence (ABC) Scale 0% 10 20 30  40 50 60 70 80 90 100% No confidence<->completely confident  "How confident are you that you will not lose your balance or become unsteady when you . . .   Date tested 10/20/2023  Walk around the house 50%  2. Walk up or down stairs 50%  3. Bend over and pick up a slipper from in front of a closet floor 0%  4. Reach for a small can off a shelf at eye level 100%  5. Stand on tip toes and reach for something above your head 50%  6. Stand on a chair and reach for something 30%  7. Sweep the floor 60%  8. Walk outside the house to a car parked in the driveway 100%  9. Get into or out of a car 100%  10. Walk across a parking lot to the mall 80%  11. Walk up or down a ramp 80%  12. Walk in a crowded mall where people rapidly walk past you 100%  13. Are bumped into by people as you walk through the mall 80%  14. Step onto or off of an escalator while you are holding onto the railing 100%  15.  Step onto or off an escalator while holding onto parcels such that you cannot hold onto the railing 40%  16. Walk outside on icy sidewalks 0%  Total: #/16 1020%/16 = 63.75%  COGNITION: Overall cognitive status: Within functional limits for tasks assessed     SENSATION:   MUSCLE LENGTH:   POSTURE: wide base of support, hip and knee flexion, forward flexed posture, R hip ER,   PALPATION:   LUMBAR ROM:   AROM eval  Flexion in sitting  full  Extension WFL (LOB backwards in standing)  Right lateral flexion  WFL  Left lateral flexion WFL  Right rotation in sitting WFL  Left rotation in sitting WFL   (Blank rows = not tested)  LOWER EXTREMITY ROM:     Passive  Right 11/30/2023 Left 11/30/2023  Hip flexion    Hip extension    Hip abduction    Hip adduction    Hip internal rotation    Hip external rotation    Knee flexion    Knee extension    Ankle dorsiflexion -38 (-10 AAROM) -20 (-4 AAROM)  Ankle plantarflexion    Ankle inversion    Ankle eversion     (Blank rows = not tested)  LOWER EXTREMITY MMT:    MMT Right eval Left eval  Hip flexion 4 3-  Hip extension (seated manually resisted) 4- 4-  Hip abduction (seated manually resisted) 4 4  Hip adduction    Hip internal rotation    Hip external rotation    Knee flexion 4+ 4  Knee extension 5 4+  Ankle dorsiflexion    Ankle plantarflexion    Ankle inversion    Ankle eversion     (Blank rows = not tested)  LUMBAR SPECIAL TESTS:  (-) heel to shin test (+) finger to nose on L side.   Difficulty with rapid alternating palms up and down.   FUNCTIONAL TESTS:  Lars Balance Scale:  Item Test date: 10/20/2023 Date:  Date:   Sitting to standing 3. able to stand independently using hands Insert SmartPhrase OPRCBERGREEVAL Insert SmartPhrase OPRCBERGREEVAL  2. Standing unsupported 2. able to stand 30 seconds unsupported    3. Sitting with back unsupported, feet supported 4. able to sit safely and securely for 2  minutes    4. Standing to sitting 3. controls descent by using hands    5. Pivot transfer  3. able to transfer safely with definite need of hands    6. Standing unsupported with eyes closed 3. able to stand 10 seconds with supervision    7. Standing unsupported with feet together 3. able to place feet together independently and stand 1 minute with supervision    8. Reaching forward with outstretched arms while standing 2. can reach forward 5 cm (2 inches)    9. Pick up object from the floor from standing 3. able to pick up slipper but needs supervision    10. Turning to look behind over left and right shoulders while standing 1. needs supervision when turning    11. Turn 360 degrees 1. needs close supervision or verbal cuing    12. Place alternate foot on step or stool while standing unsupported 0. needs assistance to keep from falling/unable to try    13. Standing unsupported one foot in front 1. needs help to step but can hold 15 seconds    14. Standing on one leg 0. unable to try of needs assist to prevent fall      Total Score 29/56 Total Score:    Total Score:       Score Interpretation: Score of <19 indicates high risk of falls.  Minimally Clinically Important Difference (MCID):  =DGI  scores of<21/24 = 1.80 points DGI scores of >21/24 = 0.60 points   [place] T, Inbar-Borovsky N, Brozgol M, Giladi N, [place] JM. The Dynamic Gait Index in healthy older adults: the role of stair climbing, fear of falling and gender. Gait Posture. 2009 Feb;29(2):237-41. doi: 10.1016/j.gaitpost.2008.08.013. Epub 2008 Oct 8. PMID: 81154560; PMCID: EFR7290501.  Pardasaney, MYRTIS LOIS Bonus, GEANNIE POUR., et al. (2012). Sensitivity to change and responsiveness of four balance measures for community-dwelling older adults. Physical therapy 92(3): 388-397.   Item Test date: 11/28/2023 Date:  Date:   Sitting to standing 3. able to stand independently using hands Insert SmartPhrase OPRCBERGREEVAL Insert SmartPhrase  OPRCBERGREEVAL  2. Standing unsupported 3. able to stand 2 minutes with supervision    3. Sitting with back unsupported, feet supported 4. able to sit safely and securely for 2 minutes    4. Standing to sitting 4. sits safely with minimal use of hands    5. Pivot transfer  3. able to transfer safely with definite need of hands    6. Standing unsupported with eyes closed 3. able to stand 10 seconds with supervision    7. Standing unsupported with feet together 0. needs help to attain position and unable to hold for 15 seconds    8. Reaching forward with outstretched arms while standing 0. loses balance while trying/requires external support    9. Pick up object from the floor from standing 3. able to pick up slipper but needs supervision    10. Turning to look behind over left and right shoulders while standing 3. looks behind one side only, other side shows less weight shift    11. Turn 360 degrees 0. needs assistance while turning    12. Place alternate foot on step or stool while standing unsupported 0. needs assistance to keep from falling/unable to try    13. Standing unsupported one foot in front 0. loses balance while stepping or standing    14. Standing on one leg 0. unable to try of needs assist to prevent fall      Total Score 26/56 Total Score:    Total Score:    Item Test date: 12/28/2023 Date:  Date:   Sitting to standing 3. able to stand independently using hands Insert SmartPhrase OPRCBERGREEVAL Insert SmartPhrase OPRCBERGREEVAL  2. Standing unsupported 4. able to stand safely for 2 minutes    3. Sitting with back unsupported, feet supported 4. able to sit safely and securely for 2 minutes    4. Standing to sitting 3. controls descent by using hands    5. Pivot transfer  3. able to transfer safely with definite need of hands    6. Standing unsupported with eyes closed 4. able to stand 10 seconds safely    7. Standing unsupported with feet together 0. needs help to attain position  and unable to hold for 15 seconds    8. Reaching forward with outstretched arms while standing 2. can reach forward 5 cm (2 inches)    9. Pick up object from the floor from standing 3. able to pick up slipper but needs supervision    10. Turning to look behind over left and right shoulders while standing 3. looks behind one side only, other side shows less weight shift    11. Turn 360 degrees 0. needs assistance while turning    12. Place alternate foot on step or stool while standing unsupported 0. needs assistance to keep from falling/unable to try    13.  Standing unsupported one foot in front 2. able to take small step independently and hold 30 seconds    14. Standing on one leg 0. unable to try of needs assist to prevent fall      Total Score 31/56 Total Score:    Total Score:      GAIT: Distance walked: 30 ft Assistive device utilized: Single point cane Level of assistance: Modified independence and SBA Comments: SPC on R, decreased stance R LE, forward flexed, decreased gait velocity.   TREATMENT DATE: 01/04/2024                                                                                                                                 Therapeutic activities Performed with the intent of improving ability to perform standing tasks with less difficulty and less fall risk.    Standing with B UE assist                Ankle DF/PF on rocker board 2 minutes   Standing gastroc stretch at rocker board 1 minute x 3  Standing static                Mini lunge with contralateral UE assist                        R 10x2                        L 10x2   Side stepping 30 ft to the R and 30 ft to the L to promote glute med strength and balance       Improved technique, movement at target joints, use of target muscles after mod verbal, visual, tactile cues.    Neuromuscular re education   Stepping over 2 mini hurdles with one UE assist 10x2   Forward step up onto and over Air Ex pad  with contralateral UE assist    R 10x2   L 10x2   Improved technique, movement at target joints, use of target muscles after mod verbal, visual, tactile cues.       PATIENT EDUCATION:  Education details: POC Person educated: Patient Education method: Explanation Education comprehension: verbalized understanding  HOME EXERCISE PROGRAM: Access Code: [email or web address] URL: [email or web address] Date: 11/08/2023 Prepared by: Emil Glassman  Exercises - Seated Hip Flexion  - 1 x daily - 7 x weekly - 3 sets - 5 reps - Narrow Stance with Counter Support  - 3 x daily - 7 x weekly - 1 sets - 5 reps - 30 seconds hold - Standing Gastroc Stretch at Counter  - 3 x daily - 7 x weekly - 1 sets - 3 reps - 1 minute hold  - Standing Single Leg Stance with Counter Support  - 1 x daily - 7 x weekly - 3 sets - 10 reps - 5 seconds hold  - Seated Ankle  Plantar Flexion with Resistance Loop  - 1 x daily - 7 x weekly - 3 sets - 10 reps  Blue band.  - Lunge with Counter Support  - 1 x daily - 7 x weekly - 3 sets - 10 reps    ASSESSMENT:  CLINICAL IMPRESSION:  Continued working on improving B ankle DF ROM, glute and quad strengthening as well as ability to step over obstacles, place his center of gravity over base of support to decrease fall risk. Pt needed PT tactile cues for forward weight shifting onto front foot with stepping over mini hurdles as well as stepping onto and over Air Ex pad. Pt tolerated session well without aggravation of symptoms.  Challenges to progress include age as well as ankle DF weakness and limited ankle DF ROM. Pt will benefit from continued skilled physical therapy services to improve strength, balance and function.     OBJECTIVE IMPAIRMENTS: Abnormal gait, decreased balance, difficulty walking, decreased strength, improper body mechanics, and postural dysfunction.   ACTIVITY LIMITATIONS: carrying, lifting, bending, standing, squatting, stairs, transfers, toileting,  dressing, and locomotion level  PARTICIPATION LIMITATIONS:   PERSONAL FACTORS: Age, Fitness, Past/current experiences, Time since onset of injury/illness/exacerbation, and 3+ comorbidities: Cancer, HTN, arthritis are also affecting patient's functional outcome.   REHAB POTENTIAL: Fair    CLINICAL DECISION MAKING: Evolving/moderate complexity; balance is worsening per pt.   EVALUATION COMPLEXITY: Moderate   GOALS: Goals reviewed with patient? Yes  SHORT TERM GOALS: Target date: 10/28/2023  Pt will be independent with his initial HEP to improve LE strength, balance, and function.  Baseline: Pt has not yet started his initial HEP (10/20/2023); no questions (11/30/2023) Goal status: MET   LONG TERM GOALS: Target date: 02/24/2024  Pt will improve his Berg Balance Test score to at least 48/56 as a demonstration of improved balance and decreased need for AD.  Baseline: 29/56 (10/20/2023); 26/56 (11/28/2023); 31/56 (12/28/2023) Goal status: ONGOING  2.  Pt will improve his B LE strength by at least 1/2 MMT grade to promote ability to perform transfers and standing tasks with less difficulty and with improved balance.  Baseline:  MMT Right eval Left eval R 11/30/2023 L 11/30/2023 R 12/28/2023 L 12/28/2023  Hip flexion 4 3- 4 4- 4 4  Hip extension (seated manually resisted) 4- 4- 4+ 4+ 5 4+  Hip abduction (seated manually resisted) 4 4 4+ 4+ 4+ 5  Knee flexion 4+ 4 5 4+ 5 4  Knee extension 5 4+ 5 5 5 5    Goal status: PARTIALLY MET   3.  Pt will improve his Activities Specific Balance Confidence (ABC) scale by at least 15% as a demonstration of improved balance.  Baseline:  63.75% (9/18/025); 76% (11/30/2023) Goal status: PARTIALLY MET   PLAN:  PT FREQUENCY: 1-2x/week  PT DURATION: 8 weeks  PLANNED INTERVENTIONS: 97110-Therapeutic exercises, 97530- Therapeutic activity, [id number]- Neuromuscular re-education, 97535- Self Care, [id number]- Manual therapy, [phone]- Gait training,  Patient/Family education, Balance training, and Stair training.  PLAN FOR NEXT SESSION: posture, trunk, hip and knee strengthening, balance training, manual techniques, modalities PRN.    Vivyan Biggers, PT, DPT 01/04/2024, 1:57 PM

## 2024-01-06 ENCOUNTER — Other Ambulatory Visit: Payer: Self-pay

## 2024-01-09 ENCOUNTER — Ambulatory Visit

## 2024-01-09 DIAGNOSIS — R2681 Unsteadiness on feet: Secondary | ICD-10-CM

## 2024-01-09 DIAGNOSIS — M6281 Muscle weakness (generalized): Secondary | ICD-10-CM

## 2024-01-09 DIAGNOSIS — Z9181 History of falling: Secondary | ICD-10-CM

## 2024-01-09 NOTE — Therapy (Signed)
 OUTPATIENT PHYSICAL THERAPY TREATMENT   Patient Name: Leonard Stokes MRN: [id number] DOB:[birth year], 78 y.o., male Today's Date: 01/09/2024  END OF SESSION:  PT End of Session - 01/09/24 0906     Visit Number 15    Number of Visits 33    Date for Recertification  02/24/24    PT Start Time 0906    PT Stop Time 0954    PT Time Calculation (min) 48 min    Activity Tolerance Patient tolerated treatment well    Behavior During Therapy [facility] for tasks assessed/performed                        Past Medical History:  Diagnosis Date   Anemia    Arthritis    Atherosclerosis of aorta 2018   BPH (benign prostatic hyperplasia)    Cancer (HCC) 2019   prostate   Chronic kidney disease 08/2016   kidney stone   Diverticulosis of sigmoid colon    History of kidney stones    Hypertension    Leg cramps    Past Surgical History:  Procedure Laterality Date   APPENDECTOMY     EXTRACORPOREAL SHOCK WAVE LITHOTRIPSY Right 08/19/2016   Procedure: EXTRACORPOREAL SHOCK WAVE LITHOTRIPSY (ESWL);  Surgeon: Penne Knee, MD;  Location: [facility] OR[facility];  Service: Urology;  Laterality: Right;   EXTRACORPOREAL SHOCK WAVE LITHOTRIPSY Left 03/24/2017   Procedure: EXTRACORPOREAL SHOCK WAVE LITHOTRIPSY (ESWL);  Surgeon: Penne Knee, MD;  Location: [facility];  Service: Urology;  Laterality: Left;   EYE MUSCLE SURGERY     INGUINAL HERNIA REPAIR Right 12/23/2017   Direct hernia, medium Ultra Pro mesh;  Surgeon: Dessa Reyes ORN, MD;  Location: [facility];  Service: General;  Laterality: Right;   JOINT REPLACEMENT     KNEE ARTHROPLASTY Left 11/15/2016   Procedure: COMPUTER ASSISTED TOTAL KNEE ARTHROPLASTY;  Surgeon: Mardee Lynwood SQUIBB, MD;  Location: [facility] OR[facility];  Service: Orthopedics;  Laterality: Left;   TOTAL KNEE ARTHROPLASTY Right    Patient Active Problem List   Diagnosis Date Noted   Thrombocytopenia 12/01/2023   Mood swing 10/05/2023   Gait instability 10/05/2023   Encounter for monitoring  androgen deprivation therapy 09/15/2023   Neoplasm related pain 09/15/2023   Prostate cancer metastatic to multiple sites [facility]) 12/01/2017   Right inguinal hernia 04/15/2017   Hypertension 11/30/2016   S/P total knee arthroplasty 11/15/2016   Personal history of tobacco use, presenting hazards to health 09/26/2014    PCP: Diedra Lame, MD   REFERRING PROVIDER: Babara Call, MD   REFERRING DIAG: C61 (ICD-10-CM) - Prostate cancer metastatic to multiple sites (HCC) R53.1 (ICD-10-CM) - Weakness  Rationale for Evaluation and Treatment: Rehabilitation  THERAPY DIAG:  Unsteadiness on feet  Muscle weakness (generalized)  History of falling  ONSET DATE: 10/05/2023 (Date PT referral signed)  SUBJECTIVE:  SUBJECTIVE STATEMENT: Going to [facility] for his AFO for his R foot tomorrow. Still a little leery about stepping onto curbs.     PERTINENT HISTORY:  Weakness, decreased balance. Uneven surfaces causes him to lose balance. Also is unsteady when he first wakes up in the morning. Difficulty with balance started about 6-8 weeks ago which is worsening. Pt was cleaning up after his dog and lost balance going forward and could not get up. His PT neighbor helped him up. Had difficulty using his L LE to get himself up.    Blood pressure is controlled per pt.  No latex allergies.    No pain currently. Pain is primarily at night, around his abdominal area.   Standing up from a seated position such as a toilet, is very difficult, needs a grab bar assist.      PAIN:  Are you having pain? Yes: NPRS scale: none at the moment.  Pain location: abdominal area and some in the back, might be cancer related.  Pain description: achy Aggravating factors: unknown, does not have it all the time.  Relieving factors:  oxycodone   PRECAUTIONS: Fall and Other: CA  RED FLAGS: Bowel or bladder incontinence: No and Cauda equina syndrome: No   WEIGHT BEARING RESTRICTIONS: No  FALLS:  Has patient fallen in last 6 months? Yes. Number of falls 6, pt states catching himself most of the time. Did not really fall, he just loss balance. Uneven surfaces causes him to lose balance  LIVING ENVIRONMENT: Lives with: lives with their spouse Lives in: House/apartment Stairs: Yes: Internal: 14 steps; on right going up and External: 2 steps; on right going up Has following equipment at home: Single point cane, Walker - 4 wheeled, and Grab bars  OCCUPATION: retired  PLOF: Independent  PATIENT GOALS: getting up from the chair and toilet, and improve B LE strength L > R  NEXT MD VISIT: about 1-2 weeks from now.  OBJECTIVE:  Note: Objective measures were completed at Evaluation unless otherwise noted.  DIAGNOSTIC FINDINGS:    PATIENT SURVEYS:  ABC scale: The Activities-Specific Balance Confidence (ABC) Scale 0% 10 20 30  40 50 60 70 80 90 100% No confidence<->completely confident  "How confident are you that you will not lose your balance or become unsteady when you . . .   Date tested 10/20/2023  Walk around the house 50%  2. Walk up or down stairs 50%  3. Bend over and pick up a slipper from in front of a closet floor 0%  4. Reach for a small can off a shelf at eye level 100%  5. Stand on tip toes and reach for something above your head 50%  6. Stand on a chair and reach for something 30%  7. Sweep the floor 60%  8. Walk outside the house to a car parked in the driveway 100%  9. Get into or out of a car 100%  10. Walk across a parking lot to the mall 80%  11. Walk up or down a ramp 80%  12. Walk in a crowded mall where people rapidly walk past you 100%  13. Are bumped into by people as you walk through the mall 80%  14. Step onto or off of an escalator while you are holding onto the railing 100%  15.  Step onto or off an escalator while holding onto parcels such that you cannot hold onto the railing 40%  16. Walk outside on icy sidewalks 0%  Total: #/16 1020%/16 =  63.75%     COGNITION: Overall cognitive status: Within functional limits for tasks assessed     SENSATION:   MUSCLE LENGTH:   POSTURE: wide base of support, hip and knee flexion, forward flexed posture, R hip ER,   PALPATION:   LUMBAR ROM:   AROM eval  Flexion in sitting  full  Extension WFL (LOB backwards in standing)  Right lateral flexion  WFL  Left lateral flexion WFL  Right rotation in sitting WFL  Left rotation in sitting WFL   (Blank rows = not tested)  LOWER EXTREMITY ROM:     Passive  Right 11/30/2023 Left 11/30/2023  Hip flexion    Hip extension    Hip abduction    Hip adduction    Hip internal rotation    Hip external rotation    Knee flexion    Knee extension    Ankle dorsiflexion -38 (-10 AAROM) -20 (-4 AAROM)  Ankle plantarflexion    Ankle inversion    Ankle eversion     (Blank rows = not tested)  LOWER EXTREMITY MMT:    MMT Right eval Left eval  Hip flexion 4 3-  Hip extension (seated manually resisted) 4- 4-  Hip abduction (seated manually resisted) 4 4  Hip adduction    Hip internal rotation    Hip external rotation    Knee flexion 4+ 4  Knee extension 5 4+  Ankle dorsiflexion    Ankle plantarflexion    Ankle inversion    Ankle eversion     (Blank rows = not tested)  LUMBAR SPECIAL TESTS:  (-) heel to shin test (+) finger to nose on L side.   Difficulty with rapid alternating palms up and down.   FUNCTIONAL TESTS:  Lars Balance Scale:  Item Test date: 10/20/2023 Date:  Date:   Sitting to standing 3. able to stand independently using hands Insert SmartPhrase OPRCBERGREEVAL Insert SmartPhrase OPRCBERGREEVAL  2. Standing unsupported 2. able to stand 30 seconds unsupported    3. Sitting with back unsupported, feet supported 4. able to sit safely and securely for 2  minutes    4. Standing to sitting 3. controls descent by using hands    5. Pivot transfer  3. able to transfer safely with definite need of hands    6. Standing unsupported with eyes closed 3. able to stand 10 seconds with supervision    7. Standing unsupported with feet together 3. able to place feet together independently and stand 1 minute with supervision    8. Reaching forward with outstretched arms while standing 2. can reach forward 5 cm (2 inches)    9. Pick up object from the floor from standing 3. able to pick up slipper but needs supervision    10. Turning to look behind over left and right shoulders while standing 1. needs supervision when turning    11. Turn 360 degrees 1. needs close supervision or verbal cuing    12. Place alternate foot on step or stool while standing unsupported 0. needs assistance to keep from falling/unable to try    13. Standing unsupported one foot in front 1. needs help to step but can hold 15 seconds    14. Standing on one leg 0. unable to try of needs assist to prevent fall      Total Score 29/56 Total Score:    Total Score:       Score Interpretation: Score of <19 indicates high risk of falls.  Minimally Clinically  Important Difference (MCID):  =DGI scores of<21/24 = 1.80 points DGI scores of >21/24 = 0.60 points   [place] T, Inbar-Borovsky N, Brozgol M, Giladi N, [place] JM. The Dynamic Gait Index in healthy older adults: the role of stair climbing, fear of falling and gender. Gait Posture. 2009 Feb;29(2):237-41. doi: 10.1016/j.gaitpost.2008.08.013. Epub 2008 Oct 8. PMID: 81154560; PMCID: EFR7290501.  Pardasaney, MYRTIS LOIS Bonus, GEANNIE POUR., et al. (2012). Sensitivity to change and responsiveness of four balance measures for community-dwelling older adults. Physical therapy 92(3): 388-397.   Item Test date: 11/28/2023 Date:  Date:   Sitting to standing 3. able to stand independently using hands Insert SmartPhrase OPRCBERGREEVAL Insert SmartPhrase  OPRCBERGREEVAL  2. Standing unsupported 3. able to stand 2 minutes with supervision    3. Sitting with back unsupported, feet supported 4. able to sit safely and securely for 2 minutes    4. Standing to sitting 4. sits safely with minimal use of hands    5. Pivot transfer  3. able to transfer safely with definite need of hands    6. Standing unsupported with eyes closed 3. able to stand 10 seconds with supervision    7. Standing unsupported with feet together 0. needs help to attain position and unable to hold for 15 seconds    8. Reaching forward with outstretched arms while standing 0. loses balance while trying/requires external support    9. Pick up object from the floor from standing 3. able to pick up slipper but needs supervision    10. Turning to look behind over left and right shoulders while standing 3. looks behind one side only, other side shows less weight shift    11. Turn 360 degrees 0. needs assistance while turning    12. Place alternate foot on step or stool while standing unsupported 0. needs assistance to keep from falling/unable to try    13. Standing unsupported one foot in front 0. loses balance while stepping or standing    14. Standing on one leg 0. unable to try of needs assist to prevent fall      Total Score 26/56 Total Score:    Total Score:    Item Test date: 12/28/2023 Date:  Date:   Sitting to standing 3. able to stand independently using hands Insert SmartPhrase OPRCBERGREEVAL Insert SmartPhrase OPRCBERGREEVAL  2. Standing unsupported 4. able to stand safely for 2 minutes    3. Sitting with back unsupported, feet supported 4. able to sit safely and securely for 2 minutes    4. Standing to sitting 3. controls descent by using hands    5. Pivot transfer  3. able to transfer safely with definite need of hands    6. Standing unsupported with eyes closed 4. able to stand 10 seconds safely    7. Standing unsupported with feet together 0. needs help to attain position  and unable to hold for 15 seconds    8. Reaching forward with outstretched arms while standing 2. can reach forward 5 cm (2 inches)    9. Pick up object from the floor from standing 3. able to pick up slipper but needs supervision    10. Turning to look behind over left and right shoulders while standing 3. looks behind one side only, other side shows less weight shift    11. Turn 360 degrees 0. needs assistance while turning    12. Place alternate foot on step or stool while standing unsupported 0. needs assistance to keep from falling/unable to  try    13. Standing unsupported one foot in front 2. able to take small step independently and hold 30 seconds    14. Standing on one leg 0. unable to try of needs assist to prevent fall      Total Score 31/56 Total Score:    Total Score:      GAIT: Distance walked: 30 ft Assistive device utilized: Single point cane Level of assistance: Modified independence and SBA Comments: SPC on R, decreased stance R LE, forward flexed, decreased gait velocity.   TREATMENT DATE: 01/09/2024                                                                                                                                 Therapeutic activities Performed with the intent of improving ability to perform standing tasks with less difficulty and less fall risk.    Standing with B UE assist                Ankle DF/PF on rocker board 2 minutes   Standing gastroc stretch at rocker board 1 minute x 3  Seated ankle DF AAROM with PT  R  10x5 seconds for 3 sets   L  10x5 seconds for 3 sets   Standing static                Mini lunge with contralateral UE assist                        R 10x2                         L 10x2    Improved technique, movement at target joints, use of target muscles after mod verbal, visual, tactile cues.    Neuromuscular re education    Stepping over 2 mini hurdles with one UE assist 10x2   Forward step up onto and over 4 inch step  with contralateral UE assist    For curb negotiation   R 10x2   L 10x2     Improved technique, movement at target joints, use of target muscles after mod verbal, visual, tactile cues.       PATIENT EDUCATION:  Education details: POC Person educated: Patient Education method: Explanation Education comprehension: verbalized understanding  HOME EXERCISE PROGRAM: Access Code: [email or web address] URL: [email or web address] Date: 11/08/2023 Prepared by: Emil Glassman  Exercises - Seated Hip Flexion  - 1 x daily - 7 x weekly - 3 sets - 5 reps - Narrow Stance with Counter Support  - 3 x daily - 7 x weekly - 1 sets - 5 reps - 30 seconds hold - Standing Gastroc Stretch at Counter  - 3 x daily - 7 x weekly - 1 sets - 3 reps - 1 minute hold  - Standing Single Leg Stance with Counter Support  - 1 x daily - 7 x weekly -  3 sets - 10 reps - 5 seconds hold  - Seated Ankle Plantar Flexion with Resistance Loop  - 1 x daily - 7 x weekly - 3 sets - 10 reps  Blue band.  - Lunge with Counter Support  - 1 x daily - 7 x weekly - 3 sets - 10 reps    ASSESSMENT:  CLINICAL IMPRESSION:  Continued working on improving B ankle DF ROM, glute and quad strengthening as well as ability to step over obstacles, place his center of gravity over base of support to decrease fall risk. Pt needed PT tactile cues for forward weight shifting onto front foot with stepping over mini hurdles as well as stepping onto and over 4 inch step for curb negotiation. Pt tolerated session well without aggravation of symptoms.  Challenges to progress include age as well as ankle DF weakness and limited ankle DF ROM. Pt will benefit from continued skilled physical therapy services to improve strength, balance and function.     OBJECTIVE IMPAIRMENTS: Abnormal gait, decreased balance, difficulty walking, decreased strength, improper body mechanics, and postural dysfunction.   ACTIVITY LIMITATIONS: carrying, lifting, bending,  standing, squatting, stairs, transfers, toileting, dressing, and locomotion level  PARTICIPATION LIMITATIONS:   PERSONAL FACTORS: Age, Fitness, Past/current experiences, Time since onset of injury/illness/exacerbation, and 3+ comorbidities: Cancer, HTN, arthritis are also affecting patient's functional outcome.   REHAB POTENTIAL: Fair    CLINICAL DECISION MAKING: Evolving/moderate complexity; balance is worsening per pt.   EVALUATION COMPLEXITY: Moderate   GOALS: Goals reviewed with patient? Yes  SHORT TERM GOALS: Target date: 10/28/2023  Pt will be independent with his initial HEP to improve LE strength, balance, and function.  Baseline: Pt has not yet started his initial HEP (10/20/2023); no questions (11/30/2023) Goal status: MET   LONG TERM GOALS: Target date: 02/24/2024  Pt will improve his Berg Balance Test score to at least 48/56 as a demonstration of improved balance and decreased need for AD.  Baseline: 29/56 (10/20/2023); 26/56 (11/28/2023); 31/56 (12/28/2023) Goal status: ONGOING  2.  Pt will improve his B LE strength by at least 1/2 MMT grade to promote ability to perform transfers and standing tasks with less difficulty and with improved balance.  Baseline:  MMT Right eval Left eval R 11/30/2023 L 11/30/2023 R 12/28/2023 L 12/28/2023  Hip flexion 4 3- 4 4- 4 4  Hip extension (seated manually resisted) 4- 4- 4+ 4+ 5 4+  Hip abduction (seated manually resisted) 4 4 4+ 4+ 4+ 5  Knee flexion 4+ 4 5 4+ 5 4  Knee extension 5 4+ 5 5 5 5    Goal status: PARTIALLY MET   3.  Pt will improve his Activities Specific Balance Confidence (ABC) scale by at least 15% as a demonstration of improved balance.  Baseline:  63.75% (9/18/025); 76% (11/30/2023) Goal status: PARTIALLY MET   PLAN:  PT FREQUENCY: 1-2x/week  PT DURATION: 8 weeks  PLANNED INTERVENTIONS: 97110-Therapeutic exercises, 97530- Therapeutic activity, [id number]- Neuromuscular re-education, 97535- Self Care,  [id number]- Manual therapy, [phone]- Gait training, Patient/Family education, Balance training, and Stair training.  PLAN FOR NEXT SESSION: posture, trunk, hip and knee strengthening, balance training, manual techniques, modalities PRN.    Alizea Pell, PT, DPT 01/09/2024, 10:13 AM

## 2024-01-11 ENCOUNTER — Ambulatory Visit

## 2024-01-11 DIAGNOSIS — M6281 Muscle weakness (generalized): Secondary | ICD-10-CM

## 2024-01-11 DIAGNOSIS — R2681 Unsteadiness on feet: Secondary | ICD-10-CM

## 2024-01-11 DIAGNOSIS — Z9181 History of falling: Secondary | ICD-10-CM

## 2024-01-11 NOTE — Therapy (Signed)
 OUTPATIENT PHYSICAL THERAPY TREATMENT   Patient Name: Leonard Stokes MRN: [id number] DOB:07/19/1945, 78 y.o., male Today's Date: 01/11/2024  END OF SESSION:  PT End of Session - 01/11/24 0950     Visit Number 16    Number of Visits 33    Date for Recertification  02/24/24    PT Start Time 0950    PT Stop Time 1040    PT Time Calculation (min) 50 min    Activity Tolerance Patient tolerated treatment well    Behavior During Therapy [facility] for tasks assessed/performed                         Past Medical History:  Diagnosis Date   Anemia    Arthritis    Atherosclerosis of aorta 2018   BPH (benign prostatic hyperplasia)    Cancer (HCC) 2019   prostate   Chronic kidney disease 08/2016   kidney stone   Diverticulosis of sigmoid colon    History of kidney stones    Hypertension    Leg cramps    Past Surgical History:  Procedure Laterality Date   APPENDECTOMY     EXTRACORPOREAL SHOCK WAVE LITHOTRIPSY Right 08/19/2016   Procedure: EXTRACORPOREAL SHOCK WAVE LITHOTRIPSY (ESWL);  Surgeon: Penne Knee, MD;  Location: [facility] ORS;  Service: Urology;  Laterality: Right;   EXTRACORPOREAL SHOCK WAVE LITHOTRIPSY Left 03/24/2017   Procedure: EXTRACORPOREAL SHOCK WAVE LITHOTRIPSY (ESWL);  Surgeon: Penne Knee, MD;  Location: [facility] OR[facility];  Service: Urology;  Laterality: Left;   EYE MUSCLE SURGERY     INGUINAL HERNIA REPAIR Right 12/23/2017   Direct hernia, medium Ultra Pro mesh;  Surgeon: Dessa Reyes ORN, MD;  Location: [facility] OR[facility];  Service: General;  Laterality: Right;   JOINT REPLACEMENT     KNEE ARTHROPLASTY Left 11/15/2016   Procedure: COMPUTER ASSISTED TOTAL KNEE ARTHROPLASTY;  Surgeon: Mardee Lynwood SQUIBB, MD;  Location: [facility] ORS;  Service: Orthopedics;  Laterality: Left;   TOTAL KNEE ARTHROPLASTY Right    Patient Active Problem List   Diagnosis Date Noted   Thrombocytopenia 12/01/2023   Mood swing 10/05/2023   Gait instability 10/05/2023   Encounter for  monitoring androgen deprivation therapy 09/15/2023   Neoplasm related pain 09/15/2023   Prostate cancer metastatic to multiple sites [facility]) 12/01/2017   Right inguinal hernia 04/15/2017   Hypertension 11/30/2016   S/P total knee arthroplasty 11/15/2016   Personal history of tobacco use, presenting hazards to health 09/26/2014    PCP: Diedra Lame, MD   REFERRING PROVIDER: Babara Call, MD   REFERRING DIAG: C61 (ICD-10-CM) - Prostate cancer metastatic to multiple sites (HCC) R53.1 (ICD-10-CM) - Weakness  Rationale for Evaluation and Treatment: Rehabilitation  THERAPY DIAG:  Unsteadiness on feet  Muscle weakness (generalized)  History of falling  ONSET DATE: 10/05/2023 (Date PT referral signed)  SUBJECTIVE:  SUBJECTIVE STATEMENT: Walked his best 1/4 th mile yesterday. Felt a good leg/ankle work out the other day and wants to do it again.   Planning on going to [place] 11/2024   PERTINENT HISTORY:  Weakness, decreased balance. Uneven surfaces causes him to lose balance. Also is unsteady when he first wakes up in the morning. Difficulty with balance started about 6-8 weeks ago which is worsening. Pt was cleaning up after his dog and lost balance going forward and could not get up. His PT neighbor helped him up. Had difficulty using his L LE to get himself up.    Blood pressure is controlled per pt.  No latex allergies.    No pain currently. Pain is primarily at night, around his abdominal area.   Standing up from a seated position such as a toilet, is very difficult, needs a grab bar assist.      PAIN:  Are you having pain? Yes: NPRS scale: none at the moment.  Pain location: abdominal area and some in the back, might be cancer related.  Pain description: achy Aggravating factors: unknown,  does not have it all the time.  Relieving factors: oxycodone   PRECAUTIONS: Fall and Other: CA  RED FLAGS: Bowel or bladder incontinence: No and Cauda equina syndrome: No   WEIGHT BEARING RESTRICTIONS: No  FALLS:  Has patient fallen in last 6 months? Yes. Number of falls 6, pt states catching himself most of the time. Did not really fall, he just loss balance. Uneven surfaces causes him to lose balance  LIVING ENVIRONMENT: Lives with: lives with their spouse Lives in: House/apartment Stairs: Yes: Internal: 14 steps; on right going up and External: 2 steps; on right going up Has following equipment at home: Single point cane, Walker - 4 wheeled, and Grab bars  OCCUPATION: retired  PLOF: Independent  PATIENT GOALS: getting up from the chair and toilet, and improve B LE strength L > R  NEXT MD VISIT: about 1-2 weeks from now.  OBJECTIVE:  Note: Objective measures were completed at Evaluation unless otherwise noted.  DIAGNOSTIC FINDINGS:    PATIENT SURVEYS:  ABC scale: The Activities-Specific Balance Confidence (ABC) Scale 0% 10 20 30  40 50 60 70 80 90 100% No confidence<->completely confident  How confident are you that you will not lose your balance or become unsteady when you . . .   Date tested 10/20/2023  Walk around the house 50%  2. Walk up or down stairs 50%  3. Bend over and pick up a slipper from in front of a closet floor 0%  4. Reach for a small can off a shelf at eye level 100%  5. Stand on tip toes and reach for something above your head 50%  6. Stand on a chair and reach for something 30%  7. Sweep the floor 60%  8. Walk outside the house to a car parked in the driveway 100%  9. Get into or out of a car 100%  10. Walk across a parking lot to the mall 80%  11. Walk up or down a ramp 80%  12. Walk in a crowded mall where people rapidly walk past you 100%  13. Are bumped into by people as you walk through the mall 80%  14. Step onto or off of an escalator  while you are holding onto the railing 100%  15. Step onto or off an escalator while holding onto parcels such that you cannot hold onto the railing 40%  16. Walk  outside on icy sidewalks 0%  Total: #/16 1020%/16 = 63.75%     COGNITION: Overall cognitive status: Within functional limits for tasks assessed     SENSATION:   MUSCLE LENGTH:   POSTURE: wide base of support, hip and knee flexion, forward flexed posture, R hip ER,   PALPATION:   LUMBAR ROM:   AROM eval  Flexion in sitting  full  Extension WFL (LOB backwards in standing)  Right lateral flexion  WFL  Left lateral flexion WFL  Right rotation in sitting WFL  Left rotation in sitting WFL   (Blank rows = not tested)  LOWER EXTREMITY ROM:     Passive  Right 11/30/2023 Left 11/30/2023  Hip flexion    Hip extension    Hip abduction    Hip adduction    Hip internal rotation    Hip external rotation    Knee flexion    Knee extension    Ankle dorsiflexion -38 (-10 AAROM) -20 (-4 AAROM)  Ankle plantarflexion    Ankle inversion    Ankle eversion     (Blank rows = not tested)  LOWER EXTREMITY MMT:    MMT Right eval Left eval  Hip flexion 4 3-  Hip extension (seated manually resisted) 4- 4-  Hip abduction (seated manually resisted) 4 4  Hip adduction    Hip internal rotation    Hip external rotation    Knee flexion 4+ 4  Knee extension 5 4+  Ankle dorsiflexion    Ankle plantarflexion    Ankle inversion    Ankle eversion     (Blank rows = not tested)  LUMBAR SPECIAL TESTS:  (-) heel to shin test (+) finger to nose on L side.   Difficulty with rapid alternating palms up and down.   FUNCTIONAL TESTS:  Lars Balance Scale:  Item Test date: 10/20/2023 Date:  Date:   Sitting to standing 3. able to stand independently using hands Insert SmartPhrase OPRCBERGREEVAL Insert SmartPhrase OPRCBERGREEVAL  2. Standing unsupported 2. able to stand 30 seconds unsupported    3. Sitting with back unsupported, feet  supported 4. able to sit safely and securely for 2 minutes    4. Standing to sitting 3. controls descent by using hands    5. Pivot transfer  3. able to transfer safely with definite need of hands    6. Standing unsupported with eyes closed 3. able to stand 10 seconds with supervision    7. Standing unsupported with feet together 3. able to place feet together independently and stand 1 minute with supervision    8. Reaching forward with outstretched arms while standing 2. can reach forward 5 cm (2 inches)    9. Pick up object from the floor from standing 3. able to pick up slipper but needs supervision    10. Turning to look behind over left and right shoulders while standing 1. needs supervision when turning    11. Turn 360 degrees 1. needs close supervision or verbal cuing    12. Place alternate foot on step or stool while standing unsupported 0. needs assistance to keep from falling/unable to try    13. Standing unsupported one foot in front 1. needs help to step but can hold 15 seconds    14. Standing on one leg 0. unable to try of needs assist to prevent fall      Total Score 29/56 Total Score:    Total Score:       Score Interpretation: Score  of <19 indicates high risk of falls.  Minimally Clinically Important Difference (MCID):  =DGI scores of<21/24 = 1.80 points DGI scores of >21/24 = 0.60 points   Auxvasse T, Inbar-Borovsky N, Brozgol M, Giladi N, [place] JM. The Dynamic Gait Index in healthy older adults: the role of stair climbing, fear of falling and gender. Gait Posture. 2009 Feb;29(2):237-41. doi: 10.1016/j.gaitpost.2008.08.013. Epub 2008 Oct 8. PMID: 81154560; PMCID: EFR7290501.  Pardasaney, MYRTIS LOIS Bonus, GEANNIE POUR., et al. (2012). Sensitivity to change and responsiveness of four balance measures for community-dwelling older adults. Physical therapy 92(3): 388-397.   Item Test date: 11/28/2023 Date:  Date:   Sitting to standing 3. able to stand independently using hands  Insert SmartPhrase OPRCBERGREEVAL Insert SmartPhrase OPRCBERGREEVAL  2. Standing unsupported 3. able to stand 2 minutes with supervision    3. Sitting with back unsupported, feet supported 4. able to sit safely and securely for 2 minutes    4. Standing to sitting 4. sits safely with minimal use of hands    5. Pivot transfer  3. able to transfer safely with definite need of hands    6. Standing unsupported with eyes closed 3. able to stand 10 seconds with supervision    7. Standing unsupported with feet together 0. needs help to attain position and unable to hold for 15 seconds    8. Reaching forward with outstretched arms while standing 0. loses balance while trying/requires external support    9. Pick up object from the floor from standing 3. able to pick up slipper but needs supervision    10. Turning to look behind over left and right shoulders while standing 3. looks behind one side only, other side shows less weight shift    11. Turn 360 degrees 0. needs assistance while turning    12. Place alternate foot on step or stool while standing unsupported 0. needs assistance to keep from falling/unable to try    13. Standing unsupported one foot in front 0. loses balance while stepping or standing    14. Standing on one leg 0. unable to try of needs assist to prevent fall      Total Score 26/56 Total Score:    Total Score:    Item Test date: 12/28/2023 Date:  Date:   Sitting to standing 3. able to stand independently using hands Insert SmartPhrase OPRCBERGREEVAL Insert SmartPhrase OPRCBERGREEVAL  2. Standing unsupported 4. able to stand safely for 2 minutes    3. Sitting with back unsupported, feet supported 4. able to sit safely and securely for 2 minutes    4. Standing to sitting 3. controls descent by using hands    5. Pivot transfer  3. able to transfer safely with definite need of hands    6. Standing unsupported with eyes closed 4. able to stand 10 seconds safely    7. Standing  unsupported with feet together 0. needs help to attain position and unable to hold for 15 seconds    8. Reaching forward with outstretched arms while standing 2. can reach forward 5 cm (2 inches)    9. Pick up object from the floor from standing 3. able to pick up slipper but needs supervision    10. Turning to look behind over left and right shoulders while standing 3. looks behind one side only, other side shows less weight shift    11. Turn 360 degrees 0. needs assistance while turning    12. Place alternate foot on step or stool while  standing unsupported 0. needs assistance to keep from falling/unable to try    13. Standing unsupported one foot in front 2. able to take small step independently and hold 30 seconds    14. Standing on one leg 0. unable to try of needs assist to prevent fall      Total Score 31/56 Total Score:    Total Score:      GAIT: Distance walked: 30 ft Assistive device utilized: Single point cane Level of assistance: Modified independence and SBA Comments: SPC on R, decreased stance R LE, forward flexed, decreased gait velocity.   TREATMENT DATE: 01/11/2024                                                                                                                                 Therapeutic activities Performed with the intent of improving ability to perform standing tasks with less difficulty and less fall risk.    Standing with B UE assist                Ankle DF/PF on rocker board 2 minutes   Standing gastroc stretch at rocker board 1 minute x 3  Seated ankle DF AAROM with PT  R  10x5 seconds for 3 sets   L  10x5 seconds for 3 sets   Standing static                Mini lunge with contralateral UE assist                        R 10x2                         L 10x2   Improved technique, movement at target joints, use of target muscles after mod verbal, visual, tactile cues.    Neuromuscular re education    Static standing with small  physioball (yellow) toss to rebounder CGA 20x3   Standing with PT CGA to min A and L UE assist    Standing on R LE     L LE kicks with small yellow physioball to rebounder, emphasis on placing and maintaining his canter of gravity over R foot/base of support    Forward step up onto and over 4 inch step with contralateral UE assist    For curb negotiation   R 10x   L 10x   Improved technique, movement at target joints, use of target muscles after mod verbal, visual, tactile cues.       PATIENT EDUCATION:  Education details: POC Person educated: Patient Education method: Explanation Education comprehension: verbalized understanding  HOME EXERCISE PROGRAM: Access Code: [email or web address] URL: [email or web address] Date: 11/08/2023 Prepared by: Emil Glassman  Exercises - Seated Hip Flexion  - 1 x daily - 7 x weekly - 3 sets - 5 reps - Narrow Stance with Counter Support  - 3 x daily -  7 x weekly - 1 sets - 5 reps - 30 seconds hold - Standing Gastroc Stretch at Counter  - 3 x daily - 7 x weekly - 1 sets - 3 reps - 1 minute hold  - Standing Single Leg Stance with Counter Support  - 1 x daily - 7 x weekly - 3 sets - 10 reps - 5 seconds hold  - Seated Ankle Plantar Flexion with Resistance Loop  - 1 x daily - 7 x weekly - 3 sets - 10 reps  Blue band.  - Lunge with Counter Support  - 1 x daily - 7 x weekly - 3 sets - 10 reps    ASSESSMENT:  CLINICAL IMPRESSION:  Improved ability to ambulate since previous session based on subjective reports. Continued working on improving B ankle DF ROM, glute and quad strengthening and single leg balance to improve ability to ambulate as well as negotiate obstacles with decreased fall risk.  Pt tolerated session well without aggravation of symptoms.  Challenges to progress include age as well as ankle DF weakness and limited ankle DF ROM. Pt will benefit from continued skilled physical therapy services to improve strength, balance and  function.     OBJECTIVE IMPAIRMENTS: Abnormal gait, decreased balance, difficulty walking, decreased strength, improper body mechanics, and postural dysfunction.   ACTIVITY LIMITATIONS: carrying, lifting, bending, standing, squatting, stairs, transfers, toileting, dressing, and locomotion level  PARTICIPATION LIMITATIONS:   PERSONAL FACTORS: Age, Fitness, Past/current experiences, Time since onset of injury/illness/exacerbation, and 3+ comorbidities: Cancer, HTN, arthritis are also affecting patient's functional outcome.   REHAB POTENTIAL: Fair    CLINICAL DECISION MAKING: Evolving/moderate complexity; balance is worsening per pt.   EVALUATION COMPLEXITY: Moderate   GOALS: Goals reviewed with patient? Yes  SHORT TERM GOALS: Target date: 10/28/2023  Pt will be independent with his initial HEP to improve LE strength, balance, and function.  Baseline: Pt has not yet started his initial HEP (10/20/2023); no questions (11/30/2023) Goal status: MET   LONG TERM GOALS: Target date: 02/24/2024  Pt will improve his Berg Balance Test score to at least 48/56 as a demonstration of improved balance and decreased need for AD.  Baseline: 29/56 (10/20/2023); 26/56 (11/28/2023); 31/56 (12/28/2023) Goal status: ONGOING  2.  Pt will improve his B LE strength by at least 1/2 MMT grade to promote ability to perform transfers and standing tasks with less difficulty and with improved balance.  Baseline:  MMT Right eval Left eval R 11/30/2023 L 11/30/2023 R 12/28/2023 L 12/28/2023  Hip flexion 4 3- 4 4- 4 4  Hip extension (seated manually resisted) 4- 4- 4+ 4+ 5 4+  Hip abduction (seated manually resisted) 4 4 4+ 4+ 4+ 5  Knee flexion 4+ 4 5 4+ 5 4  Knee extension 5 4+ 5 5 5 5    Goal status: PARTIALLY MET   3.  Pt will improve his Activities Specific Balance Confidence (ABC) scale by at least 15% as a demonstration of improved balance.  Baseline:  63.75% (9/18/025); 76% (11/30/2023) Goal  status: PARTIALLY MET   PLAN:  PT FREQUENCY: 1-2x/week  PT DURATION: 8 weeks  PLANNED INTERVENTIONS: 97110-Therapeutic exercises, 97530- Therapeutic activity, [id number]- Neuromuscular re-education, 97535- Self Care, [id number]- Manual therapy, [phone]- Gait training, Patient/Family education, Balance training, and Stair training.  PLAN FOR NEXT SESSION: posture, trunk, hip and knee strengthening, balance training, manual techniques, modalities PRN.    Kinda Pottle, PT, DPT 01/11/2024, 10:52 AM

## 2024-01-15 ENCOUNTER — Other Ambulatory Visit: Payer: Self-pay | Admitting: Physician Assistant

## 2024-01-15 DIAGNOSIS — N401 Enlarged prostate with lower urinary tract symptoms: Secondary | ICD-10-CM

## 2024-01-16 ENCOUNTER — Other Ambulatory Visit: Payer: Self-pay | Admitting: Oncology

## 2024-01-16 ENCOUNTER — Other Ambulatory Visit: Payer: Self-pay

## 2024-01-16 ENCOUNTER — Ambulatory Visit

## 2024-01-16 DIAGNOSIS — C61 Malignant neoplasm of prostate: Secondary | ICD-10-CM

## 2024-01-16 DIAGNOSIS — R2681 Unsteadiness on feet: Secondary | ICD-10-CM

## 2024-01-16 DIAGNOSIS — M6281 Muscle weakness (generalized): Secondary | ICD-10-CM

## 2024-01-16 DIAGNOSIS — Z9181 History of falling: Secondary | ICD-10-CM

## 2024-01-16 MED ORDER — NUBEQA 300 MG PO TABS
600.0000 mg | ORAL_TABLET | Freq: Two times a day (BID) | ORAL | 1 refills | Status: DC
  Filled 2024-01-17: qty 120, 30d supply, fill #0
  Filled 2024-02-10: qty 120, 30d supply, fill #1

## 2024-01-16 NOTE — Therapy (Signed)
 OUTPATIENT PHYSICAL THERAPY TREATMENT   Patient Name: Leonard Stokes MRN: [id number] DOB:April 12, 1945, 78 y.o., male Today's Date: 01/16/2024  END OF SESSION:  PT End of Session - 01/16/24 0953     Visit Number 17    Number of Visits 33    Date for Recertification  02/24/24    PT Start Time 0954    PT Stop Time 1034    PT Time Calculation (min) 40 min    Activity Tolerance Patient tolerated treatment well    Behavior During Therapy [facility] for tasks assessed/performed                          Past Medical History:  Diagnosis Date   Anemia    Arthritis    Atherosclerosis of aorta 2018   BPH (benign prostatic hyperplasia)    Cancer (HCC) 2019   prostate   Chronic kidney disease 08/2016   kidney stone   Diverticulosis of sigmoid colon    History of kidney stones    Hypertension    Leg cramps    Past Surgical History:  Procedure Laterality Date   APPENDECTOMY     EXTRACORPOREAL SHOCK WAVE LITHOTRIPSY Right 08/19/2016   Procedure: EXTRACORPOREAL SHOCK WAVE LITHOTRIPSY (ESWL);  Surgeon: Penne Knee, MD;  Location: [facility] ORS;  Service: Urology;  Laterality: Right;   EXTRACORPOREAL SHOCK WAVE LITHOTRIPSY Left 03/24/2017   Procedure: EXTRACORPOREAL SHOCK WAVE LITHOTRIPSY (ESWL);  Surgeon: Penne Knee, MD;  Location: [facility] ORS;  Service: Urology;  Laterality: Left;   EYE MUSCLE SURGERY     INGUINAL HERNIA REPAIR Right 12/23/2017   Direct hernia, medium Ultra Pro mesh;  Surgeon: Dessa Reyes ORN, MD;  Location: [facility] OR[facility];  Service: General;  Laterality: Right;   JOINT REPLACEMENT     KNEE ARTHROPLASTY Left 11/15/2016   Procedure: COMPUTER ASSISTED TOTAL KNEE ARTHROPLASTY;  Surgeon: Mardee Lynwood SQUIBB, MD;  Location: [facility] ORS;  Service: Orthopedics;  Laterality: Left;   TOTAL KNEE ARTHROPLASTY Right    Patient Active Problem List   Diagnosis Date Noted   Thrombocytopenia 12/01/2023   Mood swing 10/05/2023   Gait instability 10/05/2023   Encounter for  monitoring androgen deprivation therapy 09/15/2023   Neoplasm related pain 09/15/2023   Prostate cancer metastatic to multiple sites [facility]) 12/01/2017   Right inguinal hernia 04/15/2017   Hypertension 11/30/2016   S/P total knee arthroplasty 11/15/2016   Personal history of tobacco use, presenting hazards to health 09/26/2014    PCP: Diedra Lame, MD   REFERRING PROVIDER: Babara Call, MD   REFERRING DIAG: C61 (ICD-10-CM) - Prostate cancer metastatic to multiple sites (HCC) R53.1 (ICD-10-CM) - Weakness  Rationale for Evaluation and Treatment: Rehabilitation  THERAPY DIAG:  Unsteadiness on feet  Muscle weakness (generalized)  History of falling  ONSET DATE: 10/05/2023 (Date PT referral signed)  SUBJECTIVE:  SUBJECTIVE STATEMENT: Walking over 1/4th a mile every day. Getting easier, less stressed. Also doing lunges too. No pain or discomfort anywhere.     Planning on going to [place] 11/2024   PERTINENT HISTORY:  Weakness, decreased balance. Uneven surfaces causes him to lose balance. Also is unsteady when he first wakes up in the morning. Difficulty with balance started about 6-8 weeks ago which is worsening. Pt was cleaning up after his dog and lost balance going forward and could not get up. His PT neighbor helped him up. Had difficulty using his L LE to get himself up.    Blood pressure is controlled per pt.  No latex allergies.    No pain currently. Pain is primarily at night, around his abdominal area.   Standing up from a seated position such as a toilet, is very difficult, needs a grab bar assist.      PAIN:  Are you having pain? Yes: NPRS scale: none at the moment.  Pain location: abdominal area and some in the back, might be cancer related.  Pain description: achy Aggravating  factors: unknown, does not have it all the time.  Relieving factors: oxycodone   PRECAUTIONS: Fall and Other: CA  RED FLAGS: Bowel or bladder incontinence: No and Cauda equina syndrome: No   WEIGHT BEARING RESTRICTIONS: No  FALLS:  Has patient fallen in last 6 months? Yes. Number of falls 6, pt states catching himself most of the time. Did not really fall, he just loss balance. Uneven surfaces causes him to lose balance  LIVING ENVIRONMENT: Lives with: lives with their spouse Lives in: House/apartment Stairs: Yes: Internal: 14 steps; on right going up and External: 2 steps; on right going up Has following equipment at home: Single point cane, Walker - 4 wheeled, and Grab bars  OCCUPATION: retired  PLOF: Independent  PATIENT GOALS: getting up from the chair and toilet, and improve B LE strength L > R  NEXT MD VISIT: about 1-2 weeks from now.  OBJECTIVE:  Note: Objective measures were completed at Evaluation unless otherwise noted.  DIAGNOSTIC FINDINGS:    PATIENT SURVEYS:  ABC scale: The Activities-Specific Balance Confidence (ABC) Scale 0% 10 20 30  40 50 60 70 80 90 100% No confidence<->completely confident  How confident are you that you will not lose your balance or become unsteady when you . . .   Date tested 10/20/2023  Walk around the house 50%  2. Walk up or down stairs 50%  3. Bend over and pick up a slipper from in front of a closet floor 0%  4. Reach for a small can off a shelf at eye level 100%  5. Stand on tip toes and reach for something above your head 50%  6. Stand on a chair and reach for something 30%  7. Sweep the floor 60%  8. Walk outside the house to a car parked in the driveway 100%  9. Get into or out of a car 100%  10. Walk across a parking lot to the mall 80%  11. Walk up or down a ramp 80%  12. Walk in a crowded mall where people rapidly walk past you 100%  13. Are bumped into by people as you walk through the mall 80%  14. Step onto or off  of an escalator while you are holding onto the railing 100%  15. Step onto or off an escalator while holding onto parcels such that you cannot hold onto the railing 40%  16. Walk  outside on icy sidewalks 0%  Total: #/16 1020%/16 = 63.75%     COGNITION: Overall cognitive status: Within functional limits for tasks assessed     SENSATION:   MUSCLE LENGTH:   POSTURE: wide base of support, hip and knee flexion, forward flexed posture, R hip ER,   PALPATION:   LUMBAR ROM:   AROM eval  Flexion in sitting  full  Extension WFL (LOB backwards in standing)  Right lateral flexion  WFL  Left lateral flexion WFL  Right rotation in sitting WFL  Left rotation in sitting WFL   (Blank rows = not tested)  LOWER EXTREMITY ROM:     Passive  Right 11/30/2023 Left 11/30/2023  Hip flexion    Hip extension    Hip abduction    Hip adduction    Hip internal rotation    Hip external rotation    Knee flexion    Knee extension    Ankle dorsiflexion -38 (-10 AAROM) -20 (-4 AAROM)  Ankle plantarflexion    Ankle inversion    Ankle eversion     (Blank rows = not tested)  LOWER EXTREMITY MMT:    MMT Right eval Left eval  Hip flexion 4 3-  Hip extension (seated manually resisted) 4- 4-  Hip abduction (seated manually resisted) 4 4  Hip adduction    Hip internal rotation    Hip external rotation    Knee flexion 4+ 4  Knee extension 5 4+  Ankle dorsiflexion    Ankle plantarflexion    Ankle inversion    Ankle eversion     (Blank rows = not tested)  LUMBAR SPECIAL TESTS:  (-) heel to shin test (+) finger to nose on L side.   Difficulty with rapid alternating palms up and down.   FUNCTIONAL TESTS:  Lars Balance Scale:  Item Test date: 10/20/2023 Date:  Date:   Sitting to standing 3. able to stand independently using hands Insert SmartPhrase OPRCBERGREEVAL Insert SmartPhrase OPRCBERGREEVAL  2. Standing unsupported 2. able to stand 30 seconds unsupported    3. Sitting with back  unsupported, feet supported 4. able to sit safely and securely for 2 minutes    4. Standing to sitting 3. controls descent by using hands    5. Pivot transfer  3. able to transfer safely with definite need of hands    6. Standing unsupported with eyes closed 3. able to stand 10 seconds with supervision    7. Standing unsupported with feet together 3. able to place feet together independently and stand 1 minute with supervision    8. Reaching forward with outstretched arms while standing 2. can reach forward 5 cm (2 inches)    9. Pick up object from the floor from standing 3. able to pick up slipper but needs supervision    10. Turning to look behind over left and right shoulders while standing 1. needs supervision when turning    11. Turn 360 degrees 1. needs close supervision or verbal cuing    12. Place alternate foot on step or stool while standing unsupported 0. needs assistance to keep from falling/unable to try    13. Standing unsupported one foot in front 1. needs help to step but can hold 15 seconds    14. Standing on one leg 0. unable to try of needs assist to prevent fall      Total Score 29/56 Total Score:    Total Score:       Score Interpretation: Score  of <19 indicates high risk of falls.  Minimally Clinically Important Difference (MCID):  =DGI scores of<21/24 = 1.80 points DGI scores of >21/24 = 0.60 points   [place]hill T, Inbar-Borovsky N, Brozgol M, Giladi N, [place] JM. The Dynamic Gait Index in healthy older adults: the role of stair climbing, fear of falling and gender. Gait Posture. 2009 Feb;29(2):237-41. doi: 10.1016/j.gaitpost.2008.08.013. Epub 2008 Oct 8. PMID: 81154560; PMCID: EFR7290501.  Pardasaney, MYRTIS LOIS Bonus, GEANNIE POUR., et al. (2012). Sensitivity to change and responsiveness of four balance measures for community-dwelling older adults. Physical therapy 92(3): 388-397.   Item Test date: 11/28/2023 Date:  Date:   Sitting to standing 3. able to stand  independently using hands Insert SmartPhrase OPRCBERGREEVAL Insert SmartPhrase OPRCBERGREEVAL  2. Standing unsupported 3. able to stand 2 minutes with supervision    3. Sitting with back unsupported, feet supported 4. able to sit safely and securely for 2 minutes    4. Standing to sitting 4. sits safely with minimal use of hands    5. Pivot transfer  3. able to transfer safely with definite need of hands    6. Standing unsupported with eyes closed 3. able to stand 10 seconds with supervision    7. Standing unsupported with feet together 0. needs help to attain position and unable to hold for 15 seconds    8. Reaching forward with outstretched arms while standing 0. loses balance while trying/requires external support    9. Pick up object from the floor from standing 3. able to pick up slipper but needs supervision    10. Turning to look behind over left and right shoulders while standing 3. looks behind one side only, other side shows less weight shift    11. Turn 360 degrees 0. needs assistance while turning    12. Place alternate foot on step or stool while standing unsupported 0. needs assistance to keep from falling/unable to try    13. Standing unsupported one foot in front 0. loses balance while stepping or standing    14. Standing on one leg 0. unable to try of needs assist to prevent fall      Total Score 26/56 Total Score:    Total Score:    Item Test date: 12/28/2023 Date:  Date:   Sitting to standing 3. able to stand independently using hands Insert SmartPhrase OPRCBERGREEVAL Insert SmartPhrase OPRCBERGREEVAL  2. Standing unsupported 4. able to stand safely for 2 minutes    3. Sitting with back unsupported, feet supported 4. able to sit safely and securely for 2 minutes    4. Standing to sitting 3. controls descent by using hands    5. Pivot transfer  3. able to transfer safely with definite need of hands    6. Standing unsupported with eyes closed 4. able to stand 10 seconds safely     7. Standing unsupported with feet together 0. needs help to attain position and unable to hold for 15 seconds    8. Reaching forward with outstretched arms while standing 2. can reach forward 5 cm (2 inches)    9. Pick up object from the floor from standing 3. able to pick up slipper but needs supervision    10. Turning to look behind over left and right shoulders while standing 3. looks behind one side only, other side shows less weight shift    11. Turn 360 degrees 0. needs assistance while turning    12. Place alternate foot on step or stool while  standing unsupported 0. needs assistance to keep from falling/unable to try    13. Standing unsupported one foot in front 2. able to take small step independently and hold 30 seconds    14. Standing on one leg 0. unable to try of needs assist to prevent fall      Total Score 31/56 Total Score:    Total Score:      GAIT: Distance walked: 30 ft Assistive device utilized: Single point cane Level of assistance: Modified independence and SBA Comments: SPC on R, decreased stance R LE, forward flexed, decreased gait velocity.   TREATMENT DATE: 01/16/2024                                                                                                                                 Therapeutic activities Performed with the intent of improving ability to perform standing tasks with less difficulty and less fall risk.    Standing with B UE assist                Ankle DF/PF on rocker board 2 minutes   Standing gastroc stretch at rocker board 1 minute x 3  Seated ankle DF AAROM with PT  R  10x5 seconds for 3 sets   L  10x5 seconds for 3 sets   Seated piriformis   R 30 seconds x 4  Seated hip IR   R 10x3  Forward step up onto first regular step with B UE assist  R 12x    Improved technique, movement at target joints, use of target muscles after mod verbal, visual, tactile cues.       PATIENT EDUCATION:  Education details:  POC Person educated: Patient Education method: Explanation Education comprehension: verbalized understanding  HOME EXERCISE PROGRAM: Access Code: [email or web address] URL: [email or web address] Date: 11/08/2023 Prepared by: Emil Glassman  Exercises - Seated Hip Flexion  - 1 x daily - 7 x weekly - 3 sets - 5 reps - Narrow Stance with Counter Support  - 3 x daily - 7 x weekly - 1 sets - 5 reps - 30 seconds hold - Standing Gastroc Stretch at Counter  - 3 x daily - 7 x weekly - 1 sets - 3 reps - 1 minute hold  - Standing Single Leg Stance with Counter Support  - 1 x daily - 7 x weekly - 3 sets - 10 reps - 5 seconds hold  - Seated Ankle Plantar Flexion with Resistance Loop  - 1 x daily - 7 x weekly - 3 sets - 10 reps  Blue band.  - Lunge with Counter Support  - 1 x daily - 7 x weekly - 3 sets - 10 reps  - Seated Piriformis Stretch with Trunk Bend  - 2-3 x daily - 7 x weekly - 1 sets - 5 reps - 30 seconds to 1 minute hold - Seated Hip Internal Rotation AROM  - 1 x daily -  7 x weekly - 3 sets - 10 reps      ASSESSMENT:  CLINICAL IMPRESSION:  Continued working on gastroc muscle stretching and AAROM ankle DF to promote forward weight shifting as well as ankle DF for foot clearance. Also worked on decreasing R piriformis muscle tension to hopefully decrease pressure on R sciatic nerve and R peroneal nerves to hopefully improve ankle DF strength and AROM.  Pt tolerated session well without aggravation of symptoms.  Challenges to progress include age as well as ankle DF weakness and limited ankle DF ROM. Pt will benefit from continued skilled physical therapy services to improve strength, balance and function.     OBJECTIVE IMPAIRMENTS: Abnormal gait, decreased balance, difficulty walking, decreased strength, improper body mechanics, and postural dysfunction.   ACTIVITY LIMITATIONS: carrying, lifting, bending, standing, squatting, stairs, transfers, toileting, dressing, and locomotion  level  PARTICIPATION LIMITATIONS:   PERSONAL FACTORS: Age, Fitness, Past/current experiences, Time since onset of injury/illness/exacerbation, and 3+ comorbidities: Cancer, HTN, arthritis are also affecting patient's functional outcome.   REHAB POTENTIAL: Fair    CLINICAL DECISION MAKING: Evolving/moderate complexity; balance is worsening per pt.   EVALUATION COMPLEXITY: Moderate   GOALS: Goals reviewed with patient? Yes  SHORT TERM GOALS: Target date: 10/28/2023  Pt will be independent with his initial HEP to improve LE strength, balance, and function.  Baseline: Pt has not yet started his initial HEP (10/20/2023); no questions (11/30/2023) Goal status: MET   LONG TERM GOALS: Target date: 02/24/2024  Pt will improve his Berg Balance Test score to at least 48/56 as a demonstration of improved balance and decreased need for AD.  Baseline: 29/56 (10/20/2023); 26/56 (11/28/2023); 31/56 (12/28/2023) Goal status: ONGOING  2.  Pt will improve his B LE strength by at least 1/2 MMT grade to promote ability to perform transfers and standing tasks with less difficulty and with improved balance.  Baseline:  MMT Right eval Left eval R 11/30/2023 L 11/30/2023 R 12/28/2023 L 12/28/2023  Hip flexion 4 3- 4 4- 4 4  Hip extension (seated manually resisted) 4- 4- 4+ 4+ 5 4+  Hip abduction (seated manually resisted) 4 4 4+ 4+ 4+ 5  Knee flexion 4+ 4 5 4+ 5 4  Knee extension 5 4+ 5 5 5 5    Goal status: PARTIALLY MET   3.  Pt will improve his Activities Specific Balance Confidence (ABC) scale by at least 15% as a demonstration of improved balance.  Baseline:  63.75% (9/18/025); 76% (11/30/2023) Goal status: PARTIALLY MET   PLAN:  PT FREQUENCY: 1-2x/week  PT DURATION: 8 weeks  PLANNED INTERVENTIONS: 97110-Therapeutic exercises, 97530- Therapeutic activity, [id number]- Neuromuscular re-education, 97535- Self Care, [id number]- Manual therapy, [phone]- Gait training, Patient/Family education, Balance  training, and Stair training.  PLAN FOR NEXT SESSION: posture, trunk, hip and knee strengthening, balance training, manual techniques, modalities PRN.    Lua Feng, PT, DPT 01/16/2024, 11:22 AM

## 2024-01-17 ENCOUNTER — Other Ambulatory Visit: Payer: Self-pay

## 2024-01-18 ENCOUNTER — Other Ambulatory Visit (HOSPITAL_COMMUNITY): Payer: Self-pay

## 2024-01-18 ENCOUNTER — Ambulatory Visit

## 2024-01-18 ENCOUNTER — Other Ambulatory Visit: Payer: Self-pay

## 2024-01-18 DIAGNOSIS — R2681 Unsteadiness on feet: Secondary | ICD-10-CM

## 2024-01-18 DIAGNOSIS — M6281 Muscle weakness (generalized): Secondary | ICD-10-CM

## 2024-01-18 DIAGNOSIS — Z9181 History of falling: Secondary | ICD-10-CM

## 2024-01-18 NOTE — Therapy (Signed)
 OUTPATIENT PHYSICAL THERAPY TREATMENT   Patient Name: Leonard Stokes MRN: [id number] DOB:[birth year], 78 y.o., male Today's Date: 01/18/2024  END OF SESSION:  PT End of Session - 01/18/24 1437     Visit Number 18    Number of Visits 33    Date for Recertification  02/24/24    PT Start Time 1437    PT Stop Time 1521    PT Time Calculation (min) 44 min    Activity Tolerance Patient tolerated treatment well    Behavior During Therapy [facility] for tasks assessed/performed                           Past Medical History:  Diagnosis Date   Anemia    Arthritis    Atherosclerosis of aorta 2018   BPH (benign prostatic hyperplasia)    Cancer (HCC) 2019   prostate   Chronic kidney disease 08/2016   kidney stone   Diverticulosis of sigmoid colon    History of kidney stones    Hypertension    Leg cramps    Past Surgical History:  Procedure Laterality Date   APPENDECTOMY     EXTRACORPOREAL SHOCK WAVE LITHOTRIPSY Right 08/19/2016   Procedure: EXTRACORPOREAL SHOCK WAVE LITHOTRIPSY (ESWL);  Surgeon: Penne Knee, MD;  Location: [facility] ORS;  Service: Urology;  Laterality: Right;   EXTRACORPOREAL SHOCK WAVE LITHOTRIPSY Left 03/24/2017   Procedure: EXTRACORPOREAL SHOCK WAVE LITHOTRIPSY (ESWL);  Surgeon: Penne Knee, MD;  Location: [facility] OR[facility];  Service: Urology;  Laterality: Left;   EYE MUSCLE SURGERY     INGUINAL HERNIA REPAIR Right 12/23/2017   Direct hernia, medium Ultra Pro mesh;  Surgeon: Dessa Reyes ORN, MD;  Location: [facility] OR[facility];  Service: General;  Laterality: Right;   JOINT REPLACEMENT     KNEE ARTHROPLASTY Left 11/15/2016   Procedure: COMPUTER ASSISTED TOTAL KNEE ARTHROPLASTY;  Surgeon: Mardee Lynwood SQUIBB, MD;  Location: [facility] OR[facility];  Service: Orthopedics;  Laterality: Left;   TOTAL KNEE ARTHROPLASTY Right    Patient Active Problem List   Diagnosis Date Noted   Thrombocytopenia 12/01/2023   Mood swing 10/05/2023   Gait instability 10/05/2023   Encounter for  monitoring androgen deprivation therapy 09/15/2023   Neoplasm related pain 09/15/2023   Prostate cancer metastatic to multiple sites [facility]) 12/01/2017   Right inguinal hernia 04/15/2017   Hypertension 11/30/2016   S/P total knee arthroplasty 11/15/2016   Personal history of tobacco use, presenting hazards to health 09/26/2014    PCP: Diedra Lame, MD   REFERRING PROVIDER: Babara Call, MD   REFERRING DIAG: C61 (ICD-10-CM) - Prostate cancer metastatic to multiple sites (HCC) R53.1 (ICD-10-CM) - Weakness  Rationale for Evaluation and Treatment: Rehabilitation  THERAPY DIAG:  Unsteadiness on feet  Muscle weakness (generalized)  History of falling  ONSET DATE: 10/05/2023 (Date PT referral signed)  SUBJECTIVE:  SUBJECTIVE STATEMENT: Walking 1/4th a mile every day.     Going to [place] the week after Christmas.   Planning on going to [place] 11/2024   PERTINENT HISTORY:  Weakness, decreased balance. Uneven surfaces causes him to lose balance. Also is unsteady when he first wakes up in the morning. Difficulty with balance started about 6-8 weeks ago which is worsening. Pt was cleaning up after his dog and lost balance going forward and could not get up. His PT neighbor helped him up. Had difficulty using his L LE to get himself up.    Blood pressure is controlled per pt.  No latex allergies.    No pain currently. Pain is primarily at night, around his abdominal area.   Standing up from a seated position such as a toilet, is very difficult, needs a grab bar assist.      PAIN:  Are you having pain? Yes: NPRS scale: none at the moment.  Pain location: abdominal area and some in the back, might be cancer related.  Pain description: achy Aggravating factors: unknown, does not have it all  the time.  Relieving factors: oxycodone   PRECAUTIONS: Fall and Other: CA  RED FLAGS: Bowel or bladder incontinence: No and Cauda equina syndrome: No   WEIGHT BEARING RESTRICTIONS: No  FALLS:  Has patient fallen in last 6 months? Yes. Number of falls 6, pt states catching himself most of the time. Did not really fall, he just loss balance. Uneven surfaces causes him to lose balance  LIVING ENVIRONMENT: Lives with: lives with their spouse Lives in: House/apartment Stairs: Yes: Internal: 14 steps; on right going up and External: 2 steps; on right going up Has following equipment at home: Single point cane, Walker - 4 wheeled, and Grab bars  OCCUPATION: retired  PLOF: Independent  PATIENT GOALS: getting up from the chair and toilet, and improve B LE strength L > R  NEXT MD VISIT: about 1-2 weeks from now.  OBJECTIVE:  Note: Objective measures were completed at Evaluation unless otherwise noted.  DIAGNOSTIC FINDINGS:    PATIENT SURVEYS:  ABC scale: The Activities-Specific Balance Confidence (ABC) Scale 0% 10 20 30  40 50 60 70 80 90 100% No confidence<->completely confident  How confident are you that you will not lose your balance or become unsteady when you . . .   Date tested 10/20/2023  Walk around the house 50%  2. Walk up or down stairs 50%  3. Bend over and pick up a slipper from in front of a closet floor 0%  4. Reach for a small can off a shelf at eye level 100%  5. Stand on tip toes and reach for something above your head 50%  6. Stand on a chair and reach for something 30%  7. Sweep the floor 60%  8. Walk outside the house to a car parked in the driveway 100%  9. Get into or out of a car 100%  10. Walk across a parking lot to the mall 80%  11. Walk up or down a ramp 80%  12. Walk in a crowded mall where people rapidly walk past you 100%  13. Are bumped into by people as you walk through the mall 80%  14. Step onto or off of an escalator while you are holding  onto the railing 100%  15. Step onto or off an escalator while holding onto parcels such that you cannot hold onto the railing 40%  16. Walk outside on icy  sidewalks 0%  Total: #/16 1020%/16 = 63.75%     COGNITION: Overall cognitive status: Within functional limits for tasks assessed     SENSATION:   MUSCLE LENGTH:   POSTURE: wide base of support, hip and knee flexion, forward flexed posture, R hip ER,   PALPATION:   LUMBAR ROM:   AROM eval  Flexion in sitting  full  Extension WFL (LOB backwards in standing)  Right lateral flexion  WFL  Left lateral flexion WFL  Right rotation in sitting WFL  Left rotation in sitting WFL   (Blank rows = not tested)  LOWER EXTREMITY ROM:     Passive  Right 11/30/2023 Left 11/30/2023  Hip flexion    Hip extension    Hip abduction    Hip adduction    Hip internal rotation    Hip external rotation    Knee flexion    Knee extension    Ankle dorsiflexion -38 (-10 AAROM) -20 (-4 AAROM)  Ankle plantarflexion    Ankle inversion    Ankle eversion     (Blank rows = not tested)  LOWER EXTREMITY MMT:    MMT Right eval Left eval  Hip flexion 4 3-  Hip extension (seated manually resisted) 4- 4-  Hip abduction (seated manually resisted) 4 4  Hip adduction    Hip internal rotation    Hip external rotation    Knee flexion 4+ 4  Knee extension 5 4+  Ankle dorsiflexion    Ankle plantarflexion    Ankle inversion    Ankle eversion     (Blank rows = not tested)  LUMBAR SPECIAL TESTS:  (-) heel to shin test (+) finger to nose on L side.   Difficulty with rapid alternating palms up and down.   FUNCTIONAL TESTS:  Lars Balance Scale:  Item Test date: 10/20/2023 Date:  Date:   Sitting to standing 3. able to stand independently using hands Insert SmartPhrase OPRCBERGREEVAL Insert SmartPhrase OPRCBERGREEVAL  2. Standing unsupported 2. able to stand 30 seconds unsupported    3. Sitting with back unsupported, feet supported 4. able to  sit safely and securely for 2 minutes    4. Standing to sitting 3. controls descent by using hands    5. Pivot transfer  3. able to transfer safely with definite need of hands    6. Standing unsupported with eyes closed 3. able to stand 10 seconds with supervision    7. Standing unsupported with feet together 3. able to place feet together independently and stand 1 minute with supervision    8. Reaching forward with outstretched arms while standing 2. can reach forward 5 cm (2 inches)    9. Pick up object from the floor from standing 3. able to pick up slipper but needs supervision    10. Turning to look behind over left and right shoulders while standing 1. needs supervision when turning    11. Turn 360 degrees 1. needs close supervision or verbal cuing    12. Place alternate foot on step or stool while standing unsupported 0. needs assistance to keep from falling/unable to try    13. Standing unsupported one foot in front 1. needs help to step but can hold 15 seconds    14. Standing on one leg 0. unable to try of needs assist to prevent fall      Total Score 29/56 Total Score:    Total Score:       Score Interpretation: Score of <19 indicates  high risk of falls.  Minimally Clinically Important Difference (MCID):  =DGI scores of<21/24 = 1.80 points DGI scores of >21/24 = 0.60 points   Kanab T, Inbar-Borovsky N, Brozgol M, Giladi N, [place] JM. The Dynamic Gait Index in healthy older adults: the role of stair climbing, fear of falling and gender. Gait Posture. 2009 Feb;29(2):237-41. doi: 10.1016/j.gaitpost.2008.08.013. Epub 2008 Oct 8. PMID: 81154560; PMCID: EFR7290501.  Pardasaney, MYRTIS LOIS Bonus, GEANNIE POUR., et al. (2012). Sensitivity to change and responsiveness of four balance measures for community-dwelling older adults. Physical therapy 92(3): 388-397.   Item Test date: 11/28/2023 Date:  Date:   Sitting to standing 3. able to stand independently using hands Insert SmartPhrase  OPRCBERGREEVAL Insert SmartPhrase OPRCBERGREEVAL  2. Standing unsupported 3. able to stand 2 minutes with supervision    3. Sitting with back unsupported, feet supported 4. able to sit safely and securely for 2 minutes    4. Standing to sitting 4. sits safely with minimal use of hands    5. Pivot transfer  3. able to transfer safely with definite need of hands    6. Standing unsupported with eyes closed 3. able to stand 10 seconds with supervision    7. Standing unsupported with feet together 0. needs help to attain position and unable to hold for 15 seconds    8. Reaching forward with outstretched arms while standing 0. loses balance while trying/requires external support    9. Pick up object from the floor from standing 3. able to pick up slipper but needs supervision    10. Turning to look behind over left and right shoulders while standing 3. looks behind one side only, other side shows less weight shift    11. Turn 360 degrees 0. needs assistance while turning    12. Place alternate foot on step or stool while standing unsupported 0. needs assistance to keep from falling/unable to try    13. Standing unsupported one foot in front 0. loses balance while stepping or standing    14. Standing on one leg 0. unable to try of needs assist to prevent fall      Total Score 26/56 Total Score:    Total Score:    Item Test date: 12/28/2023 Date:  Date:   Sitting to standing 3. able to stand independently using hands Insert SmartPhrase OPRCBERGREEVAL Insert SmartPhrase OPRCBERGREEVAL  2. Standing unsupported 4. able to stand safely for 2 minutes    3. Sitting with back unsupported, feet supported 4. able to sit safely and securely for 2 minutes    4. Standing to sitting 3. controls descent by using hands    5. Pivot transfer  3. able to transfer safely with definite need of hands    6. Standing unsupported with eyes closed 4. able to stand 10 seconds safely    7. Standing unsupported with feet  together 0. needs help to attain position and unable to hold for 15 seconds    8. Reaching forward with outstretched arms while standing 2. can reach forward 5 cm (2 inches)    9. Pick up object from the floor from standing 3. able to pick up slipper but needs supervision    10. Turning to look behind over left and right shoulders while standing 3. looks behind one side only, other side shows less weight shift    11. Turn 360 degrees 0. needs assistance while turning    12. Place alternate foot on step or stool while standing unsupported 0.  needs assistance to keep from falling/unable to try    13. Standing unsupported one foot in front 2. able to take small step independently and hold 30 seconds    14. Standing on one leg 0. unable to try of needs assist to prevent fall      Total Score 31/56 Total Score:    Total Score:      GAIT: Distance walked: 30 ft Assistive device utilized: Single point cane Level of assistance: Modified independence and SBA Comments: SPC on R, decreased stance R LE, forward flexed, decreased gait velocity.   TREATMENT DATE: 01/18/2024                                                                                                                                 Therapeutic activities Performed with the intent of improving ability to perform standing tasks with less difficulty and less fall risk.    Standing with B UE assist                Ankle DF/PF on rocker board 2 minutes   Standing gastroc stretch at rocker board 1 minute x 3  Recommended use of rollator for his [place] trip to be able to ambulate longer distances.   Seated piriformis   R 1 min x 2  Seated hip IR   R 10x2  Seated ankle DF AAROM with PT  R  10x5 seconds for 3 sets   L  10x5 seconds for 3 sets  Stepping over 2 mini hurdles with one UE assist 10x  Then without UE assist  4x. Difficulty with single leg balance  S/L hip abduction   R 5x3  L 5x3    Improved technique, movement at  target joints, use of target muscles after mod verbal, visual, tactile cues.       PATIENT EDUCATION:  Education details: POC Person educated: Patient Education method: Explanation Education comprehension: verbalized understanding  HOME EXERCISE PROGRAM: Access Code: [email or web address] URL: [email or web address] Date: 11/08/2023 Prepared by: Emil Glassman  Exercises - Seated Hip Flexion  - 1 x daily - 7 x weekly - 3 sets - 5 reps - Narrow Stance with Counter Support  - 3 x daily - 7 x weekly - 1 sets - 5 reps - 30 seconds hold - Standing Gastroc Stretch at Counter  - 3 x daily - 7 x weekly - 1 sets - 3 reps - 1 minute hold  - Standing Single Leg Stance with Counter Support  - 1 x daily - 7 x weekly - 3 sets - 10 reps - 5 seconds hold  - Seated Ankle Plantar Flexion with Resistance Loop  - 1 x daily - 7 x weekly - 3 sets - 10 reps  Blue band.  - Lunge with Counter Support  - 1 x daily - 7 x weekly - 3 sets - 10 reps  - Seated Piriformis Stretch with Trunk Bend  -  2-3 x daily - 7 x weekly - 1 sets - 5 reps - 30 seconds to 1 minute hold - Seated Hip Internal Rotation AROM  - 1 x daily - 7 x weekly - 3 sets - 10 reps  - Sidelying Hip Abduction  - 1 x daily - 7 x weekly - 2-3 sets - 5 reps    ASSESSMENT:  CLINICAL IMPRESSION:  Added glute med muscle strengthening to promote single leg stance balance with obstacle negotiation. Pt demonstrates tendency for pelvic drop and lumbar side bend compensation resulting in loss of balance when stepping over mini hurdles. Continued working on improving ankle DF to promote forward weight shifting with gait and obstacle negotiation as well to help decrease fall risk. Pt tolerated session well without aggravation of symptoms.  Challenges to progress include age as well as ankle DF weakness and limited ankle DF ROM. Pt will benefit from continued skilled physical therapy services to improve strength, balance and function.     OBJECTIVE  IMPAIRMENTS: Abnormal gait, decreased balance, difficulty walking, decreased strength, improper body mechanics, and postural dysfunction.   ACTIVITY LIMITATIONS: carrying, lifting, bending, standing, squatting, stairs, transfers, toileting, dressing, and locomotion level  PARTICIPATION LIMITATIONS:   PERSONAL FACTORS: Age, Fitness, Past/current experiences, Time since onset of injury/illness/exacerbation, and 3+ comorbidities: Cancer, HTN, arthritis are also affecting patient's functional outcome.   REHAB POTENTIAL: Fair    CLINICAL DECISION MAKING: Evolving/moderate complexity; balance is worsening per pt.   EVALUATION COMPLEXITY: Moderate   GOALS: Goals reviewed with patient? Yes  SHORT TERM GOALS: Target date: 10/28/2023  Pt will be independent with his initial HEP to improve LE strength, balance, and function.  Baseline: Pt has not yet started his initial HEP (10/20/2023); no questions (11/30/2023) Goal status: MET   LONG TERM GOALS: Target date: 02/24/2024  Pt will improve his Berg Balance Test score to at least 48/56 as a demonstration of improved balance and decreased need for AD.  Baseline: 29/56 (10/20/2023); 26/56 (11/28/2023); 31/56 (12/28/2023) Goal status: ONGOING  2.  Pt will improve his B LE strength by at least 1/2 MMT grade to promote ability to perform transfers and standing tasks with less difficulty and with improved balance.  Baseline:  MMT Right eval Left eval R 11/30/2023 L 11/30/2023 R 12/28/2023 L 12/28/2023  Hip flexion 4 3- 4 4- 4 4  Hip extension (seated manually resisted) 4- 4- 4+ 4+ 5 4+  Hip abduction (seated manually resisted) 4 4 4+ 4+ 4+ 5  Knee flexion 4+ 4 5 4+ 5 4  Knee extension 5 4+ 5 5 5 5    Goal status: PARTIALLY MET   3.  Pt will improve his Activities Specific Balance Confidence (ABC) scale by at least 15% as a demonstration of improved balance.  Baseline:  63.75% (9/18/025); 76% (11/30/2023) Goal status: PARTIALLY  MET   PLAN:  PT FREQUENCY: 1-2x/week  PT DURATION: 8 weeks  PLANNED INTERVENTIONS: 97110-Therapeutic exercises, 97530- Therapeutic activity, [id number]- Neuromuscular re-education, 97535- Self Care, [id number]- Manual therapy, [phone]- Gait training, Patient/Family education, Balance training, and Stair training.  PLAN FOR NEXT SESSION: posture, trunk, hip and knee strengthening, balance training, manual techniques, modalities PRN.    Kannon Granderson, PT, DPT 01/18/2024, 3:31 PM

## 2024-01-18 NOTE — Progress Notes (Signed)
 Specialty Pharmacy Refill Coordination Note  Spoke with Leonard Stokes is a 78 y.o. male contacted today regarding refills of specialty medication(s) Darolutamide  (Nubeqa )  Doses on hand: 6 (24 tabs)  Patient requested: Delivery   Delivery date: 01/20/24   Verified address: [address] [place] [address]  Medication will be filled on 01/19/24

## 2024-01-19 ENCOUNTER — Other Ambulatory Visit: Payer: Self-pay

## 2024-01-24 ENCOUNTER — Ambulatory Visit

## 2024-01-24 DIAGNOSIS — Z9181 History of falling: Secondary | ICD-10-CM

## 2024-01-24 DIAGNOSIS — R2681 Unsteadiness on feet: Secondary | ICD-10-CM | POA: Diagnosis not present

## 2024-01-24 DIAGNOSIS — M6281 Muscle weakness (generalized): Secondary | ICD-10-CM

## 2024-01-24 NOTE — Therapy (Signed)
 " OUTPATIENT PHYSICAL THERAPY TREATMENT   Patient Name: Leonard Stokes MRN: [id number] DOB:[birth year], 78 y.o., male Today's Date: 01/24/2024  END OF SESSION:  PT End of Session - 01/24/24 1350     Visit Number 19    Number of Visits 33    Date for Recertification  02/24/24    PT Start Time 1350    PT Stop Time 1429    PT Time Calculation (min) 39 min    Activity Tolerance Patient tolerated treatment well    Behavior During Therapy [facility] for tasks assessed/performed                            Past Medical History:  Diagnosis Date   Anemia    Arthritis    Atherosclerosis of aorta 2018   BPH (benign prostatic hyperplasia)    Cancer (HCC) 2019   prostate   Chronic kidney disease 08/2016   kidney stone   Diverticulosis of sigmoid colon    History of kidney stones    Hypertension    Leg cramps    Past Surgical History:  Procedure Laterality Date   APPENDECTOMY     EXTRACORPOREAL SHOCK WAVE LITHOTRIPSY Right 08/19/2016   Procedure: EXTRACORPOREAL SHOCK WAVE LITHOTRIPSY (ESWL);  Surgeon: Penne Knee, MD;  Location: [facility] OR[facility];  Service: Urology;  Laterality: Right;   EXTRACORPOREAL SHOCK WAVE LITHOTRIPSY Left 03/24/2017   Procedure: EXTRACORPOREAL SHOCK WAVE LITHOTRIPSY (ESWL);  Surgeon: Penne Knee, MD;  Location: [facility];  Service: Urology;  Laterality: Left;   EYE MUSCLE SURGERY     INGUINAL HERNIA REPAIR Right 12/23/2017   Direct hernia, medium Ultra Pro mesh;  Surgeon: Dessa Reyes ORN, MD;  Location: [facility];  Service: General;  Laterality: Right;   JOINT REPLACEMENT     KNEE ARTHROPLASTY Left 11/15/2016   Procedure: COMPUTER ASSISTED TOTAL KNEE ARTHROPLASTY;  Surgeon: Mardee Lynwood SQUIBB, MD;  Location: [facility];  Service: Orthopedics;  Laterality: Left;   TOTAL KNEE ARTHROPLASTY Right    Patient Active Problem List   Diagnosis Date Noted   Thrombocytopenia 12/01/2023   Mood swing 10/05/2023   Gait instability 10/05/2023   Encounter for  monitoring androgen deprivation therapy 09/15/2023   Neoplasm related pain 09/15/2023   Prostate cancer metastatic to multiple sites [facility]) 12/01/2017   Right inguinal hernia 04/15/2017   Hypertension 11/30/2016   S/P total knee arthroplasty 11/15/2016   Personal history of tobacco use, presenting hazards to health 09/26/2014    PCP: Diedra Lame, MD   REFERRING PROVIDER: Babara Call, MD   REFERRING DIAG: C61 (ICD-10-CM) - Prostate cancer metastatic to multiple sites (HCC) R53.1 (ICD-10-CM) - Weakness  Rationale for Evaluation and Treatment: Rehabilitation  THERAPY DIAG:  Unsteadiness on feet  Muscle weakness (generalized)  History of falling  ONSET DATE: 10/05/2023 (Date PT referral signed)  SUBJECTIVE:  SUBJECTIVE STATEMENT: Had a rough morning. Some days he feels more off balance and feels like he is going backward. No dizziness, room does not look or feel like it is spinning.   His cancer has spread to his chest and R arm.     Going to [place] the week after Christmas.   Planning on going to [place] 11/2024   PERTINENT HISTORY:  Weakness, decreased balance. Uneven surfaces causes him to lose balance. Also is unsteady when he first wakes up in the morning. Difficulty with balance started about 6-8 weeks ago which is worsening. Pt was cleaning up after his dog and lost balance going forward and could not get up. His PT neighbor helped him up. Had difficulty using his L LE to get himself up.    Blood pressure is controlled per pt.  No latex allergies.    No pain currently. Pain is primarily at night, around his abdominal area.   Standing up from a seated position such as a toilet, is very difficult, needs a grab bar assist.      PAIN:  Are you having pain? Yes: NPRS scale:  none at the moment.  Pain location: abdominal area and some in the back, might be cancer related.  Pain description: achy Aggravating factors: unknown, does not have it all the time.  Relieving factors: oxycodone   PRECAUTIONS: Fall and Other: CA  RED FLAGS: Bowel or bladder incontinence: No and Cauda equina syndrome: No   WEIGHT BEARING RESTRICTIONS: No  FALLS:  Has patient fallen in last 6 months? Yes. Number of falls 6, pt states catching himself most of the time. Did not really fall, he just loss balance. Uneven surfaces causes him to lose balance  LIVING ENVIRONMENT: Lives with: lives with their spouse Lives in: House/apartment Stairs: Yes: Internal: 14 steps; on right going up and External: 2 steps; on right going up Has following equipment at home: Single point cane, Walker - 4 wheeled, and Grab bars  OCCUPATION: retired  PLOF: Independent  PATIENT GOALS: getting up from the chair and toilet, and improve B LE strength L > R  NEXT MD VISIT: about 1-2 weeks from now.  OBJECTIVE:  Note: Objective measures were completed at Evaluation unless otherwise noted.  DIAGNOSTIC FINDINGS:    PATIENT SURVEYS:  ABC scale: The Activities-Specific Balance Confidence (ABC) Scale 0% 10 20 30  40 50 60 70 80 90 100% No confidence<->completely confident  How confident are you that you will not lose your balance or become unsteady when you . . .   Date tested 10/20/2023  Walk around the house 50%  2. Walk up or down stairs 50%  3. Bend over and pick up a slipper from in front of a closet floor 0%  4. Reach for a small can off a shelf at eye level 100%  5. Stand on tip toes and reach for something above your head 50%  6. Stand on a chair and reach for something 30%  7. Sweep the floor 60%  8. Walk outside the house to a car parked in the driveway 100%  9. Get into or out of a car 100%  10. Walk across a parking lot to the mall 80%  11. Walk up or down a ramp 80%  12. Walk in a  crowded mall where people rapidly walk past you 100%  13. Are bumped into by people as you walk through the mall 80%  14. Step onto or off of an  escalator while you are holding onto the railing 100%  15. Step onto or off an escalator while holding onto parcels such that you cannot hold onto the railing 40%  16. Walk outside on icy sidewalks 0%  Total: #/16 1020%/16 = 63.75%     COGNITION: Overall cognitive status: Within functional limits for tasks assessed     SENSATION:   MUSCLE LENGTH:   POSTURE: wide base of support, hip and knee flexion, forward flexed posture, R hip ER,   PALPATION:   LUMBAR ROM:   AROM eval  Flexion in sitting  full  Extension WFL (LOB backwards in standing)  Right lateral flexion  WFL  Left lateral flexion WFL  Right rotation in sitting WFL  Left rotation in sitting WFL   (Blank rows = not tested)  LOWER EXTREMITY ROM:     Passive  Right 11/30/2023 Left 11/30/2023  Hip flexion    Hip extension    Hip abduction    Hip adduction    Hip internal rotation    Hip external rotation    Knee flexion    Knee extension    Ankle dorsiflexion -38 (-10 AAROM) -20 (-4 AAROM)  Ankle plantarflexion    Ankle inversion    Ankle eversion     (Blank rows = not tested)  LOWER EXTREMITY MMT:    MMT Right eval Left eval  Hip flexion 4 3-  Hip extension (seated manually resisted) 4- 4-  Hip abduction (seated manually resisted) 4 4  Hip adduction    Hip internal rotation    Hip external rotation    Knee flexion 4+ 4  Knee extension 5 4+  Ankle dorsiflexion    Ankle plantarflexion    Ankle inversion    Ankle eversion     (Blank rows = not tested)  LUMBAR SPECIAL TESTS:  (-) heel to shin test (+) finger to nose on L side.   Difficulty with rapid alternating palms up and down.   FUNCTIONAL TESTS:  Lars Balance Scale:  Item Test date: 10/20/2023 Date:  Date:   Sitting to standing 3. able to stand independently using hands Insert SmartPhrase  OPRCBERGREEVAL Insert SmartPhrase OPRCBERGREEVAL  2. Standing unsupported 2. able to stand 30 seconds unsupported    3. Sitting with back unsupported, feet supported 4. able to sit safely and securely for 2 minutes    4. Standing to sitting 3. controls descent by using hands    5. Pivot transfer  3. able to transfer safely with definite need of hands    6. Standing unsupported with eyes closed 3. able to stand 10 seconds with supervision    7. Standing unsupported with feet together 3. able to place feet together independently and stand 1 minute with supervision    8. Reaching forward with outstretched arms while standing 2. can reach forward 5 cm (2 inches)    9. Pick up object from the floor from standing 3. able to pick up slipper but needs supervision    10. Turning to look behind over left and right shoulders while standing 1. needs supervision when turning    11. Turn 360 degrees 1. needs close supervision or verbal cuing    12. Place alternate foot on step or stool while standing unsupported 0. needs assistance to keep from falling/unable to try    13. Standing unsupported one foot in front 1. needs help to step but can hold 15 seconds    14. Standing on one leg 0.  unable to try of needs assist to prevent fall      Total Score 29/56 Total Score:    Total Score:       Score Interpretation: Score of <19 indicates high risk of falls.  Minimally Clinically Important Difference (MCID):  =DGI scores of<21/24 = 1.80 points DGI scores of >21/24 = 0.60 points   [place] T, Inbar-Borovsky N, Brozgol M, Giladi N, [place] JM. The Dynamic Gait Index in healthy older adults: the role of stair climbing, fear of falling and gender. Gait Posture. 2009 Feb;29(2):237-41. doi: 10.1016/j.gaitpost.2008.08.013. Epub 2008 Oct 8. PMID: 81154560; PMCID: EFR7290501.  Pardasaney, MYRTIS LOIS Bonus, GEANNIE POUR., et al. (2012). Sensitivity to change and responsiveness of four balance measures for community-dwelling  older adults. Physical therapy 92(3): 388-397.   Item Test date: 11/28/2023 Date:  Date:   Sitting to standing 3. able to stand independently using hands Insert SmartPhrase OPRCBERGREEVAL Insert SmartPhrase OPRCBERGREEVAL  2. Standing unsupported 3. able to stand 2 minutes with supervision    3. Sitting with back unsupported, feet supported 4. able to sit safely and securely for 2 minutes    4. Standing to sitting 4. sits safely with minimal use of hands    5. Pivot transfer  3. able to transfer safely with definite need of hands    6. Standing unsupported with eyes closed 3. able to stand 10 seconds with supervision    7. Standing unsupported with feet together 0. needs help to attain position and unable to hold for 15 seconds    8. Reaching forward with outstretched arms while standing 0. loses balance while trying/requires external support    9. Pick up object from the floor from standing 3. able to pick up slipper but needs supervision    10. Turning to look behind over left and right shoulders while standing 3. looks behind one side only, other side shows less weight shift    11. Turn 360 degrees 0. needs assistance while turning    12. Place alternate foot on step or stool while standing unsupported 0. needs assistance to keep from falling/unable to try    13. Standing unsupported one foot in front 0. loses balance while stepping or standing    14. Standing on one leg 0. unable to try of needs assist to prevent fall      Total Score 26/56 Total Score:    Total Score:    Item Test date: 12/28/2023 Date:  Date:   Sitting to standing 3. able to stand independently using hands Insert SmartPhrase OPRCBERGREEVAL Insert SmartPhrase OPRCBERGREEVAL  2. Standing unsupported 4. able to stand safely for 2 minutes    3. Sitting with back unsupported, feet supported 4. able to sit safely and securely for 2 minutes    4. Standing to sitting 3. controls descent by using hands    5. Pivot transfer  3.  able to transfer safely with definite need of hands    6. Standing unsupported with eyes closed 4. able to stand 10 seconds safely    7. Standing unsupported with feet together 0. needs help to attain position and unable to hold for 15 seconds    8. Reaching forward with outstretched arms while standing 2. can reach forward 5 cm (2 inches)    9. Pick up object from the floor from standing 3. able to pick up slipper but needs supervision    10. Turning to look behind over left and right shoulders while standing 3. looks behind  one side only, other side shows less weight shift    11. Turn 360 degrees 0. needs assistance while turning    12. Place alternate foot on step or stool while standing unsupported 0. needs assistance to keep from falling/unable to try    13. Standing unsupported one foot in front 2. able to take small step independently and hold 30 seconds    14. Standing on one leg 0. unable to try of needs assist to prevent fall      Total Score 31/56 Total Score:    Total Score:      GAIT: Distance walked: 30 ft Assistive device utilized: Single point cane Level of assistance: Modified independence and SBA Comments: SPC on R, decreased stance R LE, forward flexed, decreased gait velocity.   TREATMENT DATE: 01/24/2024                                                                                                                                 Therapeutic activities Performed with the intent of improving ability to perform standing tasks with less difficulty and less fall risk.   S/L hip abduction   R 5x3  L 5x3  Sit <> stand from regular chair with arms, B UE assist to stand, minimal UE assist to sit. 5x2  Forward step up onto first regular step with B UE assist   R 10x3  L 10x3  Standing with B UE assist   Hip abduction    R 10x, then 10x5 seconds for 3 sets   L 10x, then 10x5 seconds for 3 sets    Improved technique, movement at target joints, use of target  muscles after mod verbal, visual, tactile cues.       PATIENT EDUCATION:  Education details: POC Person educated: Patient Education method: Explanation Education comprehension: verbalized understanding  HOME EXERCISE PROGRAM: Access Code: [email or web address] URL: [email or web address] Date: 11/08/2023 Prepared by: Emil Glassman  Exercises - Seated Hip Flexion  - 1 x daily - 7 x weekly - 3 sets - 5 reps - Narrow Stance with Counter Support  - 3 x daily - 7 x weekly - 1 sets - 5 reps - 30 seconds hold - Standing Gastroc Stretch at Counter  - 3 x daily - 7 x weekly - 1 sets - 3 reps - 1 minute hold  - Standing Single Leg Stance with Counter Support  - 1 x daily - 7 x weekly - 3 sets - 10 reps - 5 seconds hold  - Seated Ankle Plantar Flexion with Resistance Loop  - 1 x daily - 7 x weekly - 3 sets - 10 reps  Blue band.  - Lunge with Counter Support  - 1 x daily - 7 x weekly - 3 sets - 10 reps  - Seated Piriformis Stretch with Trunk Bend  - 2-3 x daily - 7 x weekly - 1 sets - 5 reps - 30 seconds to  1 minute hold - Seated Hip Internal Rotation AROM  - 1 x daily - 7 x weekly - 3 sets - 10 reps  - Sidelying Hip Abduction  - 1 x daily - 7 x weekly - 2-3 sets - 5 reps    ASSESSMENT:  CLINICAL IMPRESSION:  More difficulty performing exercises/activities such as standing up from sitting today observed. Possible slight regression in strength in which medical history may play a factor. Continued working on improving glute med and functional LE strength to improve ability to ambulate, negotiate obstacles and perform transfers with less difficulty as well as to decrease fall risk. Good muscle use felt during exercises.  Pt tolerated session well without aggravation of symptoms.  Pt will benefit from continued skilled physical therapy services to improve strength, balance and function.     OBJECTIVE IMPAIRMENTS: Abnormal gait, decreased balance, difficulty walking, decreased strength,  improper body mechanics, and postural dysfunction.   ACTIVITY LIMITATIONS: carrying, lifting, bending, standing, squatting, stairs, transfers, toileting, dressing, and locomotion level  PARTICIPATION LIMITATIONS:   PERSONAL FACTORS: Age, Fitness, Past/current experiences, Time since onset of injury/illness/exacerbation, and 3+ comorbidities: Cancer, HTN, arthritis are also affecting patient's functional outcome.   REHAB POTENTIAL: Fair    CLINICAL DECISION MAKING: Evolving/moderate complexity; balance is worsening per pt.   EVALUATION COMPLEXITY: Moderate   GOALS: Goals reviewed with patient? Yes  SHORT TERM GOALS: Target date: 10/28/2023  Pt will be independent with his initial HEP to improve LE strength, balance, and function.  Baseline: Pt has not yet started his initial HEP (10/20/2023); no questions (11/30/2023) Goal status: MET   LONG TERM GOALS: Target date: 02/24/2024  Pt will improve his Berg Balance Test score to at least 48/56 as a demonstration of improved balance and decreased need for AD.  Baseline: 29/56 (10/20/2023); 26/56 (11/28/2023); 31/56 (12/28/2023) Goal status: ONGOING  2.  Pt will improve his B LE strength by at least 1/2 MMT grade to promote ability to perform transfers and standing tasks with less difficulty and with improved balance.  Baseline:  MMT Right eval Left eval R 11/30/2023 L 11/30/2023 R 12/28/2023 L 12/28/2023  Hip flexion 4 3- 4 4- 4 4  Hip extension (seated manually resisted) 4- 4- 4+ 4+ 5 4+  Hip abduction (seated manually resisted) 4 4 4+ 4+ 4+ 5  Knee flexion 4+ 4 5 4+ 5 4  Knee extension 5 4+ 5 5 5 5    Goal status: PARTIALLY MET   3.  Pt will improve his Activities Specific Balance Confidence (ABC) scale by at least 15% as a demonstration of improved balance.  Baseline:  63.75% (9/18/025); 76% (11/30/2023) Goal status: PARTIALLY MET   PLAN:  PT FREQUENCY: 1-2x/week  PT DURATION: 8 weeks  PLANNED INTERVENTIONS:  97110-Therapeutic exercises, 97530- Therapeutic activity, [id number]- Neuromuscular re-education, 97535- Self Care, [id number]- Manual therapy, [id number]- Gait training, Patient/Family education, Balance training, and Stair training.  PLAN FOR NEXT SESSION: posture, trunk, hip and knee strengthening, balance training, manual techniques, modalities PRN.    Kateryn Marasigan, PT, DPT 01/24/2024, 2:40 PM  "

## 2024-01-31 ENCOUNTER — Ambulatory Visit

## 2024-02-03 ENCOUNTER — Ambulatory Visit

## 2024-02-06 ENCOUNTER — Ambulatory Visit: Admitting: Physician Assistant

## 2024-02-07 ENCOUNTER — Telehealth: Admitting: Hospice and Palliative Medicine

## 2024-02-07 ENCOUNTER — Ambulatory Visit

## 2024-02-08 ENCOUNTER — Inpatient Hospital Stay: Attending: Hospice and Palliative Medicine

## 2024-02-08 ENCOUNTER — Ambulatory Visit: Attending: Oncology

## 2024-02-08 DIAGNOSIS — Z9181 History of falling: Secondary | ICD-10-CM | POA: Insufficient documentation

## 2024-02-08 DIAGNOSIS — Z803 Family history of malignant neoplasm of breast: Secondary | ICD-10-CM | POA: Diagnosis not present

## 2024-02-08 DIAGNOSIS — R2681 Unsteadiness on feet: Secondary | ICD-10-CM | POA: Diagnosis present

## 2024-02-08 DIAGNOSIS — C775 Secondary and unspecified malignant neoplasm of intrapelvic lymph nodes: Secondary | ICD-10-CM | POA: Diagnosis present

## 2024-02-08 DIAGNOSIS — N401 Enlarged prostate with lower urinary tract symptoms: Secondary | ICD-10-CM

## 2024-02-08 DIAGNOSIS — C7951 Secondary malignant neoplasm of bone: Secondary | ICD-10-CM | POA: Insufficient documentation

## 2024-02-08 DIAGNOSIS — M6281 Muscle weakness (generalized): Secondary | ICD-10-CM | POA: Diagnosis present

## 2024-02-08 DIAGNOSIS — C61 Malignant neoplasm of prostate: Secondary | ICD-10-CM | POA: Diagnosis present

## 2024-02-08 LAB — CBC WITH DIFFERENTIAL (CANCER CENTER ONLY)
Abs Immature Granulocytes: 0.03 K/uL (ref 0.00–0.07)
Basophils Absolute: 0 K/uL (ref 0.0–0.1)
Basophils Relative: 0 %
Eosinophils Absolute: 0 K/uL (ref 0.0–0.5)
Eosinophils Relative: 0 %
HCT: 36.9 % — ABNORMAL LOW (ref 39.0–52.0)
Hemoglobin: 12.3 g/dL — ABNORMAL LOW (ref 13.0–17.0)
Immature Granulocytes: 1 %
Lymphocytes Relative: 11 %
Lymphs Abs: 0.5 K/uL — ABNORMAL LOW (ref 0.7–4.0)
MCH: 30.8 pg (ref 26.0–34.0)
MCHC: 33.3 g/dL (ref 30.0–36.0)
MCV: 92.3 fL (ref 80.0–100.0)
Monocytes Absolute: 2 K/uL — ABNORMAL HIGH (ref 0.1–1.0)
Monocytes Relative: 45 %
Neutro Abs: 1.9 K/uL (ref 1.7–7.7)
Neutrophils Relative %: 43 %
Platelet Count: 98 K/uL — ABNORMAL LOW (ref 150–400)
RBC: 4 MIL/uL — ABNORMAL LOW (ref 4.22–5.81)
RDW: 15.3 % (ref 11.5–15.5)
WBC Count: 4.4 K/uL (ref 4.0–10.5)
nRBC: 0 % (ref 0.0–0.2)

## 2024-02-08 LAB — CMP (CANCER CENTER ONLY)
ALT: 22 U/L (ref 0–44)
AST: 25 U/L (ref 15–41)
Albumin: 4.5 g/dL (ref 3.5–5.0)
Alkaline Phosphatase: 113 U/L (ref 38–126)
Anion gap: 11 (ref 5–15)
BUN: 17 mg/dL (ref 8–23)
CO2: 25 mmol/L (ref 22–32)
Calcium: 9.8 mg/dL (ref 8.9–10.3)
Chloride: 105 mmol/L (ref 98–111)
Creatinine: 0.78 mg/dL (ref 0.61–1.24)
GFR, Estimated: >60 mL/min
Glucose, Bld: 105 mg/dL — ABNORMAL HIGH (ref 70–99)
Potassium: 4.7 mmol/L (ref 3.5–5.1)
Sodium: 141 mmol/L (ref 135–145)
Total Bilirubin: 0.6 mg/dL (ref 0.0–1.2)
Total Protein: 6.8 g/dL (ref 6.5–8.1)

## 2024-02-08 LAB — FOLATE: Folate: >20 ng/mL

## 2024-02-08 LAB — VITAMIN B12: Vitamin B-12: 869 pg/mL (ref 180–914)

## 2024-02-08 LAB — PSA: Prostatic Specific Antigen: 0.07 ng/mL (ref 0.00–4.00)

## 2024-02-08 LAB — IMMATURE PLATELET FRACTION: Immature Platelet Fraction: 8.9 % — ABNORMAL HIGH (ref 1.2–8.6)

## 2024-02-08 NOTE — Therapy (Signed)
 " OUTPATIENT PHYSICAL THERAPY TREATMENT And Progress Report (12/05/2023 - 02/08/2024)   Patient Name: Leonard Stokes MRN: [id number] DOB:1945-12-04, 79 y.o., male Today's Date: 02/08/2024  END OF SESSION:  PT End of Session - 02/08/24 1524     Visit Number 20    Number of Visits 33    Date for Recertification  02/24/24    PT Start Time 1524    PT Stop Time 1604    PT Time Calculation (min) 40 min    Activity Tolerance Patient tolerated treatment well    Behavior During Therapy [facility] for tasks assessed/performed                             Past Medical History:  Diagnosis Date   Anemia    Arthritis    Atherosclerosis of aorta 2018   BPH (benign prostatic hyperplasia)    Cancer (HCC) 2019   prostate   Chronic kidney disease 08/2016   kidney stone   Diverticulosis of sigmoid colon    History of kidney stones    Hypertension    Leg cramps    Past Surgical History:  Procedure Laterality Date   APPENDECTOMY     EXTRACORPOREAL SHOCK WAVE LITHOTRIPSY Right 08/19/2016   Procedure: EXTRACORPOREAL SHOCK WAVE LITHOTRIPSY (ESWL);  Surgeon: Penne Knee, MD;  Location: [facility] ORS;  Service: Urology;  Laterality: Right;   EXTRACORPOREAL SHOCK WAVE LITHOTRIPSY Left 03/24/2017   Procedure: EXTRACORPOREAL SHOCK WAVE LITHOTRIPSY (ESWL);  Surgeon: Penne Knee, MD;  Location: [facility] ORS;  Service: Urology;  Laterality: Left;   EYE MUSCLE SURGERY     INGUINAL HERNIA REPAIR Right 12/23/2017   Direct hernia, medium Ultra Pro mesh;  Surgeon: Dessa Reyes ORN, MD;  Location: [facility] ORS;  Service: General;  Laterality: Right;   JOINT REPLACEMENT     KNEE ARTHROPLASTY Left 11/15/2016   Procedure: COMPUTER ASSISTED TOTAL KNEE ARTHROPLASTY;  Surgeon: Mardee Lynwood SQUIBB, MD;  Location: [facility] ORS;  Service: Orthopedics;  Laterality: Left;   TOTAL KNEE ARTHROPLASTY Right    Patient Active Problem List   Diagnosis Date Noted   Thrombocytopenia 12/01/2023   Mood swing 10/05/2023    Gait instability 10/05/2023   Encounter for monitoring androgen deprivation therapy 09/15/2023   Neoplasm related pain 09/15/2023   Prostate cancer metastatic to multiple sites [facility]) 12/01/2017   Right inguinal hernia 04/15/2017   Hypertension 11/30/2016   S/P total knee arthroplasty 11/15/2016   Personal history of tobacco use, presenting hazards to health 09/26/2014    PCP: Diedra Lame, MD   REFERRING PROVIDER: Babara Call, MD   REFERRING DIAG: C61 (ICD-10-CM) - Prostate cancer metastatic to multiple sites (HCC) R53.1 (ICD-10-CM) - Weakness  Rationale for Evaluation and Treatment: Rehabilitation  THERAPY DIAG:  Unsteadiness on feet  Muscle weakness (generalized)  History of falling  ONSET DATE: 10/05/2023 (Date PT referral signed)  SUBJECTIVE:  SUBJECTIVE STATEMENT: Went to the [facility] yesterday and was told that the AFO can help him with balance. Fell down 2 weeks ago in the yard while walking down hill direction. Can't get up from the ground by himself.    Planning on going to [place] 11/2024   PERTINENT HISTORY:  Weakness, decreased balance. Uneven surfaces causes him to lose balance. Also is unsteady when he first wakes up in the morning. Difficulty with balance started about 6-8 weeks ago which is worsening. Pt was cleaning up after his dog and lost balance going forward and could not get up. His PT neighbor helped him up. Had difficulty using his L LE to get himself up.    Blood pressure is controlled per pt.  No latex allergies.    No pain currently. Pain is primarily at night, around his abdominal area.   Standing up from a seated position such as a toilet, is very difficult, needs a grab bar assist.      PAIN:  Are you having pain? Yes: NPRS scale: none at the moment.   Pain location: abdominal area and some in the back, might be cancer related.  Pain description: achy Aggravating factors: unknown, does not have it all the time.  Relieving factors: oxycodone   PRECAUTIONS: Fall and Other: CA  RED FLAGS: Bowel or bladder incontinence: No and Cauda equina syndrome: No   WEIGHT BEARING RESTRICTIONS: No  FALLS:  Has patient fallen in last 6 months? Yes. Number of falls 6, pt states catching himself most of the time. Did not really fall, he just loss balance. Uneven surfaces causes him to lose balance  LIVING ENVIRONMENT: Lives with: lives with their spouse Lives in: House/apartment Stairs: Yes: Internal: 14 steps; on right going up and External: 2 steps; on right going up Has following equipment at home: Single point cane, Walker - 4 wheeled, and Grab bars  OCCUPATION: retired  PLOF: Independent  PATIENT GOALS: getting up from the chair and toilet, and improve B LE strength L > R  NEXT MD VISIT: about 1-2 weeks from now.  OBJECTIVE:  Note: Objective measures were completed at Evaluation unless otherwise noted.  DIAGNOSTIC FINDINGS:    PATIENT SURVEYS:  ABC scale: The Activities-Specific Balance Confidence (ABC) Scale 0% 10 20 30  40 50 60 70 80 90 100% No confidence<->completely confident  How confident are you that you will not lose your balance or become unsteady when you . . .   Date tested 10/20/2023  Walk around the house 50%  2. Walk up or down stairs 50%  3. Bend over and pick up a slipper from in front of a closet floor 0%  4. Reach for a small can off a shelf at eye level 100%  5. Stand on tip toes and reach for something above your head 50%  6. Stand on a chair and reach for something 30%  7. Sweep the floor 60%  8. Walk outside the house to a car parked in the driveway 100%  9. Get into or out of a car 100%  10. Walk across a parking lot to the mall 80%  11. Walk up or down a ramp 80%  12. Walk in a crowded mall where  people rapidly walk past you 100%  13. Are bumped into by people as you walk through the mall 80%  14. Step onto or off of an escalator while you are holding onto the railing 100%  15. Step onto or off an  escalator while holding onto parcels such that you cannot hold onto the railing 40%  16. Walk outside on icy sidewalks 0%  Total: #/16 1020%/16 = 63.75%     COGNITION: Overall cognitive status: Within functional limits for tasks assessed     SENSATION:   MUSCLE LENGTH:   POSTURE: wide base of support, hip and knee flexion, forward flexed posture, R hip ER,   PALPATION:   LUMBAR ROM:   AROM eval  Flexion in sitting  full  Extension WFL (LOB backwards in standing)  Right lateral flexion  WFL  Left lateral flexion WFL  Right rotation in sitting WFL  Left rotation in sitting WFL   (Blank rows = not tested)  LOWER EXTREMITY ROM:     Passive  Right 11/30/2023 Left 11/30/2023  Hip flexion    Hip extension    Hip abduction    Hip adduction    Hip internal rotation    Hip external rotation    Knee flexion    Knee extension    Ankle dorsiflexion -38 (-10 AAROM) -20 (-4 AAROM)  Ankle plantarflexion    Ankle inversion    Ankle eversion     (Blank rows = not tested)  LOWER EXTREMITY MMT:    MMT Right eval Left eval  Hip flexion 4 3-  Hip extension (seated manually resisted) 4- 4-  Hip abduction (seated manually resisted) 4 4  Hip adduction    Hip internal rotation    Hip external rotation    Knee flexion 4+ 4  Knee extension 5 4+  Ankle dorsiflexion    Ankle plantarflexion    Ankle inversion    Ankle eversion     (Blank rows = not tested)  LUMBAR SPECIAL TESTS:  (-) heel to shin test (+) finger to nose on L side.   Difficulty with rapid alternating palms up and down.   FUNCTIONAL TESTS:  Lars Balance Scale:  Item Test date: 10/20/2023 Date:  Date:   Sitting to standing 3. able to stand independently using hands Insert SmartPhrase OPRCBERGREEVAL  Insert SmartPhrase OPRCBERGREEVAL  2. Standing unsupported 2. able to stand 30 seconds unsupported    3. Sitting with back unsupported, feet supported 4. able to sit safely and securely for 2 minutes    4. Standing to sitting 3. controls descent by using hands    5. Pivot transfer  3. able to transfer safely with definite need of hands    6. Standing unsupported with eyes closed 3. able to stand 10 seconds with supervision    7. Standing unsupported with feet together 3. able to place feet together independently and stand 1 minute with supervision    8. Reaching forward with outstretched arms while standing 2. can reach forward 5 cm (2 inches)    9. Pick up object from the floor from standing 3. able to pick up slipper but needs supervision    10. Turning to look behind over left and right shoulders while standing 1. needs supervision when turning    11. Turn 360 degrees 1. needs close supervision or verbal cuing    12. Place alternate foot on step or stool while standing unsupported 0. needs assistance to keep from falling/unable to try    13. Standing unsupported one foot in front 1. needs help to step but can hold 15 seconds    14. Standing on one leg 0. unable to try of needs assist to prevent fall      Total Score  29/56 Total Score:    Total Score:       Score Interpretation: Score of <19 indicates high risk of falls.  Minimally Clinically Important Difference (MCID):  =DGI scores of<21/24 = 1.80 points DGI scores of >21/24 = 0.60 points   Tyrone T, Inbar-Borovsky N, Brozgol M, Giladi N, [place] JM. The Dynamic Gait Index in healthy older adults: the role of stair climbing, fear of falling and gender. Gait Posture. 2009 Feb;29(2):237-41. doi: 10.1016/j.gaitpost.2008.08.013. Epub 2008 Oct 8. PMID: 81154560; PMCID: EFR7290501.  Pardasaney, MYRTIS LOIS Bonus, GEANNIE POUR., et al. (2012). Sensitivity to change and responsiveness of four balance measures for community-dwelling older adults.  Physical therapy 92(3): 388-397.   Item Test date: 11/28/2023 Date:  Date:   Sitting to standing 3. able to stand independently using hands Insert SmartPhrase OPRCBERGREEVAL Insert SmartPhrase OPRCBERGREEVAL  2. Standing unsupported 3. able to stand 2 minutes with supervision    3. Sitting with back unsupported, feet supported 4. able to sit safely and securely for 2 minutes    4. Standing to sitting 4. sits safely with minimal use of hands    5. Pivot transfer  3. able to transfer safely with definite need of hands    6. Standing unsupported with eyes closed 3. able to stand 10 seconds with supervision    7. Standing unsupported with feet together 0. needs help to attain position and unable to hold for 15 seconds    8. Reaching forward with outstretched arms while standing 0. loses balance while trying/requires external support    9. Pick up object from the floor from standing 3. able to pick up slipper but needs supervision    10. Turning to look behind over left and right shoulders while standing 3. looks behind one side only, other side shows less weight shift    11. Turn 360 degrees 0. needs assistance while turning    12. Place alternate foot on step or stool while standing unsupported 0. needs assistance to keep from falling/unable to try    13. Standing unsupported one foot in front 0. loses balance while stepping or standing    14. Standing on one leg 0. unable to try of needs assist to prevent fall      Total Score 26/56 Total Score:    Total Score:    Item Test date: 12/28/2023 Date:  Date:   Sitting to standing 3. able to stand independently using hands Insert SmartPhrase OPRCBERGREEVAL Insert SmartPhrase OPRCBERGREEVAL  2. Standing unsupported 4. able to stand safely for 2 minutes    3. Sitting with back unsupported, feet supported 4. able to sit safely and securely for 2 minutes    4. Standing to sitting 3. controls descent by using hands    5. Pivot transfer  3. able to  transfer safely with definite need of hands    6. Standing unsupported with eyes closed 4. able to stand 10 seconds safely    7. Standing unsupported with feet together 0. needs help to attain position and unable to hold for 15 seconds    8. Reaching forward with outstretched arms while standing 2. can reach forward 5 cm (2 inches)    9. Pick up object from the floor from standing 3. able to pick up slipper but needs supervision    10. Turning to look behind over left and right shoulders while standing 3. looks behind one side only, other side shows less weight shift    11. Turn 360 degrees  0. needs assistance while turning    12. Place alternate foot on step or stool while standing unsupported 0. needs assistance to keep from falling/unable to try    13. Standing unsupported one foot in front 2. able to take small step independently and hold 30 seconds    14. Standing on one leg 0. unable to try of needs assist to prevent fall      Total Score 31/56 Total Score:    Total Score:    Item Test date: 02/08/2024 Date:  Date:   Sitting to standing 3. able to stand independently using hands Insert SmartPhrase OPRCBERGREEVAL Insert SmartPhrase OPRCBERGREEVAL  2. Standing unsupported 4. able to stand safely for 2 minutes    3. Sitting with back unsupported, feet supported 4. able to sit safely and securely for 2 minutes    4. Standing to sitting 3. controls descent by using hands    5. Pivot transfer  3. able to transfer safely with definite need of hands    6. Standing unsupported with eyes closed 3. able to stand 10 seconds with supervision    7. Standing unsupported with feet together 3. able to place feet together independently and stand 1 minute with supervision    8. Reaching forward with outstretched arms while standing 2. can reach forward 5 cm (2 inches)    9. Pick up object from the floor from standing 0. unable to try/needs assistance to keep from losing balance or falling    10. Turning to  look behind over left and right shoulders while standing 0. needs assistance to keep from losing balance or falling    11. Turn 360 degrees 0. needs assistance while turning    12. Place alternate foot on step or stool while standing unsupported 0. needs assistance to keep from falling/unable to try    13. Standing unsupported one foot in front 0. loses balance while stepping or standing    14. Standing on one leg 0. unable to try of needs assist to prevent fall      Total Score 25/56 Total Score:    Total Score:      GAIT: Distance walked: 30 ft Assistive device utilized: Single point cane Level of assistance: Modified independence and SBA Comments: SPC on R, decreased stance R LE, forward flexed, decreased gait velocity.   TREATMENT DATE: 02/08/2023                                                                                                                                 Therapeutic activities Performed with the intent of improving ability to perform standing tasks with less difficulty and less fall risk.   Seated manually resisted hip flexion, hip extension, hip abduction, knee flexion, knee extension  Reviewed progress with strength with pt.   Improved technique, movement at target joints, use of target muscles after mod verbal, visual, tactile cues.    Neuromuscular re education  Directed patient with sit <> stand throughout session Stand pivot transfer chair <> low mat table Static standing shoulder width apart, then with eyes closed, then with eyes open feet together, tandem stance with feet shoulder width apart Picking up an object (computer mouse) from the floor,   Turning 360 degrees to the R and to the L 1x Looking behind to the R and to the L,   Standing forward reach,    Standing alternate toe taps 4 x each LE onto first regular step  Pt demonstrates tendency for loss balance backwards and difficulty with catching himself/righting reaction) when losing balance.    Reviewed progress/current status with PT towards goals.   Standing balance reaction   Backwards 3x. Unable to regain balance independently    Pt observed not to take long enough steps backwards.    Cues to take larger steps back.   Standing backwards walk with on UE assist 5 ft. 10x  Emphasis on large steps.     Improved technique, movement at target joints, use of target muscles after mod verbal, visual, tactile cues.       PATIENT EDUCATION:  Education details: POC Person educated: Patient Education method: Explanation Education comprehension: verbalized understanding  HOME EXERCISE PROGRAM: Access Code: [email or web address] URL: [email or web address] Date: 11/08/2023 Prepared by: Emil Glassman  Exercises - Seated Hip Flexion  - 1 x daily - 7 x weekly - 3 sets - 5 reps - Narrow Stance with Counter Support  - 3 x daily - 7 x weekly - 1 sets - 5 reps - 30 seconds hold - Standing Gastroc Stretch at Counter  - 3 x daily - 7 x weekly - 1 sets - 3 reps - 1 minute hold  - Standing Single Leg Stance with Counter Support  - 1 x daily - 7 x weekly - 3 sets - 10 reps - 5 seconds hold  - Seated Ankle Plantar Flexion with Resistance Loop  - 1 x daily - 7 x weekly - 3 sets - 10 reps  Blue band.  - Lunge with Counter Support  - 1 x daily - 7 x weekly - 3 sets - 10 reps  - Seated Piriformis Stretch with Trunk Bend  - 2-3 x daily - 7 x weekly - 1 sets - 5 reps - 30 seconds to 1 minute hold - Seated Hip Internal Rotation AROM  - 1 x daily - 7 x weekly - 3 sets - 10 reps  - Sidelying Hip Abduction  - 1 x daily - 7 x weekly - 2-3 sets - 5 reps    ASSESSMENT:  CLINICAL IMPRESSION:  Pt demonstrates improved LE strength compared to previous measurements but decreased balance based on today's Berg Balance test. Pt demonstrates tendency for loss of balance backwards in which pt is unable to correct (unable to perform backwards stepping compensatory correction). Worked on backwards  stepping today to help address and decrease fall risk. Pt will benefit from continued skilled physical therapy services to improve strength, balance and function.     OBJECTIVE IMPAIRMENTS: Abnormal gait, decreased balance, difficulty walking, decreased strength, improper body mechanics, and postural dysfunction.   ACTIVITY LIMITATIONS: carrying, lifting, bending, standing, squatting, stairs, transfers, toileting, dressing, and locomotion level  PARTICIPATION LIMITATIONS:   PERSONAL FACTORS: Age, Fitness, Past/current experiences, Time since onset of injury/illness/exacerbation, and 3+ comorbidities: Cancer, HTN, arthritis are also affecting patient's functional outcome.   REHAB POTENTIAL: Fair    CLINICAL DECISION MAKING: Evolving/moderate complexity; balance is worsening per  pt.   EVALUATION COMPLEXITY: Moderate   GOALS: Goals reviewed with patient? Yes  SHORT TERM GOALS: Target date: 10/28/2023  Pt will be independent with his initial HEP to improve LE strength, balance, and function.  Baseline: Pt has not yet started his initial HEP (10/20/2023); no questions (11/30/2023) Goal status: MET   LONG TERM GOALS: Target date: 02/24/2024  Pt will improve his Berg Balance Test score to at least 48/56 as a demonstration of improved balance and decreased need for AD.  Baseline: 29/56 (10/20/2023); 26/56 (11/28/2023); 31/56 (12/28/2023); 25/56 (02/08/2024) Goal status: ONGOING  2.  Pt will improve his B LE strength by at least 1/2 MMT grade to promote ability to perform transfers and standing tasks with less difficulty and with improved balance.  Baseline:  MMT Right eval Left eval R 11/30/2023 L 11/30/2023 R 12/28/2023 L 12/28/2023  Hip flexion 4 3- 4 4- 4 4  Hip extension (seated manually resisted) 4- 4- 4+ 4+ 5 4+  Hip abduction (seated manually resisted) 4 4 4+ 4+ 4+ 5  Knee flexion 4+ 4 5 4+ 5 4  Knee extension 5 4+ 5 5 5 5    MMT Right eval Left eval R 02/08/2024  L 02/08/2024 R  L   Hip flexion 4 3- 4+ 4+    Hip extension (seated manually resisted) 4- 4- 5 4+    Hip abduction (seated manually resisted) 4 4 5 5     Knee flexion 4+ 4 5 4+    Knee extension 5 4+ 5 5      Goal status: MET   3.  Pt will improve his Activities Specific Balance Confidence (ABC) scale by at least 15% as a demonstration of improved balance.  Baseline:  63.75% (9/18/025); 76% (11/30/2023) Goal status: PARTIALLY MET   PLAN:  PT FREQUENCY: 1-2x/week  PT DURATION: 8 weeks  PLANNED INTERVENTIONS: 97110-Therapeutic exercises, 97530- Therapeutic activity, [id number]- Neuromuscular re-education, 97535- Self Care, [id number]- Manual therapy, [id number]- Gait training, Patient/Family education, Balance training, and Stair training.  PLAN FOR NEXT SESSION: posture, trunk, hip and knee strengthening, balance training, manual techniques, modalities PRN.    Yahia Bottger, PT, DPT 02/08/2024, 4:25 PM  "

## 2024-02-09 ENCOUNTER — Inpatient Hospital Stay: Admitting: Hospice and Palliative Medicine

## 2024-02-09 DIAGNOSIS — C61 Malignant neoplasm of prostate: Secondary | ICD-10-CM | POA: Diagnosis not present

## 2024-02-09 DIAGNOSIS — G893 Neoplasm related pain (acute) (chronic): Secondary | ICD-10-CM

## 2024-02-09 DIAGNOSIS — Z515 Encounter for palliative care: Secondary | ICD-10-CM

## 2024-02-09 MED ORDER — HYDROCODONE-ACETAMINOPHEN 5-325 MG PO TABS: 1.0000 | ORAL_TABLET | Freq: Four times a day (QID) | ORAL | 0 refills | Status: DC | PRN

## 2024-02-09 NOTE — Progress Notes (Signed)
 Virtual Visit via Telephone Note  I connected with [name] on 02/09/2024 at  2:00 PM EST by telephone and verified that I am speaking with the correct person using two identifiers.  Location: Patient: Home Provider: Clinic   I discussed the limitations, risks, security and privacy concerns of performing an evaluation and management service by telephone and the availability of in person appointments. I also discussed with the patient that there may be a patient responsible charge related to this service. The patient expressed understanding and agreed to proceed.   History of Present Illness: Leonard Stokes is a 79 y.o. 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  Patient referred to palliative care to address goals of manage ongoing symptoms.    Observations/Objective: I spoke with patient and wife by phone.  Patient reports he is doing well.  He continues to work with PT and describes having some balance issues and a few remote falls.  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  PSA decreasing.  Patient appears to be tolerating treatments well without adverse effects.  Patient does endorse some intermittent pain in the arms at sites of known hypermetabolic lesions.  He says tramadol  does not help and he is too loopy on oxycodone .  Will rotate to Norco.   Assessment and Plan: Metastatic prostate cancer -on Firmagon  and Nubeqa .  PSA downtrending.  Patient doing well symptomatically.  Neoplasm related pain - rotate to Norco.   Follow Up Instructions: Follow-up telephone visit 2 to 3 months   I discussed the assessment and treatment plan with the patient. The patient was provided an opportunity to ask questions and all were answered. The patient agreed with the plan and demonstrated an understanding of the instructions.   The patient was advised to call back or seek an in-person  evaluation if the symptoms worsen or if the condition fails to improve as anticipated.  I provided 15 minutes of non-face-to-face time during this encounter.   FONDA JONELLE MOWER, NP

## 2024-02-10 ENCOUNTER — Ambulatory Visit

## 2024-02-10 ENCOUNTER — Other Ambulatory Visit: Payer: Self-pay

## 2024-02-13 ENCOUNTER — Other Ambulatory Visit (HOSPITAL_COMMUNITY): Payer: Self-pay

## 2024-02-13 ENCOUNTER — Other Ambulatory Visit: Payer: Self-pay

## 2024-02-13 ENCOUNTER — Ambulatory Visit

## 2024-02-13 NOTE — Progress Notes (Signed)
 Specialty Pharmacy Refill Coordination Note  MyChart Questionnaire Submission  Leonard Stokes is a 79 y.o. male contacted today regarding refills of specialty medication(s) Nubeqa .  Doses on hand: 20  Patient requested: Delivery  Delivery date: 02/15/24  Verified address: [address] [place] [address]  Medication will be filled on 02/14/24

## 2024-02-14 ENCOUNTER — Telehealth: Payer: Self-pay | Admitting: Oncology

## 2024-02-14 ENCOUNTER — Other Ambulatory Visit: Payer: Self-pay

## 2024-02-14 ENCOUNTER — Ambulatory Visit: Admitting: Physician Assistant

## 2024-02-14 ENCOUNTER — Encounter: Payer: Self-pay | Admitting: Physician Assistant

## 2024-02-14 VITALS — BP 127/79 | HR 77 | Ht 71.0 in | Wt 176.6 lb

## 2024-02-14 DIAGNOSIS — R350 Frequency of micturition: Secondary | ICD-10-CM | POA: Diagnosis not present

## 2024-02-14 DIAGNOSIS — N401 Enlarged prostate with lower urinary tract symptoms: Secondary | ICD-10-CM

## 2024-02-14 LAB — BLADDER SCAN AMB NON-IMAGING

## 2024-02-14 MED ORDER — MIRABEGRON ER 50 MG PO TB24: 50.0000 mg | ORAL_TABLET | Freq: Every day | ORAL | 11 refills | Status: AC

## 2024-02-14 NOTE — Progress Notes (Signed)
 "  02/14/2024 1:41 PM   Leonard Stokes 1945-04-05 [id number]  CC: Chief Complaint  Patient presents with   Benign prostatic hyperplasia with urinary frequency   HPI: Leonard Stokes is a 79 y.o. male with metastatic prostate cancer on Nubeqa  and ADT per Dr. Babara, ED s/p IPP placement and revision, and BPH with urinary frequency/nocturia on Flomax  and finasteride  who failed Gemtesa  who presents today for symptom recheck on trospium  ER 60 mg.   Today he reports perhaps some modest improvement on trospium , though he continues to have nocturia x 3-4 and daytime urgency.  PSA continues to downtrend, most recently 0.07 last week.  PVR 345 mL, previously 366 mL (random bladder scan, not PVR).  PMH: Past Medical History:  Diagnosis Date   Anemia    Arthritis    Atherosclerosis of aorta 2018   BPH (benign prostatic hyperplasia)    Cancer (HCC) 2019   prostate   Chronic kidney disease 08/2016   kidney stone   Diverticulosis of sigmoid colon    History of kidney stones    Hypertension    Leg cramps     Surgical History: Past Surgical History:  Procedure Laterality Date   APPENDECTOMY     EXTRACORPOREAL SHOCK WAVE LITHOTRIPSY Right 08/19/2016   Procedure: EXTRACORPOREAL SHOCK WAVE LITHOTRIPSY (ESWL);  Surgeon: Penne Knee, MD;  Location: [facility];  Service: Urology;  Laterality: Right;   EXTRACORPOREAL SHOCK WAVE LITHOTRIPSY Left 03/24/2017   Procedure: EXTRACORPOREAL SHOCK WAVE LITHOTRIPSY (ESWL);  Surgeon: Penne Knee, MD;  Location: [facility];  Service: Urology;  Laterality: Left;   EYE MUSCLE SURGERY     INGUINAL HERNIA REPAIR Right 12/23/2017   Direct hernia, medium Ultra Pro mesh;  Surgeon: Dessa Reyes ORN, MD;  Location: [facility];  Service: General;  Laterality: Right;   JOINT REPLACEMENT     KNEE ARTHROPLASTY Left 11/15/2016   Procedure: COMPUTER ASSISTED TOTAL KNEE ARTHROPLASTY;  Surgeon: Mardee Lynwood SQUIBB, MD;  Location: [facility];  Service: Orthopedics;  Laterality:  Left;   TOTAL KNEE ARTHROPLASTY Right     Home Medications:  Allergies as of 02/14/2024       Reactions   Codeine Rash        Medication List        Accurate as of February 14, 2024  1:41 PM. If you have any questions, ask your nurse or doctor.          STOP taking these medications    methocarbamol 500 MG tablet Commonly known as: ROBAXIN Stopped by: Rosellen Lichtenberger       TAKE these medications    acetaminophen  500 MG tablet Commonly known as: TYLENOL  Take 1,000 mg by mouth every 6 (six) hours as needed for moderate pain.   atorvastatin 20 MG tablet Commonly known as: LIPITOR Take 20 mg by mouth daily.   B COMPLEX-B12 PO Take 1 tablet by mouth daily.   CALCIUM ACETATE PO Take 2 tablets by mouth daily.   cyanocobalamin 1000 MCG tablet Take by mouth.   cyclobenzaprine  10 MG tablet Commonly known as: FLEXERIL  Take 1 tablet (10 mg total) by mouth 3 (three) times daily as needed for muscle spasms.   docusate sodium  100 MG capsule Commonly known as: Colace Take 1 capsule (100 mg total) by mouth 2 (two) times daily.   famotidine  20 MG tablet Commonly known as: PEPCID  Take 20 mg by mouth at bedtime.   ferrous sulfate  325 (65 FE) MG tablet Take 325 mg by mouth  daily with breakfast.   finasteride  5 MG tablet Commonly known as: PROSCAR  TAKE ONE TABLET (5 MG) BY MOUTH EVERY OTHER DAY   hydrochlorothiazide  12.5 MG tablet Commonly known as: HYDRODIURIL    HYDROcodone -acetaminophen  5-325 MG tablet Commonly known as: NORCO/VICODIN Take 1 tablet by mouth every 6 (six) hours as needed for moderate pain (pain score 4-6).   ipratropium 0.03 % nasal spray Commonly known as: ATROVENT SMARTSIG:1-2 Spray(s) Both Nares 3 Times Daily PRN   losartan -hydrochlorothiazide  50-12.5 MG tablet Commonly known as: HYZAAR Take 1 tablet by mouth every morning.   meloxicam 15 MG tablet Commonly known as: MOBIC Take 1 tablet by mouth daily.   NONFORMULARY OR  COMPOUNDED ITEM Trimix (30/1/10)-(Pap/Phent/PGE)  Test Dose  1ml vial   Qty #3 Refills 0  [facility] [phone] Fax [phone]   Nubeqa  300 MG tablet Generic drug: darolutamide  Take 2 tablets (600 mg total) by mouth 2 (two) times daily with a meal.   OSTEO BI-FLEX TRIPLE STRENGTH PO Take 1 tablet by mouth daily.   polyethylene glycol 17 g packet Commonly known as: MiraLax  Take 17 g by mouth daily.   SENIOR MULTIVITAMIN PLUS PO Take 1 tablet by mouth daily.   tamsulosin  0.4 MG Caps capsule Commonly known as: FLOMAX  TAKE ONE CAPSULE DAILY 30 MINUTES AFTER THE SAME MEAL EACH DAY   Trospium  Chloride 60 MG Cp24 Take 1 capsule (60 mg total) by mouth daily.   vitamin C  1000 MG tablet Take 1,000 mg by mouth daily.   Vitamin D-3 125 MCG (5000 UT) Tabs Take 5,000 Units by mouth daily.   vitamin E 180 MG (400 UNITS) capsule Take by mouth.   zolpidem  10 MG tablet Commonly known as: AMBIEN  Take 10 mg by mouth at bedtime as needed.        Allergies:  Allergies[1]  Family History: History reviewed. No pertinent family history.  Social History:   reports that he quit smoking about 27 years ago. His smoking use included cigarettes. He started smoking about 47 years ago. He has a 60 pack-year smoking history. He has never used smokeless tobacco. He reports current alcohol use. He reports that he does not use drugs.  Physical Exam: BP (!) 161/72   Pulse 80   Ht 5' 11 (1.803 m)   Wt 176 lb 9.6 oz (80.1 kg)   BMI 24.63 kg/m   Constitutional:  Alert and oriented, no acute distress, nontoxic appearing HEENT: [place], AT Cardiovascular: No clubbing, cyanosis, or edema Respiratory: Normal respiratory effort, no increased work of breathing Skin: No rashes, bruises or suspicious lesions Neurologic: Grossly intact, no focal deficits, moving all 4 extremities Psychiatric: Normal mood and affect  Laboratory Data: Results for orders placed or performed in visit on  02/14/24  Bladder Scan (Post Void Residual) in office   Collection Time: 02/14/24  1:38 PM  Result Value Ref Range   Scan Result    Assessment & Plan:   1. Benign prostatic hyperplasia with urinary frequency (Primary) Slight improvement on trospium  ER 60 mg, however I suspect his residual has risen somewhat.  Will try Myrbetriq  50 mg as an alternative.  May consider PTNS if he fails this. - Bladder Scan (Post Void Residual) in office - mirabegron  ER (MYRBETRIQ ) 50 MG TB24 tablet; Take 1 tablet (50 mg total) by mouth daily.  Dispense: 30 tablet; Refill: 11   Return in about 6 weeks (around 03/27/2024) for Symptom recheck with PVR.  Lucie Hones, PA-C  [facility]  [address] [place], [place] [address] [phone]     [1]  Allergies Allergen Reactions   Codeine Rash   "

## 2024-02-14 NOTE — Telephone Encounter (Signed)
 Called pt to see if he wanted an earlier appt but he has PT tomorrow and will keep his original appt on Thursday.  Pt stated he does have a question for Dr.Yu. Pt wants to know what medication should he be taken each day, he says he has seen many doctors for many things and wants to know exactly what he should be taking each day.   Pt would like to discuss this with MD during his visit on Thursday.

## 2024-02-15 ENCOUNTER — Ambulatory Visit

## 2024-02-15 ENCOUNTER — Telehealth: Payer: Self-pay

## 2024-02-15 DIAGNOSIS — M6281 Muscle weakness (generalized): Secondary | ICD-10-CM

## 2024-02-15 DIAGNOSIS — R2681 Unsteadiness on feet: Secondary | ICD-10-CM

## 2024-02-15 DIAGNOSIS — Z9181 History of falling: Secondary | ICD-10-CM

## 2024-02-15 NOTE — Therapy (Addendum)
 " OUTPATIENT PHYSICAL THERAPY TREATMENT   Patient Name: Leonard Stokes MRN: [id number] DOB:[birth year], 79 y.o., male Today's Date: 02/15/2024  END OF SESSION:  PT End of Session - 02/15/24 0900     Visit Number 21    Number of Visits 33    Date for Recertification  02/24/24    PT Start Time 0901    PT Stop Time 0942    PT Time Calculation (min) 41 min    Activity Tolerance Patient tolerated treatment well    Behavior During Therapy [facility] for tasks assessed/performed                              Past Medical History:  Diagnosis Date   Anemia    Arthritis    Atherosclerosis of aorta 2018   BPH (benign prostatic hyperplasia)    Cancer (HCC) 2019   prostate   Chronic kidney disease 08/2016   kidney stone   Diverticulosis of sigmoid colon    History of kidney stones    Hypertension    Leg cramps    Past Surgical History:  Procedure Laterality Date   APPENDECTOMY     EXTRACORPOREAL SHOCK WAVE LITHOTRIPSY Right 08/19/2016   Procedure: EXTRACORPOREAL SHOCK WAVE LITHOTRIPSY (ESWL);  Surgeon: Penne Knee, MD;  Location: [facility] OR[facility];  Service: Urology;  Laterality: Right;   EXTRACORPOREAL SHOCK WAVE LITHOTRIPSY Left 03/24/2017   Procedure: EXTRACORPOREAL SHOCK WAVE LITHOTRIPSY (ESWL);  Surgeon: Penne Knee, MD;  Location: [facility];  Service: Urology;  Laterality: Left;   EYE MUSCLE SURGERY     INGUINAL HERNIA REPAIR Right 12/23/2017   Direct hernia, medium Ultra Pro mesh;  Surgeon: Dessa Reyes ORN, MD;  Location: [facility];  Service: General;  Laterality: Right;   JOINT REPLACEMENT     KNEE ARTHROPLASTY Left 11/15/2016   Procedure: COMPUTER ASSISTED TOTAL KNEE ARTHROPLASTY;  Surgeon: Mardee Lynwood SQUIBB, MD;  Location: [facility];  Service: Orthopedics;  Laterality: Left;   TOTAL KNEE ARTHROPLASTY Right    Patient Active Problem List   Diagnosis Date Noted   Thrombocytopenia 12/01/2023   Mood swing 10/05/2023   Gait instability 10/05/2023   Encounter for  monitoring androgen deprivation therapy 09/15/2023   Neoplasm related pain 09/15/2023   Prostate cancer metastatic to multiple sites [facility]) 12/01/2017   Right inguinal hernia 04/15/2017   Hypertension 11/30/2016   S/P total knee arthroplasty 11/15/2016   Personal history of tobacco use, presenting hazards to health 09/26/2014    PCP: Diedra Lame, MD   REFERRING PROVIDER: Babara Call, MD   REFERRING DIAG: C61 (ICD-10-CM) - Prostate cancer metastatic to multiple sites (HCC) R53.1 (ICD-10-CM) - Weakness  Rationale for Evaluation and Treatment: Rehabilitation  THERAPY DIAG:  Unsteadiness on feet  Muscle weakness (generalized)  History of falling  ONSET DATE: 10/05/2023 (Date PT referral signed)  SUBJECTIVE:  SUBJECTIVE STATEMENT:  Got good report from urologist and his CA is under control. Going to see his oncologist tomorrow, Thursday. Has good PSA.     Planning on going to [place] 11/2024   PERTINENT HISTORY:  Weakness, decreased balance. Uneven surfaces causes him to lose balance. Also is unsteady when he first wakes up in the morning. Difficulty with balance started about 6-8 weeks ago which is worsening. Pt was cleaning up after his dog and lost balance going forward and could not get up. His PT neighbor helped him up. Had difficulty using his L LE to get himself up.    Blood pressure is controlled per pt.  No latex allergies.    No pain currently. Pain is primarily at night, around his abdominal area.   Standing up from a seated position such as a toilet, is very difficult, needs a grab bar assist.      PAIN:  Are you having pain? Yes: NPRS scale: none at the moment.  Pain location: abdominal area and some in the back, might be cancer related.  Pain description: achy Aggravating  factors: unknown, does not have it all the time.  Relieving factors: oxycodone   PRECAUTIONS: Fall and Other: CA  RED FLAGS: Bowel or bladder incontinence: No and Cauda equina syndrome: No   WEIGHT BEARING RESTRICTIONS: No  FALLS:  Has patient fallen in last 6 months? Yes. Number of falls 6, pt states catching himself most of the time. Did not really fall, he just loss balance. Uneven surfaces causes him to lose balance  LIVING ENVIRONMENT: Lives with: lives with their spouse Lives in: House/apartment Stairs: Yes: Internal: 14 steps; on right going up and External: 2 steps; on right going up Has following equipment at home: Single point cane, Walker - 4 wheeled, and Grab bars  OCCUPATION: retired  PLOF: Independent  PATIENT GOALS: getting up from the chair and toilet, and improve B LE strength L > R  NEXT MD VISIT: about 1-2 weeks from now.  OBJECTIVE:  Note: Objective measures were completed at Evaluation unless otherwise noted.  DIAGNOSTIC FINDINGS:    PATIENT SURVEYS:  ABC scale: The Activities-Specific Balance Confidence (ABC) Scale 0% 10 20 30  40 50 60 70 80 90 100% No confidence<->completely confident  How confident are you that you will not lose your balance or become unsteady when you . . .   Date tested 10/20/2023  Walk around the house 50%  2. Walk up or down stairs 50%  3. Bend over and pick up a slipper from in front of a closet floor 0%  4. Reach for a small can off a shelf at eye level 100%  5. Stand on tip toes and reach for something above your head 50%  6. Stand on a chair and reach for something 30%  7. Sweep the floor 60%  8. Walk outside the house to a car parked in the driveway 100%  9. Get into or out of a car 100%  10. Walk across a parking lot to the mall 80%  11. Walk up or down a ramp 80%  12. Walk in a crowded mall where people rapidly walk past you 100%  13. Are bumped into by people as you walk through the mall 80%  14. Step onto or off  of an escalator while you are holding onto the railing 100%  15. Step onto or off an escalator while holding onto parcels such that you cannot hold onto the railing 40%  16. Walk outside on icy sidewalks 0%  Total: #/16 1020%/16 = 63.75%     COGNITION: Overall cognitive status: Within functional limits for tasks assessed     SENSATION:   MUSCLE LENGTH:   POSTURE: wide base of support, hip and knee flexion, forward flexed posture, R hip ER,   PALPATION:   LUMBAR ROM:   AROM eval  Flexion in sitting  full  Extension WFL (LOB backwards in standing)  Right lateral flexion  WFL  Left lateral flexion WFL  Right rotation in sitting WFL  Left rotation in sitting WFL   (Blank rows = not tested)  LOWER EXTREMITY ROM:     Passive  Right 11/30/2023 Left 11/30/2023  Hip flexion    Hip extension    Hip abduction    Hip adduction    Hip internal rotation    Hip external rotation    Knee flexion    Knee extension    Ankle dorsiflexion -38 (-10 AAROM) -20 (-4 AAROM)  Ankle plantarflexion    Ankle inversion    Ankle eversion     (Blank rows = not tested)  LOWER EXTREMITY MMT:    MMT Right eval Left eval  Hip flexion 4 3-  Hip extension (seated manually resisted) 4- 4-  Hip abduction (seated manually resisted) 4 4  Hip adduction    Hip internal rotation    Hip external rotation    Knee flexion 4+ 4  Knee extension 5 4+  Ankle dorsiflexion    Ankle plantarflexion    Ankle inversion    Ankle eversion     (Blank rows = not tested)  LUMBAR SPECIAL TESTS:  (-) heel to shin test (+) finger to nose on L side.   Difficulty with rapid alternating palms up and down.   FUNCTIONAL TESTS:  Lars Balance Scale:  Item Test date: 10/20/2023 Date:  Date:   Sitting to standing 3. able to stand independently using hands Insert SmartPhrase OPRCBERGREEVAL Insert SmartPhrase OPRCBERGREEVAL  2. Standing unsupported 2. able to stand 30 seconds unsupported    3. Sitting with back  unsupported, feet supported 4. able to sit safely and securely for 2 minutes    4. Standing to sitting 3. controls descent by using hands    5. Pivot transfer  3. able to transfer safely with definite need of hands    6. Standing unsupported with eyes closed 3. able to stand 10 seconds with supervision    7. Standing unsupported with feet together 3. able to place feet together independently and stand 1 minute with supervision    8. Reaching forward with outstretched arms while standing 2. can reach forward 5 cm (2 inches)    9. Pick up object from the floor from standing 3. able to pick up slipper but needs supervision    10. Turning to look behind over left and right shoulders while standing 1. needs supervision when turning    11. Turn 360 degrees 1. needs close supervision or verbal cuing    12. Place alternate foot on step or stool while standing unsupported 0. needs assistance to keep from falling/unable to try    13. Standing unsupported one foot in front 1. needs help to step but can hold 15 seconds    14. Standing on one leg 0. unable to try of needs assist to prevent fall      Total Score 29/56 Total Score:    Total Score:       Score  Interpretation: Score of <19 indicates high risk of falls.  Minimally Clinically Important Difference (MCID):  =DGI scores of<21/24 = 1.80 points DGI scores of >21/24 = 0.60 points   Clyde T, Inbar-Borovsky N, Brozgol M, Giladi N, [place] JM. The Dynamic Gait Index in healthy older adults: the role of stair climbing, fear of falling and gender. Gait Posture. 2009 Feb;29(2):237-41. doi: 10.1016/j.gaitpost.2008.08.013. Epub 2008 Oct 8. PMID: 81154560; PMCID: EFR7290501.  Pardasaney, MYRTIS LOIS Bonus, GEANNIE POUR., et al. (2012). Sensitivity to change and responsiveness of four balance measures for community-dwelling older adults. Physical therapy 92(3): 388-397.   Item Test date: 11/28/2023 Date:  Date:   Sitting to standing 3. able to stand  independently using hands Insert SmartPhrase OPRCBERGREEVAL Insert SmartPhrase OPRCBERGREEVAL  2. Standing unsupported 3. able to stand 2 minutes with supervision    3. Sitting with back unsupported, feet supported 4. able to sit safely and securely for 2 minutes    4. Standing to sitting 4. sits safely with minimal use of hands    5. Pivot transfer  3. able to transfer safely with definite need of hands    6. Standing unsupported with eyes closed 3. able to stand 10 seconds with supervision    7. Standing unsupported with feet together 0. needs help to attain position and unable to hold for 15 seconds    8. Reaching forward with outstretched arms while standing 0. loses balance while trying/requires external support    9. Pick up object from the floor from standing 3. able to pick up slipper but needs supervision    10. Turning to look behind over left and right shoulders while standing 3. looks behind one side only, other side shows less weight shift    11. Turn 360 degrees 0. needs assistance while turning    12. Place alternate foot on step or stool while standing unsupported 0. needs assistance to keep from falling/unable to try    13. Standing unsupported one foot in front 0. loses balance while stepping or standing    14. Standing on one leg 0. unable to try of needs assist to prevent fall      Total Score 26/56 Total Score:    Total Score:    Item Test date: 12/28/2023 Date:  Date:   Sitting to standing 3. able to stand independently using hands Insert SmartPhrase OPRCBERGREEVAL Insert SmartPhrase OPRCBERGREEVAL  2. Standing unsupported 4. able to stand safely for 2 minutes    3. Sitting with back unsupported, feet supported 4. able to sit safely and securely for 2 minutes    4. Standing to sitting 3. controls descent by using hands    5. Pivot transfer  3. able to transfer safely with definite need of hands    6. Standing unsupported with eyes closed 4. able to stand 10 seconds safely     7. Standing unsupported with feet together 0. needs help to attain position and unable to hold for 15 seconds    8. Reaching forward with outstretched arms while standing 2. can reach forward 5 cm (2 inches)    9. Pick up object from the floor from standing 3. able to pick up slipper but needs supervision    10. Turning to look behind over left and right shoulders while standing 3. looks behind one side only, other side shows less weight shift    11. Turn 360 degrees 0. needs assistance while turning    12. Place alternate foot on step or  stool while standing unsupported 0. needs assistance to keep from falling/unable to try    13. Standing unsupported one foot in front 2. able to take small step independently and hold 30 seconds    14. Standing on one leg 0. unable to try of needs assist to prevent fall      Total Score 31/56 Total Score:    Total Score:    Item Test date: 02/08/2024 Date:  Date:   Sitting to standing 3. able to stand independently using hands Insert SmartPhrase OPRCBERGREEVAL Insert SmartPhrase OPRCBERGREEVAL  2. Standing unsupported 4. able to stand safely for 2 minutes    3. Sitting with back unsupported, feet supported 4. able to sit safely and securely for 2 minutes    4. Standing to sitting 3. controls descent by using hands    5. Pivot transfer  3. able to transfer safely with definite need of hands    6. Standing unsupported with eyes closed 3. able to stand 10 seconds with supervision    7. Standing unsupported with feet together 3. able to place feet together independently and stand 1 minute with supervision    8. Reaching forward with outstretched arms while standing 2. can reach forward 5 cm (2 inches)    9. Pick up object from the floor from standing 0. unable to try/needs assistance to keep from losing balance or falling    10. Turning to look behind over left and right shoulders while standing 0. needs assistance to keep from losing balance or falling    11.  Turn 360 degrees 0. needs assistance while turning    12. Place alternate foot on step or stool while standing unsupported 0. needs assistance to keep from falling/unable to try    13. Standing unsupported one foot in front 0. loses balance while stepping or standing    14. Standing on one leg 0. unable to try of needs assist to prevent fall      Total Score 25/56 Total Score:    Total Score:      GAIT: Distance walked: 30 ft Assistive device utilized: Single point cane Level of assistance: Modified independence and SBA Comments: SPC on R, decreased stance R LE, forward flexed, decreased gait velocity.   TREATMENT DATE: 02/15/2023                                                                                                                                   Neuromuscular re education  Standing backward walk with one UE assist, CGA 5 ft  10x3  Difficulty with L LE single leg stance balance observed with stepping back with R LE with pt tendency to bring R LE behind L when stepping back with R LE.   Static deep lunge with one  UE assist, PT min to mod A to promote ability to get up from the ground if pt falls.   L LE 5x2.  Pt buckled secondary to weakness after 2nd repetition of first set, needing mod A to get back up.   R LE 5x2  Reviewed plan of care: continue with another 2x/week for 6 weeks.   Alternating Step Backward with Support  10x3 each LE  Cues for backwards and forward weight shifting.    Therapeutic rest breaks provided secondary to difficulty of certain exercises to allow his muscles to recover for the next activity/task/exercise.        Improved technique, movement at target joints, use of target muscles after mod verbal, visual, tactile cues.       PATIENT EDUCATION:  Education details: POC Person educated: Patient Education method: Explanation Education comprehension: verbalized understanding  HOME EXERCISE PROGRAM: Access Code: [email or web address] URL:  [email or web address] Date: 11/08/2023 Prepared by: Emil Glassman  Exercises - Seated Hip Flexion  - 1 x daily - 7 x weekly - 3 sets - 5 reps - Narrow Stance with Counter Support  - 3 x daily - 7 x weekly - 1 sets - 5 reps - 30 seconds hold - Standing Gastroc Stretch at Counter  - 3 x daily - 7 x weekly - 1 sets - 3 reps - 1 minute hold  - Standing Single Leg Stance with Counter Support  - 1 x daily - 7 x weekly - 3 sets - 10 reps - 5 seconds hold  - Seated Ankle Plantar Flexion with Resistance Loop  - 1 x daily - 7 x weekly - 3 sets - 10 reps  Blue band.  - Lunge with Counter Support  - 1 x daily - 7 x weekly - 3 sets - 10 reps  - Seated Piriformis Stretch with Trunk Bend  - 2-3 x daily - 7 x weekly - 1 sets - 5 reps - 30 seconds to 1 minute hold - Seated Hip Internal Rotation AROM  - 1 x daily - 7 x weekly - 3 sets - 10 reps  - Sidelying Hip Abduction  - 1 x daily - 7 x weekly - 2-3 sets - 5 reps - Alternating Step Backward with Support  - 1 x daily - 7 x weekly - 3 sets - 10 reps   ASSESSMENT:  CLINICAL IMPRESSION:   Difficulty with L LE single leg stance balance observed with stepping back with R LE with pt tendency to bring R LE behind L when stepping back with R LE.  Also worked on LE strengthening to improve ability to get up from then ground if needed. Also worked on forward and backward weight shifting to promote transfer of center of gravity over base of support. Pt tolerated session well without aggravation of symptoms. Pt will benefit from continued skilled physical therapy services to improve strength, balance and function.     OBJECTIVE IMPAIRMENTS: Abnormal gait, decreased balance, difficulty walking, decreased strength, improper body mechanics, and postural dysfunction.   ACTIVITY LIMITATIONS: carrying, lifting, bending, standing, squatting, stairs, transfers, toileting, dressing, and locomotion level  PARTICIPATION LIMITATIONS:   PERSONAL FACTORS:  Age, Fitness, Past/current experiences, Time since onset of injury/illness/exacerbation, and 3+ comorbidities: Cancer, HTN, arthritis are also affecting patient's functional outcome.   REHAB POTENTIAL: Fair    CLINICAL DECISION MAKING: Evolving/moderate complexity; balance is worsening per pt.   EVALUATION COMPLEXITY: Moderate   GOALS: Goals reviewed with patient? Yes  SHORT TERM GOALS: Target date: 10/28/2023  Pt will be independent with his initial HEP to improve LE strength, balance, and function.  Baseline: Pt has not yet started  his initial HEP (10/20/2023); no questions (11/30/2023) Goal status: MET   LONG TERM GOALS: Target date: 02/24/2024  Pt will improve his Berg Balance Test score to at least 48/56 as a demonstration of improved balance and decreased need for AD.  Baseline: 29/56 (10/20/2023); 26/56 (11/28/2023); 31/56 (12/28/2023); 25/56 (02/08/2024) Goal status: ONGOING  2.  Pt will improve his B LE strength by at least 1/2 MMT grade to promote ability to perform transfers and standing tasks with less difficulty and with improved balance.  Baseline:  MMT Right eval Left eval R 11/30/2023 L 11/30/2023 R 12/28/2023 L 12/28/2023  Hip flexion 4 3- 4 4- 4 4  Hip extension (seated manually resisted) 4- 4- 4+ 4+ 5 4+  Hip abduction (seated manually resisted) 4 4 4+ 4+ 4+ 5  Knee flexion 4+ 4 5 4+ 5 4  Knee extension 5 4+ 5 5 5 5    MMT Right eval Left eval R 02/08/2024 L 02/08/2024 R  L   Hip flexion 4 3- 4+ 4+    Hip extension (seated manually resisted) 4- 4- 5 4+    Hip abduction (seated manually resisted) 4 4 5 5     Knee flexion 4+ 4 5 4+    Knee extension 5 4+ 5 5      Goal status: MET   3.  Pt will improve his Activities Specific Balance Confidence (ABC) scale by at least 15% as a demonstration of improved balance.  Baseline:  63.75% (9/18/025); 76% (11/30/2023) Goal status: PARTIALLY MET   PLAN:  PT FREQUENCY: 1-2x/week  PT DURATION: 8  weeks  PLANNED INTERVENTIONS: 97110-Therapeutic exercises, 97530- Therapeutic activity, [id number]- Neuromuscular re-education, 97535- Self Care, 02859- Manual therapy, [id number]- Gait training, Patient/Family education, Balance training, and Stair training.  PLAN FOR NEXT SESSION: posture, trunk, hip and knee strengthening, balance training, manual techniques, modalities PRN.    Annastyn Silvey, PT, DPT 02/15/2024, 9:46 AM  "

## 2024-02-15 NOTE — Telephone Encounter (Signed)
 Prior Auth submitted (Key: [id number]) Your information has been sent to [employer], awaiting approval.

## 2024-02-16 ENCOUNTER — Encounter: Payer: Self-pay | Admitting: Oncology

## 2024-02-16 ENCOUNTER — Inpatient Hospital Stay: Admitting: Oncology

## 2024-02-16 VITALS — BP 122/85 | HR 79 | Temp 97.5°F | Resp 18 | Wt 180.0 lb

## 2024-02-16 DIAGNOSIS — D696 Thrombocytopenia, unspecified: Secondary | ICD-10-CM

## 2024-02-16 DIAGNOSIS — R2681 Unsteadiness on feet: Secondary | ICD-10-CM

## 2024-02-16 DIAGNOSIS — C61 Malignant neoplasm of prostate: Secondary | ICD-10-CM

## 2024-02-16 DIAGNOSIS — Z79818 Long term (current) use of other agents affecting estrogen receptors and estrogen levels: Secondary | ICD-10-CM

## 2024-02-16 NOTE — Assessment & Plan Note (Addendum)
 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  Indeterminate. Right supraclavicular lymph node biopsy confirmed metastatic adenocarcinoma consistent with prostate origin. On androgen deprivation therapy with Firmagon  Labs are reviewed and discussed with patient. PSA has responded well He tolerates Darolutamide  600mg  BID.  Continue current regimen.  I plan to repeat PSMA PET once PSA reaches nadir

## 2024-02-16 NOTE — Progress Notes (Signed)
 Pt here for follow-up. He ha questions regarding medications and regimen.

## 2024-02-16 NOTE — Assessment & Plan Note (Signed)
 Slightly decreased.   Adequate B12 levels. Increase immature platelet fraction, indicating increased peripheral destruction.  Normal spleen size on 10/10/23 MRI abdomen w contrast. 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

## 2024-02-16 NOTE — Assessment & Plan Note (Signed)
 11/10/23 switch to long acting ADT - Eligard , next due in April 2026

## 2024-02-16 NOTE — Assessment & Plan Note (Signed)
 Continue PT

## 2024-02-16 NOTE — Progress Notes (Signed)
 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  Indeterminate. Right supraclavicular lymph node biopsy confirmed metastatic adenocarcinoma consistent with prostate origin. On androgen deprivation therapy with Firmagon  Labs are reviewed and discussed with patient. PSA has responded well He tolerates Darolutamide  600mg  BID.  Continue current regimen.  I plan to repeat PSMA PET once PSA reaches nadir   Encounter for monitoring androgen deprivation therapy 11/10/23 switch to long acting ADT - Eligard , next due in April 2026  Gait instability Continue PT  Thrombocytopenia Slightly decreased.   Adequate B12 levels. Increase immature platelet fraction, indicating increased peripheral destruction.  Normal spleen size on 10/10/23 MRI abdomen w contrast. Possible ITP Monitor     Orders Placed This Encounter  Procedures   CBC with Differential (Cancer Center Only)    Standing Status:   Future    Expected Date:   05/16/2024    Expiration Date:   08/14/2024   CMP (Cancer Center only)    Standing Status:   Future    Expected Date:   05/16/2024    Expiration Date:   08/14/2024   PSA    Standing Status:   Future    Expected Date:   05/16/2024    Expiration Date:   08/14/2024   Immature Platelet Fraction    Standing Status:   Future    Expected Date:    05/16/2024    Expiration Date:   02/15/2025   Ambulatory referral to Genetics    Referral Priority:   Routine    Referral Type:   Consultation    Referral Reason:   Specialty Services Required    Number of Visits Requested:   1   Follow-up in 3 months. We spent sufficient time to discuss many aspect of care, questions were answered to patient's satisfaction.  All questions were answered. The patient knows to call the clinic with any problems, questions or concerns.  Call Babara, MD, PhD [facility] Hematology Oncology 02/16/2024    HISTORY OF PRESENTING ILLNESS:  Leonard Stokes 79 y.o. male presents to establish care for prostate cancer I have reviewed his chart and materials related to his cancer extensively and collaborated history with the patient. Summary of oncologic history is as follows: Oncology History  Prostate cancer metastatic to multiple sites [facility])  06/03/2017 Imaging   CT abdomen pelvis showed 1. Development of bilateral pelvic sidewall adenopathy, consistent with nodal metastasis. 2.  Aortic Atherosclerosis (ICD10-I70.0). 3. Right-sided urinary bladder entering a right inguinal hernia, similar. 4. 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 At that time, biopsy showed Gleason 4+5 and 4+4 involving all cores on the right up to 96% of the prostate.  Staging indicated bilateral external iliac lymphadenopathy up to 3 cm on the left and 1.8 cm on the right.  Bone  scan was negative.  Per neurology note, surgery option was discussed and patient declined.  Patient was started on adjuvant deprivation therapy, status post EBRT and he completed 2.5 years of ADT.  PSA had remained undetectable until 08/29/2023-PSA has increased to 66.    09/01/2023 Imaging   Patient went to emergency room for evaluation of constipation and abdominal/lower back pain.   As part of the workup, patient had a CT chest abdomen pelvis done.  CT chest showed mildly enlarged bilateral hilar lymph node which are indeterminate.  CT abdomen pelvis with contrast showed Significantly enlarged left periaortic adenopathy, most prominently seen in the left periaortic region, bilateral nonobstructive nephrolithiasis, sigmoid diverticulosis without inflammation   09/05/2023 Cancer Staging   Staging form: Prostate, AJCC 8th Edition - Clinical stage from 09/05/2023: Stage IVB (rcTX, rcN1, rcM1, PSA: 75) - Signed by Babara Call, MD on 09/06/2023 Stage prefix: Recurrence Prostate specific antigen (PSA) range: 20 or greater   09/06/2023 Imaging   PSMA PET scan showed   1. Widespread tracer-avid osseous metastatic disease without corresponding sclerotic bone lesions. New from the prior PET-CT. 2. Multiple tracer-avid thoracic, abdominal, and pelvic lymph nodes. New from prior PET-CT. 3. Heterogeneous hepatic uptake with multiple geographic areas of relative increased radiotracer uptake, indeterminate for infiltrative hepatocellular disease. No discrete tracer-avid lesion identified. No suspicious liver lesions on CT from 09/01/2023.   09/09/2023 Procedure   Left supraclavicular lymph node biopsy showed metastatic adenocarcinoma.  Additional IHC staining showed the adenocarcinoma is positive with NKX3.1 and prostein while negative with prostate-specific antigen (PSA).  The immunohistochemical findings are consistent with metastatic prostate adenocarcinoma      09/20/23 started on Darolutamide  600mg  BID. Tolerated well.  He reports feeling well at baseline.   Gait instability/foot drop . He participates in physical therapy. Intermittent shoulder pain. He also plans to see ENT for hearing testing.     MEDICAL HISTORY:  Past Medical History:  Diagnosis Date   Anemia    Arthritis    Atherosclerosis of aorta 2018   BPH (benign prostatic hyperplasia)    Cancer (HCC) 2019    prostate   Chronic kidney disease 08/2016   kidney stone   Diverticulosis of sigmoid colon    History of kidney stones    Hypertension    Leg cramps     SURGICAL HISTORY: Past Surgical History:  Procedure Laterality Date   APPENDECTOMY     EXTRACORPOREAL SHOCK WAVE LITHOTRIPSY Right 08/19/2016   Procedure: EXTRACORPOREAL SHOCK WAVE LITHOTRIPSY (ESWL);  Surgeon: Penne Knee, MD;  Location: [facility] ORS;  Service: Urology;  Laterality: Right;   EXTRACORPOREAL SHOCK WAVE LITHOTRIPSY Left 03/24/2017   Procedure: EXTRACORPOREAL SHOCK WAVE LITHOTRIPSY (ESWL);  Surgeon: Penne Knee, MD;  Location: [facility] ORS;  Service: Urology;  Laterality: Left;   EYE MUSCLE SURGERY     INGUINAL HERNIA REPAIR Right 12/23/2017   Direct hernia, medium Ultra Pro mesh;  Surgeon: Dessa Reyes ORN, MD;  Location: [facility] ORS;  Service: General;  Laterality: Right;   JOINT REPLACEMENT     KNEE ARTHROPLASTY Left 11/15/2016   Procedure: COMPUTER ASSISTED TOTAL KNEE ARTHROPLASTY;  Surgeon: Mardee Lynwood SQUIBB, MD;  Location: [facility] ORS;  Service: Orthopedics;  Laterality: Left;   TOTAL KNEE ARTHROPLASTY Right     SOCIAL HISTORY: Social History   Socioeconomic History   Marital status: Married    Spouse name: Not on file   Number of children: Not on file   Years of education: Not on file   Highest education  level: Not on file  Occupational History   Not on file  Tobacco Use   Smoking status: Former    Current packs/day: 0.00    Average packs/day: 3.0 packs/day for 20.0 years (60.0 ttl pk-yrs)    Types: Cigarettes    Start date: 05/31/1976    Quit date: 05/31/1996    Years since quitting: 27.7   Smokeless tobacco: Never  Vaping Use   Vaping status: Never Used  Substance and Sexual Activity   Alcohol use: Yes    Comment: 4-5 week   Drug use: No   Sexual activity: Yes  Other Topics Concern   Not on file  Social History Narrative   ** Merged History Encounter **       Social Drivers of Health   Tobacco  Use: Medium Risk (02/16/2024)   Patient History    Smoking Tobacco Use: Former    Smokeless Tobacco Use: Never    Passive Exposure: Not on file  Financial Resource Strain: Patient Declined (10/21/2023)   Received from [facility]   Overall Financial Resource Strain (CARDIA)    Difficulty of Paying Living Expenses: Patient declined  Food Insecurity: Patient Declined (10/21/2023)   Received from [facility]   Epic    Within the past 12 months, you worried that your food would run out before you got the money to buy more.: Patient declined    Within the past 12 months, the food you bought just didn't last and you didn't have money to get more.: Patient declined  Transportation Needs: Patient Declined (10/21/2023)   Received from [facility] - Transportation    In the past 12 months, has lack of transportation kept you from medical appointments or from getting medications?: Patient declined    Lack of Transportation (Non-Medical): Patient declined  Physical Activity: Not on file  Stress: No Stress Concern Present (09/05/2023)   [employer] [facility] [employer] - Occupational Stress Questionnaire    Feeling of Stress: Not at all  Social Connections: Not on file  Intimate Partner Violence: Not At Risk (09/05/2023)   Epic    Fear of Current or Ex-Partner: No    Emotionally Abused: No    Physically Abused: No    Sexually Abused: No  Depression (PHQ2-9): Low Risk (11/03/2023)   Depression (PHQ2-9)    PHQ-2 Score: 0  Alcohol Screen: Not on file  Housing: Patient Declined (10/21/2023)   Received from [facility]   Epic    In the last 12 months, was there a time when you were not able to pay the mortgage or rent on time?: Patient declined    In the past 12 months, how many times have you moved where you were living?: 0    At any time in the past 12 months, were you homeless or living in a shelter (including  now)?: Patient declined  Utilities: Patient Declined (10/21/2023)   Received from [facility]   Epic    In the past 12 months has the electric, gas, oil, or water company threatened to shut off services in your home?: Patient declined  Health Literacy: Low Risk (09/20/2023)   Received from [facility] Literacy    How often do you need to have someone help you when you read instructions, pamphlets, or other written material from your doctor or pharmacy?: Never    FAMILY HISTORY: History reviewed. No pertinent family  history.  ALLERGIES:  is allergic to codeine.  MEDICATIONS:  Current Outpatient Medications  Medication Sig Dispense Refill   acetaminophen  (TYLENOL ) 500 MG tablet Take 1,000 mg by mouth every 6 (six) hours as needed for moderate pain.      Ascorbic Acid  (VITAMIN C ) 1000 MG tablet Take 1,000 mg by mouth daily.     atorvastatin (LIPITOR) 20 MG tablet Take 20 mg by mouth daily.     B Complex Vitamins (B COMPLEX-B12 PO) Take 1 tablet by mouth daily.     Calcium Acetate, Phos Binder, (CALCIUM ACETATE PO) Take 2 tablets by mouth daily.     Cholecalciferol  (VITAMIN D-3) 125 MCG (5000 UT) TABS Take 5,000 Units by mouth daily.     cyanocobalamin 1000 MCG tablet Take by mouth.     cyclobenzaprine  (FLEXERIL ) 10 MG tablet Take 1 tablet (10 mg total) by mouth 3 (three) times daily as needed for muscle spasms. 30 tablet 0   darolutamide  (NUBEQA ) 300 MG tablet Take 2 tablets (600 mg total) by mouth 2 (two) times daily with a meal. 120 tablet 1   docusate sodium  (COLACE) 100 MG capsule Take 1 capsule (100 mg total) by mouth 2 (two) times daily. 60 capsule 2   famotidine  (PEPCID ) 20 MG tablet Take 20 mg by mouth at bedtime.     ferrous sulfate  325 (65 FE) MG tablet Take 325 mg by mouth daily with breakfast.     finasteride  (PROSCAR ) 5 MG tablet TAKE ONE TABLET (5 MG) BY MOUTH EVERY OTHER DAY 30 tablet 11   hydrochlorothiazide  (HYDRODIURIL ) 12.5 MG tablet       HYDROcodone -acetaminophen  (NORCO/VICODIN) 5-325 MG tablet Take 1 tablet by mouth every 6 (six) hours as needed for moderate pain (pain score 4-6). 30 tablet 0   ipratropium (ATROVENT) 0.03 % nasal spray SMARTSIG:1-2 Spray(s) Both Nares 3 Times Daily PRN     losartan -hydrochlorothiazide  (HYZAAR) 50-12.5 MG tablet Take 1 tablet by mouth every morning.      meloxicam (MOBIC) 15 MG tablet Take 1 tablet by mouth daily.     mirabegron  ER (MYRBETRIQ ) 50 MG TB24 tablet Take 1 tablet (50 mg total) by mouth daily. 30 tablet 11   Misc Natural Products (OSTEO BI-FLEX TRIPLE STRENGTH PO) Take 1 tablet by mouth daily.      Multiple Vitamins-Minerals (SENIOR MULTIVITAMIN PLUS PO) Take 1 tablet by mouth daily.     NONFORMULARY OR COMPOUNDED ITEM Trimix (30/1/10)-(Pap/Phent/PGE)  Test Dose  1ml vial   Qty #3 Refills 0  [facility] [phone] Fax [phone] 3 each 0   polyethylene glycol (MIRALAX ) 17 g packet Take 17 g by mouth daily. 30 packet 0   tamsulosin  (FLOMAX ) 0.4 MG CAPS capsule TAKE ONE CAPSULE DAILY 30 MINUTES AFTER THE SAME MEAL EACH DAY 30 capsule 11   vitamin E 180 MG (400 UNITS) capsule Take by mouth.     zolpidem  (AMBIEN ) 10 MG tablet Take 10 mg by mouth at bedtime as needed.     No current facility-administered medications for this visit.    Review of Systems  Constitutional:  Positive for fatigue. Negative for appetite change, chills, fever and unexpected weight change.  HENT:   Negative for hearing loss and voice change.   Eyes:  Negative for eye problems and icterus.  Respiratory:  Negative for chest tightness, cough and shortness of breath.   Cardiovascular:  Negative for chest pain and leg swelling.  Gastrointestinal:  Negative for abdominal distention and abdominal pain.  Endocrine:  Negative for hot flashes.  Genitourinary:  Negative for difficulty urinating, dysuria and frequency.   Musculoskeletal:  Positive for arthralgias and gait problem.  Skin:  Negative for  itching and rash.  Neurological:  Positive for gait problem. Negative for light-headedness and numbness.  Hematological:  Negative for adenopathy. Does not bruise/bleed easily.  Psychiatric/Behavioral:  Negative for confusion.      PHYSICAL EXAMINATION: ECOG PERFORMANCE STATUS: 1 - Symptomatic but completely ambulatory  Vitals:   02/16/24 1041  BP: 122/85  Pulse: 79  Resp: 18  Temp: (!) 97.5 F (36.4 C)   Filed Weights   02/16/24 1041  Weight: 180 lb (81.6 kg)    Physical Exam Constitutional:      General: He is not in acute distress.    Appearance: He is not diaphoretic.  HENT:     Head: Normocephalic and atraumatic.  Eyes:     General: No scleral icterus. Cardiovascular:     Rate and Rhythm: Normal rate.  Pulmonary:     Effort: Pulmonary effort is normal. No respiratory distress.     Breath sounds: Normal breath sounds.  Abdominal:     General: There is no distension.     Palpations: Abdomen is soft.     Tenderness: There is no abdominal tenderness.  Musculoskeletal:        General: Normal range of motion.     Cervical back: Normal range of motion and neck supple.  Skin:    General: Skin is warm and dry.     Findings: No erythema.  Neurological:     Mental Status: He is alert and oriented to person, place, and time. Mental status is at baseline.     Motor: No abnormal muscle tone.     Coordination: Coordination normal.  Psychiatric:        Mood and Affect: Mood and affect normal.      LABORATORY DATA:  I have reviewed the data as listed    Latest Ref Rng & Units 02/08/2024   10:31 AM 12/01/2023   10:40 AM 11/03/2023    9:47 AM  CBC  WBC 4.0 - 10.5 K/uL 4.4  3.1  4.5   Hemoglobin 13.0 - 17.0 g/dL 87.6  88.2  89.2   Hematocrit 39.0 - 52.0 % 36.9  34.6  32.6   Platelets 150 - 400 K/uL 98  102  117       Latest Ref Rng & Units 02/08/2024   10:32 AM 12/01/2023   10:40 AM 11/03/2023    9:47 AM  CMP  Glucose 70 - 99 mg/dL 894  895  897   BUN 8 - 23  mg/dL 17  21  19    Creatinine 0.61 - 1.24 mg/dL 9.21  9.18  9.31   Sodium 135 - 145 mmol/L 141  135  136   Potassium 3.5 - 5.1 mmol/L 4.7  4.4  4.8   Chloride 98 - 111 mmol/L 105  103  104   CO2 22 - 32 mmol/L 25  23  25    Calcium 8.9 - 10.3 mg/dL 9.8  9.3  9.3   Total Protein 6.5 - 8.1 g/dL 6.8  6.8  6.8   Total Bilirubin 0.0 - 1.2 mg/dL 0.6  1.0  0.9   Alkaline Phos 38 - 126 U/L 113  116  148   AST 15 - 41 U/L 25  23  25    ALT 0 - 44 U/L 22  20  25  RADIOGRAPHIC STUDIES: I have personally reviewed the radiological images as listed and agreed with the findings in the report. US  CORE BIOPSY (LYMPH NODES) Result Date: 09/09/2023 INDICATION: 79 year old with prostate cancer. Recent PET-CT suggests widespread metastatic disease. Patient presents for ultrasound-guided lymph node biopsy. EXAM: ULTRASOUND-GUIDED CORE BIOPSY OF LEFT SUPRACLAVICULAR LYMPH NODE MEDICATIONS: 1% lidocaine  for local anesthetic ANESTHESIA/SEDATION: None FLUOROSCOPY TIME:  None COMPLICATIONS: None immediate. PROCEDURE: Informed written consent was obtained from the patient after a thorough discussion of the procedural risks, benefits and alternatives. All questions were addressed. A timeout was performed prior to the initiation of the procedure. Ultrasound was used to identify enlarged and abnormal left supraclavicular lymph nodes. A left supraclavicular lymph node was targeted for biopsy. The left lower neck was prepped with chlorhexidine  and sterile field was created. Skin was anesthetized with 1% lidocaine . Small incision was made. Using ultrasound guidance, an 18 gauge core needle was directed into the lymph node and a total of 5 core biopsies were obtained. Specimens placed on a Telfa pad with saline. Bandage placed over the puncture site. Ultrasound images were taken and saved for this procedure. FINDINGS: Enlarged and abnormal left supraclavicular lymph nodes. Most accessible lymph node was biopsied. Five adequate  specimens obtained. No immediate bleeding or hematoma formation. IMPRESSION: Ultrasound-guided core biopsy of a left supraclavicular lymph node. Electronically Signed   By: Juliene Balder M.D.   On: 09/09/2023 15:30   NM PET (PSMA) SKULL TO MID THIGH Addendum Date: 09/09/2023 ADDENDUM #1 EXAM: PROSTATE PET SKULL BASE TO MID THIGHS 09/06/2023 02:38:55 PM TECHNIQUE: PET imaging was obtained from skull vertex to mid thighs. Computed tomography was used for attenuation correction and localization. Fusion imaging was obtained. RADIOPHARMACEUTICAL: 8.65 mCi F-18 flotufolastaf (Posluma ) injected intravenously. COMPARISON: 06/24/2022 CLINICAL HISTORY: Prostate cancer, residual or recurrent disease suspected. No recent biopsy. Diagnosed 2019. Went thru chemo/rad therapy 2019. Lupron  injection. Current PSA 75.44. FINDINGS: PROSTATE AND PROSTATE BED: Normal low-level 60F-FDG uptake within the prostate gland without focal area of hypermetabolism. SUV max within the prostate measures 3.0 cm, axial image 152. LYMPH NODES: Multiple tracer avid thoracic lymph nodes are identified. Index left supraclavicular node measures 7 mm with SUV max of 16.9, axial image 38. Index lower left paratracheal lymph node measures 6 mm with SUV max of 15.4. The index right hilar lymph node has an SUV max of 12.6. Multiple tracer-avid abdominal and pelvic lymph nodes are identified. Index left retroperitoneal lymph node measures 1.2 cm with SUV max of 19.6, axial image 97. Index retrocaval lymph node measures 1.24 cm with SUV max of 20.23, axial image 103. Left posterior pelvic lymph node measures 0.8 cm with SUV max of 10.7. BONES: Widespread tracer-avid osseous metastatic disease identified without corresponding changes on the CT images. Index lesion within the right scapula has an SUV max of 19.1, axial image 46. Index lesion within the posterior aspect of the L4 vertebra has an SUV max of 30, axial image 116. Index lesion in the right iliac bone has  an SUV max of 23.6, axial image 11/29. OTHER PET FINDINGS: There is heterogeneous uptake throughout both lobes of the liver with multiple geographic areas of relative increased radiotracer uptake. Significance of this is indeterminate. No discrete tracer avid lesion identified although underlying infiltrative hepatocellular disease cannot be excluded. On the CT from 09/01/2023, there were no suspicious liver lesions. No tracer avid pulmonary nodules. Aortic atherosclerosis and coronary artery calcifications. Penile prosthesis is identified. IMPRESSION: 1. Widespread tracer-avid osseous metastatic disease without corresponding  sclerotic bone lesions. New from the prior PET-CT. 2. Multiple tracer-avid thoracic, abdominal, and pelvic lymph nodes. New from prior PET-CT. 3. Heterogeneous hepatic uptake with multiple geographic areas of relative increased radiotracer uptake, indeterminate for infiltrative hepatocellular disease. No discrete tracer-avid lesion identified. No suspicious liver lesions on CT from 09/01/2023. Electronically signed by: Waddell Calk MD 09/09/2023 08:42 AM EDT RP Workstation: [id number]   Result Date: 09/09/2023 ORIGINAL REPORT  EXAM: PROSTATE PET SKULL BASE TO MID THIGHS 09/06/2023 02:38:55 PM TECHNIQUE: PET imaging was obtained from skull vertex to mid thighs. Computed tomography was used for attenuation correction and localization. Fusion imaging was obtained. RADIOPHARMACEUTICAL: 8.65 mCi F-18 flotufolastaf (Posluma ) injected intravenously. COMPARISON: 06/24/2022 CLINICAL HISTORY: Prostate cancer, residual or recurrent disease suspected. No recent biopsy. Diagnosed 2019. Went thru chemo/rad therapy 2019. Lupron  injection. Current PSA 75.44. FINDINGS: PROSTATE AND PROSTATE BED: Normal low-level 71F-FDG uptake within the prostate gland without focal area of hypermetabolism. SUV max within the prostate measures 3.0 cm, axial image 152. LYMPH NODES: Multiple tracer rapid thoracic lymph nodes  are identified. Index right supraclavicular node measures 7 mm with SUV max of 16.9, axial image 38. Index lower left paratracheal lymph node measures 6 mm with SUV max of 15.4. The index right hilar lymph node has an SUV max of 12.6. Multiple tracer-avid abdominal and pelvic lymph nodes are identified. Index left retroperitoneal lymph node measures 1.2 cm with SUV max of 19.6, axial image 97. Index retrocaval lymph node measures 1.24 cm with SUV max of 20.23, axial image 103. Left posterior pelvic lymph node measures 0.8 cm with SUV max of 10.7. BONES: Widespread tracer-avid osseous metastatic disease identified without corresponding changes on the CT images. Index lesion within the right scapula has an SUV max of 19.1, axial image 46. Index lesion within the posterior aspect of the L4 vertebra has an SUV max of 30, axial image 116. Index lesion in the right iliac bone has an SUV max of 23.6, axial image 11/29. OTHER PET FINDINGS: There is heterogeneous uptake throughout both lobes of the liver with multiple geographic areas of relative increased radiotracer uptake. Significance of this is indeterminate. No discrete tracer avid lesion identified although underlying infiltrative hepatocellular disease cannot be excluded. On the CT from 09/01/2023, there were no suspicious liver lesions. No tracer avid pulmonary nodules. Aortic atherosclerosis and coronary artery calcifications. Penile prosthesis is identified. IMPRESSION: 1. Widespread tracer-avid osseous metastatic disease without corresponding sclerotic bone lesions. New from the prior PET-CT. 2. Multiple tracer-avid thoracic, abdominal, and pelvic lymph nodes. New from prior PET-CT. 3. Heterogeneous hepatic uptake with multiple geographic areas of relative increased radiotracer uptake, indeterminate for infiltrative hepatocellular disease. No discrete tracer-avid lesion identified. No suspicious liver lesions on CT from 09/01/2023. Electronically signed by:  Waddell Calk MD 09/06/2023 04:09 PM EDT RP Workstation: [id number]   CT Chest W Contrast Result Date: 09/01/2023 CLINICAL DATA:  left posterior rib pain. History of prostate carcinoma. * Tracking Code: BO * EXAM: CT CHEST WITH CONTRAST TECHNIQUE: Multidetector CT imaging of the chest was performed during intravenous contrast administration. RADIATION DOSE REDUCTION: This exam was performed according to the departmental dose-optimization program which includes automated exposure control, adjustment of the mA and/or kV according to patient size and/or use of iterative reconstruction technique. CONTRAST:  75mL OMNIPAQUE  IOHEXOL  300 MG/ML  SOLN COMPARISON:  CT scan chest from 10/02/2014 and PET-CT scan from 06/23/2017. FINDINGS: Cardiovascular: Normal cardiac size. No pericardial effusion. No aortic aneurysm. There are coronary artery calcifications, in keeping with coronary  artery disease. There are also mild-to-moderate peripheral atherosclerotic vascular calcifications of thoracic aorta and its major branches. Mediastinum/Nodes: Visualized thyroid  gland appears grossly unremarkable. No solid / cystic mediastinal masses. The esophagus is nondistended precluding optimal assessment. There are few mildly enlarged bilateral hilar lymph nodes with largest in the right hilum measuring up to 10 x 17 mm. These are indeterminate in etiology. No axillary or mediastinal lymphadenopathy by size criteria. Lungs/Pleura: The central tracheo-bronchial tree is patent. There are patchy areas of linear, plate-like atelectasis and/or scarring throughout bilateral lungs. No mass or consolidation. No pleural effusion or pneumothorax. No suspicious lung nodules. There are multiple sub 5 mm noncalcified opacities in the minor fissure (marked with electronic arrow sign on series 2005), favored to represent intra fissural lymph nodes. No suspicious lung nodules. Upper Abdomen: There is left para-aortic conglomerate nodal mass measuring up  to 2.4 x 3.1 cm. There are additional retroperitoneal lymph nodes as well. Please refer to same-day acquired CT scan abdomen and pelvis report for additional details. Remaining visualized upper abdominal viscera within normal limits. Musculoskeletal: The visualized soft tissues of the chest wall are grossly unremarkable. No suspicious osseous lesions. There are mild multilevel degenerative changes in the visualized spine. IMPRESSION: 1. There are few mildly enlarged bilateral hilar lymph nodes, which are indeterminate in etiology. There are enlarged upper abdominal lymph nodes as well, better evaluated on the same-day acquired CT scan abdomen and pelvis. 2. Otherwise, no metastatic disease identified within the chest. 3. Multiple other nonacute observations, as described above. Aortic Atherosclerosis (ICD10-I70.0). Electronically Signed   By: Ree Molt M.D.   On: 09/01/2023 15:05   CT ABDOMEN PELVIS W CONTRAST Result Date: 09/01/2023 CLINICAL DATA:  Acute lower abdominal pain. History of prostate cancer. EXAM: CT ABDOMEN AND PELVIS WITH CONTRAST TECHNIQUE: Multidetector CT imaging of the abdomen and pelvis was performed using the standard protocol following bolus administration of intravenous contrast. RADIATION DOSE REDUCTION: This exam was performed according to the departmental dose-optimization program which includes automated exposure control, adjustment of the mA and/or kV according to patient size and/or use of iterative reconstruction technique. CONTRAST:  OMNIPAQUE  IOHEXOL  300 MG/ML  SOLN COMPARISON:  Jun 03, 2017. FINDINGS: Lower chest: No acute abnormality. Hepatobiliary: Grossly stable low densities are noted in the hepatic parenchyma most consistent with cysts. No gallstones, gallbladder wall thickening, or biliary dilatation. Pancreas: Unremarkable. No pancreatic ductal dilatation or surrounding inflammatory changes. Spleen: Normal in size without focal abnormality. Adrenals/Urinary Tract:  Adrenal glands are unremarkable. Bilateral nonobstructive nephrolithiasis. No hydronephrosis or renal obstruction is noted. Bladder is unremarkable. Stomach/Bowel: The stomach is unremarkable. Status post appendectomy. No evidence of bowel obstruction or inflammation. Sigmoid diverticulosis without inflammation. Moderate amount of stool seen throughout the colon. Vascular/Lymphatic: Aortic atherosclerosis. Significantly enlarged periaortic adenopathy is noted compared with prior exam. This is most prominently seen in the left periaortic region, with the largest conglomerate measuring 2.5 x 2.0 cm. This is consistent with metastatic disease. Reproductive: Prostate is unremarkable in size. Penile implant reservoir is noted anteriorly in left side of pelvis. Other: No ascites or hernia is noted. Musculoskeletal: Minimal grade 1 anterolisthesis is noted secondary to bilateral L5 spondylolysis. IMPRESSION: Significantly enlarged periaortic adenopathy is noted compared to prior exam, most prominently seen in the left periaortic region, consistent with worsening metastatic disease. Bilateral nonobstructive nephrolithiasis. Sigmoid diverticulosis without inflammation. Aortic Atherosclerosis (ICD10-I70.0). Electronically Signed   By: Lynwood Landy Raddle M.D.   On: 09/01/2023 12:22    "

## 2024-02-17 ENCOUNTER — Ambulatory Visit

## 2024-02-17 ENCOUNTER — Other Ambulatory Visit: Payer: Self-pay | Admitting: Physician Assistant

## 2024-02-17 DIAGNOSIS — R2681 Unsteadiness on feet: Secondary | ICD-10-CM

## 2024-02-17 DIAGNOSIS — N401 Enlarged prostate with lower urinary tract symptoms: Secondary | ICD-10-CM

## 2024-02-17 DIAGNOSIS — Z9181 History of falling: Secondary | ICD-10-CM

## 2024-02-17 DIAGNOSIS — M6281 Muscle weakness (generalized): Secondary | ICD-10-CM

## 2024-02-17 NOTE — Therapy (Signed)
 " OUTPATIENT PHYSICAL THERAPY TREATMENT   Patient Name: Leonard Stokes MRN: [id number] DOB:06-02-1945, 79 y.o., male Today's Date: 02/17/2024  END OF SESSION:  PT End of Session - 02/17/24 0945     Visit Number 22    Number of Visits 33    Date for Recertification  02/24/24    PT Start Time 0945    PT Stop Time 1026    PT Time Calculation (min) 41 min    Activity Tolerance Patient tolerated treatment well    Behavior During Therapy [facility] for tasks assessed/performed                               Past Medical History:  Diagnosis Date   Anemia    Arthritis    Atherosclerosis of aorta 2018   BPH (benign prostatic hyperplasia)    Cancer (HCC) 2019   prostate   Chronic kidney disease 08/2016   kidney stone   Diverticulosis of sigmoid colon    History of kidney stones    Hypertension    Leg cramps    Past Surgical History:  Procedure Laterality Date   APPENDECTOMY     EXTRACORPOREAL SHOCK WAVE LITHOTRIPSY Right 08/19/2016   Procedure: EXTRACORPOREAL SHOCK WAVE LITHOTRIPSY (ESWL);  Surgeon: Penne Knee, MD;  Location: [facility] ORS;  Service: Urology;  Laterality: Right;   EXTRACORPOREAL SHOCK WAVE LITHOTRIPSY Left 03/24/2017   Procedure: EXTRACORPOREAL SHOCK WAVE LITHOTRIPSY (ESWL);  Surgeon: Penne Knee, MD;  Location: [facility] OR[facility];  Service: Urology;  Laterality: Left;   EYE MUSCLE SURGERY     INGUINAL HERNIA REPAIR Right 12/23/2017   Direct hernia, medium Ultra Pro mesh;  Surgeon: Dessa Reyes ORN, MD;  Location: [facility] OR[facility];  Service: General;  Laterality: Right;   JOINT REPLACEMENT     KNEE ARTHROPLASTY Left 11/15/2016   Procedure: COMPUTER ASSISTED TOTAL KNEE ARTHROPLASTY;  Surgeon: Mardee Lynwood SQUIBB, MD;  Location: [facility] OR[facility];  Service: Orthopedics;  Laterality: Left;   TOTAL KNEE ARTHROPLASTY Right    Patient Active Problem List   Diagnosis Date Noted   Thrombocytopenia 12/01/2023   Mood swing 10/05/2023   Gait instability 10/05/2023   Encounter  for monitoring androgen deprivation therapy 09/15/2023   Neoplasm related pain 09/15/2023   Prostate cancer metastatic to multiple sites [facility]) 12/01/2017   Right inguinal hernia 04/15/2017   Hypertension 11/30/2016   S/P total knee arthroplasty 11/15/2016   Personal history of tobacco use, presenting hazards to health 09/26/2014    PCP: Diedra Lame, MD   REFERRING PROVIDER: Babara Call, MD   REFERRING DIAG: C61 (ICD-10-CM) - Prostate cancer metastatic to multiple sites (HCC) R53.1 (ICD-10-CM) - Weakness  Rationale for Evaluation and Treatment: Rehabilitation  THERAPY DIAG:  Unsteadiness on feet  Muscle weakness (generalized)  History of falling  ONSET DATE: 10/05/2023 (Date PT referral signed)  SUBJECTIVE:  SUBJECTIVE STATEMENT: Was very sore B anterior thigh after last session which made going up and down steps difficult.      Planning on going to [place] 11/2024   PERTINENT HISTORY:  Weakness, decreased balance. Uneven surfaces causes him to lose balance. Also is unsteady when he first wakes up in the morning. Difficulty with balance started about 6-8 weeks ago which is worsening. Pt was cleaning up after his dog and lost balance going forward and could not get up. His PT neighbor helped him up. Had difficulty using his L LE to get himself up.    Blood pressure is controlled per pt.  No latex allergies.    No pain currently. Pain is primarily at night, around his abdominal area.   Standing up from a seated position such as a toilet, is very difficult, needs a grab bar assist.      PAIN:  Are you having pain? Yes: NPRS scale: none at the moment.  Pain location: abdominal area and some in the back, might be cancer related.  Pain description: achy Aggravating factors: unknown, does  not have it all the time.  Relieving factors: oxycodone   PRECAUTIONS: Fall and Other: CA  RED FLAGS: Bowel or bladder incontinence: No and Cauda equina syndrome: No   WEIGHT BEARING RESTRICTIONS: No  FALLS:  Has patient fallen in last 6 months? Yes. Number of falls 6, pt states catching himself most of the time. Did not really fall, he just loss balance. Uneven surfaces causes him to lose balance  LIVING ENVIRONMENT: Lives with: lives with their spouse Lives in: House/apartment Stairs: Yes: Internal: 14 steps; on right going up and External: 2 steps; on right going up Has following equipment at home: Single point cane, Walker - 4 wheeled, and Grab bars  OCCUPATION: retired  PLOF: Independent  PATIENT GOALS: getting up from the chair and toilet, and improve B LE strength L > R  NEXT MD VISIT: about 1-2 weeks from now.  OBJECTIVE:  Note: Objective measures were completed at Evaluation unless otherwise noted.  DIAGNOSTIC FINDINGS:    PATIENT SURVEYS:  ABC scale: The Activities-Specific Balance Confidence (ABC) Scale 0% 10 20 30  40 50 60 70 80 90 100% No confidence<->completely confident  How confident are you that you will not lose your balance or become unsteady when you . . .   Date tested 10/20/2023  Walk around the house 50%  2. Walk up or down stairs 50%  3. Bend over and pick up a slipper from in front of a closet floor 0%  4. Reach for a small can off a shelf at eye level 100%  5. Stand on tip toes and reach for something above your head 50%  6. Stand on a chair and reach for something 30%  7. Sweep the floor 60%  8. Walk outside the house to a car parked in the driveway 100%  9. Get into or out of a car 100%  10. Walk across a parking lot to the mall 80%  11. Walk up or down a ramp 80%  12. Walk in a crowded mall where people rapidly walk past you 100%  13. Are bumped into by people as you walk through the mall 80%  14. Step onto or off of an escalator while  you are holding onto the railing 100%  15. Step onto or off an escalator while holding onto parcels such that you cannot hold onto the railing 40%  16. Walk outside on  icy sidewalks 0%  Total: #/16 1020%/16 = 63.75%     COGNITION: Overall cognitive status: Within functional limits for tasks assessed     SENSATION:   MUSCLE LENGTH:   POSTURE: wide base of support, hip and knee flexion, forward flexed posture, R hip ER,   PALPATION:   LUMBAR ROM:   AROM eval  Flexion in sitting  full  Extension WFL (LOB backwards in standing)  Right lateral flexion  WFL  Left lateral flexion WFL  Right rotation in sitting WFL  Left rotation in sitting WFL   (Blank rows = not tested)  LOWER EXTREMITY ROM:     Passive  Right 11/30/2023 Left 11/30/2023  Hip flexion    Hip extension    Hip abduction    Hip adduction    Hip internal rotation    Hip external rotation    Knee flexion    Knee extension    Ankle dorsiflexion -38 (-10 AAROM) -20 (-4 AAROM)  Ankle plantarflexion    Ankle inversion    Ankle eversion     (Blank rows = not tested)  LOWER EXTREMITY MMT:    MMT Right eval Left eval  Hip flexion 4 3-  Hip extension (seated manually resisted) 4- 4-  Hip abduction (seated manually resisted) 4 4  Hip adduction    Hip internal rotation    Hip external rotation    Knee flexion 4+ 4  Knee extension 5 4+  Ankle dorsiflexion    Ankle plantarflexion    Ankle inversion    Ankle eversion     (Blank rows = not tested)  LUMBAR SPECIAL TESTS:  (-) heel to shin test (+) finger to nose on L side.   Difficulty with rapid alternating palms up and down.   FUNCTIONAL TESTS:  Lars Balance Scale:  Item Test date: 10/20/2023 Date:  Date:   Sitting to standing 3. able to stand independently using hands Insert SmartPhrase OPRCBERGREEVAL Insert SmartPhrase OPRCBERGREEVAL  2. Standing unsupported 2. able to stand 30 seconds unsupported    3. Sitting with back unsupported, feet  supported 4. able to sit safely and securely for 2 minutes    4. Standing to sitting 3. controls descent by using hands    5. Pivot transfer  3. able to transfer safely with definite need of hands    6. Standing unsupported with eyes closed 3. able to stand 10 seconds with supervision    7. Standing unsupported with feet together 3. able to place feet together independently and stand 1 minute with supervision    8. Reaching forward with outstretched arms while standing 2. can reach forward 5 cm (2 inches)    9. Pick up object from the floor from standing 3. able to pick up slipper but needs supervision    10. Turning to look behind over left and right shoulders while standing 1. needs supervision when turning    11. Turn 360 degrees 1. needs close supervision or verbal cuing    12. Place alternate foot on step or stool while standing unsupported 0. needs assistance to keep from falling/unable to try    13. Standing unsupported one foot in front 1. needs help to step but can hold 15 seconds    14. Standing on one leg 0. unable to try of needs assist to prevent fall      Total Score 29/56 Total Score:    Total Score:       Score Interpretation: Score of <19  indicates high risk of falls.  Minimally Clinically Important Difference (MCID):  =DGI scores of<21/24 = 1.80 points DGI scores of >21/24 = 0.60 points   [place] T, Inbar-Borovsky N, Brozgol M, Giladi N, [place] JM. The Dynamic Gait Index in healthy older adults: the role of stair climbing, fear of falling and gender. Gait Posture. 2009 Feb;29(2):237-41. doi: 10.1016/j.gaitpost.2008.08.013. Epub 2008 Oct 8. PMID: 81154560; PMCID: EFR7290501.  Pardasaney, MYRTIS LOIS Bonus, GEANNIE POUR., et al. (2012). Sensitivity to change and responsiveness of four balance measures for community-dwelling older adults. Physical therapy 92(3): 388-397.   Item Test date: 11/28/2023 Date:  Date:   Sitting to standing 3. able to stand independently using hands  Insert SmartPhrase OPRCBERGREEVAL Insert SmartPhrase OPRCBERGREEVAL  2. Standing unsupported 3. able to stand 2 minutes with supervision    3. Sitting with back unsupported, feet supported 4. able to sit safely and securely for 2 minutes    4. Standing to sitting 4. sits safely with minimal use of hands    5. Pivot transfer  3. able to transfer safely with definite need of hands    6. Standing unsupported with eyes closed 3. able to stand 10 seconds with supervision    7. Standing unsupported with feet together 0. needs help to attain position and unable to hold for 15 seconds    8. Reaching forward with outstretched arms while standing 0. loses balance while trying/requires external support    9. Pick up object from the floor from standing 3. able to pick up slipper but needs supervision    10. Turning to look behind over left and right shoulders while standing 3. looks behind one side only, other side shows less weight shift    11. Turn 360 degrees 0. needs assistance while turning    12. Place alternate foot on step or stool while standing unsupported 0. needs assistance to keep from falling/unable to try    13. Standing unsupported one foot in front 0. loses balance while stepping or standing    14. Standing on one leg 0. unable to try of needs assist to prevent fall      Total Score 26/56 Total Score:    Total Score:    Item Test date: 12/28/2023 Date:  Date:   Sitting to standing 3. able to stand independently using hands Insert SmartPhrase OPRCBERGREEVAL Insert SmartPhrase OPRCBERGREEVAL  2. Standing unsupported 4. able to stand safely for 2 minutes    3. Sitting with back unsupported, feet supported 4. able to sit safely and securely for 2 minutes    4. Standing to sitting 3. controls descent by using hands    5. Pivot transfer  3. able to transfer safely with definite need of hands    6. Standing unsupported with eyes closed 4. able to stand 10 seconds safely    7. Standing  unsupported with feet together 0. needs help to attain position and unable to hold for 15 seconds    8. Reaching forward with outstretched arms while standing 2. can reach forward 5 cm (2 inches)    9. Pick up object from the floor from standing 3. able to pick up slipper but needs supervision    10. Turning to look behind over left and right shoulders while standing 3. looks behind one side only, other side shows less weight shift    11. Turn 360 degrees 0. needs assistance while turning    12. Place alternate foot on step or stool while standing unsupported  0. needs assistance to keep from falling/unable to try    13. Standing unsupported one foot in front 2. able to take small step independently and hold 30 seconds    14. Standing on one leg 0. unable to try of needs assist to prevent fall      Total Score 31/56 Total Score:    Total Score:    Item Test date: 02/08/2024 Date:  Date:   Sitting to standing 3. able to stand independently using hands Insert SmartPhrase OPRCBERGREEVAL Insert SmartPhrase OPRCBERGREEVAL  2. Standing unsupported 4. able to stand safely for 2 minutes    3. Sitting with back unsupported, feet supported 4. able to sit safely and securely for 2 minutes    4. Standing to sitting 3. controls descent by using hands    5. Pivot transfer  3. able to transfer safely with definite need of hands    6. Standing unsupported with eyes closed 3. able to stand 10 seconds with supervision    7. Standing unsupported with feet together 3. able to place feet together independently and stand 1 minute with supervision    8. Reaching forward with outstretched arms while standing 2. can reach forward 5 cm (2 inches)    9. Pick up object from the floor from standing 0. unable to try/needs assistance to keep from losing balance or falling    10. Turning to look behind over left and right shoulders while standing 0. needs assistance to keep from losing balance or falling    11. Turn 360 degrees  0. needs assistance while turning    12. Place alternate foot on step or stool while standing unsupported 0. needs assistance to keep from falling/unable to try    13. Standing unsupported one foot in front 0. loses balance while stepping or standing    14. Standing on one leg 0. unable to try of needs assist to prevent fall      Total Score 25/56 Total Score:    Total Score:      GAIT: Distance walked: 30 ft Assistive device utilized: Single point cane Level of assistance: Modified independence and SBA Comments: SPC on R, decreased stance R LE, forward flexed, decreased gait velocity.   TREATMENT DATE: 02/17/2023                                                                                                                                   Neuromuscular re education  Standing backward walk with one UE assist, CGA 5 ft  10x3  Difficulty with L LE single leg stance balance observed with stepping back with R LE with pt tendency to bring R LE behind L when stepping back with R LE.   Alternating Step Backward with Support  10x3 each LE  Cues for backwards and forward weight shifting.   Compensatory backwards step reaction to prevent fall 10x, then 8x  PT mod to max  assist. Cues to increase backwards step length to place base of support (foot under center of gravity) when tipping over backwards to decrease fall. Improved ability to self recover after mod to max verbal and visual cues.   Difficulty performing second set to regain balance secondary to fatigue.      Therapeutic rest breaks provided secondary to difficulty of certain exercises/task to allow his muscles to recover for the next activity/task/exercise.        Improved technique, movement at target joints, use of target muscles after mod verbal, visual, tactile cues.       PATIENT EDUCATION:  Education details: POC Person educated: Patient Education method: Explanation Education comprehension: verbalized  understanding  HOME EXERCISE PROGRAM: Access Code: [email or web address] URL: [email or web address] Date: 11/08/2023 Prepared by: Emil Glassman  Exercises - Seated Hip Flexion  - 1 x daily - 7 x weekly - 3 sets - 5 reps - Narrow Stance with Counter Support  - 3 x daily - 7 x weekly - 1 sets - 5 reps - 30 seconds hold - Standing Gastroc Stretch at Counter  - 3 x daily - 7 x weekly - 1 sets - 3 reps - 1 minute hold  - Standing Single Leg Stance with Counter Support  - 1 x daily - 7 x weekly - 3 sets - 10 reps - 5 seconds hold  - Seated Ankle Plantar Flexion with Resistance Loop  - 1 x daily - 7 x weekly - 3 sets - 10 reps  Blue band.  - Lunge with Counter Support  - 1 x daily - 7 x weekly - 3 sets - 10 reps  - Seated Piriformis Stretch with Trunk Bend  - 2-3 x daily - 7 x weekly - 1 sets - 5 reps - 30 seconds to 1 minute hold - Seated Hip Internal Rotation AROM  - 1 x daily - 7 x weekly - 3 sets - 10 reps  - Sidelying Hip Abduction  - 1 x daily - 7 x weekly - 2-3 sets - 5 reps - Alternating Step Backward with Support  - 1 x daily - 7 x weekly - 3 sets - 10 reps   ASSESSMENT:  CLINICAL IMPRESSION: Pt demonstrates tendency for loss of balance backward. Worked on improving pt ability to step back to place his base of support (foot) under his center of gravity to regain balance. Cues needed to increase backwards step length to better able to do so. Pt able to perform an regain balance after cues and practice but decreased ability to perform secondary to fatigue.  Pt tolerated session well without aggravation of symptoms. Pt will benefit from continued skilled physical therapy services to improve strength, balance and function.     OBJECTIVE IMPAIRMENTS: Abnormal gait, decreased balance, difficulty walking, decreased strength, improper body mechanics, and postural dysfunction.   ACTIVITY LIMITATIONS: carrying, lifting, bending, standing, squatting, stairs, transfers, toileting, dressing,  and locomotion level  PARTICIPATION LIMITATIONS:   PERSONAL FACTORS: Age, Fitness, Past/current experiences, Time since onset of injury/illness/exacerbation, and 3+ comorbidities: Cancer, HTN, arthritis are also affecting patient's functional outcome.   REHAB POTENTIAL: Fair    CLINICAL DECISION MAKING: Evolving/moderate complexity; balance is worsening per pt.   EVALUATION COMPLEXITY: Moderate   GOALS: Goals reviewed with patient? Yes  SHORT TERM GOALS: Target date: 10/28/2023  Pt will be independent with his initial HEP to improve LE strength, balance, and function.  Baseline: Pt has not yet started his initial HEP (10/20/2023); no questions (11/30/2023)  Goal status: MET   LONG TERM GOALS: Target date: 02/24/2024  Pt will improve his Lars Balance Test score to at least 48/56 as a demonstration of improved balance and decreased need for AD.  Baseline: 29/56 (10/20/2023); 26/56 (11/28/2023); 31/56 (12/28/2023); 25/56 (02/08/2024) Goal status: ONGOING  2.  Pt will improve his B LE strength by at least 1/2 MMT grade to promote ability to perform transfers and standing tasks with less difficulty and with improved balance.  Baseline:  MMT Right eval Left eval R 11/30/2023 L 11/30/2023 R 12/28/2023 L 12/28/2023  Hip flexion 4 3- 4 4- 4 4  Hip extension (seated manually resisted) 4- 4- 4+ 4+ 5 4+  Hip abduction (seated manually resisted) 4 4 4+ 4+ 4+ 5  Knee flexion 4+ 4 5 4+ 5 4  Knee extension 5 4+ 5 5 5 5    MMT Right eval Left eval R 02/08/2024 L 02/08/2024 R  L   Hip flexion 4 3- 4+ 4+    Hip extension (seated manually resisted) 4- 4- 5 4+    Hip abduction (seated manually resisted) 4 4 5 5     Knee flexion 4+ 4 5 4+    Knee extension 5 4+ 5 5      Goal status: MET   3.  Pt will improve his Activities Specific Balance Confidence (ABC) scale by at least 15% as a demonstration of improved balance.  Baseline:  63.75% (9/18/025); 76% (11/30/2023) Goal status: PARTIALLY  MET   PLAN:  PT FREQUENCY: 1-2x/week  PT DURATION: 8 weeks  PLANNED INTERVENTIONS: 97110-Therapeutic exercises, 97530- Therapeutic activity, [id number]- Neuromuscular re-education, 97535- Self Care, [id number]- Manual therapy, [phone]- Gait training, Patient/Family education, Balance training, and Stair training.  PLAN FOR NEXT SESSION: posture, trunk, hip and knee strengthening, balance training, manual techniques, modalities PRN.    Marielle Mantione, PT, DPT 02/17/2024, 10:41 AM  "

## 2024-02-20 ENCOUNTER — Ambulatory Visit

## 2024-02-21 ENCOUNTER — Ambulatory Visit

## 2024-02-21 DIAGNOSIS — R2681 Unsteadiness on feet: Secondary | ICD-10-CM | POA: Diagnosis not present

## 2024-02-21 DIAGNOSIS — M6281 Muscle weakness (generalized): Secondary | ICD-10-CM

## 2024-02-21 DIAGNOSIS — Z9181 History of falling: Secondary | ICD-10-CM

## 2024-02-21 NOTE — Therapy (Signed)
 " OUTPATIENT PHYSICAL THERAPY TREATMENT   Patient Name: Leonard Stokes MRN: [id number] DOB:1945/02/29, 79 y.o., male Today's Date: 02/21/2024  END OF SESSION:  PT End of Session - 02/21/24 0733     Visit Number 23    Number of Visits 33    Date for Recertification  02/24/24    PT Start Time 0733    PT Stop Time 0812    PT Time Calculation (min) 39 min    Activity Tolerance Patient tolerated treatment well    Behavior During Therapy [facility] for tasks assessed/performed                                Past Medical History:  Diagnosis Date   Anemia    Arthritis    Atherosclerosis of aorta 2018   BPH (benign prostatic hyperplasia)    Cancer (HCC) 2019   prostate   Chronic kidney disease 08/2016   kidney stone   Diverticulosis of sigmoid colon    History of kidney stones    Hypertension    Leg cramps    Past Surgical History:  Procedure Laterality Date   APPENDECTOMY     EXTRACORPOREAL SHOCK WAVE LITHOTRIPSY Right 08/19/2016   Procedure: EXTRACORPOREAL SHOCK WAVE LITHOTRIPSY (ESWL);  Surgeon: Penne Knee, MD;  Location: [facility] ORS;  Service: Urology;  Laterality: Right;   EXTRACORPOREAL SHOCK WAVE LITHOTRIPSY Left 03/24/2017   Procedure: EXTRACORPOREAL SHOCK WAVE LITHOTRIPSY (ESWL);  Surgeon: Penne Knee, MD;  Location: [facility] OR[facility];  Service: Urology;  Laterality: Left;   EYE MUSCLE SURGERY     INGUINAL HERNIA REPAIR Right 12/23/2017   Direct hernia, medium Ultra Pro mesh;  Surgeon: Dessa Reyes ORN, MD;  Location: [facility] OR[facility];  Service: General;  Laterality: Right;   JOINT REPLACEMENT     KNEE ARTHROPLASTY Left 11/15/2016   Procedure: COMPUTER ASSISTED TOTAL KNEE ARTHROPLASTY;  Surgeon: Mardee Lynwood SQUIBB, MD;  Location: [facility] OR[facility];  Service: Orthopedics;  Laterality: Left;   TOTAL KNEE ARTHROPLASTY Right    Patient Active Problem List   Diagnosis Date Noted   Thrombocytopenia 12/01/2023   Mood swing 10/05/2023   Gait instability 10/05/2023   Encounter  for monitoring androgen deprivation therapy 09/15/2023   Neoplasm related pain 09/15/2023   Prostate cancer metastatic to multiple sites [facility]) 12/01/2017   Right inguinal hernia 04/15/2017   Hypertension 11/30/2016   S/P total knee arthroplasty 11/15/2016   Personal history of tobacco use, presenting hazards to health 09/26/2014    PCP: Diedra Lame, MD   REFERRING PROVIDER: Babara Call, MD   REFERRING DIAG: C61 (ICD-10-CM) - Prostate cancer metastatic to multiple sites (HCC) R53.1 (ICD-10-CM) - Weakness  Rationale for Evaluation and Treatment: Rehabilitation  THERAPY DIAG:  Unsteadiness on feet  Muscle weakness (generalized)  History of falling  ONSET DATE: 10/05/2023 (Date PT referral signed)  SUBJECTIVE:  SUBJECTIVE STATEMENT: Pt was walking the other day, he turned around real quickly, caught himself and did not fall. No dizziness, just turned too fast. Has a lot of HEP, would like to focus on the most important ones.        Planning on going to [place] 11/2024   PERTINENT HISTORY:  Weakness, decreased balance. Uneven surfaces causes him to lose balance. Also is unsteady when he first wakes up in the morning. Difficulty with balance started about 6-8 weeks ago which is worsening. Pt was cleaning up after his dog and lost balance going forward and could not get up. His PT neighbor helped him up. Had difficulty using his L LE to get himself up.    Blood pressure is controlled per pt.  No latex allergies.    No pain currently. Pain is primarily at night, around his abdominal area.   Standing up from a seated position such as a toilet, is very difficult, needs a grab bar assist.      PAIN:  Are you having pain? Yes: NPRS scale: none at the moment.  Pain location: abdominal area and  some in the back, might be cancer related.  Pain description: achy Aggravating factors: unknown, does not have it all the time.  Relieving factors: oxycodone   PRECAUTIONS: Fall and Other: CA  RED FLAGS: Bowel or bladder incontinence: No and Cauda equina syndrome: No   WEIGHT BEARING RESTRICTIONS: No  FALLS:  Has patient fallen in last 6 months? Yes. Number of falls 6, pt states catching himself most of the time. Did not really fall, he just loss balance. Uneven surfaces causes him to lose balance  LIVING ENVIRONMENT: Lives with: lives with their spouse Lives in: House/apartment Stairs: Yes: Internal: 14 steps; on right going up and External: 2 steps; on right going up Has following equipment at home: Single point cane, Walker - 4 wheeled, and Grab bars  OCCUPATION: retired  PLOF: Independent  PATIENT GOALS: getting up from the chair and toilet, and improve B LE strength L > R  NEXT MD VISIT: about 1-2 weeks from now.  OBJECTIVE:  Note: Objective measures were completed at Evaluation unless otherwise noted.  DIAGNOSTIC FINDINGS:    PATIENT SURVEYS:  ABC scale: The Activities-Specific Balance Confidence (ABC) Scale 0% 10 20 30  40 50 60 70 80 90 100% No confidence<->completely confident  How confident are you that you will not lose your balance or become unsteady when you . . .   Date tested 10/20/2023  Walk around the house 50%  2. Walk up or down stairs 50%  3. Bend over and pick up a slipper from in front of a closet floor 0%  4. Reach for a small can off a shelf at eye level 100%  5. Stand on tip toes and reach for something above your head 50%  6. Stand on a chair and reach for something 30%  7. Sweep the floor 60%  8. Walk outside the house to a car parked in the driveway 100%  9. Get into or out of a car 100%  10. Walk across a parking lot to the mall 80%  11. Walk up or down a ramp 80%  12. Walk in a crowded mall where people rapidly walk past you 100%  13.  Are bumped into by people as you walk through the mall 80%  14. Step onto or off of an escalator while you are holding onto the railing 100%  15. Step onto  or off an escalator while holding onto parcels such that you cannot hold onto the railing 40%  16. Walk outside on icy sidewalks 0%  Total: #/16 1020%/16 = 63.75%     COGNITION: Overall cognitive status: Within functional limits for tasks assessed     SENSATION:   MUSCLE LENGTH:   POSTURE: wide base of support, hip and knee flexion, forward flexed posture, R hip ER,   PALPATION:   LUMBAR ROM:   AROM eval  Flexion in sitting  full  Extension WFL (LOB backwards in standing)  Right lateral flexion  WFL  Left lateral flexion WFL  Right rotation in sitting WFL  Left rotation in sitting WFL   (Blank rows = not tested)  LOWER EXTREMITY ROM:     Passive  Right 11/30/2023 Left 11/30/2023  Hip flexion    Hip extension    Hip abduction    Hip adduction    Hip internal rotation    Hip external rotation    Knee flexion    Knee extension    Ankle dorsiflexion -38 (-10 AAROM) -20 (-4 AAROM)  Ankle plantarflexion    Ankle inversion    Ankle eversion     (Blank rows = not tested)  LOWER EXTREMITY MMT:    MMT Right eval Left eval  Hip flexion 4 3-  Hip extension (seated manually resisted) 4- 4-  Hip abduction (seated manually resisted) 4 4  Hip adduction    Hip internal rotation    Hip external rotation    Knee flexion 4+ 4  Knee extension 5 4+  Ankle dorsiflexion    Ankle plantarflexion    Ankle inversion    Ankle eversion     (Blank rows = not tested)  LUMBAR SPECIAL TESTS:  (-) heel to shin test (+) finger to nose on L side.   Difficulty with rapid alternating palms up and down.   FUNCTIONAL TESTS:  Lars Balance Scale:  Item Test date: 10/20/2023 Date:  Date:   Sitting to standing 3. able to stand independently using hands Insert SmartPhrase OPRCBERGREEVAL Insert SmartPhrase OPRCBERGREEVAL  2.  Standing unsupported 2. able to stand 30 seconds unsupported    3. Sitting with back unsupported, feet supported 4. able to sit safely and securely for 2 minutes    4. Standing to sitting 3. controls descent by using hands    5. Pivot transfer  3. able to transfer safely with definite need of hands    6. Standing unsupported with eyes closed 3. able to stand 10 seconds with supervision    7. Standing unsupported with feet together 3. able to place feet together independently and stand 1 minute with supervision    8. Reaching forward with outstretched arms while standing 2. can reach forward 5 cm (2 inches)    9. Pick up object from the floor from standing 3. able to pick up slipper but needs supervision    10. Turning to look behind over left and right shoulders while standing 1. needs supervision when turning    11. Turn 360 degrees 1. needs close supervision or verbal cuing    12. Place alternate foot on step or stool while standing unsupported 0. needs assistance to keep from falling/unable to try    13. Standing unsupported one foot in front 1. needs help to step but can hold 15 seconds    14. Standing on one leg 0. unable to try of needs assist to prevent fall  Total Score 29/56 Total Score:    Total Score:       Score Interpretation: Score of <19 indicates high risk of falls.  Minimally Clinically Important Difference (MCID):  =DGI scores of<21/24 = 1.80 points DGI scores of >21/24 = 0.60 points   Rincon T, Inbar-Borovsky N, Brozgol M, Giladi N, [place] JM. The Dynamic Gait Index in healthy older adults: the role of stair climbing, fear of falling and gender. Gait Posture. 2009 Feb;29(2):237-41. doi: 10.1016/j.gaitpost.2008.08.013. Epub 2008 Oct 8. PMID: 81154560; PMCID: EFR7290501.  Pardasaney, MYRTIS LOIS Bonus, GEANNIE POUR., et al. (2012). Sensitivity to change and responsiveness of four balance measures for community-dwelling older adults. Physical therapy 92(3): 388-397.   Item  Test date: 11/28/2023 Date:  Date:   Sitting to standing 3. able to stand independently using hands Insert SmartPhrase OPRCBERGREEVAL Insert SmartPhrase OPRCBERGREEVAL  2. Standing unsupported 3. able to stand 2 minutes with supervision    3. Sitting with back unsupported, feet supported 4. able to sit safely and securely for 2 minutes    4. Standing to sitting 4. sits safely with minimal use of hands    5. Pivot transfer  3. able to transfer safely with definite need of hands    6. Standing unsupported with eyes closed 3. able to stand 10 seconds with supervision    7. Standing unsupported with feet together 0. needs help to attain position and unable to hold for 15 seconds    8. Reaching forward with outstretched arms while standing 0. loses balance while trying/requires external support    9. Pick up object from the floor from standing 3. able to pick up slipper but needs supervision    10. Turning to look behind over left and right shoulders while standing 3. looks behind one side only, other side shows less weight shift    11. Turn 360 degrees 0. needs assistance while turning    12. Place alternate foot on step or stool while standing unsupported 0. needs assistance to keep from falling/unable to try    13. Standing unsupported one foot in front 0. loses balance while stepping or standing    14. Standing on one leg 0. unable to try of needs assist to prevent fall      Total Score 26/56 Total Score:    Total Score:    Item Test date: 12/28/2023 Date:  Date:   Sitting to standing 3. able to stand independently using hands Insert SmartPhrase OPRCBERGREEVAL Insert SmartPhrase OPRCBERGREEVAL  2. Standing unsupported 4. able to stand safely for 2 minutes    3. Sitting with back unsupported, feet supported 4. able to sit safely and securely for 2 minutes    4. Standing to sitting 3. controls descent by using hands    5. Pivot transfer  3. able to transfer safely with definite need of hands     6. Standing unsupported with eyes closed 4. able to stand 10 seconds safely    7. Standing unsupported with feet together 0. needs help to attain position and unable to hold for 15 seconds    8. Reaching forward with outstretched arms while standing 2. can reach forward 5 cm (2 inches)    9. Pick up object from the floor from standing 3. able to pick up slipper but needs supervision    10. Turning to look behind over left and right shoulders while standing 3. looks behind one side only, other side shows less weight shift    11. Turn  360 degrees 0. needs assistance while turning    12. Place alternate foot on step or stool while standing unsupported 0. needs assistance to keep from falling/unable to try    13. Standing unsupported one foot in front 2. able to take small step independently and hold 30 seconds    14. Standing on one leg 0. unable to try of needs assist to prevent fall      Total Score 31/56 Total Score:    Total Score:    Item Test date: 02/08/2024 Date:  Date:   Sitting to standing 3. able to stand independently using hands Insert SmartPhrase OPRCBERGREEVAL Insert SmartPhrase OPRCBERGREEVAL  2. Standing unsupported 4. able to stand safely for 2 minutes    3. Sitting with back unsupported, feet supported 4. able to sit safely and securely for 2 minutes    4. Standing to sitting 3. controls descent by using hands    5. Pivot transfer  3. able to transfer safely with definite need of hands    6. Standing unsupported with eyes closed 3. able to stand 10 seconds with supervision    7. Standing unsupported with feet together 3. able to place feet together independently and stand 1 minute with supervision    8. Reaching forward with outstretched arms while standing 2. can reach forward 5 cm (2 inches)    9. Pick up object from the floor from standing 0. unable to try/needs assistance to keep from losing balance or falling    10. Turning to look behind over left and right shoulders while  standing 0. needs assistance to keep from losing balance or falling    11. Turn 360 degrees 0. needs assistance while turning    12. Place alternate foot on step or stool while standing unsupported 0. needs assistance to keep from falling/unable to try    13. Standing unsupported one foot in front 0. loses balance while stepping or standing    14. Standing on one leg 0. unable to try of needs assist to prevent fall      Total Score 25/56 Total Score:    Total Score:      GAIT: Distance walked: 30 ft Assistive device utilized: Single point cane Level of assistance: Modified independence and SBA Comments: SPC on R, decreased stance R LE, forward flexed, decreased gait velocity.   TREATMENT DATE: 02/21/2023                                                                                                                                 Neuromuscular re education  Adjusted HEP to most important ones per pt request. Pt verbalized and demonstrated understanding.     Alternating Step Backward with Support  10x3 each LE  Cues for backwards and forward weight shifting.   Standing backward walk with one UE assist, CGA 5 ft  10x2   Compensatory backwards step reaction to prevent fall 10x2  PT mod to max assist. Cues to increase backwards step length to place base of support (foot under center of gravity) when tipping over backwards to decrease fall. Improved ability to self recover after mod to max verbal and visual cues.     Improved ability to step back and regain balance during 2nd set.    Therapeutic rest breaks provided secondary to difficulty of certain exercises/task to allow his muscles to recover for the next activity/task/exercise.    Plan: Work on gait with turns as well next visit if appropriate    Improved technique, movement at target joints, use of target muscles after mod verbal, visual, tactile cues.       PATIENT EDUCATION:  Education details: POC Person educated:  Patient Education method: Explanation Education comprehension: verbalized understanding  HOME EXERCISE PROGRAM: Access Code: [email or web address] URL: [email or web address] Date: 11/08/2023 Prepared by: Emil Glassman  Exercises - Seated Hip Flexion  - 1 x daily - 7 x weekly - 3 sets - 5 reps - Narrow Stance with Counter Support  - 3 x daily - 7 x weekly - 1 sets - 5 reps - 30 seconds hold - Standing Gastroc Stretch at Counter  - 3 x daily - 7 x weekly - 1 sets - 3 reps - 1 minute hold  - Standing Single Leg Stance with Counter Support  - 1 x daily - 7 x weekly - 3 sets - 10 reps - 5 seconds hold  - Seated Ankle Plantar Flexion with Resistance Loop  - 1 x daily - 7 x weekly - 3 sets - 10 reps  Blue band.  - Lunge with Counter Support  - 1 x daily - 7 x weekly - 3 sets - 10 reps  - Seated Piriformis Stretch with Trunk Bend  - 2-3 x daily - 7 x weekly - 1 sets - 5 reps - 30 seconds to 1 minute hold - Seated Hip Internal Rotation AROM  - 1 x daily - 7 x weekly - 3 sets - 10 reps  - Sidelying Hip Abduction  - 1 x daily - 7 x weekly - 2-3 sets - 5 reps - Alternating Step Backward with Support  - 1 x daily - 7 x weekly - 3 sets - 10 reps  Updated exercises 02/21/2024 Access Code: [email or web address] URL: [email or web address] Date: 02/21/2024 Prepared by: Emil Glassman  Exercises - Narrow Stance with Counter Support  - 3 x daily - 7 x weekly - 1 sets - 5 reps - 30 seconds hold - Standing Gastroc Stretch at Counter  - 3 x daily - 7 x weekly - 1 sets - 3 reps - 1 minute hold - Standing Single Leg Stance with Counter Support  - 1 x daily - 7 x weekly - 3 sets - 10 reps - 5 seconds hold - Seated Ankle Plantar Flexion with Resistance Loop  - 1 x daily - 7 x weekly - 3 sets - 10 reps  Blue band.   - Lunge with Counter Support  - 1 x daily - 7 x weekly - 3 sets - 10 reps - Sidelying Hip Abduction  - 1 x daily - 7 x weekly - 2-3 sets - 5 reps - Alternating Step Backward with Support  - 1  x daily - 7 x weekly - 3 sets - 10 reps    ASSESSMENT:  CLINICAL IMPRESSION: Continued working on improving pt ability to step back to place his base of support (foot) under his center of  gravity to regain balance. Cues needed to increase backwards step length to better able to do so. Pt able to perform and regain balance after cues and practice. Pt tolerated session well without aggravation of symptoms. Pt will benefit from continued skilled physical therapy services to improve strength, balance and function.     OBJECTIVE IMPAIRMENTS: Abnormal gait, decreased balance, difficulty walking, decreased strength, improper body mechanics, and postural dysfunction.   ACTIVITY LIMITATIONS: carrying, lifting, bending, standing, squatting, stairs, transfers, toileting, dressing, and locomotion level  PARTICIPATION LIMITATIONS:   PERSONAL FACTORS: Age, Fitness, Past/current experiences, Time since onset of injury/illness/exacerbation, and 3+ comorbidities: Cancer, HTN, arthritis are also affecting patient's functional outcome.   REHAB POTENTIAL: Fair    CLINICAL DECISION MAKING: Evolving/moderate complexity; balance is worsening per pt.   EVALUATION COMPLEXITY: Moderate   GOALS: Goals reviewed with patient? Yes  SHORT TERM GOALS: Target date: 10/28/2023  Pt will be independent with his initial HEP to improve LE strength, balance, and function.  Baseline: Pt has not yet started his initial HEP (10/20/2023); no questions (11/30/2023) Goal status: MET   LONG TERM GOALS: Target date: 02/24/2024  Pt will improve his Berg Balance Test score to at least 48/56 as a demonstration of improved balance and decreased need for AD.  Baseline: 29/56 (10/20/2023); 26/56 (11/28/2023); 31/56 (12/28/2023); 25/56 (02/08/2024) Goal status: ONGOING  2.  Pt will improve his B LE strength by at least 1/2 MMT grade to promote ability to perform transfers and standing tasks with less difficulty and with improved  balance.  Baseline:  MMT Right eval Left eval R 11/30/2023 L 11/30/2023 R 12/28/2023 L 12/28/2023  Hip flexion 4 3- 4 4- 4 4  Hip extension (seated manually resisted) 4- 4- 4+ 4+ 5 4+  Hip abduction (seated manually resisted) 4 4 4+ 4+ 4+ 5  Knee flexion 4+ 4 5 4+ 5 4  Knee extension 5 4+ 5 5 5 5    MMT Right eval Left eval R 02/08/2024 L 02/08/2024 R  L   Hip flexion 4 3- 4+ 4+    Hip extension (seated manually resisted) 4- 4- 5 4+    Hip abduction (seated manually resisted) 4 4 5 5     Knee flexion 4+ 4 5 4+    Knee extension 5 4+ 5 5      Goal status: MET   3.  Pt will improve his Activities Specific Balance Confidence (ABC) scale by at least 15% as a demonstration of improved balance.  Baseline:  63.75% (9/18/025); 76% (11/30/2023) Goal status: PARTIALLY MET   PLAN:  PT FREQUENCY: 1-2x/week  PT DURATION: 8 weeks  PLANNED INTERVENTIONS: 97110-Therapeutic exercises, 97530- Therapeutic activity, [id number]- Neuromuscular re-education, 97535- Self Care, [id number]- Manual therapy, [phone]- Gait training, Patient/Family education, Balance training, and Stair training.  PLAN FOR NEXT SESSION: posture, trunk, hip and knee strengthening, balance training, manual techniques, modalities PRN.    Marlon Vonruden, PT, DPT 02/21/2024, 4:35 PM  "

## 2024-02-23 ENCOUNTER — Inpatient Hospital Stay: Admitting: Licensed Clinical Social Worker

## 2024-02-23 ENCOUNTER — Inpatient Hospital Stay

## 2024-02-24 ENCOUNTER — Ambulatory Visit

## 2024-02-24 DIAGNOSIS — M6281 Muscle weakness (generalized): Secondary | ICD-10-CM

## 2024-02-24 DIAGNOSIS — R2681 Unsteadiness on feet: Secondary | ICD-10-CM | POA: Diagnosis not present

## 2024-02-24 DIAGNOSIS — Z9181 History of falling: Secondary | ICD-10-CM

## 2024-02-24 NOTE — Therapy (Signed)
 " OUTPATIENT PHYSICAL THERAPY TREATMENT   Patient Name: Leonard Stokes MRN: [id number] DOB:1945/07/10, 79 y.o., male Today's Date: 02/24/2024  END OF SESSION:  PT End of Session - 02/24/24 0952     Visit Number 24    Number of Visits 33    Date for Recertification  04/20/24    PT Start Time 0952    PT Stop Time 1034    PT Time Calculation (min) 42 min    Activity Tolerance Patient tolerated treatment well    Behavior During Therapy [facility] for tasks assessed/performed                                 Past Medical History:  Diagnosis Date   Anemia    Arthritis    Atherosclerosis of aorta 2018   BPH (benign prostatic hyperplasia)    Cancer (HCC) 2019   prostate   Chronic kidney disease 08/2016   kidney stone   Diverticulosis of sigmoid colon    History of kidney stones    Hypertension    Leg cramps    Past Surgical History:  Procedure Laterality Date   APPENDECTOMY     EXTRACORPOREAL SHOCK WAVE LITHOTRIPSY Right 08/19/2016   Procedure: EXTRACORPOREAL SHOCK WAVE LITHOTRIPSY (ESWL);  Surgeon: Penne Knee, MD;  Location: [facility] ORS;  Service: Urology;  Laterality: Right;   EXTRACORPOREAL SHOCK WAVE LITHOTRIPSY Left 03/24/2017   Procedure: EXTRACORPOREAL SHOCK WAVE LITHOTRIPSY (ESWL);  Surgeon: Penne Knee, MD;  Location: [facility] OR[facility];  Service: Urology;  Laterality: Left;   EYE MUSCLE SURGERY     INGUINAL HERNIA REPAIR Right 12/23/2017   Direct hernia, medium Ultra Pro mesh;  Surgeon: Dessa Reyes ORN, MD;  Location: [facility] OR[facility];  Service: General;  Laterality: Right;   JOINT REPLACEMENT     KNEE ARTHROPLASTY Left 11/15/2016   Procedure: COMPUTER ASSISTED TOTAL KNEE ARTHROPLASTY;  Surgeon: Mardee Lynwood SQUIBB, MD;  Location: [facility] OR[facility];  Service: Orthopedics;  Laterality: Left;   TOTAL KNEE ARTHROPLASTY Right    Patient Active Problem List   Diagnosis Date Noted   Thrombocytopenia 12/01/2023   Mood swing 10/05/2023   Gait instability 10/05/2023    Encounter for monitoring androgen deprivation therapy 09/15/2023   Neoplasm related pain 09/15/2023   Prostate cancer metastatic to multiple sites [facility]) 12/01/2017   Right inguinal hernia 04/15/2017   Hypertension 11/30/2016   S/P total knee arthroplasty 11/15/2016   Personal history of tobacco use, presenting hazards to health 09/26/2014    PCP: Diedra Lame, MD   REFERRING PROVIDER: Babara Call, MD   REFERRING DIAG: C61 (ICD-10-CM) - Prostate cancer metastatic to multiple sites (HCC) R53.1 (ICD-10-CM) - Weakness  Rationale for Evaluation and Treatment: Rehabilitation  THERAPY DIAG:  Unsteadiness on feet  Muscle weakness (generalized)  History of falling  ONSET DATE: 10/05/2023 (Date PT referral signed)  SUBJECTIVE:  SUBJECTIVE STATEMENT: Pt was walking the other day, he turned around real quickly, caught himself and did not fall. No dizziness, just turned too fast. Has a lot of HEP, would like to focus on the most important ones.        Planning on going to [place] 11/2024   PERTINENT HISTORY:  Weakness, decreased balance. Uneven surfaces causes him to lose balance. Also is unsteady when he first wakes up in the morning. Difficulty with balance started about 6-8 weeks ago which is worsening. Pt was cleaning up after his dog and lost balance going forward and could not get up. His PT neighbor helped him up. Had difficulty using his L LE to get himself up.    Blood pressure is controlled per pt.  No latex allergies.    No pain currently. Pain is primarily at night, around his abdominal area.   Standing up from a seated position such as a toilet, is very difficult, needs a grab bar assist.      PAIN:  Are you having pain? Yes: NPRS scale: none at the moment.  Pain location: abdominal  area and some in the back, might be cancer related.  Pain description: achy Aggravating factors: unknown, does not have it all the time.  Relieving factors: oxycodone   PRECAUTIONS: Fall and Other: CA  RED FLAGS: Bowel or bladder incontinence: No and Cauda equina syndrome: No   WEIGHT BEARING RESTRICTIONS: No  FALLS:  Has patient fallen in last 6 months? Yes. Number of falls 6, pt states catching himself most of the time. Did not really fall, he just loss balance. Uneven surfaces causes him to lose balance  LIVING ENVIRONMENT: Lives with: lives with their spouse Lives in: House/apartment Stairs: Yes: Internal: 14 steps; on right going up and External: 2 steps; on right going up Has following equipment at home: Single point cane, Walker - 4 wheeled, and Grab bars  OCCUPATION: retired  PLOF: Independent  PATIENT GOALS: getting up from the chair and toilet, and improve B LE strength L > R  NEXT MD VISIT: about 1-2 weeks from now.  OBJECTIVE:  Note: Objective measures were completed at Evaluation unless otherwise noted.  DIAGNOSTIC FINDINGS:    PATIENT SURVEYS:  ABC scale: The Activities-Specific Balance Confidence (ABC) Scale 0% 10 20 30  40 50 60 70 80 90 100% No confidence<->completely confident  How confident are you that you will not lose your balance or become unsteady when you . . .   Date tested 10/20/2023  Walk around the house 50%  2. Walk up or down stairs 50%  3. Bend over and pick up a slipper from in front of a closet floor 0%  4. Reach for a small can off a shelf at eye level 100%  5. Stand on tip toes and reach for something above your head 50%  6. Stand on a chair and reach for something 30%  7. Sweep the floor 60%  8. Walk outside the house to a car parked in the driveway 100%  9. Get into or out of a car 100%  10. Walk across a parking lot to the mall 80%  11. Walk up or down a ramp 80%  12. Walk in a crowded mall where people rapidly walk past you  100%  13. Are bumped into by people as you walk through the mall 80%  14. Step onto or off of an escalator while you are holding onto the railing 100%  15. Step onto  or off an escalator while holding onto parcels such that you cannot hold onto the railing 40%  16. Walk outside on icy sidewalks 0%  Total: #/16 1020%/16 = 63.75%    The Activities-Specific Balance Confidence (ABC) Scale 0% 10 20 30  40 50 60 70 80 90 100% No confidence<->completely confident  How confident are you that you will not lose your balance or become unsteady when you . . .   Date tested 02/24/2024  Walk around the house 100%  2. Walk up or down stairs 100%  3. Bend over and pick up a slipper from in front of a closet floor 80%  4. Reach for a small can off a shelf at eye level 100%  5. Stand on tip toes and reach for something above your head 70%  6. Stand on a chair and reach for something 0%  7. Sweep the floor 100%  8. Walk outside the house to a car parked in the driveway 100%  9. Get into or out of a car 100%  10. Walk across a parking lot to the mall 100%  11. Walk up or down a ramp 80%  12. Walk in a crowded mall where people rapidly walk past you 90%  13. Are bumped into by people as you walk through the mall 60%  14. Step onto or off of an escalator while you are holding onto the railing 100%  15. Step onto or off an escalator while holding onto parcels such that you cannot hold onto the railing 50%  16. Walk outside on icy sidewalks 10%  Total: #/16 77.5%    COGNITION: Overall cognitive status: Within functional limits for tasks assessed     SENSATION:   MUSCLE LENGTH:   POSTURE: wide base of support, hip and knee flexion, forward flexed posture, R hip ER,   PALPATION:   LUMBAR ROM:   AROM eval  Flexion in sitting  full  Extension WFL (LOB backwards in standing)  Right lateral flexion  WFL  Left lateral flexion WFL  Right rotation in sitting WFL  Left rotation in sitting WFL    (Blank rows = not tested)  LOWER EXTREMITY ROM:     Passive  Right 11/30/2023 Left 11/30/2023  Hip flexion    Hip extension    Hip abduction    Hip adduction    Hip internal rotation    Hip external rotation    Knee flexion    Knee extension    Ankle dorsiflexion -38 (-10 AAROM) -20 (-4 AAROM)  Ankle plantarflexion    Ankle inversion    Ankle eversion     (Blank rows = not tested)  LOWER EXTREMITY MMT:    MMT Right eval Left eval  Hip flexion 4 3-  Hip extension (seated manually resisted) 4- 4-  Hip abduction (seated manually resisted) 4 4  Hip adduction    Hip internal rotation    Hip external rotation    Knee flexion 4+ 4  Knee extension 5 4+  Ankle dorsiflexion    Ankle plantarflexion    Ankle inversion    Ankle eversion     (Blank rows = not tested)  LUMBAR SPECIAL TESTS:  (-) heel to shin test (+) finger to nose on L side.   Difficulty with rapid alternating palms up and down.   FUNCTIONAL TESTS:  Lars Balance Scale:  Item Test date: 10/20/2023 Date:  Date:   Sitting to standing 3. able to stand independently using hands  Insert SmartPhrase OPRCBERGREEVAL Insert SmartPhrase OPRCBERGREEVAL  2. Standing unsupported 2. able to stand 30 seconds unsupported    3. Sitting with back unsupported, feet supported 4. able to sit safely and securely for 2 minutes    4. Standing to sitting 3. controls descent by using hands    5. Pivot transfer  3. able to transfer safely with definite need of hands    6. Standing unsupported with eyes closed 3. able to stand 10 seconds with supervision    7. Standing unsupported with feet together 3. able to place feet together independently and stand 1 minute with supervision    8. Reaching forward with outstretched arms while standing 2. can reach forward 5 cm (2 inches)    9. Pick up object from the floor from standing 3. able to pick up slipper but needs supervision    10. Turning to look behind over left and right shoulders while  standing 1. needs supervision when turning    11. Turn 360 degrees 1. needs close supervision or verbal cuing    12. Place alternate foot on step or stool while standing unsupported 0. needs assistance to keep from falling/unable to try    13. Standing unsupported one foot in front 1. needs help to step but can hold 15 seconds    14. Standing on one leg 0. unable to try of needs assist to prevent fall      Total Score 29/56 Total Score:    Total Score:       Score Interpretation: Score of <19 indicates high risk of falls.  Minimally Clinically Important Difference (MCID):  =DGI scores of<21/24 = 1.80 points DGI scores of >21/24 = 0.60 points   Moore T, Inbar-Borovsky N, Brozgol M, Giladi N, [place] JM. The Dynamic Gait Index in healthy older adults: the role of stair climbing, fear of falling and gender. Gait Posture. 2009 Feb;29(2):237-41. doi: 10.1016/j.gaitpost.2008.08.013. Epub 2008 Oct 8. PMID: 81154560; PMCID: EFR7290501.  Pardasaney, MYRTIS LOIS Bonus, GEANNIE POUR., et al. (2012). Sensitivity to change and responsiveness of four balance measures for community-dwelling older adults. Physical therapy 92(3): 388-397.   Item Test date: 11/28/2023 Date:  Date:   Sitting to standing 3. able to stand independently using hands Insert SmartPhrase OPRCBERGREEVAL Insert SmartPhrase OPRCBERGREEVAL  2. Standing unsupported 3. able to stand 2 minutes with supervision    3. Sitting with back unsupported, feet supported 4. able to sit safely and securely for 2 minutes    4. Standing to sitting 4. sits safely with minimal use of hands    5. Pivot transfer  3. able to transfer safely with definite need of hands    6. Standing unsupported with eyes closed 3. able to stand 10 seconds with supervision    7. Standing unsupported with feet together 0. needs help to attain position and unable to hold for 15 seconds    8. Reaching forward with outstretched arms while standing 0. loses balance while  trying/requires external support    9. Pick up object from the floor from standing 3. able to pick up slipper but needs supervision    10. Turning to look behind over left and right shoulders while standing 3. looks behind one side only, other side shows less weight shift    11. Turn 360 degrees 0. needs assistance while turning    12. Place alternate foot on step or stool while standing unsupported 0. needs assistance to keep from falling/unable to try    13.  Standing unsupported one foot in front 0. loses balance while stepping or standing    14. Standing on one leg 0. unable to try of needs assist to prevent fall      Total Score 26/56 Total Score:    Total Score:    Item Test date: 12/28/2023 Date:  Date:   Sitting to standing 3. able to stand independently using hands Insert SmartPhrase OPRCBERGREEVAL Insert SmartPhrase OPRCBERGREEVAL  2. Standing unsupported 4. able to stand safely for 2 minutes    3. Sitting with back unsupported, feet supported 4. able to sit safely and securely for 2 minutes    4. Standing to sitting 3. controls descent by using hands    5. Pivot transfer  3. able to transfer safely with definite need of hands    6. Standing unsupported with eyes closed 4. able to stand 10 seconds safely    7. Standing unsupported with feet together 0. needs help to attain position and unable to hold for 15 seconds    8. Reaching forward with outstretched arms while standing 2. can reach forward 5 cm (2 inches)    9. Pick up object from the floor from standing 3. able to pick up slipper but needs supervision    10. Turning to look behind over left and right shoulders while standing 3. looks behind one side only, other side shows less weight shift    11. Turn 360 degrees 0. needs assistance while turning    12. Place alternate foot on step or stool while standing unsupported 0. needs assistance to keep from falling/unable to try    13. Standing unsupported one foot in front 2. able to  take small step independently and hold 30 seconds    14. Standing on one leg 0. unable to try of needs assist to prevent fall      Total Score 31/56 Total Score:    Total Score:    Item Test date: 02/08/2024 Date:  Date:   Sitting to standing 3. able to stand independently using hands Insert SmartPhrase OPRCBERGREEVAL Insert SmartPhrase OPRCBERGREEVAL  2. Standing unsupported 4. able to stand safely for 2 minutes    3. Sitting with back unsupported, feet supported 4. able to sit safely and securely for 2 minutes    4. Standing to sitting 3. controls descent by using hands    5. Pivot transfer  3. able to transfer safely with definite need of hands    6. Standing unsupported with eyes closed 3. able to stand 10 seconds with supervision    7. Standing unsupported with feet together 3. able to place feet together independently and stand 1 minute with supervision    8. Reaching forward with outstretched arms while standing 2. can reach forward 5 cm (2 inches)    9. Pick up object from the floor from standing 0. unable to try/needs assistance to keep from losing balance or falling    10. Turning to look behind over left and right shoulders while standing 0. needs assistance to keep from losing balance or falling    11. Turn 360 degrees 0. needs assistance while turning    12. Place alternate foot on step or stool while standing unsupported 0. needs assistance to keep from falling/unable to try    13. Standing unsupported one foot in front 0. loses balance while stepping or standing    14. Standing on one leg 0. unable to try of needs assist to prevent fall  Total Score 25/56 Total Score:    Total Score:    Item Test date: 02/24/2024 Date:  Date:   Sitting to standing 3. able to stand independently using hands Insert SmartPhrase OPRCBERGREEVAL Insert SmartPhrase OPRCBERGREEVAL  2. Standing unsupported 3. able to stand 2 minutes with supervision    3. Sitting with back unsupported, feet supported  4. able to sit safely and securely for 2 minutes    4. Standing to sitting 3. controls descent by using hands    5. Pivot transfer  3. able to transfer safely with definite need of hands    6. Standing unsupported with eyes closed 3. able to stand 10 seconds with supervision    7. Standing unsupported with feet together 3. able to place feet together independently and stand 1 minute with supervision    8. Reaching forward with outstretched arms while standing 2. can reach forward 5 cm (2 inches)    9. Pick up object from the floor from standing 4. able to pick up slipper safely and easily    10. Turning to look behind over left and right shoulders while standing 3. looks behind one side only, other side shows less weight shift    11. Turn 360 degrees 2. able to turn 360 degrees safely but slowly    12. Place alternate foot on step or stool while standing unsupported 1. able to complete > 2 steps needs minimal assist    13. Standing unsupported one foot in front 0. loses balance while stepping or standing    14. Standing on one leg 0. unable to try of needs assist to prevent fall      Total Score 34/56 Total Score:    Total Score:      GAIT: Distance walked: 30 ft Assistive device utilized: Single point cane Level of assistance: Modified independence and SBA Comments: SPC on R, decreased stance R LE, forward flexed, decreased gait velocity.   TREATMENT DATE: 02/24/2023                                                                                                                                 Neuromuscular re education  Directed patient with sit <> stand throughout session Stand pivot transfer chair <> low mat table Static standing shoulder width apart, then with eyes closed, then with eyes open feet together, tandem stance with feet shoulder width apart Picking up an object (computer mouse) from the floor,   Turning 360 degrees to the R and to the L 1x Looking behind to the R and to  the L,    Able to weight shift onto L LE. Does not weight shift well onto R LE Standing forward reach,    Standing alternate toe taps 4 x each LE onto first regular step  Pt wearing more supportive tennis shoes today.    Seated manually resisted hip flexion, hip extension, hip abduction, knee flexion, knee extension  1x each way for each LE  Reviewed progress/current status with PT towards goals   Gait 30 ft with 180 degree turns 2x with R and L turns  Backwards walk with PT CGA 30 ft x 2, cues for increasing step length improved base of support.    Therapeutic rest breaks provided secondary to difficulty of certain exercises/task to allow his muscles to recover for the next activity/task/exercise.      Improved technique, movement at target joints, use of target muscles after mod verbal, visual, tactile cues.       PATIENT EDUCATION:  Education details: POC Person educated: Patient Education method: Explanation Education comprehension: verbalized understanding  HOME EXERCISE PROGRAM: Access Code: [email or web address] URL: [email or web address] Date: 11/08/2023 Prepared by: Emil Glassman  Exercises - Seated Hip Flexion  - 1 x daily - 7 x weekly - 3 sets - 5 reps - Narrow Stance with Counter Support  - 3 x daily - 7 x weekly - 1 sets - 5 reps - 30 seconds hold - Standing Gastroc Stretch at Counter  - 3 x daily - 7 x weekly - 1 sets - 3 reps - 1 minute hold  - Standing Single Leg Stance with Counter Support  - 1 x daily - 7 x weekly - 3 sets - 10 reps - 5 seconds hold  - Seated Ankle Plantar Flexion with Resistance Loop  - 1 x daily - 7 x weekly - 3 sets - 10 reps  Blue band.  - Lunge with Counter Support  - 1 x daily - 7 x weekly - 3 sets - 10 reps  - Seated Piriformis Stretch with Trunk Bend  - 2-3 x daily - 7 x weekly - 1 sets - 5 reps - 30 seconds to 1 minute hold - Seated Hip Internal Rotation AROM  - 1 x daily - 7 x weekly - 3 sets - 10 reps  - Sidelying Hip  Abduction  - 1 x daily - 7 x weekly - 2-3 sets - 5 reps - Alternating Step Backward with Support  - 1 x daily - 7 x weekly - 3 sets - 10 reps  Updated exercises 02/21/2024 Access Code: [email or web address] URL: [email or web address] Date: 02/21/2024 Prepared by: Emil Glassman  Exercises - Narrow Stance with Counter Support  - 3 x daily - 7 x weekly - 1 sets - 5 reps - 30 seconds hold - Standing Gastroc Stretch at Counter  - 3 x daily - 7 x weekly - 1 sets - 3 reps - 1 minute hold - Standing Single Leg Stance with Counter Support  - 1 x daily - 7 x weekly - 3 sets - 10 reps - 5 seconds hold - Seated Ankle Plantar Flexion with Resistance Loop  - 1 x daily - 7 x weekly - 3 sets - 10 reps  Blue band.   - Lunge with Counter Support  - 1 x daily - 7 x weekly - 3 sets - 10 reps - Sidelying Hip Abduction  - 1 x daily - 7 x weekly - 2-3 sets - 5 reps - Alternating Step Backward with Support  - 1 x daily - 7 x weekly - 3 sets - 10 reps    ASSESSMENT:  CLINICAL IMPRESSION: Pt able to perform single leg balance on L LE for 3 seconds and R LE for 2 seconds today unassisted prior to LOB. Pt demonstrates overall improving balance (BERG Balance test) and LE strength since initial evaluation. Pt was also wearing  more supportive shoes today which might have helped with balance tasks. Pt making slow progress with PT towards goals. Challenges to progress include age and medical history.  Pt tolerated session well without aggravation of symptoms. Pt will benefit from continued skilled physical therapy services to improve strength, balance and function.     OBJECTIVE IMPAIRMENTS: Abnormal gait, decreased balance, difficulty walking, decreased strength, improper body mechanics, and postural dysfunction.   ACTIVITY LIMITATIONS: carrying, lifting, bending, standing, squatting, stairs, transfers, toileting, dressing, and locomotion level  PARTICIPATION LIMITATIONS:   PERSONAL FACTORS: Age, Fitness,  Past/current experiences, Time since onset of injury/illness/exacerbation, and 3+ comorbidities: Cancer, HTN, arthritis are also affecting patient's functional outcome.   REHAB POTENTIAL: Fair    CLINICAL DECISION MAKING: Evolving/moderate complexity; balance is worsening per pt.   EVALUATION COMPLEXITY: Moderate   GOALS: Goals reviewed with patient? Yes  SHORT TERM GOALS: Target date: 10/28/2023  Pt will be independent with his initial HEP to improve LE strength, balance, and function.  Baseline: Pt has not yet started his initial HEP (10/20/2023); no questions (11/30/2023) Goal status: MET   LONG TERM GOALS: Target date: 04/20/2024  Pt will improve his Berg Balance Test score to at least 48/56 as a demonstration of improved balance and decreased need for AD.  Baseline: 29/56 (10/20/2023); 26/56 (11/28/2023); 31/56 (12/28/2023); 25/56 (02/08/2024); 34/56 (02/24/2024) Goal status: PROGRESSING  2.  Pt will improve his B LE strength by at least 1/2 MMT grade to promote ability to perform transfers and standing tasks with less difficulty and with improved balance.  Baseline:  MMT Right eval Left eval R 11/30/2023 L 11/30/2023 R 12/28/2023 L 12/28/2023  Hip flexion 4 3- 4 4- 4 4  Hip extension (seated manually resisted) 4- 4- 4+ 4+ 5 4+  Hip abduction (seated manually resisted) 4 4 4+ 4+ 4+ 5  Knee flexion 4+ 4 5 4+ 5 4  Knee extension 5 4+ 5 5 5 5    MMT Right eval Left eval R 02/08/2024 L 02/08/2024 R 02/24/2024 L 02/24/2024  Hip flexion 4 3- 4+ 4+ 4 4  Hip extension (seated manually resisted) 4- 4- 5 4+ 5 5  Hip abduction (seated manually resisted) 4 4 5 5 5 5   Knee flexion 4+ 4 5 4+ 5 4+  Knee extension 5 4+ 5 5 5 5     Goal status: Partially MET   3.  Pt will improve his Activities Specific Balance Confidence (ABC) scale by at least 15% as a demonstration of improved balance.  Baseline:  63.75% (9/18/025); 76% (11/30/2023); 77.5 %(02/24/2024) Goal status: PARTIALLY  MET   PLAN:  PT FREQUENCY: 1-2x/week  PT DURATION: 8 weeks  PLANNED INTERVENTIONS: 97110-Therapeutic exercises, 97530- Therapeutic activity, [id number]- Neuromuscular re-education, 97535- Self Care, [id number]- Manual therapy, [phone]- Gait training, Patient/Family education, Balance training, and Stair training.  PLAN FOR NEXT SESSION: posture, trunk, hip and knee strengthening, balance training, manual techniques, modalities PRN.    Cybele Maule, PT, DPT 02/24/2024, 11:04 AM  "

## 2024-02-27 ENCOUNTER — Ambulatory Visit

## 2024-02-28 ENCOUNTER — Ambulatory Visit

## 2024-02-28 ENCOUNTER — Inpatient Hospital Stay: Admitting: Licensed Clinical Social Worker

## 2024-02-28 ENCOUNTER — Inpatient Hospital Stay

## 2024-03-01 ENCOUNTER — Inpatient Hospital Stay

## 2024-03-01 ENCOUNTER — Encounter: Payer: Self-pay | Admitting: Licensed Clinical Social Worker

## 2024-03-01 ENCOUNTER — Inpatient Hospital Stay: Admitting: Licensed Clinical Social Worker

## 2024-03-01 ENCOUNTER — Other Ambulatory Visit: Payer: Self-pay | Admitting: Licensed Clinical Social Worker

## 2024-03-01 DIAGNOSIS — C61 Malignant neoplasm of prostate: Secondary | ICD-10-CM | POA: Diagnosis not present

## 2024-03-01 DIAGNOSIS — Z803 Family history of malignant neoplasm of breast: Secondary | ICD-10-CM

## 2024-03-01 DIAGNOSIS — Z1379 Encounter for other screening for genetic and chromosomal anomalies: Secondary | ICD-10-CM

## 2024-03-01 LAB — GENETIC SCREENING ORDER

## 2024-03-01 NOTE — Progress Notes (Signed)
 REFERRING PROVIDER: Babara Call, MD [address] [place],  [place] [address]  PRIMARY PROVIDER:  Diedra Lame, MD  PRIMARY REASON FOR VISIT:  1. Prostate cancer metastatic to multiple sites [facility])   2. Family history of breast cancer      HISTORY OF PRESENT ILLNESS:   Mr. Acy, a 79 y.o. male, was seen for a [place] cancer genetics consultation at the request of Dr. Babara due to a personal and family history of cancer.  Mr. Reali presents to clinic today to discuss the possibility of a hereditary predisposition to cancer, genetic testing, and to further clarify his future cancer risks, as well as potential cancer risks for family members.   CANCER HISTORY:  Mr. Guin was initially diagnosed with prostate cancer in 2019, stage IV. This was treated with adjuvant deprivation therapy and radiation. 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  Oncology History  Prostate cancer metastatic to multiple sites [facility])  06/03/2017 Imaging   CT abdomen pelvis showed 1. Development of bilateral pelvic sidewall adenopathy, consistent with nodal metastasis. 2.  Aortic Atherosclerosis (ICD10-I70.0). 3. Right-sided urinary bladder entering a right inguinal hernia, similar. 4. 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 At that time, biopsy showed Gleason 4+5 and 4+4 involving all cores on the right up to 96% of the prostate.  Staging indicated bilateral external iliac lymphadenopathy up to 3 cm on the left and 1.8 cm on the right.  Bone scan was negative.  Per neurology note, surgery option was discussed and patient declined.  Patient was started on adjuvant deprivation therapy, status post EBRT and he completed 2.5 years of ADT.  PSA had remained undetectable until 08/29/2023-PSA has  increased to 66.    09/01/2023 Imaging   Patient went to emergency room for evaluation of constipation and abdominal/lower back pain.  As part of the workup, patient had a CT chest abdomen pelvis done.  CT chest showed mildly enlarged bilateral hilar lymph node which are indeterminate.  CT abdomen pelvis with contrast showed Significantly enlarged left periaortic adenopathy, most prominently seen in the left periaortic region, bilateral nonobstructive nephrolithiasis, sigmoid diverticulosis without inflammation   09/05/2023 Cancer Staging   Staging form: Prostate, AJCC 8th Edition - Clinical stage from 09/05/2023: Stage IVB (rcTX, rcN1, rcM1, PSA: 75) - Signed by Babara Call, MD on 09/06/2023 Stage prefix: Recurrence Prostate specific antigen (PSA) range: 20 or greater   09/06/2023 Imaging   PSMA PET scan showed   1. Widespread tracer-avid osseous metastatic disease without corresponding sclerotic bone lesions. New from the prior PET-CT. 2. Multiple tracer-avid thoracic, abdominal, and pelvic lymph nodes. New from prior PET-CT. 3. Heterogeneous hepatic uptake with multiple geographic areas of relative increased radiotracer uptake, indeterminate for infiltrative hepatocellular disease. No discrete tracer-avid lesion identified. No suspicious liver lesions on CT from 09/01/2023.   09/09/2023 Procedure   Left supraclavicular lymph node biopsy showed metastatic adenocarcinoma.  Additional IHC staining showed the adenocarcinoma is positive with NKX3.1 and prostein while negative with prostate-specific antigen (PSA).  The immunohistochemical findings are consistent with metastatic prostate adenocarcinoma      Past Medical History:  Diagnosis Date   Anemia    Arthritis    Atherosclerosis of aorta 2018   BPH (benign prostatic hyperplasia)    Cancer (HCC) 2019   prostate   Chronic  kidney disease 08/2016   kidney stone   Diverticulosis of sigmoid colon    History of kidney stones     Hypertension    Leg cramps     Past Surgical History:  Procedure Laterality Date   APPENDECTOMY     EXTRACORPOREAL SHOCK WAVE LITHOTRIPSY Right 08/19/2016   Procedure: EXTRACORPOREAL SHOCK WAVE LITHOTRIPSY (ESWL);  Surgeon: Penne Knee, MD;  Location: [facility] ORS;  Service: Urology;  Laterality: Right;   EXTRACORPOREAL SHOCK WAVE LITHOTRIPSY Left 03/24/2017   Procedure: EXTRACORPOREAL SHOCK WAVE LITHOTRIPSY (ESWL);  Surgeon: Penne Knee, MD;  Location: [facility] ORS;  Service: Urology;  Laterality: Left;   EYE MUSCLE SURGERY     INGUINAL HERNIA REPAIR Right 12/23/2017   Direct hernia, medium Ultra Pro mesh;  Surgeon: Dessa Reyes ORN, MD;  Location: [facility] ORS;  Service: General;  Laterality: Right;   JOINT REPLACEMENT     KNEE ARTHROPLASTY Left 11/15/2016   Procedure: COMPUTER ASSISTED TOTAL KNEE ARTHROPLASTY;  Surgeon: Mardee Lynwood SQUIBB, MD;  Location: [facility] ORS;  Service: Orthopedics;  Laterality: Left;   TOTAL KNEE ARTHROPLASTY Right     FAMILY HISTORY:  We obtained a detailed, 4-generation family history.  Significant diagnoses are listed below:  Family History  Problem Relation Age of Onset   Breast cancer Mother 54   Non-Hodgkin's lymphoma Brother    Breast cancer Paternal Grandmother      Mother- breast cancer d. 26 Brother - Non-Hodgkin's lymphoma dx >50 Paternal grandmother - breast cancer dx >50    Mr. Ricci is unaware of previous family history of genetic testing for hereditary cancer risks. There is no reported Ashkenazi Jewish ancestry. There is no known consanguinity.  GENETIC COUNSELING ASSESSMENT: Mr. Kolbe is a 79 y.o. male with a personal history of metastatic prostate cancer which is somewhat suggestive of a hereditary cancer syndrome and predisposition to cancer. We, therefore, discussed and recommended the following at today's visit.   DISCUSSION: We discussed that, in general, most cancer is not inherited in families, but instead is sporadic or familial. We  discussed that approximately 10% of prostate cancer is hereditary. Most cases of hereditary prostate cancer are associated with BRCA1/BRCA2 genes, although there are other genes associated with hereditary cancer as well. Cancers and risks are gene specific. We discussed that testing is beneficial for several reasons including knowing about cancer risks, identifying potential screening and risk-reduction options that may be appropriate, and to understand if other family members could be at risk for cancer and allow them to undergo genetic testing.   We reviewed the characteristics, features and inheritance patterns of hereditary cancer syndromes. We also discussed genetic testing, including the appropriate family members to test, the process of testing, insurance coverage and turn-around-time for results. We discussed the implications of a negative, positive and/or variant of uncertain significant result. We recommended Mr. Steinberger pursue genetic testing for the [facility] Common Hereditary Cancers+RNA gene panel.   The Common Hereditary Cancers Panel + RNA offered by [facility] includes sequencing and/or deletion duplication testing of the following 48 genes: APC*, ATM*, AXIN2, BAP1, BARD1, BMPR1A, BRCA1, BRCA2, BRIP1, CDH1, CDK4, CDKN2A (p14ARF), CDKN2A (p16INK4a), CHEK2, CTNNA1, DICER1*, EPCAM*, FH*, GREM1*, HOXB13, KIT, MBD4, MEN1*, MLH1*, MSH2*, MSH3*, MSH6*, MUTYH, NF1*, NTHL1, PALB2, PDGFRA, PMS2*, POLD1*, POLE, PTEN*, RAD51C, RAD51D, SDHA*, SDHB, SDHC*, SDHD, SMAD4, SMARCA4, STK11, TP53, TSC1*, TSC2, VHL.   Based on Mr. Castagna's personal and family history of cancer, he meets medical criteria for genetic testing. Despite that he meets criteria, he may still  have an out of pocket cost.  PLAN: After considering the risks, benefits, and limitations, Mr. Abdullah provided informed consent to pursue genetic testing and the blood sample was sent to [facility] for analysis of the Common Hereditary Cancers+RNA.  Results should be available within approximately 2-3 weeks' time, at which point they will be disclosed by telephone to Mr. Galindez, as will any additional recommendations warranted by these results. Mr. Rowser will receive a summary of his genetic counseling visit and a copy of his results once available. This information will also be available in Epic.   Mr. Stroupe questions were answered to his satisfaction today. Our contact information was provided should additional questions or concerns arise. Thank you for the referral and allowing us  to share in the care of your patient.   Dena Cary, MS, [facility] Genetic Counselor [email or web address] Phone: [phone]  I personally spent a total of 45 minutes in the care of the patient today including getting/reviewing separately obtained history, counseling and educating, placing orders, and documenting clinical information in the EHR.  Dr. Delinda was available for discussion regarding this case.   _______________________________________________________________________ For Office Staff:  Number of people involved in session: 1, patient's wife Niels present via phone.  Was an Intern/ student involved with case: no

## 2024-03-02 ENCOUNTER — Ambulatory Visit

## 2024-03-02 DIAGNOSIS — R2681 Unsteadiness on feet: Secondary | ICD-10-CM

## 2024-03-02 DIAGNOSIS — M6281 Muscle weakness (generalized): Secondary | ICD-10-CM

## 2024-03-02 DIAGNOSIS — Z9181 History of falling: Secondary | ICD-10-CM

## 2024-03-06 ENCOUNTER — Ambulatory Visit

## 2024-03-06 DIAGNOSIS — Z9181 History of falling: Secondary | ICD-10-CM

## 2024-03-06 DIAGNOSIS — R2681 Unsteadiness on feet: Secondary | ICD-10-CM

## 2024-03-06 DIAGNOSIS — M6281 Muscle weakness (generalized): Secondary | ICD-10-CM

## 2024-03-08 ENCOUNTER — Other Ambulatory Visit: Payer: Self-pay | Admitting: Oncology

## 2024-03-08 ENCOUNTER — Other Ambulatory Visit: Payer: Self-pay | Admitting: Hospice and Palliative Medicine

## 2024-03-08 ENCOUNTER — Other Ambulatory Visit: Payer: Self-pay

## 2024-03-08 ENCOUNTER — Other Ambulatory Visit (HOSPITAL_COMMUNITY): Payer: Self-pay

## 2024-03-08 DIAGNOSIS — C61 Malignant neoplasm of prostate: Secondary | ICD-10-CM

## 2024-03-08 MED ORDER — NUBEQA 300 MG PO TABS
600.0000 mg | ORAL_TABLET | Freq: Two times a day (BID) | ORAL | 1 refills | Status: AC
  Filled 2024-03-08 – 2024-03-09 (×2): qty 120, 30d supply, fill #0

## 2024-03-09 ENCOUNTER — Other Ambulatory Visit: Payer: Self-pay

## 2024-03-09 NOTE — Progress Notes (Signed)
 Clinical Intervention Note  Clinical Intervention Notes: Patient reported that he has started taking hydrocodone -acetaminophen . No DDIs identified with Nubeqa .   Clinical Intervention Outcomes: Prevention of an adverse drug event   Advertising Account Planner

## 2024-03-09 NOTE — Progress Notes (Signed)
 Specialty Pharmacy Ongoing Clinical Assessment Note  Leonard Stokes is a 79 y.o. male who is being followed by the specialty pharmacy service for RxSp Oncology   Patient's specialty medication(s) reviewed today: Darolutamide  (Nubeqa )   Missed doses in the last 4 weeks: 2   Patient/Caregiver did not have any additional questions or concerns.   Therapeutic benefit summary: Patient is achieving benefit   Adverse events/side effects summary: No adverse events/side effects   Patient's therapy is appropriate to: Continue    Goals Addressed             This Visit's Progress    Slow Disease Progression   On track    Patient is on track. Patient will maintain adherence. PSA is now down to 0.07 ng/mL as of 02/08/24.           Follow up: 3 months  [facility]

## 2024-03-09 NOTE — Progress Notes (Signed)
 Specialty Pharmacy Refill Coordination Note  Leonard Stokes is a 79 y.o. male contacted today regarding refills of specialty medication(s) Darolutamide  (Nubeqa )   Patient requested Delivery   Delivery date: 03/15/24   Verified address: [address] [place] [address]   Medication will be filled on: 03/14/24

## 2024-03-15 ENCOUNTER — Ambulatory Visit

## 2024-03-20 ENCOUNTER — Ambulatory Visit

## 2024-03-22 ENCOUNTER — Ambulatory Visit

## 2024-03-27 ENCOUNTER — Ambulatory Visit: Admitting: Physician Assistant

## 2024-03-27 ENCOUNTER — Ambulatory Visit

## 2024-03-29 ENCOUNTER — Ambulatory Visit

## 2024-04-03 ENCOUNTER — Ambulatory Visit

## 2024-04-05 ENCOUNTER — Ambulatory Visit

## 2024-04-10 ENCOUNTER — Ambulatory Visit

## 2024-04-12 ENCOUNTER — Ambulatory Visit

## 2024-04-17 ENCOUNTER — Ambulatory Visit

## 2024-04-19 ENCOUNTER — Ambulatory Visit

## 2024-04-24 ENCOUNTER — Ambulatory Visit

## 2024-04-26 ENCOUNTER — Ambulatory Visit

## 2024-05-01 ENCOUNTER — Ambulatory Visit

## 2024-05-16 ENCOUNTER — Inpatient Hospital Stay

## 2024-05-16 ENCOUNTER — Inpatient Hospital Stay: Admitting: Oncology

## 2024-05-17 ENCOUNTER — Inpatient Hospital Stay: Admitting: Hospice and Palliative Medicine
